# Patient Record
Sex: Female | Born: 1950 | Race: Black or African American | Hispanic: No | Smoking: Never smoker
Health system: Southern US, Community
[De-identification: ages and names within clinical notes are randomized; demographics above are authoritative.]

## PROBLEM LIST (undated history)

## (undated) DIAGNOSIS — J45909 Unspecified asthma, uncomplicated: Secondary | ICD-10-CM

## (undated) DIAGNOSIS — J309 Allergic rhinitis, unspecified: Secondary | ICD-10-CM

## (undated) DIAGNOSIS — I10 Essential (primary) hypertension: Secondary | ICD-10-CM

## (undated) DIAGNOSIS — F411 Generalized anxiety disorder: Secondary | ICD-10-CM

## (undated) DIAGNOSIS — E119 Type 2 diabetes mellitus without complications: Secondary | ICD-10-CM

## (undated) DIAGNOSIS — N959 Unspecified menopausal and perimenopausal disorder: Secondary | ICD-10-CM

## (undated) DIAGNOSIS — G4733 Obstructive sleep apnea (adult) (pediatric): Secondary | ICD-10-CM

## (undated) DIAGNOSIS — R32 Unspecified urinary incontinence: Secondary | ICD-10-CM

## (undated) DIAGNOSIS — F3289 Other specified depressive episodes: Secondary | ICD-10-CM

## (undated) DIAGNOSIS — E785 Hyperlipidemia, unspecified: Secondary | ICD-10-CM

## (undated) DIAGNOSIS — G478 Other sleep disorders: Secondary | ICD-10-CM

## (undated) DIAGNOSIS — G43909 Migraine, unspecified, not intractable, without status migrainosus: Secondary | ICD-10-CM

## (undated) DIAGNOSIS — F329 Major depressive disorder, single episode, unspecified: Secondary | ICD-10-CM

## (undated) DIAGNOSIS — N390 Urinary tract infection, site not specified: Secondary | ICD-10-CM

## (undated) DIAGNOSIS — M1990 Unspecified osteoarthritis, unspecified site: Secondary | ICD-10-CM

## (undated) DIAGNOSIS — F410 Panic disorder [episodic paroxysmal anxiety] without agoraphobia: Secondary | ICD-10-CM

## (undated) DIAGNOSIS — I509 Heart failure, unspecified: Secondary | ICD-10-CM

## (undated) HISTORY — DX: Generalized anxiety disorder: F41.1

## (undated) HISTORY — DX: Essential (primary) hypertension: I10

## (undated) HISTORY — DX: Hyperlipidemia, unspecified: E78.5

## (undated) HISTORY — DX: Major depressive disorder, single episode, unspecified: F32.9

## (undated) HISTORY — DX: Unspecified menopausal and perimenopausal disorder: N95.9

## (undated) HISTORY — PX: DIAGNOSTIC LAPAROSCOPY: SUR761

## (undated) HISTORY — DX: Unspecified osteoarthritis, unspecified site: M19.90

## (undated) HISTORY — PX: TUBAL LIGATION: SHX77

## (undated) HISTORY — DX: Migraine, unspecified, not intractable, without status migrainosus: G43.909

## (undated) HISTORY — DX: Obstructive sleep apnea (adult) (pediatric): G47.33

## (undated) HISTORY — DX: Urinary tract infection, site not specified: N39.0

## (undated) HISTORY — DX: Unspecified asthma, uncomplicated: J45.909

## (undated) HISTORY — DX: Other sleep disorders: G47.8

## (undated) HISTORY — DX: Allergic rhinitis, unspecified: J30.9

## (undated) HISTORY — PX: OPEN REDUCTION SHOULDER DISLOCATION: SUR900

## (undated) HISTORY — DX: Other specified depressive episodes: F32.89

## (undated) HISTORY — PX: TUMOR EXCISION: SHX421

## (undated) HISTORY — DX: Unspecified urinary incontinence: R32

---

## 1997-12-13 ENCOUNTER — Other Ambulatory Visit: Admission: RE | Admit: 1997-12-13 | Discharge: 1997-12-13 | Payer: Self-pay | Admitting: Internal Medicine

## 1998-03-01 ENCOUNTER — Encounter (HOSPITAL_COMMUNITY): Admission: RE | Admit: 1998-03-01 | Discharge: 1998-05-30 | Payer: Self-pay | Admitting: Neurology

## 1998-05-12 ENCOUNTER — Emergency Department (HOSPITAL_COMMUNITY): Admission: EM | Admit: 1998-05-12 | Discharge: 1998-05-12 | Payer: Self-pay | Admitting: Emergency Medicine

## 1998-05-27 ENCOUNTER — Emergency Department (HOSPITAL_COMMUNITY): Admission: EM | Admit: 1998-05-27 | Discharge: 1998-05-27 | Payer: Self-pay | Admitting: Emergency Medicine

## 1998-07-06 ENCOUNTER — Emergency Department (HOSPITAL_COMMUNITY): Admission: EM | Admit: 1998-07-06 | Discharge: 1998-07-06 | Payer: Self-pay | Admitting: Emergency Medicine

## 1998-07-28 ENCOUNTER — Emergency Department (HOSPITAL_COMMUNITY): Admission: EM | Admit: 1998-07-28 | Discharge: 1998-07-28 | Payer: Self-pay | Admitting: Emergency Medicine

## 1998-09-19 ENCOUNTER — Emergency Department (HOSPITAL_COMMUNITY): Admission: EM | Admit: 1998-09-19 | Discharge: 1998-09-19 | Payer: Self-pay | Admitting: Emergency Medicine

## 1998-10-20 ENCOUNTER — Emergency Department (HOSPITAL_COMMUNITY): Admission: EM | Admit: 1998-10-20 | Discharge: 1998-10-20 | Payer: Self-pay | Admitting: Emergency Medicine

## 1998-11-15 ENCOUNTER — Emergency Department (HOSPITAL_COMMUNITY): Admission: EM | Admit: 1998-11-15 | Discharge: 1998-11-15 | Payer: Self-pay | Admitting: Emergency Medicine

## 1998-12-23 ENCOUNTER — Emergency Department (HOSPITAL_COMMUNITY): Admission: EM | Admit: 1998-12-23 | Discharge: 1998-12-23 | Payer: Self-pay | Admitting: Emergency Medicine

## 1999-02-05 ENCOUNTER — Emergency Department (HOSPITAL_COMMUNITY): Admission: EM | Admit: 1999-02-05 | Discharge: 1999-02-05 | Payer: Self-pay | Admitting: Emergency Medicine

## 1999-02-16 ENCOUNTER — Other Ambulatory Visit: Admission: RE | Admit: 1999-02-16 | Discharge: 1999-02-16 | Payer: Self-pay | Admitting: Internal Medicine

## 1999-03-07 ENCOUNTER — Emergency Department (HOSPITAL_COMMUNITY): Admission: EM | Admit: 1999-03-07 | Discharge: 1999-03-07 | Payer: Self-pay | Admitting: Emergency Medicine

## 1999-08-11 ENCOUNTER — Emergency Department (HOSPITAL_COMMUNITY): Admission: EM | Admit: 1999-08-11 | Discharge: 1999-08-11 | Payer: Self-pay | Admitting: Emergency Medicine

## 1999-11-18 ENCOUNTER — Emergency Department (HOSPITAL_COMMUNITY): Admission: EM | Admit: 1999-11-18 | Discharge: 1999-11-18 | Payer: Self-pay | Admitting: Emergency Medicine

## 2000-02-01 ENCOUNTER — Emergency Department (HOSPITAL_COMMUNITY): Admission: EM | Admit: 2000-02-01 | Discharge: 2000-02-01 | Payer: Self-pay | Admitting: Emergency Medicine

## 2000-05-06 ENCOUNTER — Emergency Department (HOSPITAL_COMMUNITY): Admission: EM | Admit: 2000-05-06 | Discharge: 2000-05-07 | Payer: Self-pay | Admitting: Emergency Medicine

## 2000-07-01 ENCOUNTER — Emergency Department (HOSPITAL_COMMUNITY): Admission: EM | Admit: 2000-07-01 | Discharge: 2000-07-01 | Payer: Self-pay | Admitting: Emergency Medicine

## 2000-07-01 HISTORY — PX: ABDOMINAL HYSTERECTOMY: SHX81

## 2000-10-08 ENCOUNTER — Emergency Department (HOSPITAL_COMMUNITY): Admission: EM | Admit: 2000-10-08 | Discharge: 2000-10-08 | Payer: Self-pay | Admitting: Emergency Medicine

## 2001-01-08 ENCOUNTER — Encounter (INDEPENDENT_AMBULATORY_CARE_PROVIDER_SITE_OTHER): Payer: Self-pay | Admitting: *Deleted

## 2001-01-08 ENCOUNTER — Other Ambulatory Visit: Admission: RE | Admit: 2001-01-08 | Discharge: 2001-01-08 | Payer: Self-pay | Admitting: Obstetrics and Gynecology

## 2001-01-19 ENCOUNTER — Emergency Department (HOSPITAL_COMMUNITY): Admission: EM | Admit: 2001-01-19 | Discharge: 2001-01-19 | Payer: Self-pay | Admitting: Internal Medicine

## 2001-02-18 ENCOUNTER — Encounter: Payer: Self-pay | Admitting: Obstetrics and Gynecology

## 2001-02-25 ENCOUNTER — Inpatient Hospital Stay (HOSPITAL_COMMUNITY): Admission: RE | Admit: 2001-02-25 | Discharge: 2001-03-03 | Payer: Self-pay | Admitting: Obstetrics and Gynecology

## 2001-02-25 ENCOUNTER — Encounter (INDEPENDENT_AMBULATORY_CARE_PROVIDER_SITE_OTHER): Payer: Self-pay | Admitting: Specialist

## 2001-02-28 ENCOUNTER — Encounter: Payer: Self-pay | Admitting: Obstetrics and Gynecology

## 2001-03-01 ENCOUNTER — Encounter: Payer: Self-pay | Admitting: Obstetrics and Gynecology

## 2001-12-22 ENCOUNTER — Encounter: Payer: Self-pay | Admitting: Orthopedic Surgery

## 2001-12-22 ENCOUNTER — Ambulatory Visit (HOSPITAL_COMMUNITY): Admission: RE | Admit: 2001-12-22 | Discharge: 2001-12-22 | Payer: Self-pay | Admitting: Orthopedic Surgery

## 2002-02-11 ENCOUNTER — Emergency Department (HOSPITAL_COMMUNITY): Admission: EM | Admit: 2002-02-11 | Discharge: 2002-02-11 | Payer: Self-pay | Admitting: Emergency Medicine

## 2002-03-13 ENCOUNTER — Emergency Department (HOSPITAL_COMMUNITY): Admission: EM | Admit: 2002-03-13 | Discharge: 2002-03-13 | Payer: Self-pay | Admitting: Emergency Medicine

## 2002-03-16 ENCOUNTER — Ambulatory Visit (HOSPITAL_BASED_OUTPATIENT_CLINIC_OR_DEPARTMENT_OTHER): Admission: RE | Admit: 2002-03-16 | Discharge: 2002-03-17 | Payer: Self-pay | Admitting: Orthopedic Surgery

## 2002-03-16 ENCOUNTER — Encounter (INDEPENDENT_AMBULATORY_CARE_PROVIDER_SITE_OTHER): Payer: Self-pay | Admitting: *Deleted

## 2002-06-19 ENCOUNTER — Emergency Department (HOSPITAL_COMMUNITY): Admission: EM | Admit: 2002-06-19 | Discharge: 2002-06-19 | Payer: Self-pay | Admitting: *Deleted

## 2002-06-22 ENCOUNTER — Other Ambulatory Visit: Admission: RE | Admit: 2002-06-22 | Discharge: 2002-06-22 | Payer: Self-pay | Admitting: Obstetrics and Gynecology

## 2002-07-06 ENCOUNTER — Encounter: Payer: Self-pay | Admitting: Obstetrics and Gynecology

## 2002-07-06 ENCOUNTER — Encounter: Admission: RE | Admit: 2002-07-06 | Discharge: 2002-07-06 | Payer: Self-pay | Admitting: Internal Medicine

## 2002-08-26 ENCOUNTER — Inpatient Hospital Stay (HOSPITAL_COMMUNITY): Admission: EM | Admit: 2002-08-26 | Discharge: 2002-08-28 | Payer: Self-pay | Admitting: Emergency Medicine

## 2002-08-26 ENCOUNTER — Encounter: Payer: Self-pay | Admitting: Internal Medicine

## 2002-08-26 DIAGNOSIS — Z9189 Other specified personal risk factors, not elsewhere classified: Secondary | ICD-10-CM | POA: Insufficient documentation

## 2003-02-14 ENCOUNTER — Emergency Department (HOSPITAL_COMMUNITY): Admission: EM | Admit: 2003-02-14 | Discharge: 2003-02-14 | Payer: Self-pay | Admitting: *Deleted

## 2003-04-25 ENCOUNTER — Encounter: Payer: Self-pay | Admitting: *Deleted

## 2003-04-25 ENCOUNTER — Emergency Department (HOSPITAL_COMMUNITY): Admission: AD | Admit: 2003-04-25 | Discharge: 2003-04-25 | Payer: Self-pay | Admitting: Emergency Medicine

## 2003-11-03 ENCOUNTER — Emergency Department (HOSPITAL_COMMUNITY): Admission: EM | Admit: 2003-11-03 | Discharge: 2003-11-03 | Payer: Self-pay | Admitting: Emergency Medicine

## 2004-05-11 ENCOUNTER — Emergency Department (HOSPITAL_COMMUNITY): Admission: EM | Admit: 2004-05-11 | Discharge: 2004-05-11 | Payer: Self-pay | Admitting: *Deleted

## 2004-07-12 ENCOUNTER — Emergency Department (HOSPITAL_COMMUNITY): Admission: EM | Admit: 2004-07-12 | Discharge: 2004-07-12 | Payer: Self-pay | Admitting: Emergency Medicine

## 2004-09-02 ENCOUNTER — Emergency Department (HOSPITAL_COMMUNITY): Admission: EM | Admit: 2004-09-02 | Discharge: 2004-09-02 | Payer: Self-pay | Admitting: Emergency Medicine

## 2004-11-09 ENCOUNTER — Emergency Department (HOSPITAL_COMMUNITY): Admission: EM | Admit: 2004-11-09 | Discharge: 2004-11-09 | Payer: Self-pay | Admitting: Emergency Medicine

## 2005-02-15 ENCOUNTER — Encounter: Admission: RE | Admit: 2005-02-15 | Discharge: 2005-02-15 | Payer: Self-pay | Admitting: Internal Medicine

## 2005-02-15 ENCOUNTER — Emergency Department (HOSPITAL_COMMUNITY): Admission: EM | Admit: 2005-02-15 | Discharge: 2005-02-15 | Payer: Self-pay | Admitting: Emergency Medicine

## 2005-03-12 ENCOUNTER — Inpatient Hospital Stay (HOSPITAL_COMMUNITY): Admission: AD | Admit: 2005-03-12 | Discharge: 2005-03-15 | Payer: Self-pay | Admitting: Internal Medicine

## 2005-05-14 ENCOUNTER — Emergency Department (HOSPITAL_COMMUNITY): Admission: EM | Admit: 2005-05-14 | Discharge: 2005-05-14 | Payer: Self-pay | Admitting: Emergency Medicine

## 2005-07-26 ENCOUNTER — Emergency Department (HOSPITAL_COMMUNITY): Admission: EM | Admit: 2005-07-26 | Discharge: 2005-07-26 | Payer: Self-pay | Admitting: Emergency Medicine

## 2005-12-21 ENCOUNTER — Emergency Department (HOSPITAL_COMMUNITY): Admission: EM | Admit: 2005-12-21 | Discharge: 2005-12-21 | Payer: Self-pay | Admitting: Emergency Medicine

## 2006-08-17 ENCOUNTER — Emergency Department (HOSPITAL_COMMUNITY): Admission: EM | Admit: 2006-08-17 | Discharge: 2006-08-17 | Payer: Self-pay | Admitting: Emergency Medicine

## 2006-10-03 ENCOUNTER — Emergency Department (HOSPITAL_COMMUNITY): Admission: EM | Admit: 2006-10-03 | Discharge: 2006-10-03 | Payer: Self-pay | Admitting: Emergency Medicine

## 2009-09-25 ENCOUNTER — Encounter: Payer: Self-pay | Admitting: Internal Medicine

## 2009-09-25 ENCOUNTER — Emergency Department (HOSPITAL_COMMUNITY)
Admission: EM | Admit: 2009-09-25 | Discharge: 2009-09-25 | Payer: Self-pay | Source: Home / Self Care | Admitting: Emergency Medicine

## 2009-09-25 LAB — CONVERTED CEMR LAB
ALT: 20 units/L
AST: 18 units/L
Albumin: 4.3 g/dL
Alkaline Phosphatase: 106 units/L
BUN: 10 mg/dL
CO2: 25 meq/L
Calcium: 10 mg/dL
Chloride: 102 meq/L
Creatinine, Ser: 0.81 mg/dL
Glucose, Bld: 133 mg/dL
HCT: 39 %
Hemoglobin: 13.4 g/dL
MCV: 89 fL
Platelets: 345 10*3/uL
Potassium: 3.2 meq/L
RBC: 4.38 M/uL
Sodium: 138 meq/L
Total Bilirubin: 0.7 mg/dL
Total Protein: 9.2 g/dL
WBC: 13.6 10*3/uL

## 2010-01-25 ENCOUNTER — Encounter: Payer: Self-pay | Admitting: Internal Medicine

## 2010-01-25 LAB — CONVERTED CEMR LAB
ALT: 12 units/L
AST: 17 units/L
Albumin: 4.4 g/dL
Alkaline Phosphatase: 101 units/L
BUN: 22 mg/dL
CO2: 22 meq/L
Chloride: 98 meq/L
Cholesterol: 180 mg/dL
Creatinine, Ser: 1.16 mg/dL
Glucose, Bld: 106 mg/dL
HCT: 34.5 %
HDL: 38 mg/dL
Hemoglobin: 11.7 g/dL
Platelets: 416 10*3/uL
Potassium: 3.8 meq/L
Sodium: 137 meq/L
TSH: 2.14 microintl units/mL
Total Bilirubin: 0.3 mg/dL
Triglyceride fasting, serum: 558 mg/dL

## 2010-06-14 ENCOUNTER — Encounter: Payer: Self-pay | Admitting: Internal Medicine

## 2010-06-14 DIAGNOSIS — I10 Essential (primary) hypertension: Secondary | ICD-10-CM | POA: Insufficient documentation

## 2010-06-14 DIAGNOSIS — F329 Major depressive disorder, single episode, unspecified: Secondary | ICD-10-CM | POA: Insufficient documentation

## 2010-06-14 DIAGNOSIS — G43909 Migraine, unspecified, not intractable, without status migrainosus: Secondary | ICD-10-CM | POA: Insufficient documentation

## 2010-06-14 DIAGNOSIS — F411 Generalized anxiety disorder: Secondary | ICD-10-CM | POA: Insufficient documentation

## 2010-06-18 ENCOUNTER — Ambulatory Visit: Payer: Self-pay | Admitting: Internal Medicine

## 2010-06-18 DIAGNOSIS — Z9189 Other specified personal risk factors, not elsewhere classified: Secondary | ICD-10-CM | POA: Insufficient documentation

## 2010-06-18 DIAGNOSIS — R519 Headache, unspecified: Secondary | ICD-10-CM | POA: Insufficient documentation

## 2010-06-18 DIAGNOSIS — R32 Unspecified urinary incontinence: Secondary | ICD-10-CM | POA: Insufficient documentation

## 2010-06-18 DIAGNOSIS — Z87448 Personal history of other diseases of urinary system: Secondary | ICD-10-CM | POA: Insufficient documentation

## 2010-06-18 DIAGNOSIS — E785 Hyperlipidemia, unspecified: Secondary | ICD-10-CM | POA: Insufficient documentation

## 2010-06-18 DIAGNOSIS — R51 Headache: Secondary | ICD-10-CM | POA: Insufficient documentation

## 2010-06-18 DIAGNOSIS — M129 Arthropathy, unspecified: Secondary | ICD-10-CM | POA: Insufficient documentation

## 2010-07-09 ENCOUNTER — Encounter (INDEPENDENT_AMBULATORY_CARE_PROVIDER_SITE_OTHER): Payer: Self-pay | Admitting: *Deleted

## 2010-07-23 ENCOUNTER — Ambulatory Visit
Admission: RE | Admit: 2010-07-23 | Discharge: 2010-07-23 | Payer: Self-pay | Source: Home / Self Care | Attending: Internal Medicine | Admitting: Internal Medicine

## 2010-08-02 NOTE — Letter (Signed)
Summary: Referral - not able to see patient  [facility]  [address] [place], [place] [address]   Phone: [phone]  Fax: [phone]    July 09, 2010   Rene Paci, MD [address] [place], [place] [address]   Re:   Gloria [name] DOB:  Feb 27, 1951 MRN:   [id number]    Dear Dr. Felicity Coyer:  Thank you for your kind referral of the above patient.  We have attempted to schedule the recommended procedure Screening Colonoscopy but have not been able to schedule because:  ___ The patient was not available by phone and/or has not returned our calls.  X   The patient declined to schedule the procedure at this time.  We appreciate the referral and hope that we will have the opportunity to treat this patient in the future.    Sincerely,    [facility] [phone]

## 2010-08-02 NOTE — Assessment & Plan Note (Signed)
Summary: 4-6 WK ROV/NWS   Vital Signs:  Patient profile:   60 year old female Height:      67 inches (170.18 cm) Weight:      204.50 pounds (92.95 kg) BMI:     32.15 O2 Sat:      98 % on Room air Temp:     98.8 degrees F (37.11 degrees C) oral Pulse rate:   100 / minute BP sitting:   122 / 78  (left arm) Cuff size:   large  Vitals Entered By: Brenton Grills CMA [name] Dull) (July 23, 2010 11:07 AM)  O2 Flow:  Room air CC: Follow-up visit/anxiety attacks/pt states she never started Sertraline/aj Is Patient Diabetic? No   Primary Care Provider:  Newt Lukes MD  CC:  Follow-up visit/anxiety attacks/pt states she never started Sertraline/aj.  History of Present Illness: here for f/u  1) HTN - resumed med tx 05/2010 (prev noncompl due to $ restraints) -reports compliance with ongoing medical treatment and no changes in medication dose or frequency. denies adverse side effects related to current therapy.  no CP, HA or edema -   2) anxiety - uses daily BZ for same, no other daily med rx'd - prev on wellbutrin and paxil but ineffective -  "i'm not crazy, just stressed"; +painic attacks and phobias; denies depression - denies SI/HI - has been to counseling at [facility] - has chosen not to start sertraline and unable to waen off BZ  3) migraines - exac by stress and anxiety - uses occ hydrocodone for control of same -   4) dyslipidemia - never rx'd med for same but advised low fat diet - not following diet or exercise paln   Clinical Review Panels:  CBC   WBC:  13.6 (09/25/2009)   RBC:  4.38 (09/25/2009)   Hgb:  13.4 (09/25/2009)   Hct:  39.0 (09/25/2009)   Platelets:  345 (09/25/2009)   MCV  89.0 (09/25/2009)  Complete Metabolic Panel   Glucose:  133 (09/25/2009)   Sodium:  138 (09/25/2009)   Potassium:  3.2 (09/25/2009)   Chloride:  102 (09/25/2009)   CO2:  25 (09/25/2009)   BUN:  10 (09/25/2009)   Creatinine:  0.81 (09/25/2009)   Albumin:  4.3 (09/25/2009)   Total Protein:  9.2 (09/25/2009)   Calcium:  10.0 (09/25/2009)   Total Bili:  0.7 (09/25/2009)   Alk Phos:  106 (09/25/2009)   SGPT (ALT):  20 (09/25/2009)   SGOT (AST):  18 (09/25/2009)   Current Medications (verified): 1)  Alprazolam 1 Mg Tabs (Alprazolam) .... Take 1-2 By Mouth Once Daily As Needed 2)  Hydrocodone-Acetaminophen 5-500 Mg Tabs (Hydrocodone-Acetaminophen) .... Take 1-2 By Mouth Once Daily As Needed 3)  Lisinopril-Hydrochlorothiazide 20-25 Mg Tabs (Lisinopril-Hydrochlorothiazide) .Marland Kitchen.. 1 By Mouth Once Daily 4)  Sertraline Hcl 25 Mg Tabs (Sertraline Hcl) .Marland Kitchen.. 1 By Mouth Once Daily  Allergies (verified): 1)  ! Codeine  Past History:  Past Medical History: Anxiety/Depression Hypertension Hyperlipidemia  Review of Systems  The patient denies peripheral edema, headaches, abdominal pain, and depression.    Physical Exam  General:  overweight-appearing.  alert, well-developed, well-nourished, and cooperative to examination.    Lungs:  normal respiratory effort, no intercostal retractions or use of accessory muscles; normal breath sounds bilaterally - no crackles and no wheezes.    Heart:  normal rate, regular rhythm, no murmur, and no rub. BLE without edema. normal DP pulses and normal cap refill in all 4 extremities  Psych:  Oriented X3, memory intact for recent and remote, normally interactive, good eye contact, mod anxious appearing, mildly depressed appearing, and not agitated.      Impression & Recommendations:  Problem # 1:  ANXIETY (ICD-300.00)  The following medications were removed from the medication list:    Sertraline Hcl 25 Mg Tabs (Sertraline hcl) .Marland Kitchen... 1 by mouth once daily Her updated medication list for this problem includes:    Alprazolam 1 Mg Tabs (Alprazolam) .Marland Kitchen... Take 1 by mouth three times a day as needed for anxiety and panic symptoms  pt wants to work on reduction of BZ use, but unable to do so at this time due to stressors prev  advised 2x/d as needed - then future 1x/d to help wean off same- but cont 3x/day at this time continue counseling as she has begun at [facility] - declines to start daily SSRI as sertraline rx'd 05/2010 after risk/benefits discussed -  controlled subst contract signed 05/2010  Problem # 2:  HYPERTENSION (ICD-401.9)  Her updated medication list for this problem includes:    Lisinopril-hydrochlorothiazide 20-25 Mg Tabs (Lisinopril-hydrochlorothiazide) .Marland Kitchen... 1 by mouth once daily  started $4 med 05/2010 including diuretic as pt concerned about "water weight" though no edema on exam - improved - cont same  BP today: 122/78 Prior BP: 142/82 (06/18/2010)  Labs Reviewed: K+: 3.2 (09/25/2009) Creat: : 0.81 (09/25/2009)     Complete Medication List: 1)  Alprazolam 1 Mg Tabs (Alprazolam) .... Take 1 by mouth three times a day as needed for anxiety and panic symptoms 2)  Hydrocodone-acetaminophen 5-500 Mg Tabs (Hydrocodone-acetaminophen) .... Take 1-2 by mouth once daily as needed 3)  Lisinopril-hydrochlorothiazide 20-25 Mg Tabs (Lisinopril-hydrochlorothiazide) .Marland Kitchen.. 1 by mouth once daily  Patient Instructions: 1)  it was good to see you today. 2)  continue $4 medications for blood pressure as discussed - see below - your refillss have been electronically submitted to your pharmacy ([facility]). Please take as directed. Contact our office if you believe you're having problems with the medication(s).  3)  also refills on alprazolam as dicsussed - ok to continue #90/month for now but use only 2/day if able -  4)  Please schedule a follow-up appointment in 3 months to recheck blood pressure and anxiety symptoms/meds, call sooner if problems.  5)  we'll make referral for colonoscopy and mammogram. Our office will contact you regarding this appointment once made.  Prescriptions: ALPRAZOLAM 1 MG TABS (ALPRAZOLAM) take 1 by mouth three times a day as needed for anxiety and panic symptoms   #90 x 3   Entered and Authorized by:   Newt Lukes MD   Signed by:   Newt Lukes MD on 07/23/2010   Method used:   Print then Give to Patient   RxID:   [id number]    Orders Added: 1)  Est. Patient Level III [14782]

## 2010-08-02 NOTE — Miscellaneous (Signed)
Summary: Controlled Substances Contract/[facility]  Controlled Substances Contract/[facility]   Imported By: Sherian Rein 06/20/2010 10:26:47  _____________________________________________________________________  External Attachment:    Type:   Image     Comment:   External Document

## 2010-08-02 NOTE — Assessment & Plan Note (Signed)
Summary: New / 100 % [facility] / # cd   Vital Signs:  Patient profile:   60 year old female Height:      67 inches (170.18 cm) Weight:      209.0 pounds (95.00 kg) BMI:     32.85 O2 Sat:      98 % on Room air Temp:     98.2 degrees F (36.78 degrees C) oral Pulse rate:   83 / minute BP sitting:   142 / 82  (left arm) Cuff size:   large  Vitals Entered By: Orlan Leavens RMA (June 18, 2010 8:22 AM)  O2 Flow:  Room air CC: New patient Is Patient Diabetic? No Pain Assessment Patient in pain? no      Comments Pt states she stop BP med Diovan 320/? cost was too expensive ($175.00). Want  something cheaper. Also c/o weight gain   Primary Care Provider:  Newt Lukes MD  CC:  New patient.  History of Present Illness: new pt to me and our practice, here to est care prev followed with dr. Selena Batten [place]  1) HTN - not taking rx'd meds due to cost restraints - no CP, HA or edema -   2) anxiety - uses daily BZ for same, no other daily med rx'd - prev on wellbutrin and paxil but ineffective -  "i'm not crazy, just stressed"; +painic attacks and phobias; denies depression - denies SI/HI - not in counseling and not interested at this time  3) migraines - exac by stress and anxiety - uses occ hydrocodone for control of same -   4) dyslipideia - never rx'd med for same but advised low fat diet - not following diet or exercise paln   Preventive Screening-Counseling & Management  Alcohol-Tobacco     Alcohol drinks/day: 0     Alcohol Counseling: not indicated; patient does not drink     Smoking Status: never     Tobacco Counseling: not indicated; no tobacco use  Caffeine-Diet-Exercise     Does Patient Exercise: no     Exercise Counseling: to improve exercise regimen  Safety-Violence-Falls     Helmet Counseling: not indicated; patient wears helmet when riding bicycle/motocycle     Firearms in the Home: no firearms in the home     Firearm Counseling: not applicable  Fall Risk Counseling: not indicated; no significant falls noted  Clinical Review Panels:  CBC   WBC:  13.6 (09/25/2009)   RBC:  4.38 (09/25/2009)   Hgb:  13.4 (09/25/2009)   Hct:  39.0 (09/25/2009)   Platelets:  345 (09/25/2009)   MCV  89.0 (09/25/2009)  Complete Metabolic Panel   Glucose:  133 (09/25/2009)   Sodium:  138 (09/25/2009)   Potassium:  3.2 (09/25/2009)   Chloride:  102 (09/25/2009)   CO2:  25 (09/25/2009)   BUN:  10 (09/25/2009)   Creatinine:  0.81 (09/25/2009)   Albumin:  4.3 (09/25/2009)   Total Protein:  9.2 (09/25/2009)   Calcium:  10.0 (09/25/2009)   Total Bili:  0.7 (09/25/2009)   Alk Phos:  106 (09/25/2009)   SGPT (ALT):  20 (09/25/2009)   SGOT (AST):  18 (09/25/2009)   Current Medications (verified): 1)  Alprazolam 1 Mg Tabs (Alprazolam) .... Take 1-2 By Mouth Once Daily As Needed 2)  Hydrocodone-Acetaminophen 5-500 Mg Tabs (Hydrocodone-Acetaminophen) .... Take 1-2 By Mouth Once Daily As Needed  Allergies (verified): 1)  ! Codeine  Past History:  Past Medical History: Anxiety/Depression Hypertension Hyperlipidemia  Past Surgical History: Hysterectomy (2002)    Family History: Family History of Arthritis (paremt,grandparent, other relative) Family History of Colon CA 1st degree relative <60 (grandparent) Family History Diabetes 1st degree relative (grandparent, other realtive) Family History Hypertension (grandparent)  Social History: Never Smoked, no alcohol divorced, lives alone - grown 27yo son  self employed - Producer, television/film/video arrangements Smoking Status:  never Does Patient Exercise:  no  Review of Systems       The patient complains of weight gain.  The patient denies fever, chest pain, syncope, peripheral edema, abdominal pain, melena, hematochezia, and severe indigestion/heartburn.         also see HPI above. I have reviewed all other systems and they were negative.   Physical Exam  General:  overweight-appearing.   alert, well-developed, well-nourished, and cooperative to examination.    Head:  Normocephalic and atraumatic without obvious abnormalities. No apparent alopecia or balding. Eyes:  wears glasses, vision grossly intact; pupils equal, round and reactive to light.  conjunctiva and lids normal.    Lungs:  normal respiratory effort, no intercostal retractions or use of accessory muscles; normal breath sounds bilaterally - no crackles and no wheezes.    Heart:  normal rate, regular rhythm, no murmur, and no rub. BLE without edema. normal DP pulses and normal cap refill in all 4 extremities    Abdomen:  soft, non-tender, normal bowel sounds, no distention; no masses and no appreciable hepatomegaly or splenomegaly.   Msk:  No deformity or scoliosis noted of thoracic or lumbar spine.   Neurologic:  alert & oriented X3 and cranial nerves II-XII symetrically intact.  strength normal in all extremities, sensation intact to light touch, and gait normal. speech fluent without dysarthria or aphasia; follows commands with good comprehension.  Skin:  no rashes, vesicles, ulcers, or erythema. No nodules or irregularity to palpation.  Psych:  Oriented X3, memory intact for recent and remote, normally interactive, good eye contact, mod anxious appearing, mildly depressed appearing, and not agitated.      Impression & Recommendations:  Problem # 1:  ANXIETY (ICD-300.00)  pt working on reduction of BZ use - advised 2x/d as needed - then future 1x/d to help wean off same- start daily SSRI to help - pt agrees to trial sertraline after risk/benefits discussed - erx done f/u 4-6 weeks, sooner if probs - controlled subst contract signed today Her updated medication list for this problem includes:    Alprazolam 1 Mg Tabs (Alprazolam) .Marland Kitchen... Take 1-2 by mouth once daily as needed    Sertraline Hcl 25 Mg Tabs (Sertraline hcl) .Marland Kitchen... 1 by mouth once daily  Orders: Prescription Created Electronically [phone])  Problem # 2:   HYPERTENSION (ICD-401.9)  start $4 med including diuretic as pt concerned about "water weight" though no edema on exam - send for and review prior labs/records Her updated medication list for this problem includes:    Lisinopril-hydrochlorothiazide 20-25 Mg Tabs (Lisinopril-hydrochlorothiazide) .Marland Kitchen... 1 by mouth once daily  BP today: 142/82  Labs Reviewed: K+: 3.2 (09/25/2009) Creat: : 0.81 (09/25/2009)     Orders: Prescription Created Electronically [phone])  Problem # 3:  HYPERLIPIDEMIA (ICD-272.4) never on rx med - review prior records once reviewed rec inc exercise and diet efforts for control of same as prev advised  Problem # 4:  MIGRAINE HEADACHE (ICD-346.90) reviewed [place] cont subst registry - will not give >30/month and encouraged to use less if able Her updated medication list for this problem includes:  Hydrocodone-acetaminophen 5-500 Mg Tabs (Hydrocodone-acetaminophen) .Marland Kitchen... Take 1-2 by mouth once daily as needed  Complete Medication List: 1)  Alprazolam 1 Mg Tabs (Alprazolam) .... Take 1-2 by mouth once daily as needed 2)  Hydrocodone-acetaminophen 5-500 Mg Tabs (Hydrocodone-acetaminophen) .... Take 1-2 by mouth once daily as needed 3)  Lisinopril-hydrochlorothiazide 20-25 Mg Tabs (Lisinopril-hydrochlorothiazide) .Marland Kitchen.. 1 by mouth once daily 4)  Sertraline Hcl 25 Mg Tabs (Sertraline hcl) .Marland Kitchen.. 1 by mouth once daily  Other Orders: Gastroenterology Referral (GI) Misc. Referral (Misc. Ref)  Patient Instructions: 1)  it was good to see you today. 2)  will send for records from dr. Renae Gloss to review as discussed 3)  start new $4 medications for blood pressure and depression/anxiety as discussed - see below - your prescriptions have been electronically submitted to your pharmacy ([facility]). Please take as directed. Contact our office if you believe you're having problems with the medication(s).  4)  also refills on alprazolam and hydrocodoenas dicsussed -  controlled substance contract signed today where you have agreed not to call for early refills or lost med/presciptions, etc - 5)  Please schedule a follow-up appointment in 4-6 weeks to recheck blood pressure and depression symptoms/meds, call sooner if problems.  6)  we'll make referral for colonoscopy and mammogram. Our office will contact you regarding this appointment once made.  Prescriptions: HYDROCODONE-ACETAMINOPHEN 5-500 MG TABS (HYDROCODONE-ACETAMINOPHEN) take 1-2 by mouth once daily as needed  #30 x 2   Entered and Authorized by:   Newt Lukes MD   Signed by:   Newt Lukes MD on 06/18/2010   Method used:   Printed then faxed to ...       [facility] [address]  [phone]* (retail)       [address]       [place], [place]  [address]       Ph: [phone] or [phone]       Fax: [phone]   RxID:   [id number] ALPRAZOLAM 1 MG TABS (ALPRAZOLAM) take 1-2 by mouth once daily as needed  #60 x 2   Entered and Authorized by:   Newt Lukes MD   Signed by:   Newt Lukes MD on 06/18/2010   Method used:   Printed then faxed to ...       [facility] [address]  [phone]* (retail)       [address]       [place], [place]  [address]       Ph: [phone] or [phone]       Fax: [phone]   RxID:   [id number] SERTRALINE HCL 25 MG TABS (SERTRALINE HCL) 1 by mouth once daily  #30 x 3   Entered and Authorized by:   Newt Lukes MD   Signed by:   Newt Lukes MD on 06/18/2010   Method used:   Electronically to        [facility]  [phone]* (retail)       [address]       [place], [place]  [address]       Ph: [phone] or [phone]       Fax: [phone]   RxID:   [id number] LISINOPRIL-HYDROCHLOROTHIAZIDE 20-25 MG TABS (LISINOPRIL-HYDROCHLOROTHIAZIDE) 1 by mouth once daily  #30 x 3   Entered and Authorized by:   Vikki Ports  Edsel Petrin  MD   Signed by:   Newt Lukes MD on 06/18/2010   Method used:   Electronically to        [facility]  [phone]* (retail)       [address]       [place], [place]  [address]       Ph: [phone] or [phone]       Fax: [phone]   RxID:   [id number]    Orders Added: 1)  New Patient Level IV [43329] 2)  Prescription Created Electronically [G8553] 3)  Gastroenterology Referral [GI] 4)  Misc. Referral [Misc. Ref]

## 2010-08-14 ENCOUNTER — Encounter: Payer: Self-pay | Admitting: Internal Medicine

## 2010-08-22 NOTE — Letter (Signed)
Summary: [facility]  [facility]   Imported By: Sherian Rein 08/16/2010 12:50:33  _____________________________________________________________________  External Attachment:    Type:   Image     Comment:   External Document

## 2010-08-24 ENCOUNTER — Other Ambulatory Visit: Payer: Self-pay | Admitting: Internal Medicine

## 2010-08-24 DIAGNOSIS — Z1231 Encounter for screening mammogram for malignant neoplasm of breast: Secondary | ICD-10-CM

## 2010-09-18 ENCOUNTER — Other Ambulatory Visit: Payer: Self-pay | Admitting: Internal Medicine

## 2010-09-19 ENCOUNTER — Telehealth: Payer: Self-pay | Admitting: *Deleted

## 2010-09-19 NOTE — Telephone Encounter (Signed)
Refill previously addressed 09/17/10 - no additional refill at this time

## 2010-09-21 LAB — RAPID URINE DRUG SCREEN, HOSP PERFORMED
Amphetamines: NOT DETECTED
Barbiturates: NOT DETECTED
Benzodiazepines: POSITIVE — AB
Cocaine: NOT DETECTED
Opiates: NOT DETECTED
Tetrahydrocannabinol: NOT DETECTED

## 2010-09-21 LAB — COMPREHENSIVE METABOLIC PANEL
ALT: 20 U/L (ref 0–35)
AST: 18 U/L (ref 0–37)
Albumin: 4.3 g/dL (ref 3.5–5.2)
Alkaline Phosphatase: 106 U/L (ref 39–117)
BUN: 10 mg/dL (ref 6–23)
CO2: 25 mEq/L (ref 19–32)
Calcium: 10 mg/dL (ref 8.4–10.5)
Chloride: 102 mEq/L (ref 96–112)
Creatinine, Ser: 0.81 mg/dL (ref 0.4–1.2)
GFR calc Af Amer: >60 mL/min (ref >60)
GFR calc non Af Amer: >60 mL/min (ref >60)
Glucose, Bld: 133 mg/dL — ABNORMAL HIGH (ref 70–99)
Potassium: 3.2 mEq/L — ABNORMAL LOW (ref 3.5–5.1)
Sodium: 138 mEq/L (ref 135–145)
Total Bilirubin: 0.7 mg/dL (ref 0.3–1.2)
Total Protein: 9.2 g/dL — ABNORMAL HIGH (ref 6.0–8.3)

## 2010-09-21 LAB — CK TOTAL AND CKMB (NOT AT ARMC)
CK, MB: 1.2 ng/mL (ref 0.3–4.0)
Relative Index: 1.1 (ref 0.0–2.5)
Total CK: 112 U/L (ref 7–177)

## 2010-09-21 LAB — CBC
HCT: 39 % (ref 36.0–46.0)
Hemoglobin: 13.4 g/dL (ref 12.0–15.0)
MCHC: 34.4 g/dL (ref 30.0–36.0)
MCV: 89 fL (ref 78.0–100.0)
Platelets: 345 10*3/uL (ref 150–400)
RBC: 4.38 MIL/uL (ref 3.87–5.11)
RDW: 14.2 % (ref 11.5–15.5)
WBC: 13.6 10*3/uL — ABNORMAL HIGH (ref 4.0–10.5)

## 2010-09-21 LAB — TROPONIN I: Troponin I: <0.01 ng/mL (ref 0.00–0.06)

## 2010-09-21 LAB — ETHANOL: Alcohol, Ethyl (B): <5 mg/dL (ref 0–10)

## 2010-09-29 ENCOUNTER — Ambulatory Visit (INDEPENDENT_AMBULATORY_CARE_PROVIDER_SITE_OTHER): Payer: Self-pay | Admitting: Family Medicine

## 2010-09-29 ENCOUNTER — Encounter: Payer: Self-pay | Admitting: Family Medicine

## 2010-09-29 ENCOUNTER — Encounter: Payer: Self-pay | Admitting: Internal Medicine

## 2010-09-29 VITALS — BP 122/74 | HR 74 | Temp 97.8°F | Wt 210.1 lb

## 2010-09-29 DIAGNOSIS — J209 Acute bronchitis, unspecified: Secondary | ICD-10-CM

## 2010-09-29 MED ORDER — HYDROCODONE-HOMATROPINE 5-1.5 MG/5ML PO SYRP: 5.0000 mL | ORAL_SOLUTION | Freq: Every evening | ORAL | Status: DC | PRN

## 2010-09-29 MED ORDER — AZITHROMYCIN 250 MG PO TABS: 250.0000 mg | ORAL_TABLET | Freq: Every day | ORAL | Status: DC

## 2010-09-29 NOTE — Progress Notes (Signed)
  Subjective:    Patient ID: Gloria [name], female    DOB: [birth year], 60 y.o.   MRN: [id number]  Cough This is a new problem. The current episode started in the past 7 days. The problem has been rapidly worsening. The problem occurs constantly. The cough is productive of purulent sputum. Associated symptoms include nasal congestion and postnasal drip. Pertinent negatives include no chest pain, ear pain, fever, headaches, hemoptysis, rash, sore throat, shortness of breath or wheezing. Associated symptoms comments: Ear itching  Fatigue. The symptoms are aggravated by nothing. Treatments tried: mucinex D and allegra. The treatment provided mild relief. Her past medical history is significant for environmental allergies. There is no history of asthma or COPD.      Review of Systems  Constitutional: Positive for fatigue. Negative for fever.  HENT: Positive for postnasal drip. Negative for ear pain and sore throat.   Respiratory: Positive for cough. Negative for hemoptysis, shortness of breath and wheezing.   Cardiovascular: Negative for chest pain and leg swelling.  Gastrointestinal: Negative for abdominal pain.  Genitourinary: Negative for dysuria.  Skin: Negative for rash.  Neurological: Negative for headaches.  Hematological: Positive for environmental allergies.       Objective:   Physical Exam  Constitutional: Vital signs are normal. She appears well-developed and well-nourished. She appears lethargic.       Fatigued appearing lying on table when exam began.  HENT:  Head: Normocephalic.  Eyes: Conjunctivae, EOM and lids are normal. Pupils are equal, round, and reactive to light.  Neck: Trachea normal. No mass and no thyromegaly present.  Cardiovascular: Normal rate, regular rhythm, normal heart sounds, intact distal pulses and normal pulses.  Exam reveals no gallop and no friction rub.   No murmur heard. Pulmonary/Chest: Effort normal and breath sounds normal. She has no decreased  breath sounds. She has no wheezes. She has no rhonchi. She has no rales.  Neurological: She appears lethargic.  Skin: Skin is warm and dry. No rash noted.          Assessment & Plan:

## 2010-09-29 NOTE — Assessment & Plan Note (Signed)
No respiratory distress, but given worsening symptoms past 1 week.. Will treat with antibiotics. Azithromycin, cough suppressant. Push fluids, rest.  Follow up if not improving in 48-72 hours...expect cough to last 5-7 more days. Go to ER if severe SOB.

## 2010-09-29 NOTE — Patient Instructions (Signed)
Start azithromycin daily, cough suppressant at night. Mucinex DM during the day.   Push fluids, rest.  Follow up if not improving in 48-72 hours...expect cough to last 5-7 more days. Go to ER if severe SOB.

## 2010-10-04 ENCOUNTER — Encounter: Payer: Self-pay | Admitting: Internal Medicine

## 2010-10-05 ENCOUNTER — Ambulatory Visit (INDEPENDENT_AMBULATORY_CARE_PROVIDER_SITE_OTHER): Payer: Self-pay | Admitting: Family Medicine

## 2010-10-05 ENCOUNTER — Encounter: Payer: Self-pay | Admitting: Family Medicine

## 2010-10-05 DIAGNOSIS — J209 Acute bronchitis, unspecified: Secondary | ICD-10-CM

## 2010-10-05 DIAGNOSIS — I10 Essential (primary) hypertension: Secondary | ICD-10-CM

## 2010-10-05 DIAGNOSIS — M1990 Unspecified osteoarthritis, unspecified site: Secondary | ICD-10-CM

## 2010-10-05 DIAGNOSIS — M129 Arthropathy, unspecified: Secondary | ICD-10-CM

## 2010-10-05 MED ORDER — SULFAMETHOXAZOLE-TRIMETHOPRIM 800-160 MG PO TABS: 1.0000 | ORAL_TABLET | Freq: Two times a day (BID) | ORAL | Status: AC

## 2010-10-05 MED ORDER — HYDROCOD POLST-CHLORPHEN POLST 10-8 MG/5ML PO LQCR: ORAL | Status: DC

## 2010-10-05 MED ORDER — ALBUTEROL SULFATE (2.5 MG/3ML) 0.083% IN NEBU: 2.5000 mg | INHALATION_SOLUTION | RESPIRATORY_TRACT | Status: DC | PRN

## 2010-10-05 MED ORDER — HYDROCODONE-ACETAMINOPHEN 5-500 MG PO TABS: 1.0000 | ORAL_TABLET | Freq: Every day | ORAL | Status: DC | PRN

## 2010-10-05 NOTE — Patient Instructions (Signed)
Acute Bronchitis Bronchitis is a problem of the air tubes leading to your lungs. Acute means the illness started quickly. In this condition, the lining of those tubes becomes puffy (swollen) and can leak fluid. This makes it harder for air to get in and out of your lungs. You may cough a lot. This is because the air tubes are narrow. Bronchitis is most often caused by a virus. Medicines that kill germs (antibiotics) may be needed with germ (bacteria) infections for people who:  Smoke.   Have lasting (chronic) lung problems.   Are elderly.  HOME CARE  Rest.   Drink enough water and fluids to keep the pee clear or pale yellow.   Only take medicine as told by your doctor.   Medicines may be prescribed that will open up the airways. This will help make breathing easier.   Bronchitis usually gets better on its own in a few days.  Recovery from some problems (symptoms) of bronchitis may be slow. You should start feeling a little better after 2 to 3 days. Coughing may last for 3 to 4 weeks. GET HELP RIGHT AWAY IF:  You or your child has a temperature by mouth above 104f, not controlled by medicine.   Chills or chest pain develops.   You or your child develops very bad shortness of breath.   There is bloody saliva mixed with mucus (sputum).   You or your child throws up (vomits) often, loses too much fluid (dehydration), feels faint, or has a very bad headache.   You or your child does not improve after 1 week of treatment.  MAKE SURE YOU:   Understand these instructions.   Will watch this condition.   Will get help right away if you or your child is not doing well or gets worse.  Document Released: 12/04/2007 Document Re-Released: 09/11/2009 [employer] Patient Information 2011 [employer]. 

## 2010-10-06 ENCOUNTER — Encounter: Payer: Self-pay | Admitting: Family Medicine

## 2010-10-06 NOTE — Assessment & Plan Note (Signed)
Patient reports 1-2 episodes of bronchitis annually, patient given Septra and Tussionex to manage symptoms and treat illness. Push clear fluids and increase rest. Follow up if symptoms do not improve

## 2010-10-06 NOTE — Assessment & Plan Note (Signed)
Well controlled today despite acute illness, avoid all products containing DM and Sudafed

## 2010-10-06 NOTE — Progress Notes (Signed)
Subjective:    Patient ID: Gloria [name], female    DOB: 1950-12-23, 60 y.o.   MRN: [id number]  HPI Patient notes a roughly 3 week history of many symptoms. She has been struggling with fatigue, malaise, myalgias, HA, anorexia, SOB, cough productive of green phlegm. SOB and nasal congestion are worsening. She is noting some chest tightness and the cough is keeping her up at night. She has taken some extra of her pain medications during the past week to help quiet her cough and manage her discomfort. Patient does not believe she has had any fevers or chills.  Past Medical History  Diagnosis Date  . Anxiety state, unspecified   . Arthropathy, unspecified, site unspecified   . Other specified personal history presenting hazards to health   . Unspecified personal history presenting hazards to health   . Depressive disorder, not elsewhere classified   . Family history of diabetes mellitus   . Family history of malignant neoplasm of gastrointestinal tract   . Headache   . Other and unspecified hyperlipidemia   . Unspecified essential hypertension   . Migraine, unspecified, without mention of intractable migraine without mention of status migrainosus   . Breast screening, unspecified   . Unspecified urinary incontinence   . Personal history of unspecified urinary disorder     Past Surgical History  Procedure Date  . Abdominal hysterectomy 2002    Family History  Problem Relation Age of Onset  . Arthritis      family history  . Colon cancer      grandparent <60  . Diabetes      1st degree relative  . Hypertension      grandparent    History   Social History  . Marital Status: Single    Spouse Name: N/A    Number of Children: 1  . Years of Education: N/A   Occupational History  . Self employed-interior design/flower arrangements    Social History Main Topics  . Smoking status: Never Smoker   . Smokeless tobacco: Never Used  . Alcohol Use: No  . Drug Use: No  . Sexually  Active: Not on file   Other Topics Concern  . Not on file   Social History Narrative  . No narrative on file    Current Outpatient Prescriptions on File Prior to Visit  Medication Sig Dispense Refill  . ALPRAZolam (XANAX) 1 MG tablet Take 1 mg by mouth 3 (three) times daily as needed.        Marland Kitchen lisinopril-hydrochlorothiazide (PRINZIDE,ZESTORETIC) 20-25 MG per tablet Take 1 tablet by mouth daily.          Allergies  Allergen Reactions  . Codeine        Review of Systems  Constitutional: Positive for activity change, appetite change and fatigue. Negative for fever and chills.  HENT: Positive for hearing loss, congestion, rhinorrhea and sinus pressure. Negative for ear pain, nosebleeds, sneezing, neck pain and ear discharge.   Eyes: Negative for discharge.  Respiratory: Positive for cough, chest tightness and shortness of breath. Negative for wheezing.   Cardiovascular: Negative for chest pain, palpitations and leg swelling.  Gastrointestinal: Negative for nausea, vomiting, abdominal pain, constipation and abdominal distention.  Genitourinary: Negative for urgency.  Musculoskeletal: Positive for myalgias. Negative for back pain.  Neurological: Negative for syncope.  Psychiatric/Behavioral: Negative for agitation. The patient is not nervous/anxious.        Objective:   Physical Exam  Constitutional: She is oriented to person, place, and  time. She appears well-developed and well-nourished. No distress.  HENT:  Head: Normocephalic and atraumatic.  Right Ear: External ear normal.  Left Ear: External ear normal.  Mouth/Throat: No oropharyngeal exudate.  Eyes: Right eye exhibits no discharge. Left eye exhibits no discharge. No scleral icterus.  Neck: Neck supple.  Cardiovascular: Normal rate, regular rhythm and normal heart sounds.   Pulmonary/Chest: Effort normal. She has no wheezes. She has rales.       Rales RLL  Abdominal: She exhibits no distension and no mass.    Musculoskeletal: She exhibits no edema.  Lymphadenopathy:    She has no cervical adenopathy.  Neurological: She is alert and oriented to person, place, and time.          Assessment & Plan:  HYPERTENSION Well controlled today despite acute illness, avoid all products containing DM and Sudafed  Acute bronchitis Patient reports 1-2 episodes of bronchitis annually, patient given Septra and Tussionex to manage symptoms and treat illness. Push clear fluids and increase rest. Follow up if symptoms do not improve

## 2010-10-10 ENCOUNTER — Other Ambulatory Visit: Payer: Self-pay | Admitting: Internal Medicine

## 2010-10-10 DIAGNOSIS — Z1231 Encounter for screening mammogram for malignant neoplasm of breast: Secondary | ICD-10-CM

## 2010-10-19 ENCOUNTER — Ambulatory Visit (HOSPITAL_COMMUNITY)
Admission: RE | Admit: 2010-10-19 | Discharge: 2010-10-19 | Disposition: A | Discharge status: Home / Self Care | Payer: Self-pay | Source: Ambulatory Visit | Attending: Internal Medicine | Admitting: Internal Medicine

## 2010-10-19 DIAGNOSIS — Z1231 Encounter for screening mammogram for malignant neoplasm of breast: Secondary | ICD-10-CM

## 2010-10-30 ENCOUNTER — Other Ambulatory Visit: Payer: Self-pay | Admitting: Internal Medicine

## 2010-11-01 ENCOUNTER — Ambulatory Visit (INDEPENDENT_AMBULATORY_CARE_PROVIDER_SITE_OTHER)
Admission: RE | Admit: 2010-11-01 | Discharge: 2010-11-01 | Disposition: A | Discharge status: Home / Self Care | Payer: Self-pay | Source: Ambulatory Visit | Attending: Endocrinology | Admitting: Endocrinology

## 2010-11-01 ENCOUNTER — Ambulatory Visit (INDEPENDENT_AMBULATORY_CARE_PROVIDER_SITE_OTHER): Payer: Self-pay | Admitting: Endocrinology

## 2010-11-01 ENCOUNTER — Encounter: Payer: Self-pay | Admitting: Internal Medicine

## 2010-11-01 ENCOUNTER — Encounter: Payer: Self-pay | Admitting: Endocrinology

## 2010-11-01 DIAGNOSIS — J45909 Unspecified asthma, uncomplicated: Secondary | ICD-10-CM

## 2010-11-01 DIAGNOSIS — M129 Arthropathy, unspecified: Secondary | ICD-10-CM

## 2010-11-01 DIAGNOSIS — M1990 Unspecified osteoarthritis, unspecified site: Secondary | ICD-10-CM

## 2010-11-01 DIAGNOSIS — J45901 Unspecified asthma with (acute) exacerbation: Secondary | ICD-10-CM | POA: Insufficient documentation

## 2010-11-01 MED ORDER — AZITHROMYCIN 500 MG PO TABS: 500.0000 mg | ORAL_TABLET | Freq: Every day | ORAL | Status: DC

## 2010-11-01 MED ORDER — METHYLPREDNISOLONE SODIUM SUCC 125 MG IJ SOLR: 125.0000 mg | Freq: Once | INTRAMUSCULAR | Status: DC

## 2010-11-01 MED ORDER — METHYLPREDNISOLONE SODIUM SUCC 125 MG IJ SOLR
125.0000 mg | Freq: Once | INTRAMUSCULAR | Status: AC
  Administered 2010-11-01: 125 mg via INTRAMUSCULAR

## 2010-11-01 MED ORDER — ALPRAZOLAM 1 MG PO TABS: 1.0000 mg | ORAL_TABLET | Freq: Three times a day (TID) | ORAL | Status: DC | PRN

## 2010-11-01 MED ORDER — HYDROCODONE-ACETAMINOPHEN 5-500 MG PO TABS: 1.0000 | ORAL_TABLET | Freq: Every day | ORAL | Status: DC | PRN

## 2010-11-01 MED ORDER — PREDNISONE 20 MG PO TABS: ORAL_TABLET | ORAL | Status: DC

## 2010-11-01 NOTE — Patient Instructions (Addendum)
i have sent prescription to your pharmacy, for steroids, and for an antibiotic. Return here tomorrow. Go to the emergency room asap, or call 911, if it gets worse. Continue your nebulizer, as needed. here is a sample of "advair-100."  take 1 puff 2x a day.  rinse mouth after using.

## 2010-11-01 NOTE — Progress Notes (Signed)
  Subjective:    Patient ID: Gloria [name], female    DOB: [birth year], 60 y.o.   MRN: [id number]  HPI Pt states a few mos of moderate wheezing in the chest, and assoc sob.  She has an intermittently prod cough. Xanax helps anxiety vicodin helps migraine and low-back pain. Past Medical History  Diagnosis Date  . Anxiety state, unspecified   . Arthropathy, unspecified, site unspecified   . Other specified personal history presenting hazards to health   . Unspecified personal history presenting hazards to health   . Depressive disorder, not elsewhere classified   . Family history of diabetes mellitus   . Family history of malignant neoplasm of gastrointestinal tract   . Headache   . Other and unspecified hyperlipidemia   . Unspecified essential hypertension   . Migraine, unspecified, without mention of intractable migraine without mention of status migrainosus   . Breast screening, unspecified   . Unspecified urinary incontinence   . Personal history of unspecified urinary disorder     Past Surgical History  Procedure Date  . Abdominal hysterectomy 2002    History   Social History  . Marital Status: Divorced    Spouse Name: N/A    Number of Children: 1  . Years of Education: N/A   Occupational History  . Self employed-interior design/flower arrangements    Social History Main Topics  . Smoking status: Never Smoker   . Smokeless tobacco: Never Used  . Alcohol Use: No  . Drug Use: No  . Sexually Active: Not on file   Other Topics Concern  . Not on file   Social History Narrative  . No narrative on file    Current Outpatient Prescriptions on File Prior to Visit  Medication Sig Dispense Refill  . albuterol (PROVENTIL) (2.5 MG/3ML) 0.083% nebulizer solution Take 3 mLs (2.5 mg total) by nebulization every 4 (four) hours as needed for wheezing.  25 vial  2  . chlorpheniramine-HYDROcodone (TUSSIONEX PENNKINETIC ER) 10-8 MG/5ML LQCR 5cc po twice daily prn cough  115 mL  0    . lisinopril-hydrochlorothiazide (PRINZIDE,ZESTORETIC) 20-25 MG per tablet TAKE ONE TABLET BY MOUTH ONCE EVERY DAY  30 tablet  5    Allergies  Allergen Reactions  . Codeine   . Penicillins     Family History  Problem Relation Age of Onset  . Arthritis      family history  . Colon cancer      grandparent <60  . Diabetes      1st degree relative  . Hypertension      grandparent    BP 118/86  Pulse 112  Temp(Src) 98.4 F (36.9 C) (Oral)  Resp 24  SpO2 97%    Review of Systems She has diaphoresis, but no fever.    Objective:   Physical Exam GENERAL: no resp distress LUNGS:  Clear to auscultation, except diffuse exp wheezes.      albuterol rx please.  Solumedrol 125 mg im please.     Assessment & Plan:  Acute bronchitis. Low-back pain, well-controlled Anxiety, well-controlled

## 2010-11-02 ENCOUNTER — Ambulatory Visit (INDEPENDENT_AMBULATORY_CARE_PROVIDER_SITE_OTHER): Payer: Self-pay | Admitting: Internal Medicine

## 2010-11-02 ENCOUNTER — Encounter: Payer: Self-pay | Admitting: Internal Medicine

## 2010-11-02 VITALS — BP 142/84 | HR 106 | Temp 97.9°F | Ht 67.0 in | Wt 212.1 lb

## 2010-11-02 DIAGNOSIS — J209 Acute bronchitis, unspecified: Secondary | ICD-10-CM

## 2010-11-02 DIAGNOSIS — R062 Wheezing: Secondary | ICD-10-CM

## 2010-11-02 DIAGNOSIS — Z0000 Encounter for general adult medical examination without abnormal findings: Secondary | ICD-10-CM

## 2010-11-02 DIAGNOSIS — I10 Essential (primary) hypertension: Secondary | ICD-10-CM

## 2010-11-02 MED ORDER — METHYLPREDNISOLONE ACETATE 80 MG/ML IJ SUSP
120.0000 mg | Freq: Once | INTRAMUSCULAR | Status: AC
  Administered 2010-11-02: 120 mg via INTRAMUSCULAR

## 2010-11-02 NOTE — Patient Instructions (Signed)
You had the steroid shot today Continue all other medications as before Please take the prednisone at 6 the first day (which was yesterday), 4 pills today, then 3 tomorrow, 2 the next day, 1 the next day , then stop You will be contacted regarding the referral for: colonoscopy (and I have made them aware of the insurance for this month)

## 2010-11-02 NOTE — Assessment & Plan Note (Signed)
Improved dramatically from yesterday after depomedrol, advair and prednisone 60 mg ;  To cont meds as prescribed, will repeat depomedrol IM today,  to f/u any worsening symptoms or concerns, very much doubt she will worsen over the wkend

## 2010-11-02 NOTE — Assessment & Plan Note (Signed)
Improved,  to f/u any worsening symptoms or concerns, Continue all other medications as before

## 2010-11-02 NOTE — Assessment & Plan Note (Signed)
Not charged today,  But pt reqeusts referral for colonscopy as she has 100% coverage until the end of the month for insurance

## 2010-11-02 NOTE — Assessment & Plan Note (Signed)
stable overall by hx and exam, most recent lab reviewed with pt, and pt to continue medical treatment as before  BP Readings from Last 3 Encounters:  11/02/10 142/84  11/01/10 118/86  10/05/10 124/86

## 2010-11-03 ENCOUNTER — Encounter: Payer: Self-pay | Admitting: Internal Medicine

## 2010-11-03 NOTE — Progress Notes (Signed)
Subjective:    Patient ID: Gloria [name], female    DOB: 03/17/1951, 60 y.o.   MRN: [id number]  HPI  Here to f/u,, one day post severe copd exac , with depomedrol IM yest and high dose prednisone as pt declines admit to hosp;  Today states remarkably improved with decreased sob, and wheezing;  Pt denies chest pain, increased sob or doe, wheezing, orthopnea, PND, increased LE swelling, palpitations, dizziness or syncope.  Pt denies new neurological symptoms such as new headache, or facial or extremity weakness or numbness   Pt denies polydipsia, polyuria.    Overall good compliance with treatment, and good medicine tolerability.   Pt denies fever, wt loss, night sweats, loss of appetite, or other constitutional symptoms except with current symptoms Past Medical History  Diagnosis Date  . Anxiety state, unspecified   . Arthropathy, unspecified, site unspecified   . Other specified personal history presenting hazards to health   . Unspecified personal history presenting hazards to health   . Depressive disorder, not elsewhere classified   . Family history of diabetes mellitus   . Family history of malignant neoplasm of gastrointestinal tract   . Headache   . Other and unspecified hyperlipidemia   . Unspecified essential hypertension   . Migraine, unspecified, without mention of intractable migraine without mention of status migrainosus   . Breast screening, unspecified   . Unspecified urinary incontinence   . Personal history of unspecified urinary disorder    Past Surgical History  Procedure Date  . Abdominal hysterectomy 2002    reports that she has never smoked. She has never used smokeless tobacco. She reports that she does not drink alcohol or use illicit drugs. family history includes Arthritis in an unspecified family member; Colon cancer in an unspecified family member; Diabetes in an unspecified family member; and Hypertension in an unspecified family member. Allergies  Allergen  Reactions  . Codeine   . Penicillins    Current Outpatient Prescriptions on File Prior to Visit  Medication Sig Dispense Refill  . albuterol (PROVENTIL) (2.5 MG/3ML) 0.083% nebulizer solution Take 3 mLs (2.5 mg total) by nebulization every 4 (four) hours as needed for wheezing.  25 vial  2  . ALPRAZolam (XANAX) 1 MG tablet Take 1 tablet (1 mg total) by mouth 3 (three) times daily as needed.  30 tablet  1  . azithromycin (ZITHROMAX) 500 MG tablet Take 1 tablet (500 mg total) by mouth daily.  6 tablet  0  . chlorpheniramine-HYDROcodone (TUSSIONEX PENNKINETIC ER) 10-8 MG/5ML LQCR 5cc po twice daily prn cough  115 mL  0  . HYDROcodone-acetaminophen (VICODIN) 5-500 MG per tablet Take 1-2 tablets by mouth daily as needed.  50 tablet  0  . lisinopril-hydrochlorothiazide (PRINZIDE,ZESTORETIC) 20-25 MG per tablet TAKE ONE TABLET BY MOUTH ONCE EVERY DAY  30 tablet  5  . predniSONE (DELTASONE) 20 MG tablet 3 tabs 2x a day  50 tablet  0   Review of Systems All otherwise neg per pt     Objective:   Physical Exam BP 142/84  Pulse 106  Temp(Src) 97.9 F (36.6 C) (Oral)  Ht 5\' 7"  (1.702 m)  Wt 212 lb 2 oz (96.219 kg)  BMI 33.22 kg/m2  SpO2 97% Physical Exam  VS noted Constitutional: Pt appears well-developed and well-nourished.  HENT: Head: Normocephalic.  Right Ear: External ear normal.  Left Ear: External ear normal.  Bilat tm's mild erythema.  Sinus nontender.  Pharynx mild erythema Eyes: Conjunctivae and EOM are  normal. Pupils are equal, round, and reactive to light.  Neck: Normal range of motion. Neck supple.  Cardiovascular: Normal rate and regular rhythm.   Pulmonary/Chest: Effort normal and breath sounds decr bilat with few wheeze bilat Abd:  Soft, NT, non-distended, + BS Neurological: Pt is alert. No cranial nerve deficit.  Skin: Skin is warm. No erythema.  Psychiatric: Pt behavior is normal. Thought content normal. 1+ nervous        Assessment & Plan:

## 2010-11-08 ENCOUNTER — Ambulatory Visit (AMBULATORY_SURGERY_CENTER): Payer: Self-pay | Admitting: *Deleted

## 2010-11-08 VITALS — Ht 67.0 in | Wt 205.6 lb

## 2010-11-08 DIAGNOSIS — Z1211 Encounter for screening for malignant neoplasm of colon: Secondary | ICD-10-CM

## 2010-11-08 MED ORDER — PEG-KCL-NACL-NASULF-NA ASC-C 100 G PO SOLR: ORAL | Status: DC

## 2010-11-14 ENCOUNTER — Telehealth: Payer: Self-pay | Admitting: *Deleted

## 2010-11-14 NOTE — Telephone Encounter (Signed)
Called to inform pt of chest xray results from 05/03 OV (normal.) Left message for pt to callback office.

## 2010-11-14 NOTE — Telephone Encounter (Signed)
Pt was previously informed that chest x ray was normal.

## 2010-11-16 NOTE — Op Note (Signed)
[facility]  Patient:    Gloria [name], Gloria [name] Visit Number: [id number] MRN: [id number]          Service Type: GYN Location: 4W [id number] [id number] Attending Physician:  Maxie Better Proc. Date: 02/25/01 Adm. Date:  02/25/2001   CC:         Sheronette A. Cherly Hensen, M.D.   Operative Report  PREOPERATIVE DIAGNOSIS:  Stress urinary incontinence.  POSTOPERATIVE DIAGNOSIS:  Stress urinary incontinence.  PROCEDURE PERFORMED:  Pubovaginal sling, flexible cystoscopy, and suprapubic tube placement in conjunction with Dr. Maxie Better, laparoscopic-assisted vaginal hysterectomy.  SURGEON:  Barron Alvine, M.D.  ANESTHESIA:  General.  INDICATIONS:  The patient is a 60 year old female.  She has had longstanding stress incontinence.  Several years back, she underwent a retropubic suspension which was felt to be a Burch procedure.  Her incontinence never resolved and she continued to have progressively worsening stress urinary leakage.  When we elevated her, she had a mild cystocele with evidence of some hypermobility.  She had obvious moderate to significant stress incontinence with a positive Gaynell Face test.  The patient had been evaluated by         Dr. Cherly Hensen.  She has had dysfunctional vaginal bleeding and was to undergo a laparoscopic-assisted vaginal hysterectomy.  Because of this, she was sent to [place] for evaluation by Dr. Cherly Hensen.  We discussed at length with the patient the opportunity to provide an additional anti-incontinence procedure concurrently with her hysterectomy.  We spent significant time discussing the pros as well as the complications and cons of such an approach.  Full informed consent was obtained.  TECHNIQUE AND FINDINGS:  The patient had already had successful induction with general anesthesia and was in the lithotomy position.  The laparoscopic hysterectomy had been completed.  At the time that we began our case, Dr. Cherly Hensen and her  assistant were doing some additional evaluation laparoscopically to be sure that the bleeding was under control and that there were no other abnormalities.  The vaginal cuff had recently been closed and no significant bleeding had occurred.  The patient was stable and at that point, estimated blood loss was 200-300 cc.  A Foley catheter was replaced into the bladder.  A weighted vaginal speculum was used.  The anterior vaginal mucosa was infiltrated.  An incision was made from mid urethra back towards the bladder neck area.  This was not extended all the way to the vaginal cuff because we did not feel that a formal anterior repair was required.  The vaginal planes were easily established between the bladder and the vagina.  Establishment of the planes up in the retropubic space were a different matter all together.  Because of her previous Burch, this was extremely difficult.  These tissues were very thickened and difficult to penetrate.  We used a combination of blunt as well as sharp dissection technique.  Clamps were utilized right underneath the pubic symphysis with attempts to break through the retropubic fascia.  Again, this was extremely difficult to do, but eventually, we were able to get fingers behind the pubic symphysis on both sides.  There were quite a few adhesions to this area.  We carefully inspected the lateral aspect of the bladder neck and there was one area where the mucosa was pushing out of it.  The bladder was not entered, but that area of tissue seemed a bit thin during this aforementioned dissection.  For that reason, several buttressing sutures of 2-0 Vicryl were used in this  area.  We then turned our attention to the suprapubic area.  Utilizing one of the trocar sites suprapubically, we extended that incision right over the pubic symphysis, and this was carried down to the level of the anterior fascia. Again, we found marked scarring noted.  The sling itself  was made with a piece of cadaveric fascia lata measuring approximately 2.5 cm by approximately 10-12 cm.  This was anchored at both ends with #1 nylon suture.  With a direct digital control, a finger was placed behind the pubic symphysis on the right vaginal incision, and a clamp was placed behind the pubic symphysis.  Again, the scar tissue was really significant.  It was somewhat difficult to advance the clamp up through the fascia.  Once we got behind the pubic symphysis, we could feel our finger and we brought the clamp out the right vaginal incision.  The nylon suture was grabbed and the same thing was done on the other side.  In this manner, the sling was positioned right at the mid urethra under no tension.  It was pexed in an open position.  Cystoscopy was then performed.  We could not see any evidence of suture or bladder injury.  Blue dye could be seen from both ureteral orifices and a suprapubic tube was placed under direct visual guidance.  We then trimmed a very small amount of anterior vaginal mucosa and closed the vaginal incision with a running 2-0 Vicryl suture.  Vaginal packing was then utilized.  The suprapubic wou8nd was copiously irrigated with antibiotic solution, and then closed with interrupted Vicryl as well as some skin clips.  The suprapubic tube was left to gravity drainage, and a Foley was also placed to really make certain that the bladder remained empty and under no tension.  The patient appeared to tolerate the procedure well.  Overall blood loss was approximately 500 cc.  She was brought to the recovery room in stable condition. Attending Physician:  Maxie Better DD:  02/25/01 TD:  02/25/01 Job: [id number]

## 2010-11-16 NOTE — H&P (Signed)
[facility]  Patient:    Gloria [name], Gloria [name] Visit Number: [id number] MRN: [id number]          Service Type: Attending:  Sheronette A. Cherly Hensen, M.D. Dictated by:   Sheria Lang. Cherly Hensen, M.D. Adm. Date:  02/25/01                           History and Physical  CHIEF COMPLAINT:  Heavy menses, menstrual migraine and loss of urine with cough and sneeze.  HISTORY OF PRESENT ILLNESS:  This is a 60 year old gravida 1, para 1, married black female, last menstrual period of February 03, 2001, who is now being admitted for a laparoscopic-assisted vaginal hysterectomy with bilateral salpingo-oophorectomy and a pubovaginal sling secondary to symptomatic uterine fibroids and urinary incontinence.  The patient was first referred to me by Dr. Merlene Laughter. Renae Gloss in May of 2002 to evaluate heavy menses, hot flashes and headaches.  The patient had had over the past six to eight months, cycles that were quite heavy with large clots and she would flow for four to four and a half days with the increased bleeding.  Her cycles were every 28 days.  She would change 14 overnight pads in a 2-day period and the patient noted the onset of right-sided headache about 1 week pre and 1 week post her menses. She had had a full evaluation at the headache center and treated with different medications without relief of her symptoms with respect to the headaches.  The patient also complained of decreased energy level as well as some hot flashes every day.  Patient had also awakened in the night with the headache.  She has also had pain with intercourse and noted occasional intermenstrual bleeding.  Ultrasound on December 17, 2000 revealed a fibroid uterus measuring 7.9 x 4.6 x 6.4 cm.  Both ovaries were seen and normal. There was a hypoechoic 0.61-cm mass in the endometrium.  The endometrium thickness was 0.9 at the time.  Her largest fibroid was 1.79 cm and most of the fibroids seen were along the  submucosal area of the uterus.  The patient underwent an endometrial biopsy on January 07, 2001 that shows secretory phase with extensive stromal and glandular breakdown.  The patient has been using Provera to try to decrease her bleeding.  TSH was normal on Nov 25, 2000.  Her CBC on January 07, 2001 showed a hemoglobin of 11.0; hematocrit was 35.  The patient also has been complaining of leakage of urine with cough or sneeze. She has no urgency, no dysuria.  She had had a bladder suspension surgery done by Dr. Lindaann Slough about eight years ago but about a year later had the recurrence of her symptoms and has continued to have loss of urine since that time.  The patient has had a tubal ligation.  The patient has used Mepergan for her headache.  The patient now presents for definitive management of her above problem.  ALLERGIES:  COMPAZINE and CODEINE.  PAST MEDICAL HISTORY:  Medical history is of ______ , migraines, chronic hypertension x 4 to 5 years, hypertriglyceridemia.  PAST SURGICAL HISTORY:  Tubal ligation, bladder suspension about eight years ago.  OBSTETRICAL HISTORY:  Vaginal delivery x 1.  MEDICATIONS:  Mepergan Forte and Xanax for headache, Inderal LA.  FAMILY HISTORY:  Hypertension -- sister and mother; diabetes -- grandmother; no ovarian, colon or breast cancer.  SOCIAL HISTORY:  Married.  Nonsmoker.  An 100 year old child.  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  REVIEW OF SYSTEMS:  Patient wears reading glasses.  She has dizzy spells during her migraines.  She occasionally has problems with sinuses and noticed that she has kind of an increased shortness of breath with "lack of exercise." She was treated in May 2002 for Candida vaginitis.  PHYSICAL EXAMINATION:  GENERAL:  A well-developed, well-nourished black female in no acute distress.  VITAL SIGNS:  Blood pressure 148/88.  Weight of 182 pounds.  SKIN:  No lesions.  HEENT:   Anicteric sclerae.  Pink conjunctivae.  Oropharynx negative.  HEART:  Regular rate and rhythm without murmurs.  LUNGS:  Clear to auscultation.  BREASTS:  Soft, nontender.  No palpable mass.  ABDOMEN:  Soft, nontender, except in the suprapubic area.  A transverse scar is noted.   She is nondistended.  PELVIC:  Her vulva showed no lesions.  Vagina had no discharge.  Her cervix was parous, closed.  Uterus was retroverted, irregular, about 8 to 10 weeks size.  There is some suprapubic tenderness.   Adnexa were nontender.  No palpable mass.  LABORATORY DATA:  Urine culture was negative on February 16, 2001.  Chest x-ray was negative on February 18, 2001 and mammogram was negative, 2001.  Labs in the past showed negative MRI of the brain in November 1998 and negative MR angiography of the intracranial circulation also in November 1998 as part of her workup for her right-sided headache.  Patients triglyceride was 383 on February 19, 1999, her total cholesterol was 155 and her HDL was 36.  IMPRESSION: 1. Menorrhagia secondary to uterine fibroids. 2. Menstrual migraines. 3. Stress urinary incontinence. 4. Chronic hypertension.  PLAN:  LAVH/BSO with a pubovaginal sling by Dr. Barron Alvine, admission at [facility] for the above procedure.  Antibiotic prophylaxis, antiembolic stockings, analgesics p.r.n.  Risks of the procedure including, but not limited to, inability to complete the procedure vaginally; infection; bleeding which may require blood transfusion; risks of blood transfusion including acute reaction, hepatitis transmission 1 out of 3000, HIV transmission 1 out of 100,000; internal scar tissue formation; menopausal symptoms, as the patient will become surgically menopausal due to the procedure, and these symptoms include decreased libido, vaginal dryness, hot flashes; injury to surrounding organ structures such as the bladder, bowel or ureter; and the risks of the bladder  suspension surgery were discussed by  Dr. Isabel Caprice with the patient; please see his notes.  The patient has a living will; her husband is the executor.  Hormone replacement therapy was discussed and the patient has agreed to have estrogen postoperatively.  Criteria for discharge was discussed and postop care discussed.  All questions answered. Dictated by:   Sheria Lang. Cherly Hensen, M.D. Attending:  Sheronette A. Cherly Hensen, M.D. DD:  02/25/01 TD:  02/25/01 Job: [id number]

## 2010-11-16 NOTE — Op Note (Signed)
NAME:  Gloria [name], Gloria [name]                       ACCOUNT NO.:  [id number]   MEDICAL RECORD NO.:  [id number]                   PATIENT TYPE:  AMB   LOCATION:  DSC                                  FACILITY:  [facility]   PHYSICIAN:  Sherri Rad, M.D.               DATE OF BIRTH:  [birth year]   DATE OF PROCEDURE:  03/16/2002  DATE OF DISCHARGE:                                 OPERATIVE REPORT   PREOPERATIVE DIAGNOSES:  1. Right calcaneal exostosis.  2. Right fibular spur.  3. 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  4. Subluxing peroneal tendons.   PROCEDURES:  1. Excisional biopsy, exostosis, right calcaneus.  2. Excision, fibular spurs.  3. Tenosynovectomy and repair, peroneus brevis tendon.  4. Repair, subluxing peroneal tendons.   ANESTHESIA:  General endotracheal tube.   SURGEON:  Sherri Rad, M.D.   ASSISTANT:  Shawna Orleans, P.A.   ESTIMATED BLOOD LOSS:  Minimal.   TOURNIQUET TIME:  About 1 hour 15 minutes.   COMPLICATIONS:  None.   DISPOSITION:  Stable to PAR.   INDICATIONS:  This is a 60 year old female who has had longstanding  posterolateral right ankle pain to the point where it is interfering with  her life.  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  She was consented for the above procedure.  All  risks, which include infection, nerve or vessel injury, possible malignancy,  persistent pain, worsening pain, stiffness, were all explained, and  questions were encouraged and answered.   DESCRIPTION OF PROCEDURE:  The patient was brought to the operating room and  placed initially in the supine position after adequate general endotracheal  tube anesthesia was administered as well as Ancef 1 g IV piggyback.  The  right lower extremity was then prepped and  draped in a sterile manner over a  proximally-placed thigh tourniquet.  After the patient was placed in the  left lateral decubitus position with the right side, operative side, up, and  an axillary roll placed, all bony prominences were well-padded.  The  procedure commenced with a curvilinear incision on the posterior lateral  aspect of the ankle.  Dissection was carried down through skin.  Sural  nerves and soft tissues posteriorly were protected.  The peroneal  retinaculum was then opened approximately 3-4 mm just posterior to the  lateral malleolus.  The peroneal tendons were identified.  There was a tear  within the peroneus brevis tendon more distally within the wound.  On the  posterior aspect of the lateral malleolus, however, there was a large amount  of osteophytes posteriorly as well as off the inferior aspect of the lateral  malleolus that was debrided with a rongeur as well as  well as with a curved  quarter-inch osteotome.  We then encountered the large pedunculated  exostosis off the lateral wall of the calcaneus.  This was then removed with  a curved quarter-inch osteotome, where it was flush to the lateral wall of  the calcaneus.  CT scan images were used to confirm preoperatively where  exactly the lesion was for adequate excision.  This was sent to pathology as  well as the local synovitis that was debrided from around the peroneal  tendons.  A peroneus brevis tear was repaired with a 3-0 nylon suture in a  cannulated manner.  We then repaired the subluxing peroneal tendon with 0  Ethibond suture once a bed of cancellous bone was made on the posterior  lateral corner of the lateral malleolus.  This was repaired in a vest-over-  pants manner with the 0 Ethibond suture.  The reinforcing retinaculum was  closed with 0 Ethibond suture after the area was copiously irrigated with  normal saline.  Once the wound was copiously irrigated with normal saline,  the subcu was closed  with 3-0 Vicryl, skin was closed with 4-0 nylon.  Sterile dressing was applied.  Prior to closure the tourniquet was deflated  and hemostasis was obtained.  A modified Jones dressing was applied with the  ankle in neutral dorsiflexion.  The patient was stable to the PAR.                                                Sherri Rad, M.D.    PAB/MEDQ  D:  03/16/2002  T:  03/17/2002  Job:  [id number]

## 2010-11-16 NOTE — Discharge Summary (Signed)
Gloria [name], Gloria [name]                  ACCOUNT NO.:  [id number]   MEDICAL RECORD NO.:  [id number]          PATIENT TYPE:  INP   LOCATION:  5511                         FACILITY:  [facility]   PHYSICIAN:  Isidor Holts, M.D.  DATE OF BIRTH:  Jun 13, 1951   DATE OF ADMISSION:  03/12/2005  DATE OF DISCHARGE:  03/15/2005                                 DISCHARGE SUMMARY   DISCHARGE DIAGNOSES:  1.  Infective exacerbation of bronchial asthma.  2.  Migraine headaches.  3.  Hypertension.   DISCHARGE MEDICATIONS:  1.  Avelox 400 mg p.o. daily for three days only; from March 16, 2005.  2.  Prednisone 40 mg p.o. daily for three days, then 30 mg p.o. daily for      three days, then 20 mg p.o. daily for three days, then 10 mg p.o. daily      for three days, then 5 mg p.o. daily for two days, then stop.  3.  Diovan/HCT (80/12.5) one p.o. daily.  4.  Meperidine/promethazine (50/25) one pill p.o. p.r.n. q.6h. for migraine      headaches, a total of 12 pills have been dispensed.  5.  Albuterol MDI two puffs p.r.n. q.6h.  6.  Robitussin cough syrup 10 mL p.r.n. t.i.d. (over the counter).   PROCEDURE:  Chest x-ray dated March 12, 2005. This showed mild  atelectasis or infiltrates at the bases; otherwise no acute findings.   CONSULTATIONS:  None.   ADMISSION HISTORY:  In brief, this is a 60 year old female, with a known  history of bronchial asthma on home nebulizers. Also hypertension, anxiety,  and migraine headaches, who was referred to [facility] for direct  admission, per PMD, Dr. Andi Devon, for increased shortness of breath  and wheezing.   CLINICAL COURSE:   PROBLEM #1:  Acute exacerbation of bronchial asthma. The patient is a known  asthmatic. She presents with increased shortness of breath and wheeze.  Physical examination revealed bilateral polyphonic expiratory wheezes. She  was managed with a combination of corticosteroids, bronchodilator  nebulizers, and oxygen  supplementation. Chest x-ray dated March 12, 2005, showed no acute findings. However, the patient had a cough productive  of yellowish phlegm. It was felt that this might be consistent with  infective exacerbation. She was therefore treated with Avelox in addition.  Clinical response was steady, albeit somewhat slow. However, as of March 14, 2005, we were able to switch the patient to oral prednisone taper due to  clinical improvement.   PROBLEM #2:  Migraine headaches. During the course of the patient's hospital  stay, she complained of episodes of right-sided hemicranial headaches which  she has had practically all her life and which she states is cyclic. She was  managed with analgesics with good clinical effect.   PROBLEM #3:  Hypertension. This was controlled with patient's pre-admission  dosage of Diovan /hydrochlorothiazide and did not prove problematic  throughout the course of the patient's hospital stay.   DISPOSITION:  The patient was discharged in satisfactory condition on  March 16, 2005, on a tapering course of  steroids.   ACTIVITY:  As tolerated.   WOUND CARE:  Not applicable.   FOLLOWUP INSTRUCTIONS:  The patient is recommended to follow up with PMD,  Dr. Andi Devon, within one week. She has been instructed to call for  an appointment, and has verbalized understanding.      Isidor Holts, M.D.  Electronically Signed     CO/MEDQ  D:  03/16/2005  T:  03/17/2005  Job:  [id number]   cc:   Merlene Laughter. Renae Gloss, M.D.  Fax: [phone]

## 2010-11-16 NOTE — Discharge Summary (Signed)
   NAME:  Gloria [name], Gloria [name]                       ACCOUNT NO.:  [id number]   MEDICAL RECORD NO.:  [id number]                   PATIENT TYPE:  INP   LOCATION:  5533                                 FACILITY:  [facility]   PHYSICIAN:  Merlene Laughter. Renae Gloss, M.D.           DATE OF BIRTH:  12/07/1950   DATE OF ADMISSION:  08/26/2002  DATE OF DISCHARGE:  08/28/2002                                 DISCHARGE SUMMARY   DISCHARGE DIAGNOSES:  1. Atypical chest pain.  2. Hypertension.  3. Anxiety/depression.  4. Migraine headaches.   HOSPITAL COURSE:  The patient was admitted for further evaluation and  treatment for aggressively worsening respiratory distress/shortness of  breath with chest pain.  Her initial O2 saturations on room air were 89%.  Her cardiac work-up during this admission was unremarkable.  Her chest x-ray  was also unremarkable.  The most probable cause of her shortness of breath  and chest pain was most likely secondary to bronchitis.  Her symptoms did  resolve with nebulizer treatments and IV antibiotics and steroids.  At the  time of discharge, she was breathing much more comfortably, tolerating p.o.  and ambulating without complications.   DISCHARGE MEDICATIONS:  1. _________ 160/25 one p.o. daily.  2. Ambien 2 mg p.o. q.h.s.  3. Xanax 1 mg p.o. b.i.d. p.r.n.  4. Paxil XR 12.5 mg one p.o. daily.  5. Mepergan one p.o. q.6h. p.r.n.  6. Xopenex nebulizers q.i.d.   FOLLOW UP:  The patient will be seen by Dr. Renae Gloss within two weeks  following discharge.                                               Merlene Laughter. Renae Gloss, M.D.    KRS/MEDQ  D:  10/21/2002  T:  10/22/2002  Job:  [id number]

## 2010-11-16 NOTE — Discharge Summary (Signed)
[facility]  Patient:    Gloria [name], Gloria [name] Visit Number: [id number] MRN: [id number]          Service Type: GYN Location: 4W [id number] 01 Attending Physician:  Maxie Better Dictated by:   Sheria Lang. Cherly Hensen, M.D. Admit Date:  02/25/2001 Discharge Date: 03/03/2001                             Discharge Summary  ADMITTING DIAGNOSES: 1. Menorrhagia. 2. Uterine fibroids. 3. Stress urinary incontinence.  DISCHARGE DIAGNOSES: 1. Stress urinary incontinence. 2. Menorrhagia. 3. Iron deficiency anemia. 4. Postoperative fever, resolved. 5. Paralytic ileus, resolved. 6. Intra-abdominal fluid collection.  PROCEDURE:  Examination on anesthesia, laparoscopic-assisted vaginal hysterectomy, bilateral salpingo-oophorectomy, lysis of adhesions, pubovaginal sling, flexible cystoscopy, and suprapubic tube placement by Dr. Barron Alvine.  HISTORY OF PRESENT ILLNESS:  This is a 60 year old, gravida 1, para 1, married black female admitted to [facility] for laparoscopic-assisted vaginal hysterectomy with bilateral salpingo-oophorectomy and a pubovaginal sling all secondary to symptomatic uterine fibroids and stress urinary incontinence.  Please see the dictated admission history and physical for all specific details.  HOSPITAL COURSE:  The patient was admitted to [facility] on February 25, 2001.  She was taken to the operating room where she underwent general anesthesia.  The patient underwent a laparoscopic-assisted vaginal hysterectomy, bilateral salpingo-oophorectomy, lysis of adhesions, pubovaginal sling, flexible cystoscopy, and suprapubic tube placement by Dr. Isabel Caprice.  The gynecological surgeries were performed by Dr. Cherly Hensen.  The findings at the time of the surgery were omental adhesions to the left anterior abdominal wall, bowel adhesions to the right pelvic sidewalls, and adhesion to the left adnexa.  The uterus was about 10 weeks  size.  Normal tubes and ovaries were noted.  The ureters were seen bilaterally.  There was a normal liver edge and the estimated blood loss from the gynecological surgery was 200 cc.  Her CBC preoperatively showed a hematocrit of 33.3, hemoglobin 11.3, white count 9.5, platelet count 393,000.  The patients postoperative course was notable for issues with pain management.  A CBC on postoperative day #1 showed a white count of 17.7.  Her hematocrit was 25.7, hemoglobin was 8.6.  This was repeated on February 27, 2001, and showed a hemoglobin of 8, hematocrit of 23.5. White count was 13.7.  At that time, the patient was afebrile.  The vaginal packing that had been placed was subsequently removed on postoperative day #1. The patient was complaining of right-sided abdominal pain on postoperative day #1.  She was asked to ambulate.  She had no nausea or vomiting.  She was tolerating a small amount of regular food.  Her abdomen was soft, distended. There were bowel sounds.  There was no rebound tenderness.  On postoperative day #3, the patient was passing flatus.  She was still noticing right-sided pain.  She denied any shoulder pain, nausea, or vomiting.  She admits to decreased appetite and complained of one episode of hot flashes.  Her temperature maximum at that point was 100.1 and her clinical examination had been not markedly changed.  The patient is noted to have chronic hypertension and her antihypertensive medicines were restarted.  Her PCA was discontinued. Voiding trials were planned as per Dr. Isabel Caprice.  She was started on iron supplementation.  On postoperative day #3, the patient had one episode of vomiting.  She was still having some right-sided pain, was passing flatus, no bowel movement, and  her temperature max was 101.5.  She received Tylenol. Chest x-ray was obtained.  Urinalysis showed moderate blood, no white cells. Nitrites negative.  Repeat CBC showed a white count increased  again to 16.8, with 84 polys, 10 lymphs.  Her hematocrit was 24.9.  Her exam did not show any specific findings in correlation with the temperature.  The patient had received prophylactic antibiotics.  The patient continued to have intermittent temperature spikes.  She was placed on antibiotics.  Gentamicin, clindamycin and, subsequently, ampicillin was added.  Urine culture was sent.  The pathology from the surgery revealed benign ovarian tissues, acute and chronic salpingitis, myometrium with adenomyosis and leiomyomata.  The endometrium was benign.  The cervix was chronic cervicitis.  Because of her continued postoperative fever, abdominopelvic CT scan was obtained which showed a 10 x 7 cm fluid collection in the pelvis, bibasilar atelectasis, increased lymph nodes possibly secondary to the fluid collection but needed to be followed up. The patient subsequently defervesced.  She had a bowel movement, noted some lower abdominal discomfort but remarkably improved since her postoperative day #1 and, by postoperative day #6, the patient was ready, dressed to go home. Her abdomen was soft.  She had good bowel sounds.  She had minimal tenderness i the lower area of her abdomen.  Her low transverse incision has some staples that will be removed by Dr. Noralee Chars office.  The incision had no erythema, induration, or exudate.  Her pad had scant drainage.  She had been started on Climara 0.1 mg patch on a weekly basis.  Urine culture was subsequently no growth.  The patient was thought to be deemed well to be discharged home and, on postoperative day #6, she was given the following instructions:  Call for temperature of greater than or equal to 100.4, monitor temperature twice a day, and to check temperature if shaking chills.  Nothing per vagina for four to six weeks.  No heavy lifting or driving for two to probably four weeks more in conjunction with Dr. Noralee Chars instructions.  Follow up with Dr.  Noralee Chars office.  Call if severe abdominal pain, nausea, vomiting, vaginal bleeding.  DISCHARGE MEDICATIONS: 1. Augmentin 875 mg p.o. b.i.d. for 10 days.  2. Climara 0.1 mg patch per week. 3. Mepergan Forte one p.o. q.3-4h. p.r.n. pain. 4. Iron supplementation over-the-counter, one tablet twice a day.  FOLLOW-UP:  Follow-up appointment with Dr. Isabel Caprice on March 03, 2001, and Dr. Cherly Hensen in two weeks at [facility].  DISPOSITION:  Home.  CONDITION:  Stable. Dictated by:   Sheria Lang. Cherly Hensen, M.D. Attending Physician:  Maxie Better DD:  03/26/01 TD:  03/26/01 Job: [id number]

## 2010-11-16 NOTE — H&P (Signed)
NAME:  Gloria [name], Gloria [name]                       ACCOUNT NO.:  [id number]   MEDICAL RECORD NO.:  [id number]                   PATIENT TYPE:  INP   LOCATION:  5530                                 FACILITY:  [facility]   PHYSICIAN:  Merlene Laughter. Renae Gloss, M.D.           DATE OF BIRTH:  24-Sep-1950   DATE OF ADMISSION:  08/26/2002  DATE OF DISCHARGE:                                HISTORY & PHYSICAL   CHIEF COMPLAINT:  Shortness of breath and chest pain.   HISTORY OF PRESENT ILLNESS:  The patient is a 60 year old lady who presents  with a several day history of progressively worsening  paroxysmal,  productive cough with shortness of breath and difficulty breathing.  She has  been on Tussionex and Zithromax for the last three days as an outpatient and  states her symptoms have worsened.  She also reports fever and chills but  has no nausea or vomiting.  She states her chest pain is more pronounced  with coughing.  She has no other acute constitutional symptoms or complaints  at this time.   ALLERGIES:  No known drug allergies.   MEDICATIONS:  1. Diovan 160/25 one p.o. daily.  2. Ambien 10 mg p.o. q.h.s.  3. Xanax 1 mg p.o. b.i.d. p.r.n.  4. Mepergan one p.o. q.6h. p.r.n.   PAST MEDICAL HISTORY:  Hypertension.  Migraine headaches.  Generalized  anxiety.  Status post TAH.   SOCIAL HISTORY:  The patient is married.  She denies tobacco, alcohol or  drugs of abuse.   FAMILY HISTORY:  This is significant for hypertension and diabetes.   REVIEW OF SYSTEMS:  As per HPI, patient's history assessment.  Otherwise,  negative greater than 10 systems of review.   PHYSICAL EXAMINATION:  GENERAL APPEARANCE:  Well-developed, well-nourished  black female in no acute distress.  VITAL SIGNS:  Blood pressure 146/90.  Temperature 97.6, O2 saturation on  room air 89%.  HEENT:  TMs within normal limits bilaterally.  No oropharyngeal lesions.  NECK:  Supple.  No masses, 2+ carotids, no bruits.  LUNGS:   Decreased breath sounds with expiratory wheezes bilaterally and  prolonged expiration.  CARDIOVASCULAR:  S1 and S2 regular rate and rhythm, no murmurs, rubs, or  gallops.  ABDOMEN:  Soft, nontender, nondistended with positive bowel sounds.  EXTREMITIES:  No clubbing, cyanosis, or edema.  NEUROLOGIC:  Alert and oriented x3.  Cranial nerves intact.   ASSESSMENT/PLAN:  1. Shortness of breath with atypical chest pain.  I suspect her symptoms are     most likely secondary to severe asthmatic bronchitis versus pneumonia.     She has failed outpatient treatment, so she will be admitted to the     hospital for further treatment including IV antibiotics, steroids,     aspirin, and albuterol nebulizers.  Chest x-ray will be obtained.  Since     the patient has a history of hypertension and several other cardiac risk  factors, a cardiac panel will be obtained to rule out any ischemic     factors that may be contributing to her chest pain; however, I think the     probability of a cardiac disease with this presentation is unlikely.  I     suspect her chest pain is more pleuritic in nature and associated with     her respiratory symptoms.  2. Migraine headaches.  The patient has a history of migraine headaches     usually exacerbated by stress or illness.  The usual headache syndrome is     about once per month.  Demerol and Mepergan is has usually been effective     for relief of her headaches.                                               Merlene Laughter. Renae Gloss, M.D.    KRS/MEDQ  D:  08/27/2002  T:  08/27/2002  Job:  [id number]

## 2010-11-16 NOTE — Op Note (Signed)
[facility]  Patient:    Gloria [name], Gloria [name] Visit Number: [id number] MRN: [id number]          Service Type: GYN Location: 4W [id number] 01 Attending Physician:  Maxie Better Proc. Date: 02/25/01 Adm. Date:  02/25/2001                             Operative Report  PREOPERATIVE DIAGNOSES:  Menorrhagia, uterine fibroids, stress urinary incontinence.  PROCEDURES:  Examination under anesthesia, laparoscopically-assisted vaginal hysterectomy, bilateral salpingo-oophorectomy, lysis of adhesions.  POSTOPERATIVE DIAGNOSES:  Menorrhagia, uterine fibroids, abdominopelvic adhesions, stress urinary incontinence.  ANESTHESIA:  General.  SURGEON:  Sheronette A. Cherly Hensen, M.D.  ASSISTANT:  Cordelia Pen A. Rosalio Macadamia, M.D.  INDICATIONS:  A 60 year old, gravida 1, para 1 female with symptomatic uterine fibroid and stress urinary incontinence, who is now being admitted for definitive management of her problems.  She will undergo a pubovaginal sling and cystoscopy by Dr. Isabel Caprice, and the plan from a gynecological standpoint is to do a laparoscopic-assisted vaginal hysterectomy with a bilateral salpingo-oophorectomy.  The patients history is notable for a tubal ligation in the past as well as a bladder suspension transabdominally by Dr. Brunilda Payor about eight years previously.  Risks and benefits of the procedure have been explained to the patient and her husband.  Consent was signed.  The patient was transferred to the operating room.  DESCRIPTION OF PROCEDURE:  Under adequate general anesthesia, the patient was placed in the dorsal lithotomy position.  Examination under anesthesia was notable for a 10 week size retroverted uterus with no adnexal masses that could be appreciated.  The patient was sterilely prepped and draped in the usual fashion.  Antibiotic prophylaxis had been given.  Anti-embolic stockings were in place.  An indwelling Foley catheter was placed sterilely.   The vagina, which had been prepped with Betadine solution, had a bivalve speculum placed, single-tooth tenaculum placed on the anterior lip of the cervix, and the bivalve speculum was removed.  Attention was then turned to the abdomen where an infraumbilical incision was then made through the previous scar. Veress needle was introduced.  Normal saline was used to test its placement with good placement.  Opening pressure of 3 was noted.  Three liters of CO2 was insufflated; the Veress needle was then removed, and a 10 mm disposable trocar was previously introduced into the abdominal cavity.  The trocar was removed, and a lighted video laparoscope was introduced, at which time it was noted that there was evidence of omental adhesions in this area to the anterior abdominal wall.  The port was able to subsequently be displaced medially with resultant clear visualization of the pelvic content.  There was evidence of bowel adhesions to the right anterior abdominal wall.  The liver appeared normal.  There was also some adhesions of the bowel to the left lateral sidewall as well as adhesion to the left adnexa in a fibrous, thickened manner, not containing bowel but attached to the bowel proximally. The posterior cul-de-sac was without any evidence of endometriosis.  The anterior cul-de-sac also was without any evidence of endometriosis and did not appear to have any scarring from the previous surgery.  Marcaine 0.25% was injected on the skin for the sites for the lower port, and two small incisions were made near this mid axillary line inferiorly and bilaterally with resultant 5 mm ports being placed under direct visualization.  Using these ports and the reticulator, the right anterior  abdominal wall adhesions were lysed completely.  The adhesion that was on the bowel to the left adnexa was also lysed as were the adhesions from the left pelvic sidewall.  With manipulation of the uterus inferiorly,  the procedure was started with the left round ligament being grasped with a tripolar, cauterized, and subsequently cut.  The left infundibulopelvic ligament was cauterized for approximately 1.5 cm and then cut.  This was done after the ureter was clearly identified and was distal from the site of operation.  The tripolar was then continued to be used in that area until the level of the round ligament was reached.  The same procedure was performed on the contralateral side.  The vesicouterine fold was then grasped.  A small cut was then made and a hydrodissection performed.  The vesicouterine peritoneum was then opened transversely and the bladder partially displaced inferiorly.  This was then felt to be adequate for the start of the vaginal portion of the procedure.  The abdomen was deflated. The instruments that were in these ports were removed, but the ports remained, and attention was then turned to the vagina.  A weighted speculum was placed in the vagina.  The single-tooth tenaculum was removed.  Jacobson clamps were placed on the anterior and posterior lip of the cervix and the cervix placed in traction.  Lidocaine 1% with 1:200,000 epinephrine was injected circumferentially at the junction of the cervix and the vagina.  A circumferential incision was then made and further undermined using Mayo scissors.  In that process, the posterior cul-de-sac was entered and extended transversely.  The uterosacral ligaments were clamped with Heaney clamps and suture ligated with 0 Vicryl sutures bilaterally.  The proximal pedicle was then clamped bilaterally, cut, and suture ligated with 0 Vicryl suture.  There was bleeding along the posterior vaginal cuff and near the left uterosacral ligament/pedicle.  The 0 Vicryl suture was then used to do a figure-of-eight which was then carried as a running lock stitch along the posterior edge of the vaginal cuff posteriorly to the contralateral side with  additional 0 Vicryl sutures also being made along the posterior cuff for hemostasis.  The anterior cul-de-sac was then subsequently opened, and the uterine vessels were  then bilaterally clamped, cut, and suture ligated using 0 Vicryl suture. Additional attachments of the uterus to the pelvic sidewall were then clamped, cut, and suture ligated with 0 Vicryl suture, at which time the uterus was then freed from its attachments and subsequently removed.  The cuff was then inspected circumferentially for evidence of bleeding.  Then 0 Vicryl suture was placed posteriorly for culdoplasty.  This started from the left uterosacral to the right uterosacral and tied in the midline.  With good hemostasis noted, the decision was then made to close the vaginal cuff using interrupted 0 Vicryl suture in a vertical fashion.  With the vaginal cuff closed, attention was then turned to the abdomen again.  Dr. Isabel Caprice was now present in order to perform his portion of the surgery.  With respect to the abdomen, the abdomen was reinsufflated.  The lighted video laparoscope was placed and the pelvis inspected, irrigated.  Small bleeding along the posterior aspect of the cuff was cauterized.  The areas that were irrigated and again suctioned, good hemostasis noted.  The 10 mm scope was removed, and a 5 mm scope was placed in the right lower quadrant port in order to further assess the extent of the omental adhesion to the anterior abdominal  wall and to confirm that there was no injury to the underlying structure where the infraumbilical port was placed.  The left lower quadrant port was inspected, and its site had no involvement with the omental adhesion.  It was difficult to visualize clearly the infraumbilical port due to the type of bed that was in place.  She had been placed in a urology bed and therefore was not able to be placed in deep Trendelenburg without the mattress and the patient moving proximally.   Therefore, the decision was made to make a fourth site.  One was made suprapubically with the scalpel and under direct visualization, a 5 mm port was placed and through that port, the 5 mm scope was placed, allowing for inspection of the infraumbilical port site.  There was no evidence of bowel involvement, and the omental adhesions were just lateral to where this port had entered.  At that point, reinspection of the pelvis showed good hemostasis through the midline suprapubic port.  Intergel/liquid Intercede was placed to reduce risk of additional adhesions.  The lower ports were then removed under direct visualization with the 10 mm scope being utilized to view the lower ports.  The infraumbilical port was removed, and the 5 mm scope was then used in the right lower quadrant to visualize the site from which this came, and good hemostasis noted and no evidence of surrounding organ damage was noted.  The abdomen was deflated.  The lower portion was approximated using Dermabond.  The infraumbilical port site had a 0 Vicryl figure-of-eight suture placed in the deep area, and a 4-0 Monocryl was used to approximate the skin, and Dermabond was placed over that.  The patient remained in surgery for the planned procedure by Dr. Isabel Caprice.  Please see his dictated note for the specific details.  Specimens from the gynecological portion of this surgery were uterus and cervix with tubes and ovaries bilaterally.  The estimated blood loss was 200 cc.  Intraoperative fluid was 4500 cc Crystalloid.  Urine output was 150 cc clear-yellow urine.  Cystoscopy at the end of the stress incontinent surgery revealed both ureters expressing dye.  Sponge and instrument counts x 2 were correct.  Complications were none.  The patient tolerated the procedure well and was transferred to the recovery room in stable condition. Attending Physician:  Maxie Better DD:  02/25/01 TD:  02/26/01 Job: [id number]

## 2010-11-21 ENCOUNTER — Ambulatory Visit (AMBULATORY_SURGERY_CENTER): Payer: Self-pay | Admitting: Internal Medicine

## 2010-11-21 ENCOUNTER — Encounter: Payer: Self-pay | Admitting: Internal Medicine

## 2010-11-21 VITALS — BP 130/77 | HR 94 | Temp 99.2°F | Resp 18 | Ht 67.0 in | Wt 200.0 lb

## 2010-11-21 DIAGNOSIS — Z1211 Encounter for screening for malignant neoplasm of colon: Secondary | ICD-10-CM

## 2010-11-21 DIAGNOSIS — D126 Benign neoplasm of colon, unspecified: Secondary | ICD-10-CM

## 2010-11-21 DIAGNOSIS — K5730 Diverticulosis of large intestine without perforation or abscess without bleeding: Secondary | ICD-10-CM

## 2010-11-21 MED ORDER — SODIUM CHLORIDE 0.9 % IV SOLN: 500.0000 mL | INTRAVENOUS | Status: DC

## 2010-11-21 NOTE — Patient Instructions (Signed)
Discharged instructions given with verbal understanding. Hand outs on polyps and diverticulosis given. Resume previous medications. 

## 2010-11-22 ENCOUNTER — Telehealth: Payer: Self-pay | Admitting: *Deleted

## 2010-11-22 NOTE — Telephone Encounter (Signed)
Follow up Call- Patient questions:  Do you have a fever, pain , or abdominal swelling? no Pain Score  0 *  Have you tolerated food without any problems? yes  Have you been able to return to your normal activities? yes  Do you have any questions about your discharge instructions: Diet   no Medications  no Follow up visit  no  Do you have questions or concerns about your Care? yes  Actions: * If pain score is 4 or above: No action needed, pain <4.  Pt had questions about diverticulosis.

## 2010-12-05 ENCOUNTER — Other Ambulatory Visit (INDEPENDENT_AMBULATORY_CARE_PROVIDER_SITE_OTHER): Payer: Self-pay | Admitting: Internal Medicine

## 2010-12-05 ENCOUNTER — Encounter: Payer: Self-pay | Admitting: Internal Medicine

## 2010-12-05 ENCOUNTER — Other Ambulatory Visit (INDEPENDENT_AMBULATORY_CARE_PROVIDER_SITE_OTHER): Payer: Self-pay

## 2010-12-05 ENCOUNTER — Telehealth: Payer: Self-pay | Admitting: *Deleted

## 2010-12-05 ENCOUNTER — Ambulatory Visit (INDEPENDENT_AMBULATORY_CARE_PROVIDER_SITE_OTHER): Payer: Self-pay | Admitting: Internal Medicine

## 2010-12-05 DIAGNOSIS — R232 Flushing: Secondary | ICD-10-CM

## 2010-12-05 DIAGNOSIS — M129 Arthropathy, unspecified: Secondary | ICD-10-CM

## 2010-12-05 DIAGNOSIS — E785 Hyperlipidemia, unspecified: Secondary | ICD-10-CM

## 2010-12-05 DIAGNOSIS — R5381 Other malaise: Secondary | ICD-10-CM

## 2010-12-05 DIAGNOSIS — I10 Essential (primary) hypertension: Secondary | ICD-10-CM

## 2010-12-05 DIAGNOSIS — R5383 Other fatigue: Secondary | ICD-10-CM | POA: Insufficient documentation

## 2010-12-05 DIAGNOSIS — Z0000 Encounter for general adult medical examination without abnormal findings: Secondary | ICD-10-CM

## 2010-12-05 LAB — URINALYSIS, ROUTINE W REFLEX MICROSCOPIC
Bilirubin Urine: NEGATIVE
Ketones, ur: NEGATIVE
Nitrite: POSITIVE
Specific Gravity, Urine: 1.01 (ref 1.000–1.030)
Total Protein, Urine: NEGATIVE
Urine Glucose: NEGATIVE
Urobilinogen, UA: 0.2 (ref 0.0–1.0)
pH: 6 (ref 5.0–8.0)

## 2010-12-05 LAB — BASIC METABOLIC PANEL
BUN: 17 mg/dL (ref 6–23)
CO2: 29 mEq/L (ref 19–32)
Calcium: 9.7 mg/dL (ref 8.4–10.5)
Chloride: 99 mEq/L (ref 96–112)
Creatinine, Ser: 0.7 mg/dL (ref 0.4–1.2)
GFR: 107.82 mL/min (ref >60.00)
Glucose, Bld: 73 mg/dL (ref 70–99)
Potassium: 4.2 mEq/L (ref 3.5–5.1)
Sodium: 138 mEq/L (ref 135–145)

## 2010-12-05 LAB — CBC WITH DIFFERENTIAL/PLATELET
Basophils Absolute: 0.1 10*3/uL (ref 0.0–0.1)
Basophils Relative: 0.4 % (ref 0.0–3.0)
Eosinophils Absolute: 0.2 10*3/uL (ref 0.0–0.7)
Eosinophils Relative: 1.1 % (ref 0.0–5.0)
HCT: 36.2 % (ref 36.0–46.0)
Hemoglobin: 12.1 g/dL (ref 12.0–15.0)
Lymphocytes Relative: 17.7 % (ref 12.0–46.0)
Lymphs Abs: 2.5 10*3/uL (ref 0.7–4.0)
MCHC: 33.5 g/dL (ref 30.0–36.0)
MCV: 88.8 fl (ref 78.0–100.0)
Monocytes Absolute: 0.6 10*3/uL (ref 0.1–1.0)
Monocytes Relative: 3.9 % (ref 3.0–12.0)
Neutro Abs: 11 10*3/uL — ABNORMAL HIGH (ref 1.4–7.7)
Neutrophils Relative %: 76.9 % (ref 43.0–77.0)
Platelets: 389 10*3/uL (ref 150.0–400.0)
RBC: 4.07 Mil/uL (ref 3.87–5.11)
RDW: 15.2 % — ABNORMAL HIGH (ref 11.5–14.6)
WBC: 14.3 10*3/uL — ABNORMAL HIGH (ref 4.5–10.5)

## 2010-12-05 LAB — LIPID PANEL
Cholesterol: 176 mg/dL (ref 0–200)
HDL: 48 mg/dL (ref >39.00)
Total CHOL/HDL Ratio: 4
Triglycerides: 274 mg/dL — ABNORMAL HIGH (ref 0.0–149.0)
VLDL: 54.8 mg/dL — ABNORMAL HIGH (ref 0.0–40.0)

## 2010-12-05 LAB — HEPATIC FUNCTION PANEL
ALT: 18 U/L (ref 0–35)
AST: 17 U/L (ref 0–37)
Albumin: 4.3 g/dL (ref 3.5–5.2)
Alkaline Phosphatase: 92 U/L (ref 39–117)
Bilirubin, Direct: 0.1 mg/dL (ref 0.0–0.3)
Total Bilirubin: 0.5 mg/dL (ref 0.3–1.2)
Total Protein: 8.3 g/dL (ref 6.0–8.3)

## 2010-12-05 LAB — LDL CHOLESTEROL, DIRECT: Direct LDL: 96.3 mg/dL

## 2010-12-05 LAB — TSH: TSH: 2.78 u[IU]/mL (ref 0.35–5.50)

## 2010-12-05 MED ORDER — MELOXICAM 15 MG PO TABS: 15.0000 mg | ORAL_TABLET | Freq: Every day | ORAL | Status: DC

## 2010-12-05 MED ORDER — CIPROFLOXACIN HCL 500 MG PO TABS: 500.0000 mg | ORAL_TABLET | Freq: Two times a day (BID) | ORAL | Status: AC

## 2010-12-05 MED ORDER — HYDROCODONE-ACETAMINOPHEN 5-500 MG PO TABS: 1.0000 | ORAL_TABLET | Freq: Every day | ORAL | Status: DC | PRN

## 2010-12-05 NOTE — Assessment & Plan Note (Signed)
Exam benign and hx nonspecific - check labs -

## 2010-12-05 NOTE — Assessment & Plan Note (Signed)
BP Readings from Last 3 Encounters:  12/05/10 134/72  11/21/10 130/77  11/02/10 142/84   The current medical regimen is reasonably effective;  continue present plan and medications.

## 2010-12-05 NOTE — Telephone Encounter (Signed)
i would prefer pt talk with gyn about hot flash issues - order for gyn refer done

## 2010-12-05 NOTE — Telephone Encounter (Signed)
Pt call back stating she forgot to ask md at ov can she prescribe something for hot flashes. Pls advise?Marland Kitchen..12/05/10@9 :54am/LMB

## 2010-12-05 NOTE — Assessment & Plan Note (Signed)
Pt requests lab check today

## 2010-12-05 NOTE — Telephone Encounter (Signed)
Also, labs show +UTI - erx cipro done; otherwise normal or stable test results. No other medication changes recommended. Thanks.

## 2010-12-05 NOTE — Progress Notes (Signed)
  Subjective:    Patient ID: Gloria [name], female    DOB: 1950/08/01, 60 y.o.   MRN: [id number]  HPI  Here for follow up - reviewed chronic medical issues today  HTN - the patient reports compliance with medication(s) as prescribed. Denies adverse side effects. No edema, chest pain or change headache pattern Migraines - uses "occassional" hydrocodone for same - 05/2010 agreed to max 30/mo but requesting more due to arthritis pain Anxiety - uses daily BZ to control same - no flares or panic symptoms  Arthritis - back and knees - using hydrocodone for this >migraines  Past Medical History  Diagnosis Date  . Depressive disorder, not elsewhere classified   . Other and unspecified hyperlipidemia   . Unspecified essential hypertension   . Migraine, unspecified, without mention of intractable migraine without mention of status migrainosus   . Unspecified urinary incontinence   . Arthritis   . Chronic bronchitis   . Anxiety state, unspecified      Review of Systems  Constitutional: Positive for fatigue. Negative for fever.  Respiratory: Positive for cough.   Cardiovascular: Negative for chest pain.  Genitourinary: Negative for dysuria and hematuria.  Musculoskeletal: Positive for back pain.  Neurological: Negative for dizziness.       Objective:   Physical Exam BP 134/72  Pulse 98  Temp(Src) 98.2 F (36.8 C) (Oral)  Ht 5\' 7"  (1.702 m)  Wt 205 lb (92.987 kg)  BMI 32.11 kg/m2  SpO2 97% Physical Exam  Constitutional: She is oriented to person, place, and time. She appears well-developed and well-nourished. No distress.  HENT: Head: Normocephalic and atraumatic. Ears; B TMs ok, no erythema or effusion; Nose: Nose normal.  Mouth/Throat: Oropharynx is clear and moist. No oropharyngeal exudate.  Eyes: Conjunctivae and EOM are normal. Pupils are equal, round, and reactive to light. No scleral icterus.  Neck: Normal range of motion. Neck supple. No JVD present. No thyromegaly present.    Cardiovascular: Normal rate, regular rhythm and normal heart sounds.  No murmur heard. No BLE edema. Pulmonary/Chest: Effort normal and breath sounds normal. No respiratory distress. She has no wheezes.  Psychiatric: She has a normal mood and affect. Her behavior is normal. Judgment and thought content normal.   Lab Results  Component Value Date   WBC 13.6* 09/25/2009   HGB 11.7 01/25/2010   HCT 34.5 01/25/2010   PLT 416 01/25/2010   CHOL 180 01/25/2010   HDL 38 01/25/2010   ALT 12 03/14/7828   AST 17 01/25/2010   NA 137 01/25/2010   K 3.8 01/25/2010   CL 98 01/25/2010   CREATININE 1.16 01/25/2010   BUN 22 01/25/2010   CO2 22 01/25/2010   TSH 2.140 01/25/2010        Assessment & Plan:  See problem list. Medications and labs reviewed today.

## 2010-12-05 NOTE — Patient Instructions (Signed)
It was good to see you today. Test(s) ordered today. Your results will be called to you after review (48-72hours after test completion). If any changes need to be made, you will be notified at that time. Will increase monthly supply of hydrocodone to #40/month - also take meloxicam every day to help with arthritis pain - Your prescription(s) have been submitted to your pharmacy. Please take as directed and contact our office if you believe you are having problem(s) with the medication(s). Other Medications reviewed, no other changes at this time. Please schedule followup in 6 months, call sooner if problems.

## 2010-12-05 NOTE — Assessment & Plan Note (Signed)
meloxicam for back and knee arthritis - also will slightly increase monthly supply of hydrocodone to #40/mo Encouraged activity and weight control

## 2010-12-06 NOTE — Telephone Encounter (Signed)
Pt advised of referral and Rx/pharmacy

## 2011-01-15 ENCOUNTER — Other Ambulatory Visit: Payer: Self-pay | Admitting: Internal Medicine

## 2011-01-16 NOTE — Telephone Encounter (Signed)
Faxed script to [facility] @ [place] @ [phone].Marland KitchenMarland Kitchen7/18/12@1 :49pm/LMB

## 2011-02-02 ENCOUNTER — Other Ambulatory Visit: Payer: Self-pay | Admitting: Internal Medicine

## 2011-02-04 NOTE — Telephone Encounter (Signed)
Faxed script back to [facility] @ [place] [phone]...02/04/11@9 :09am/LMB

## 2011-02-26 ENCOUNTER — Other Ambulatory Visit: Payer: Self-pay | Admitting: Internal Medicine

## 2011-02-28 ENCOUNTER — Ambulatory Visit (INDEPENDENT_AMBULATORY_CARE_PROVIDER_SITE_OTHER): Payer: Self-pay | Admitting: Physician Assistant

## 2011-02-28 ENCOUNTER — Encounter: Payer: Self-pay | Admitting: Physician Assistant

## 2011-02-28 DIAGNOSIS — N951 Menopausal and female climacteric states: Secondary | ICD-10-CM | POA: Insufficient documentation

## 2011-02-28 DIAGNOSIS — F419 Anxiety disorder, unspecified: Secondary | ICD-10-CM

## 2011-02-28 DIAGNOSIS — F411 Generalized anxiety disorder: Secondary | ICD-10-CM

## 2011-02-28 DIAGNOSIS — Z780 Asymptomatic menopausal state: Secondary | ICD-10-CM

## 2011-02-28 LAB — TSH: TSH: 2.149 u[IU]/mL (ref 0.350–4.500)

## 2011-02-28 MED ORDER — ESTROGENS, CONJUGATED 0.625 MG/GM VA CREA: TOPICAL_CREAM | Freq: Every day | VAGINAL | Status: DC

## 2011-02-28 MED ORDER — BUSPIRONE HCL 30 MG PO TABS: 15.0000 mg | ORAL_TABLET | Freq: Two times a day (BID) | ORAL | Status: DC

## 2011-02-28 NOTE — Patient Instructions (Signed)
Anxiety and Panic Attacks Your caregiver has informed you that you are having an anxiety or panic attack. There may be many forms of this. Most of the time these attacks come suddenly and without warning. They come at any time of day, including periods of sleep, and at any time of life. They may be strong and unexplained. Although panic attacks are very scary, they are physically harmless. Sometimes the cause of your anxiety is not known. Anxiety is a protective mechanism of the body in its fight or flight mechanism. Most of these perceived danger situations are actually nonphysical situations (such as anxiety over losing a job). CAUSES The causes of an anxiety or panic attack are many. Panic attacks may occur in otherwise healthy people given a certain set of circumstances. There may be a genetic cause for panic attacks. Some medications may also have anxiety as a side effect. SYMPTOMS Some of the most common feelings are:  Intense terror.  Dizziness, feeling faint.   Hot and cold flashes.   Fear of going crazy.   Feelings that nothing is real.   Sweating.   Shaking.   Chest pain or a fast heartbeat (palpitations).  Smothering, choking sensations.   Feelings of impending doom and that death is near.   Tingling of extremities, this may be from over breathing.   Altered reality (derealization).   Being detached from yourself (depersonalization).   Several symptoms can be present to make up anxiety or panic attacks. DIAGNOSIS The evaluation by your caregiver will depend on the type of symptoms you are experiencing. The diagnosis of anxiety or pain attack is made when no physical illness can be determined to be a cause of the symptoms. TREATMENT Treatment to prevent anxiety and panic attacks may include:  Avoidance of circumstances that cause anxiety.   Reassurance and relaxation.   Regular exercise.   Relaxation therapies, such as yoga.   Psychotherapy with a psychiatrist  or therapist.   Avoidance of caffeine, alcohol and illegal drugs.   Prescribed medication.  SEEK IMMEDIATE MEDICAL CARE IF:  You experience panic attack symptoms that are different than your usual symptoms.   You have any worsening or concerning symptoms.  Document Released: 06/17/2005 Document Re-Released: 12/05/2009 [facility] Patient Information 2011 [place], [place].Menopause Menopause is the normal time of life when menstrual periods stop completely. Menopause is complete when you have missed 12 consecutive menstrual periods. It usually occurs between the ages of 60-55, with an average age of 90. Very rarely does a woman develop menopause before 61 years old. At menopause, your ovaries stop producing the female hormones, estrogen and progesterone. This can cause undesirable symptoms and also affect your health. Sometimes the symptoms may occur 4-5 years before the menopause begins. There is no relationship between oral contraceptives, number of children you had, race or the age your menstrual periods started (menarche). Heavy smokers and very thin women may develop menopause earlier in life. CAUSE  The ovaries stop producing the female hormones estrogen and progesterone.   Other causes include:   Surgery to remove both ovaries.  The ovaries stop functioning for no known reason.   Tumors of the pituitary gland in the brain.   Medical disease that affects the ovaries and hormone production.  Radiation treatment to the abdomen or pelvis.   Chemotherapy that affects the ovaries.   SYMPTOMS  Hot flashes.  Night sweats.   Decrease in sex drive.   Vaginal dryness and shrinking of the size of the genital organs  causing painful intercourse.   Dryness of the skin and developing wrinkles.  Headaches.   Tiredness.   Irritability.   Memory problems.  Weight gain.   Bladder infections.   Hair growth of the face and chest.   Infertility.   More serious symptoms  include:  Loss of bone (osteoporosis) causing breaks (fractures).   Depression.   Hardening and narrowing of the arteries (atherosclerosis) causing heart attacks and strokes.  DIAGNOSIS  When the menstrual periods have stopped for 12 straight months.   Physical exam.   Hormone studies of the blood.  TREATMENT There are many treatment choices and nearly as many questions about them. The decisions to treat or not to treat menopausal changes is an individual decision made with your caregiver. Your caregiver can discuss the treatments with you. Together, you can decide which treatment will work best for you such as:  Hormone replacement treatment.  Treat the individual symptoms with medication (ex. tranquilizer for depression).   Herbal medications that may help specific symptoms.  Counseling by a psychiatrist or psychologist.   Group therapy.   No treatment.   HOME CARE INSTRUCTIONS  Take the medication your caregiver gives you as directed.   Get plenty of sleep and rest.   Exercise regularly.   Eat a diet that contains calcium (good for the bones) and soy products (acts like estrogen hormone).   Avoid alcoholic beverages.   Do not smoke.   Taking vitamin E may help in certain cases.   If you have hot flashes, dress in layers.   Take supplements, calcium and vitamin D to strengthen bones.   You can use over-the-counter vaginal cream for vaginal dryness.   Group therapy is sometimes very helpful.   Acupuncture may be helpful in some cases.  SEEK MEDICAL CARE IF:  You are not sure you are in the menopause.   You are having menopausal symptoms and need advice and treatment.   You are still having menstrual periods after age 68.   You have pain with intercourse.   You are in the menopause (no menstrual period for 12 months) and develop vaginal bleeding.   You need a referral to a specialist (gynecologist, psychiatrist or psychologist) for treatment.  SEEK  IMMEDIATE MEDICAL CARE IF:  You have severe depression.   You have excessive vaginal bleeding.   You fell and think you have a broken bone.   You have pain when you urinate.   You develop leg or chest pain.   You have a fast pounding heart beat (palpitations).   You have severe headaches.   You develop vision problems.   You feel a lump in your breast.   You have abdominal pain or severe indigestion.  Document Released: 09/07/2003 Document Re-Released: 04/19/2008 [facility] Patient Information 2011 [place], [place].

## 2011-02-28 NOTE — Progress Notes (Signed)
Chief Complaint:  Referral  Gloria [name] is  60 y.o. G1P1001.  No LMP recorded. Patient has had a hysterectomy..   She presents complaining of hot flashes and anxiety. Onset is described as ongoing and has been present for  30 years, it has gradually gotten worse.  She was on an estrogen patch immediately after her hysterectomy, but only took for about 2 months because she was afraid of the cancer risks associated.  Is being treated for anxiety by primary care with Xanax, this helps to relieve the issue. She reports taking 2-3 xanax daily to control symptoms and aid in sleep.  Past Medical History: Past Medical History  Diagnosis Date  . Depressive disorder, not elsewhere classified   . Other and unspecified hyperlipidemia   . Unspecified essential hypertension   . Migraine, unspecified, without mention of intractable migraine without mention of status migrainosus   . Unspecified urinary incontinence   . Arthritis   . Chronic bronchitis   . Anxiety state, unspecified     Past Surgical History: Past Surgical History  Procedure Date  . Abdominal hysterectomy 2002  . Dislocated shoulder     right  . Diagnostic laparoscopy   . Tumor right ankle     Family History: Family History  Problem Relation Age of Onset  . Arthritis      family history  . Hypertension      grandparent  . Colon cancer Maternal Grandfather     Social History: History  Substance Use Topics  . Smoking status: Never Smoker   . Smokeless tobacco: Never Used  . Alcohol Use: No    Allergies:  Allergies  Allergen Reactions  . Codeine Itching  . Compazine     Stroke like symptoms  . Haloperidol Decanoate Other (See Comments)    Extrapyramidal syndrome  . Penicillins Nausea And Vomiting     (Not in a hospital admission)  Review of Systems - Negative except see above.  Physical Exam   Blood pressure 154/98, pulse 100, temperature 99.7 F (37.6 C), temperature source Oral, height 5\' 7"  (1.702 m),  weight 202 lb 9.6 oz (91.899 kg).  General: General appearance - alert, well appearing, and in no distress  Labs: Awaiting TSH levels.  Assessment: Patient Active Problem List  Diagnoses  . HYPERLIPIDEMIA  . ANXIETY  . DEPRESSION  . MIGRAINE HEADACHE  . HYPERTENSION  . ARTHRITIS  . Asthma  . Preventative health care  . Fatigue  . Menopause  . Hot flashes, menopausal    Plan: Pt. Presents with postmenopausal symptoms.   Start patient on HRT Try Buspar for long term anxiety control. F/U in 4 weeks to check how pills are working and check BP. Pt. History was reviewed and estrogen warnings discussed.  Emre Stock E. 02/28/2011,1:56 PM

## 2011-03-22 ENCOUNTER — Other Ambulatory Visit: Payer: Self-pay | Admitting: Internal Medicine

## 2011-03-26 ENCOUNTER — Other Ambulatory Visit: Payer: Self-pay | Admitting: Internal Medicine

## 2011-03-27 NOTE — Telephone Encounter (Signed)
Faxed script back to [facility] @ [phone]....03/27/11@2 :41pm/LMB

## 2011-03-28 ENCOUNTER — Ambulatory Visit: Payer: Self-pay | Admitting: Family Medicine

## 2011-04-08 ENCOUNTER — Other Ambulatory Visit: Payer: Self-pay | Admitting: *Deleted

## 2011-04-08 MED ORDER — LISINOPRIL-HYDROCHLOROTHIAZIDE 20-25 MG PO TABS: 1.0000 | ORAL_TABLET | Freq: Every day | ORAL | Status: DC

## 2011-04-08 NOTE — Telephone Encounter (Signed)
Received fax stating pt want 90 day supply on her Lisinopril 20/25mg . Sent 90 day to [facility]....04/08/11@2 :39pm/LMB

## 2011-04-10 ENCOUNTER — Ambulatory Visit: Payer: Self-pay | Admitting: Internal Medicine

## 2011-04-10 ENCOUNTER — Encounter: Payer: Self-pay | Admitting: Internal Medicine

## 2011-04-10 ENCOUNTER — Ambulatory Visit (INDEPENDENT_AMBULATORY_CARE_PROVIDER_SITE_OTHER): Payer: Self-pay | Admitting: Internal Medicine

## 2011-04-10 DIAGNOSIS — I10 Essential (primary) hypertension: Secondary | ICD-10-CM

## 2011-04-10 DIAGNOSIS — F411 Generalized anxiety disorder: Secondary | ICD-10-CM

## 2011-04-10 DIAGNOSIS — M129 Arthropathy, unspecified: Secondary | ICD-10-CM

## 2011-04-10 MED ORDER — SERTRALINE HCL 25 MG PO TABS: 25.0000 mg | ORAL_TABLET | Freq: Every day | ORAL | Status: DC

## 2011-04-10 MED ORDER — HYDROCODONE-ACETAMINOPHEN 5-500 MG PO TABS: 1.0000 | ORAL_TABLET | Freq: Every day | ORAL | Status: DC | PRN

## 2011-04-10 NOTE — Assessment & Plan Note (Signed)
Intolerant of buspar - crisis with son getting DUI Will start sertraline now and titrate up as tolerated

## 2011-04-10 NOTE — Assessment & Plan Note (Signed)
BP Readings from Last 3 Encounters:  04/10/11 122/88  02/28/11 154/98  12/05/10 134/72   The current medical regimen is reasonably effective;  continue present plan and medications.

## 2011-04-10 NOTE — Patient Instructions (Signed)
It was good to see you today. Start sertraline for anxiety and hot flashes and pain symptoms - Also refill today monthly supply of your hydrocodone to #40/month -  Your prescription(s) have been submitted to your pharmacy. Please take as directed and contact our office if you believe you are having problem(s) with the medication(s). Other Medications reviewed, no other changes at this time. Please schedule followup in 6 months, call sooner if problems.

## 2011-04-10 NOTE — Progress Notes (Signed)
  Subjective:    Patient ID: Gloria [name], female    DOB: 09/26/1950, 60 y.o.   MRN: [id number]  HPI  Here for increase "nerves"  Also reviewed chronic medical issues today  HTN - the patient reports compliance with medication(s) as prescribed. Denies adverse side effects. No edema, chest pain or change headache pattern  Migraines - uses "occassional" hydrocodone for same - 05/2010 agreed to max 30/mo but increased to 40#/mo 09/2010 due to arthritis pain  Anxiety - uses daily BZ to control same , gyn added buspar, 6/12 - ineffective (see above)- no panic symptoms   Arthritis - back and knees - using hydrocodone for this >migraines; also meloxicam   Past Medical History  Diagnosis Date  . Depressive disorder, not elsewhere classified   . Other and unspecified hyperlipidemia   . Unspecified essential hypertension   . Migraine, unspecified, without mention of intractable migraine without mention of status migrainosus   . Unspecified urinary incontinence   . Arthritis   . Chronic bronchitis   . Anxiety state, unspecified   . Postmenopausal symptoms      Review of Systems  Constitutional: Positive for fatigue. Negative for fever.  Cardiovascular: Negative for chest pain.  Genitourinary: Negative for dysuria and hematuria.  Musculoskeletal: Positive for back pain.  Neurological: Negative for dizziness.       Objective:   Physical Exam  BP 122/88  Pulse 82  Temp(Src) 98.6 F (37 C) (Oral)  Ht 5\' 6"  (1.676 m)  Wt 204 lb 12.8 oz (92.897 kg)  BMI 33.06 kg/m2  SpO2 97%  Constitutional: She is overweight. She appears well-developed and well-nourished. No distress.  HENT: Head: Normocephalic and atraumatic. Ears; B TMs ok, no erythema or effusion; Nose: Nose normal.  Mouth/Throat: Oropharynx is clear and moist. No oropharyngeal exudate.  Eyes: Conjunctivae and EOM are normal. Pupils are equal, round, and reactive to light. No scleral icterus.  Neck: Normal range of motion.  Neck supple. No JVD present. No thyromegaly present.  Cardiovascular: Normal rate, regular rhythm and normal heart sounds.  No murmur heard. No BLE edema. Pulmonary/Chest: Effort normal and breath sounds normal. No respiratory distress. She has no wheezes.  Psychiatric: She has an anxious mood and tearful affect. Her behavior is normal. Judgment and thought content normal.   Lab Results  Component Value Date   WBC 14.3* 12/05/2010   HGB 12.1 12/05/2010   HCT 36.2 12/05/2010   PLT 389.0 12/05/2010   CHOL 176 12/05/2010   TRIG 274.0* 12/05/2010   HDL 48.00 12/05/2010   LDLDIRECT 96.3 12/05/2010   ALT 18 12/05/2010   AST 17 12/05/2010   NA 138 12/05/2010   K 4.2 12/05/2010   CL 99 12/05/2010   CREATININE 0.7 12/05/2010   BUN 17 12/05/2010   CO2 29 12/05/2010   TSH 2.149 02/28/2011        Assessment & Plan:  See problem list. Medications and labs reviewed today.

## 2011-04-10 NOTE — Assessment & Plan Note (Signed)
BP Readings from Last 3 Encounters:  04/10/11 122/88  02/28/11 154/98  12/05/10 134/72   The current medical regimen is reasonably effective;  continue present plan and medications.  Increase in monthly supply denied, but early refill allowed today as "lost meds" last week with stress of son's DUI

## 2011-05-14 ENCOUNTER — Other Ambulatory Visit: Payer: Self-pay | Admitting: Internal Medicine

## 2011-06-03 ENCOUNTER — Other Ambulatory Visit: Payer: Self-pay | Admitting: Internal Medicine

## 2011-06-04 ENCOUNTER — Ambulatory Visit: Payer: Self-pay | Admitting: Internal Medicine

## 2011-06-23 ENCOUNTER — Other Ambulatory Visit: Payer: Self-pay | Admitting: Internal Medicine

## 2011-06-24 NOTE — Telephone Encounter (Signed)
Alprazolam request [last refill 11.1312 #[id number]]

## 2011-06-26 NOTE — Telephone Encounter (Signed)
Refill ok? 

## 2011-06-26 NOTE — Telephone Encounter (Signed)
[facility]ed [facility] @ [phone]...06/26/11@2 :16pm/LMB

## 2011-07-24 ENCOUNTER — Other Ambulatory Visit: Payer: Self-pay | Admitting: Internal Medicine

## 2011-07-25 NOTE — Telephone Encounter (Signed)
[place] control subst registry and refills reviewed - pt using more than 40pill/month which was our agreed amount at 11/2010 OV- new rx done with note on rx to pharmacist to provide no more than 40 tab/month, no early refills -

## 2011-07-28 ENCOUNTER — Other Ambulatory Visit: Payer: Self-pay | Admitting: Internal Medicine

## 2011-07-30 NOTE — Telephone Encounter (Signed)
[facility]...07/30/11@9 :11am/LMB

## 2011-08-06 ENCOUNTER — Other Ambulatory Visit: Payer: Self-pay | Admitting: Internal Medicine

## 2011-08-06 NOTE — Telephone Encounter (Signed)
F[facility]ed sscript [facility] to [facility] @ [phone]... 08/06/11@2 :18pm/LMB

## 2011-08-20 ENCOUNTER — Telehealth: Payer: Self-pay | Admitting: *Deleted

## 2011-08-20 NOTE — Telephone Encounter (Signed)
Pt called requesting md to rx something for bronchitis. Inform pt that she would have to be seen before md will be able to rx anything. Transfered to schedulers to make appt... 08/20/11@1 :29pm/LMB

## 2011-08-21 ENCOUNTER — Ambulatory Visit (INDEPENDENT_AMBULATORY_CARE_PROVIDER_SITE_OTHER): Payer: Self-pay | Admitting: Internal Medicine

## 2011-08-21 ENCOUNTER — Encounter: Payer: Self-pay | Admitting: Internal Medicine

## 2011-08-21 VITALS — BP 140/82 | HR 96 | Temp 98.8°F | Ht 66.0 in | Wt 202.6 lb

## 2011-08-21 DIAGNOSIS — J069 Acute upper respiratory infection, unspecified: Secondary | ICD-10-CM

## 2011-08-21 DIAGNOSIS — J45901 Unspecified asthma with (acute) exacerbation: Secondary | ICD-10-CM

## 2011-08-21 MED ORDER — AZITHROMYCIN 250 MG PO TABS: ORAL_TABLET | ORAL | Status: AC

## 2011-08-21 MED ORDER — ALBUTEROL SULFATE (2.5 MG/3ML) 0.083% IN NEBU: 2.5000 mg | INHALATION_SOLUTION | RESPIRATORY_TRACT | Status: DC | PRN

## 2011-08-21 MED ORDER — METHYLPREDNISOLONE ACETATE 80 MG/ML IJ SUSP
120.0000 mg | Freq: Once | INTRAMUSCULAR | Status: AC
  Administered 2011-08-21: 120 mg via INTRAMUSCULAR

## 2011-08-21 MED ORDER — AZITHROMYCIN 250 MG PO TABS: 250.0000 mg | ORAL_TABLET | Freq: Every day | ORAL | Status: DC

## 2011-08-21 NOTE — Progress Notes (Signed)
  Subjective:    HPI  complains of chest cold symptoms  Onset >2 week ago, progressive symptoms  associated with rhinorrhea, sneezing, sore throat, mild headache and low grade fever Also myalgias, sinus pressure and mild-mod chest congestion with wheezing, shortness of breath on exertion and nocturnal cough No relief with OTC meds Precipitated by sick contacts  Past Medical History  Diagnosis Date  . Depressive disorder, not elsewhere classified   . Other and unspecified hyperlipidemia   . Unspecified essential hypertension   . Migraine, unspecified, without mention of intractable migraine without mention of status migrainosus   . Unspecified urinary incontinence   . Arthritis   . Chronic bronchitis   . Anxiety state, unspecified   . Postmenopausal symptoms     Review of Systems Constitutional: No night sweats, no unexpected weight change Pulmonary: No pleurisy or hemoptysis Cardiovascular: No chest pain or palpitations     Objective:   Physical Exam BP 140/82  Pulse 96  Temp(Src) 98.8 F (37.1 C) (Oral)  Ht 5\' 6"  (1.676 m)  Wt 202 lb 9.6 oz (91.899 kg)  BMI 32.70 kg/m2  SpO2 97% GEN: mildly ill appearing and audible head/chest congestion HENT: NCAT, mild sinus tenderness bilaterally, nares with clear discharge, oropharynx mild erythema, no exudate Eyes: Vision grossly intact, no conjunctivitis Lungs: Scattered bibasilar rhonchi with mild end expiratory wheeze, no increased work of breathing at rest Cardiovascular: Regular rate and rhythm, no bilateral edema      Assessment & Plan:  Viral URI  acute asthma exacerbation with bronchitis due to same Cough, postnasal drip related to above   Empiric antibiotics prescribed due to symptom duration greater than 7 days IM steroid today, refill Alb nebs and OTC cough suppression - new prescriptions done Symptomatic care with Tylenol or Advil, hydration and rest -  salt gargle advised as needed

## 2011-08-21 NOTE — Patient Instructions (Signed)
It was good to see you today. Steroid shot given to you today Z-Pak antibiotics and refill on albuterol nebulizer medications - Your prescription(s) have been submitted to your pharmacy. Please take as directed and contact our office if you believe you are having problem(s) with the medication(s).

## 2011-08-26 ENCOUNTER — Other Ambulatory Visit: Payer: Self-pay | Admitting: Internal Medicine

## 2011-09-05 ENCOUNTER — Telehealth: Payer: Self-pay | Admitting: Internal Medicine

## 2011-09-05 NOTE — Telephone Encounter (Signed)
Pt has had a lot on her plate, has had 3 family deaths recently and is having major anxiety. Request her meds to filled early Gloria [name] Prudy Feeler) 1 MG, [facility] [address]

## 2011-09-06 NOTE — Telephone Encounter (Signed)
One time early refill ok - thanks

## 2011-09-06 NOTE — Telephone Encounter (Signed)
Notified pharmacy spoke with Alease per md ok 1 time only, also notified pt.... 09/06/11@12 :21pm/LMB

## 2011-09-10 ENCOUNTER — Encounter: Payer: Self-pay | Admitting: Endocrinology

## 2011-09-10 ENCOUNTER — Other Ambulatory Visit: Payer: Self-pay | Admitting: Internal Medicine

## 2011-09-10 ENCOUNTER — Ambulatory Visit (INDEPENDENT_AMBULATORY_CARE_PROVIDER_SITE_OTHER): Payer: Self-pay | Admitting: Endocrinology

## 2011-09-10 VITALS — BP 132/84 | HR 88 | Temp 98.2°F | Wt 210.4 lb

## 2011-09-10 DIAGNOSIS — G43909 Migraine, unspecified, not intractable, without status migrainosus: Secondary | ICD-10-CM

## 2011-09-10 MED ORDER — BUTALBITAL-APAP-CAFFEINE 50-325-40 MG PO TABS: 1.0000 | ORAL_TABLET | Freq: Two times a day (BID) | ORAL | Status: AC | PRN

## 2011-09-10 MED ORDER — MEPERIDINE HCL 100 MG/ML IJ SOLN
200.0000 mg | Freq: Once | INTRAMUSCULAR | Status: AC
  Administered 2011-09-10: 200 mg via INTRAMUSCULAR

## 2011-09-10 MED ORDER — MEPERIDINE HCL 50 MG PO TABS: 50.0000 mg | ORAL_TABLET | ORAL | Status: AC | PRN

## 2011-09-10 MED ORDER — PROMETHAZINE HCL 25 MG/ML IJ SOLN
25.0000 mg | Freq: Once | INTRAMUSCULAR | Status: AC
  Administered 2011-09-10: 25 mg via INTRAMUSCULAR

## 2011-09-10 NOTE — Telephone Encounter (Signed)
The pt called and stated she needs a medicine for a migraine called in.  She was offered an appointment this afternoon with a different dr, but stated she was unable to come in.  She is hoping for medicine to be called into the [facility] pharmacy.    Thank you!

## 2011-09-10 NOTE — Telephone Encounter (Signed)
esgic as needed - OV if worse or unimproved

## 2011-09-10 NOTE — Progress Notes (Signed)
Subjective:    Patient ID: Gloria [name], female    DOB: [birth year], 61 y.o.   MRN: [id number]  HPI Pt states many years of headache.  Current headache is present x 5 days.  It is severe, pulsatile quality, and is worst at the right frontal area.  There is assoc n/v.  No help with esgic or vicodin.  She saw headache specialist in the past.  These meds were recommended.   Past Medical History  Diagnosis Date  . Depressive disorder, not elsewhere classified   . Other and unspecified hyperlipidemia   . Unspecified essential hypertension   . Migraine, unspecified, without mention of intractable migraine without mention of status migrainosus   . Unspecified urinary incontinence   . Arthritis   . Chronic bronchitis   . Anxiety state, unspecified   . Postmenopausal symptoms     Past Surgical History  Procedure Date  . Abdominal hysterectomy 2002  . Dislocated shoulder     right  . Diagnostic laparoscopy   . Tumor right ankle     History   Social History  . Marital Status: Divorced    Spouse Name: N/A    Number of Children: 1  . Years of Education: N/A   Occupational History  . Self employed-interior design/flower arrangements    Social History Main Topics  . Smoking status: Never Smoker   . Smokeless tobacco: Never Used  . Alcohol Use: No  . Drug Use: No  . Sexually Active: Not on file   Other Topics Concern  . Not on file   Social History Narrative  . No narrative on file    Current Outpatient Prescriptions on File Prior to Visit  Medication Sig Dispense Refill  . albuterol (PROVENTIL) (2.5 MG/3ML) 0.083% nebulizer solution Take 3 mLs (2.5 mg total) by nebulization every 4 (four) hours as needed for wheezing.  25 vial  2  . ALPRAZolam (XANAX) 1 MG tablet TAKE ONE TABLET BY MOUTH THREE TIMES DAILY AS NEEDED FOR ANXIETY AND PANIC SYMPTOMS.  90 tablet  3  . CALCIUM-MAGNESIUM-ZINC PO Take 1 capsule by mouth daily.        . Cyanocobalamin (VITAMIN B 12 PO) Take 1 capsule  by mouth daily.        Marland Kitchen HYDROcodone-acetaminophen (VICODIN) 5-500 MG per tablet Take 1 tablet by mouth 2 (two) times daily as needed for pain. No early refills  40 tablet  3  . lisinopril-hydrochlorothiazide (PRINZIDE,ZESTORETIC) 20-25 MG per tablet Take 1 tablet by mouth daily.  90 tablet  1  . meloxicam (MOBIC) 15 MG tablet TAKE ONE TABLET BY MOUTH EVERY DAY  30 tablet  5  . Multiple Vitamins-Minerals (CENTRUM SILVER PO) Take 1 capsule by mouth daily.        . Omega-3 Fatty Acids (FISH OIL) 1200 MG CAPS Take 1 capsule by mouth daily.        . butalbital-acetaminophen-caffeine (ESGIC) 50-325-40 MG per tablet Take 1 tablet by mouth 2 (two) times daily as needed for headache.  40 tablet  0  . sertraline (ZOLOFT) 25 MG tablet Take 1 tablet (25 mg total) by mouth daily.  30 tablet  2    Allergies  Allergen Reactions  . Codeine Itching  . Compazine     Stroke like symptoms  . Haloperidol Decanoate Other (See Comments)    Extrapyramidal syndrome  . Penicillins Nausea And Vomiting    Family History  Problem Relation Age of Onset  . Arthritis  family history  . Hypertension      grandparent  . Colon cancer Maternal Grandfather     BP 132/84  Pulse 88  Temp(Src) 98.2 F (36.8 C) (Oral)  Wt 210 lb 6.4 oz (95.437 kg)  SpO2 98%  Review of Systems Denies fever, but she has slight blurry vision.      Objective:   Physical Exam VITAL SIGNS:  See vs page GENERAL: no distress head: no deformity eyes: no periorbital swelling, no proptosis external nose and ears are normal mouth: no lesion seen.   Demerol 100 mg IM, and phenergan 25 mg IM 4: 55 PM--please repeat demerol 100 mg IM    Assessment & Plan:  exac of migraine, uncertain etiology

## 2011-09-10 NOTE — Telephone Encounter (Signed)
Notified pt with md response... 09/10/11@1 :36pm/LMB

## 2011-09-10 NOTE — Patient Instructions (Signed)
Here is a prescription for headache I hope you feel better soon.  If you don't feel better by next week, please call back.

## 2011-10-09 ENCOUNTER — Ambulatory Visit: Payer: Self-pay | Admitting: Internal Medicine

## 2011-10-09 DIAGNOSIS — Z0289 Encounter for other administrative examinations: Secondary | ICD-10-CM

## 2011-10-14 ENCOUNTER — Other Ambulatory Visit: Payer: Self-pay | Admitting: Internal Medicine

## 2011-10-14 ENCOUNTER — Ambulatory Visit (INDEPENDENT_AMBULATORY_CARE_PROVIDER_SITE_OTHER): Payer: Self-pay | Admitting: Endocrinology

## 2011-10-14 ENCOUNTER — Encounter: Payer: Self-pay | Admitting: Endocrinology

## 2011-10-14 VITALS — BP 132/82 | HR 99 | Temp 98.4°F | Ht 67.0 in | Wt 202.0 lb

## 2011-10-14 DIAGNOSIS — G43909 Migraine, unspecified, not intractable, without status migrainosus: Secondary | ICD-10-CM

## 2011-10-14 MED ORDER — PROMETHAZINE HCL 12.5 MG PO TABS: ORAL_TABLET | ORAL | Status: DC

## 2011-10-14 MED ORDER — MEPERIDINE HCL 50 MG PO TABS: 50.0000 mg | ORAL_TABLET | ORAL | Status: AC | PRN

## 2011-10-14 MED ORDER — MEPERIDINE HCL 100 MG/ML IJ SOLN
100.0000 mg | Freq: Once | INTRAMUSCULAR | Status: AC
  Administered 2011-10-14: 100 mg via INTRAMUSCULAR

## 2011-10-14 MED ORDER — MEPERIDINE HCL 100 MG/ML IJ SOLN: 100.0000 mg | Freq: Once | INTRAMUSCULAR | Status: DC

## 2011-10-14 MED ORDER — PROMETHAZINE HCL 25 MG/ML IJ SOLN
25.0000 mg | Freq: Once | INTRAMUSCULAR | Status: AC
  Administered 2011-10-14: 25 mg via INTRAMUSCULAR

## 2011-10-14 NOTE — Patient Instructions (Addendum)
i have sent a prescription to your pharmacy, for "phenergan." Refer to a neurology specialist.  you will receive a phone call, about a day and time for an appointment I hope you feel better soon.  If not, please call back.  Here is a prescription for demerol.

## 2011-10-14 NOTE — Telephone Encounter (Signed)
Rx faxed to pharmacy  

## 2011-10-14 NOTE — Progress Notes (Signed)
Subjective:    Patient ID: Gloria [name], female    DOB: 12-23-1950, 61 y.o.   MRN: [id number]  HPI Pt states many years of headache. Current headache is present x 4 days. It is severe, pulsatile quality, and is worst at the right frontal area. There is assoc n/v and photophobia. She is unable to tell if vicodin helped, as she vomited this up. She saw headache specialist in the past.  Past Medical History  Diagnosis Date  . Depressive disorder, not elsewhere classified   . Other and unspecified hyperlipidemia   . Unspecified essential hypertension   . Migraine, unspecified, without mention of intractable migraine without mention of status migrainosus   . Unspecified urinary incontinence   . Arthritis   . Chronic bronchitis   . Anxiety state, unspecified   . Postmenopausal symptoms     Past Surgical History  Procedure Date  . Abdominal hysterectomy 2002  . Dislocated shoulder     right  . Diagnostic laparoscopy   . Tumor right ankle     History   Social History  . Marital Status: Divorced    Spouse Name: N/A    Number of Children: 1  . Years of Education: N/A   Occupational History  . Self employed-interior design/flower arrangements    Social History Main Topics  . Smoking status: Never Smoker   . Smokeless tobacco: Never Used  . Alcohol Use: No  . Drug Use: No  . Sexually Active: Not on file   Other Topics Concern  . Not on file   Social History Narrative  . No narrative on file    Current Outpatient Prescriptions on File Prior to Visit  Medication Sig Dispense Refill  . albuterol (PROVENTIL) (2.5 MG/3ML) 0.083% nebulizer solution Take 3 mLs (2.5 mg total) by nebulization every 4 (four) hours as needed for wheezing.  25 vial  2  . ALPRAZolam (XANAX) 1 MG tablet TAKE ONE TABLET BY MOUTH THREE TIMES DAILY AS NEEDED FOR ANXIETY AND PANIC SYMPTOMS.  90 tablet  3  . CALCIUM-MAGNESIUM-ZINC PO Take 1 capsule by mouth daily.        . Cyanocobalamin (VITAMIN B 12 PO)  Take 1 capsule by mouth daily.        Marland Kitchen HYDROcodone-acetaminophen (VICODIN) 5-500 MG per tablet TAKE ONE TABLET BY MOUTH TWICE DAILY AS NEEDED FOR PAIN. NO EARLY REFILLS.  40 tablet  3  . lisinopril-hydrochlorothiazide (PRINZIDE,ZESTORETIC) 20-25 MG per tablet TAKE ONE TABLET BY MOUTH DAILY.  90 tablet  1  . meloxicam (MOBIC) 15 MG tablet TAKE ONE TABLET BY MOUTH EVERY DAY  30 tablet  5  . Multiple Vitamins-Minerals (CENTRUM SILVER PO) Take 1 capsule by mouth daily.        . Omega-3 Fatty Acids (FISH OIL) 1200 MG CAPS Take 1 capsule by mouth daily.        . sertraline (ZOLOFT) 25 MG tablet Take 1 tablet (25 mg total) by mouth daily.  30 tablet  2  . promethazine (PHENERGAN) 12.5 MG tablet 1-2 pills every 4 hr as needed for nausea.  50 tablet  1    Allergies  Allergen Reactions  . Codeine Itching  . Compazine     Stroke like symptoms  . Haloperidol Decanoate Other (See Comments)    Extrapyramidal syndrome  . Penicillins Nausea And Vomiting    Family History  Problem Relation Age of Onset  . Arthritis      family history  . Hypertension  grandparent  . Colon cancer Maternal Grandfather     BP 132/82  Pulse 99  Temp(Src) 98.4 F (36.9 C) (Oral)  Ht 5\' 7"  (1.702 m)  Wt 202 lb (91.627 kg)  BMI 31.64 kg/m2  SpO2 99%   Review of Systems Denies fever and visual loss    Objective:   Physical Exam VITAL SIGNS:  See vs page GENERAL: no distress head: no deformity.  nontender eyes: no periorbital swelling, no proptosis external nose and ears are normal mouth: no lesion seen.   Neuro: cn 2-12 intact bilaterally. PSYCH: Alert and oriented x 3.  Does not appear anxious nor depressed.     Assessment & Plan:  Headache, recurrent Demerol 1000 mg, and phenergan 25 mg 15:40---please repeat demerol 100 mg (but not the phenergan)

## 2011-10-15 ENCOUNTER — Encounter: Payer: Self-pay | Admitting: Neurology

## 2011-11-14 ENCOUNTER — Ambulatory Visit (INDEPENDENT_AMBULATORY_CARE_PROVIDER_SITE_OTHER)
Admission: RE | Admit: 2011-11-14 | Discharge: 2011-11-14 | Disposition: A | Discharge status: Home / Self Care | Payer: Self-pay | Source: Ambulatory Visit | Attending: Internal Medicine | Admitting: Internal Medicine

## 2011-11-14 ENCOUNTER — Encounter: Payer: Self-pay | Admitting: Internal Medicine

## 2011-11-14 ENCOUNTER — Ambulatory Visit (INDEPENDENT_AMBULATORY_CARE_PROVIDER_SITE_OTHER): Payer: Self-pay | Admitting: Internal Medicine

## 2011-11-14 VITALS — BP 140/82 | HR 115 | Temp 98.6°F | Ht 66.0 in | Wt 198.0 lb

## 2011-11-14 DIAGNOSIS — M5412 Radiculopathy, cervical region: Secondary | ICD-10-CM

## 2011-11-14 MED ORDER — CYCLOBENZAPRINE HCL 10 MG PO TABS: 10.0000 mg | ORAL_TABLET | Freq: Three times a day (TID) | ORAL | Status: DC | PRN

## 2011-11-14 MED ORDER — PREDNISONE (PAK) 10 MG PO TABS: 10.0000 mg | ORAL_TABLET | ORAL | Status: AC

## 2011-11-14 MED ORDER — KETOROLAC TROMETHAMINE 60 MG/2ML IM SOLN
60.0000 mg | Freq: Once | INTRAMUSCULAR | Status: AC
  Administered 2011-11-14: 60 mg via INTRAMUSCULAR

## 2011-11-14 NOTE — Progress Notes (Signed)
  Subjective:    Patient ID: Gloria [name], female    DOB: 25-Dec-1950, 61 y.o.   MRN: [id number]  HPI complains of LUE pain Pain is severe 9/10 Sudden onset 5 days ago Radiates into L shoulder blade to L posterior tricep to elbow Denies injury or fall No fever Pain relieved holding arm up over head  Past Medical History  Diagnosis Date  . Depressive disorder, not elsewhere classified   . Other and unspecified hyperlipidemia   . Unspecified essential hypertension   . Migraine, unspecified, without mention of intractable migraine without mention of status migrainosus   . Unspecified urinary incontinence   . Arthritis   . Chronic bronchitis   . Anxiety state, unspecified   . Postmenopausal symptoms      Review of Systems  Constitutional: Negative for fever, fatigue and unexpected weight change.  Neurological: Positive for headaches (chronic migraines w/o change). Negative for seizures, syncope, speech difficulty and weakness.       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

## 2011-11-14 NOTE — Patient Instructions (Signed)
It was good to see you today. Tordol shot given for pain today Use pred taper next 12 days for inflammation/pain Flexeril muscle relaxer and ibuprofen + hydrocodone as discussed (will notify your pharm ok for early refill) Test(s) ordered today. Your results will be called to you after review (48-72hours after test completion). If any changes need to be made, you will be notified at that time. Will plan refer to surgical specialist for your neck if pain unimproved - call if worse

## 2011-11-21 ENCOUNTER — Other Ambulatory Visit: Payer: Self-pay | Admitting: Internal Medicine

## 2011-11-21 ENCOUNTER — Other Ambulatory Visit: Payer: Self-pay

## 2011-11-21 MED ORDER — ALPRAZOLAM 1 MG PO TABS: 1.0000 mg | ORAL_TABLET | Freq: Three times a day (TID) | ORAL | Status: DC | PRN

## 2011-11-21 NOTE — Telephone Encounter (Signed)
Rx faxed to pharmacy  

## 2011-11-22 ENCOUNTER — Encounter: Payer: Self-pay | Admitting: Endocrinology

## 2011-11-22 ENCOUNTER — Ambulatory Visit (INDEPENDENT_AMBULATORY_CARE_PROVIDER_SITE_OTHER): Payer: Self-pay | Admitting: Endocrinology

## 2011-11-22 VITALS — BP 128/74 | HR 119 | Temp 98.2°F | Ht 66.0 in | Wt 202.0 lb

## 2011-11-22 DIAGNOSIS — G43909 Migraine, unspecified, not intractable, without status migrainosus: Secondary | ICD-10-CM

## 2011-11-22 DIAGNOSIS — R51 Headache: Secondary | ICD-10-CM

## 2011-11-22 MED ORDER — PROMETHAZINE HCL 25 MG/ML IJ SOLN
25.0000 mg | Freq: Once | INTRAMUSCULAR | Status: AC
  Administered 2011-11-22: 25 mg via INTRAMUSCULAR

## 2011-11-22 MED ORDER — KETOROLAC TROMETHAMINE 60 MG/2ML IM SOLN
60.0000 mg | Freq: Once | INTRAMUSCULAR | Status: AC
  Administered 2011-11-22: 60 mg via INTRAMUSCULAR

## 2011-11-22 NOTE — Patient Instructions (Signed)
Please consider seeing the neurologist.  We have one upstairs. I hope you feel better soon.

## 2011-11-22 NOTE — Progress Notes (Signed)
Subjective:    Patient ID: Gloria [name], female    DOB: 09/02/1950, 61 y.o.   MRN: [id number]  HPI Pt states many years of headache. Current headache is present x 3 days. It is severe, pulsatile quality, and is worst at the right frontal area. There is assoc n/v and photophobia. She has used up her vicodin.  She saw headache specialist in the past.  Pt says she did not go to the neurologist i referred her to, due to lack of insurance.  i told her that we have a neurologist here, and he says she is willing to go there. She also says she doesn't have enough xanax to get to her next scheduled refill date.   Past Medical History  Diagnosis Date  . Depressive disorder, not elsewhere classified   . Other and unspecified hyperlipidemia   . Unspecified essential hypertension   . Migraine, unspecified, without mention of intractable migraine without mention of status migrainosus   . Unspecified urinary incontinence   . Arthritis   . Chronic bronchitis   . Anxiety state, unspecified   . Postmenopausal symptoms     Past Surgical History  Procedure Date  . Abdominal hysterectomy 2002  . Dislocated shoulder     right  . Diagnostic laparoscopy   . Tumor right ankle     History   Social History  . Marital Status: Divorced    Spouse Name: N/A    Number of Children: 1  . Years of Education: N/A   Occupational History  . Self employed-interior design/flower arrangements    Social History Main Topics  . Smoking status: Never Smoker   . Smokeless tobacco: Never Used  . Alcohol Use: No  . Drug Use: No  . Sexually Active: Not on file   Other Topics Concern  . Not on file   Social History Narrative  . No narrative on file    Current Outpatient Prescriptions on File Prior to Visit  Medication Sig Dispense Refill  . albuterol (PROVENTIL) (2.5 MG/3ML) 0.083% nebulizer solution Take 3 mLs (2.5 mg total) by nebulization every 4 (four) hours as needed for wheezing.  25 vial  2  .  ALPRAZolam (XANAX) 1 MG tablet Take 1 tablet (1 mg total) by mouth 3 (three) times daily as needed for anxiety.  90 tablet  3  . CALCIUM-MAGNESIUM-ZINC PO Take 1 capsule by mouth daily.        . Cyanocobalamin (VITAMIN B 12 PO) Take 1 capsule by mouth daily.        . cyclobenzaprine (FLEXERIL) 10 MG tablet Take 1 tablet (10 mg total) by mouth 3 (three) times daily as needed for muscle spasms.  30 tablet  0  . HYDROcodone-acetaminophen (VICODIN) 5-500 MG per tablet TAKE ONE TABLET BY MOUTH TWICE DAILY AS NEEDED FOR PAIN. NO EARLY REFILLS.  40 tablet  3  . lisinopril-hydrochlorothiazide (PRINZIDE,ZESTORETIC) 20-25 MG per tablet TAKE ONE TABLET BY MOUTH DAILY.  90 tablet  1  . Multiple Vitamins-Minerals (CENTRUM SILVER PO) Take 1 capsule by mouth daily.        . Omega-3 Fatty Acids (FISH OIL) 1200 MG CAPS Take 1 capsule by mouth daily.        . predniSONE (STERAPRED UNI-PAK) 10 MG tablet Take 1 tablet (10 mg total) by mouth as directed. As directed x 12 days  42 tablet  0  . promethazine (PHENERGAN) 12.5 MG tablet 1-2 pills every 4 hr as needed for nausea.  50 tablet  1   No current facility-administered medications on file prior to visit.    Allergies  Allergen Reactions  . Codeine Itching  . Compazine     Stroke like symptoms  . Haloperidol Decanoate Other (See Comments)    Extrapyramidal syndrome  . Penicillins Nausea And Vomiting    Family History  Problem Relation Age of Onset  . Arthritis      family history  . Hypertension      grandparent  . Colon cancer Maternal Grandfather     BP 128/74  Pulse 119  Temp(Src) 98.2 F (36.8 C) (Oral)  Ht 5\' 6"  (1.676 m)  Wt 202 lb (91.627 kg)  BMI 32.60 kg/m2  SpO2 98%    Review of Systems Denies loc and visual loss.      Objective:   Physical Exam VITAL SIGNS:  See vs page GENERAL: no distress head: no deformity eyes: no periorbital swelling, no proptosis external nose and ears are normal mouth: no lesion seen Both eac's  and tm's are normal        Assessment & Plan:  Headache, recurrent, therapy limited by noncompliance.  i'll do the best i can.  Phenergan 25 mg IM, and toradol 60 mg IM.

## 2011-11-24 ENCOUNTER — Emergency Department (HOSPITAL_COMMUNITY)
Admission: EM | Admit: 2011-11-24 | Discharge: 2011-11-24 | Disposition: A | Discharge status: Home / Self Care | Payer: Self-pay | Attending: Emergency Medicine | Admitting: Emergency Medicine

## 2011-11-24 ENCOUNTER — Encounter (HOSPITAL_COMMUNITY): Payer: Self-pay | Admitting: *Deleted

## 2011-11-24 DIAGNOSIS — F411 Generalized anxiety disorder: Secondary | ICD-10-CM | POA: Insufficient documentation

## 2011-11-24 DIAGNOSIS — F419 Anxiety disorder, unspecified: Secondary | ICD-10-CM

## 2011-11-24 DIAGNOSIS — G43909 Migraine, unspecified, not intractable, without status migrainosus: Secondary | ICD-10-CM | POA: Insufficient documentation

## 2011-11-24 HISTORY — DX: Panic disorder (episodic paroxysmal anxiety): F41.0

## 2011-11-24 MED ORDER — METOCLOPRAMIDE HCL 5 MG/ML IJ SOLN
10.0000 mg | Freq: Once | INTRAMUSCULAR | Status: AC
  Administered 2011-11-24: 10 mg via INTRAVENOUS
  Filled 2011-11-24: qty 2

## 2011-11-24 MED ORDER — ALPRAZOLAM 1 MG PO TABS: 1.0000 mg | ORAL_TABLET | Freq: Three times a day (TID) | ORAL | Status: DC | PRN

## 2011-11-24 MED ORDER — ALPRAZOLAM 0.5 MG PO TABS
1.0000 mg | ORAL_TABLET | Freq: Once | ORAL | Status: AC
  Administered 2011-11-24: 1 mg via ORAL
  Filled 2011-11-24: qty 2

## 2011-11-24 MED ORDER — KETOROLAC TROMETHAMINE 30 MG/ML IJ SOLN
30.0000 mg | Freq: Once | INTRAMUSCULAR | Status: AC
  Administered 2011-11-24: 30 mg via INTRAVENOUS
  Filled 2011-11-24: qty 1

## 2011-11-24 MED ORDER — DIPHENHYDRAMINE HCL 50 MG/ML IJ SOLN
25.0000 mg | Freq: Once | INTRAMUSCULAR | Status: AC
  Administered 2011-11-24: 25 mg via INTRAVENOUS
  Filled 2011-11-24: qty 1

## 2011-11-24 NOTE — ED Provider Notes (Signed)
History     CSN: [id number]  Arrival date & time 11/24/11  1047   First MD Initiated Contact with Patient 11/24/11 1128      Chief Complaint  Patient presents with  . Migraine  . Panic Attack    (Consider location/radiation/quality/duration/timing/severity/associated sxs/prior treatment) Patient is a 61 y.o. female presenting with migraine. The history is provided by the patient.  Migraine This is a new problem. The current episode started today. The problem occurs constantly. The problem has been unchanged. Associated symptoms include headaches and nausea. Pertinent negatives include no abdominal pain, chest pain, congestion, fever or neck pain. Associated symptoms comments: Right sided frontal headache typical of her migraine headache history. She has photophobia, nausea. She denies fever. She also reports panic attacks as she is currently out of her Xanax and cannot get refill until the first of the month..    Past Medical History  Diagnosis Date  . Depressive disorder, not elsewhere classified   . Other and unspecified hyperlipidemia   . Unspecified essential hypertension   . Migraine, unspecified, without mention of intractable migraine without mention of status migrainosus   . Unspecified urinary incontinence   . Arthritis   . Chronic bronchitis   . Anxiety state, unspecified   . Postmenopausal symptoms   . Panic attacks     Past Surgical History  Procedure Date  . Abdominal hysterectomy 2002  . Dislocated shoulder     right  . Diagnostic laparoscopy   . Tumor right ankle     Family History  Problem Relation Age of Onset  . Arthritis      family history  . Hypertension      grandparent  . Colon cancer Maternal Grandfather     History  Substance Use Topics  . Smoking status: Never Smoker   . Smokeless tobacco: Never Used  . Alcohol Use: No    OB History    Grav Para Term Preterm Abortions TAB SAB Ect Mult Living   1 1 1       1       Review of  Systems  Constitutional: Negative for fever.  HENT: Negative for congestion, neck pain and sinus pressure.   Eyes: Positive for photophobia.  Respiratory: Negative for shortness of breath.   Cardiovascular: Negative for chest pain.  Gastrointestinal: Positive for nausea. Negative for abdominal pain.  Neurological: Positive for headaches.  Psychiatric/Behavioral: The patient is nervous/anxious.     Allergies  Codeine; Compazine; Haloperidol decanoate; and Penicillins  Home Medications   Current Outpatient Rx  Name Route Sig Dispense Refill  . ALPRAZOLAM 1 MG PO TABS Oral Take 1 tablet (1 mg total) by mouth 3 (three) times daily as needed for anxiety. 90 tablet 3  . CALCIUM-MAGNESIUM-ZINC PO Oral Take 1 capsule by mouth daily.     Marland Kitchen VITAMIN B 12 PO Oral Take 1 capsule by mouth daily.      . CYCLOBENZAPRINE HCL 10 MG PO TABS Oral Take 1 tablet (10 mg total) by mouth 3 (three) times daily as needed for muscle spasms. 30 tablet 0  . HYDROCODONE-ACETAMINOPHEN 5-500 MG PO TABS  TAKE ONE TABLET BY MOUTH TWICE DAILY AS NEEDED FOR PAIN. NO EARLY REFILLS. 40 tablet 3  . LISINOPRIL-HYDROCHLOROTHIAZIDE 20-25 MG PO TABS  TAKE ONE TABLET BY MOUTH DAILY. 90 tablet 1  . CENTRUM SILVER PO Oral Take 1 capsule by mouth daily.      Marland Kitchen FISH OIL 1200 MG PO CAPS Oral Take 1 capsule by  mouth daily.      Marland Kitchen PREDNISONE (PAK) 10 MG PO TABS Oral Take 1 tablet (10 mg total) by mouth as directed. As directed x 12 days 42 tablet 0  . PROMETHAZINE HCL 12.5 MG PO TABS  1-2 pills every 4 hr as needed for nausea. 50 tablet 1    BP 111/78  Pulse 115  Temp(Src) 98 F (36.7 C) (Oral)  Resp 21  SpO2 97%  Physical Exam  Constitutional: She is oriented to person, place, and time. She appears well-developed and well-nourished.  HENT:  Head: Normocephalic and atraumatic.  Mouth/Throat: Oropharynx is clear and moist.  Neck: Normal range of motion.  Cardiovascular: Regular rhythm.  Tachycardia present.     Pulmonary/Chest: Effort normal. She has no wheezes. She has no rales.  Abdominal: Soft. There is no tenderness.  Neurological: She is alert and oriented to person, place, and time. She displays normal reflexes. She exhibits normal muscle tone. Coordination normal.       Observed to be ambulatory with steady gait. Cerebellar function without deficit.    ED Course  Procedures (including critical care time)  Labs Reviewed - No data to display No results found.   No diagnosis found. 1. Migraine 2. Anxiety    MDM  Patient feeling improved. Requests discharge home. Will follow up with her doctor next week for Xanax prescription, will give small number of this medication until she can be seen.        Rodena Medin, PA-C 11/24/11 1334

## 2011-11-24 NOTE — ED Provider Notes (Signed)
Patient with fibromyalgia and now with comlaint of right side chest pain with source found.  Diffuse muscular tenderness palpated.  Patient advised regarding pain management.   Hilario Quarry, MD 11/24/11 [id number]

## 2011-11-24 NOTE — Discharge Instructions (Signed)
FOLLOW UP WITH DR. Felicity Coyer NEXT WEEK FOR RECHECK AND FURTHER MANAGEMENT OF ANXIETY AND MIGRAINE.  Anxiety and Panic Attacks Your caregiver has informed you that you are having an anxiety or panic attack. There may be many forms of this. Most of the time these attacks come suddenly and without warning. They come at any time of day, including periods of sleep, and at any time of life. They may be strong and unexplained. Although panic attacks are very scary, they are physically harmless. Sometimes the cause of your anxiety is not known. Anxiety is a protective mechanism of the body in its fight or flight mechanism. Most of these perceived danger situations are actually nonphysical situations (such as anxiety over losing a job). CAUSES  The causes of an anxiety or panic attack are many. Panic attacks may occur in otherwise healthy people given a certain set of circumstances. There may be a genetic cause for panic attacks. Some medications may also have anxiety as a side effect. SYMPTOMS  Some of the most common feelings are:  Intense terror.   Dizziness, feeling faint.   Hot and cold flashes.   Fear of going crazy.   Feelings that nothing is real.   Sweating.   Shaking.   Chest pain or a fast heartbeat (palpitations).   Smothering, choking sensations.   Feelings of impending doom and that death is near.   Tingling of extremities, this may be from over-breathing.   Altered reality (derealization).   Being detached from yourself (depersonalization).  Several symptoms can be present to make up anxiety or panic attacks. DIAGNOSIS  The evaluation by your caregiver will depend on the type of symptoms you are experiencing. The diagnosis of anxiety or panic attack is made when no physical illness can be determined to be a cause of the symptoms. TREATMENT  Treatment to prevent anxiety and panic attacks may include:  Avoidance of circumstances that cause anxiety.   Reassurance and  relaxation.   Regular exercise.   Relaxation therapies, such as yoga.   Psychotherapy with a psychiatrist or therapist.   Avoidance of caffeine, alcohol and illegal drugs.   Prescribed medication.  SEEK IMMEDIATE MEDICAL CARE IF:   You experience panic attack symptoms that are different than your usual symptoms.   You have any worsening or concerning symptoms.  Document Released: 06/17/2005 Document Revised: 06/06/2011 Document Reviewed: 10/19/2009 [facility] Patient Information 2012 [place], [place].

## 2011-11-24 NOTE — ED Notes (Addendum)
Pt from home with reports of migraine (pain on the right side of head) and panic attack with hx of both and reports that symptoms are the same. Pt also endorses photophobia, nausea and vomiting, also reports that symptoms started on Friday and ran out of meds for migraine and anxiety.

## 2011-11-26 ENCOUNTER — Telehealth: Payer: Self-pay | Admitting: Endocrinology

## 2011-11-26 NOTE — Telephone Encounter (Signed)
Informed pt of appt with Dr Modesto Charon on 01/27/12 @ 930am pt need to pay $184.00 up front. Pt declined appt said she could not pay up front fee.

## 2011-12-17 ENCOUNTER — Ambulatory Visit: Payer: Self-pay | Admitting: Neurology

## 2012-01-13 ENCOUNTER — Ambulatory Visit (INDEPENDENT_AMBULATORY_CARE_PROVIDER_SITE_OTHER): Payer: Self-pay | Admitting: Internal Medicine

## 2012-01-13 ENCOUNTER — Encounter: Payer: Self-pay | Admitting: Internal Medicine

## 2012-01-13 ENCOUNTER — Telehealth: Payer: Self-pay

## 2012-01-13 VITALS — BP 124/84 | HR 106 | Temp 98.3°F | Ht 66.0 in | Wt 202.1 lb

## 2012-01-13 DIAGNOSIS — G43909 Migraine, unspecified, not intractable, without status migrainosus: Secondary | ICD-10-CM

## 2012-01-13 DIAGNOSIS — J45901 Unspecified asthma with (acute) exacerbation: Secondary | ICD-10-CM

## 2012-01-13 MED ORDER — BUDESONIDE 0.5 MG/2ML IN SUSP: 0.5000 mg | Freq: Two times a day (BID) | RESPIRATORY_TRACT | Status: DC

## 2012-01-13 MED ORDER — PREDNISONE (PAK) 10 MG PO TABS: 10.0000 mg | ORAL_TABLET | ORAL | Status: DC

## 2012-01-13 MED ORDER — ALBUTEROL SULFATE (2.5 MG/3ML) 0.083% IN NEBU: 2.5000 mg | INHALATION_SOLUTION | RESPIRATORY_TRACT | Status: DC | PRN

## 2012-01-13 MED ORDER — AZITHROMYCIN 250 MG PO TABS: ORAL_TABLET | ORAL | Status: DC

## 2012-01-13 MED ORDER — METHYLPREDNISOLONE ACETATE 80 MG/ML IJ SUSP
120.0000 mg | Freq: Once | INTRAMUSCULAR | Status: AC
  Administered 2012-01-13: 120 mg via INTRAMUSCULAR

## 2012-01-13 MED ORDER — HYDROCODONE-ACETAMINOPHEN 5-500 MG PO TABS: 1.0000 | ORAL_TABLET | Freq: Three times a day (TID) | ORAL | Status: DC | PRN

## 2012-01-13 NOTE — Patient Instructions (Signed)
It was good to see you today. Albuterol treatment and Medrol shot given to you today Pred pak, Zpak antibiotics -  Add pulmicort to nebs 2x/day EVERYDAY - use this in addition to Albuterol as needed for wheeze/spells Refill on hydrocodone Your prescription(s) have been submitted to your pharmacy. Please take as directed and contact our office if you believe you are having problem(s) with the medication(s).

## 2012-01-13 NOTE — Telephone Encounter (Signed)
Pt called stating that Pulmocort nebulizer solution is $400+. Pt is requesting alternative.

## 2012-01-13 NOTE — Progress Notes (Signed)
  Subjective:    Patient ID: Gloria [name], female    DOB: 08-27-1950, 61 y.o.   MRN: [id number]  Asthma She complains of chest tightness, cough, hoarse voice, shortness of breath, sputum production and wheezing. There is no frequent throat clearing or hemoptysis. This is a recurrent problem. The current episode started in the past 7 days. The problem occurs 2 to 4 times per day. The problem has been gradually worsening. The cough is productive of sputum, dry, hacking and paroxysmal. Associated symptoms include headaches (chronic migraines w/o change), malaise/fatigue and a sore throat. Pertinent negatives include no appetite change, chest pain, ear pain, fever, heartburn or nasal congestion. Her symptoms are aggravated by exercise and minimal activity. Her symptoms are alleviated by rest and beta-agonist. She reports moderate improvement on treatment. Her past medical history is significant for asthma.     Past Medical History  Diagnosis Date  . Depressive disorder, not elsewhere classified   . Other and unspecified hyperlipidemia   . Unspecified essential hypertension   . Migraine, unspecified, without mention of intractable migraine without mention of status migrainosus   . Unspecified urinary incontinence   . Arthritis   . Chronic bronchitis   . Anxiety state, unspecified   . Postmenopausal symptoms   . Panic attacks      Review of Systems  Constitutional: Positive for malaise/fatigue. Negative for fever, appetite change, fatigue and unexpected weight change.  HENT: Positive for sore throat and hoarse voice. Negative for ear pain.   Respiratory: Positive for cough, sputum production, shortness of breath and wheezing. Negative for hemoptysis.   Cardiovascular: Negative for chest pain.  Gastrointestinal: Negative for heartburn.  Neurological: Positive for headaches (chronic migraines w/o change). Negative for seizures, syncope, speech difficulty and weakness.       Objective:   Physical Exam  BP 124/84  Pulse 106  Temp 98.3 F (36.8 C) (Oral)  Ht 5\' 6"  (1.676 m)  Wt 202 lb 1.9 oz (91.681 kg)  BMI 32.62 kg/m2  SpO2 97% Gen - NAD Lung: L>R rhonci with exp wheeze and psuedowheeze, no crackles - no increase WOB with conversation CV: RRR       Assessment & Plan:   Acute asthma flare - IM medrol and Alb neb today Reports neb machine at home - provide pulmicort bid and continue Alb q4h prn Empiric Zpak + pred taper x 6 days Education provided on avoiding flares and med MOA  Also refill on chronic hydrocodone today for chronic migraines

## 2012-01-14 ENCOUNTER — Other Ambulatory Visit: Payer: Self-pay | Admitting: Internal Medicine

## 2012-01-14 MED ORDER — BECLOMETHASONE DIPROPIONATE 40 MCG/ACT IN AERS: 2.0000 | INHALATION_SPRAY | Freq: Two times a day (BID) | RESPIRATORY_TRACT | Status: DC

## 2012-01-14 NOTE — Telephone Encounter (Signed)
Called pharmacy spoke with joe/pharmacist rx was faxed over yesterday for hydrocodone. Pharmacist states they didn't received fax. Gave verbal authorization.... 01/14/12@1 :17pm/LMB

## 2012-01-14 NOTE — Telephone Encounter (Signed)
Pt advised of medication change. 

## 2012-01-14 NOTE — Telephone Encounter (Signed)
Use Qvar inhaler BID in place of nebulizer tx - new erx done

## 2012-01-27 ENCOUNTER — Ambulatory Visit: Payer: Self-pay | Admitting: Neurology

## 2012-03-10 ENCOUNTER — Ambulatory Visit: Payer: Self-pay | Admitting: Endocrinology

## 2012-03-10 ENCOUNTER — Telehealth: Payer: Self-pay | Admitting: Internal Medicine

## 2012-03-10 NOTE — Telephone Encounter (Signed)
Caller: [place]/Patient; Patient Name: Gloria [name]; PCP: Rene Paci (Adults only); Best Callback Phone Number: [phone]; Reason for call: Other THE PATIENT REFUSED 911 Calling regarding her asthma is bothering her really bad, having difficulty breathing, out of her inhalers, using Albuterol  nebulizer and last used 03/09/12 2100. Onset of symptoms 03/07/12 and getting worse. States she usually needs steroid shot and antibiotics. RN can tell patient is in distress and needs immediate attention, has appointment scheduled for 3:15 pm with Dr. Everardo All, patient states she cannot wait until then. Emergent signs and symptoms ruled out as per Asthma-Adult protocol except for see in 4 hours due to having asthma symptoms that are worsening. Spoke with Morrie Sheldon at office and states Dr. Felicity Coyer is leaving early and has no appointments. Spoke with Harriett Sine in scheduling and states all appointments are booked. RN advised to proceed to ED at this time and she states that she may can wait for appointment if she "sits still" until then. Also checked with Sandford Craze NP at [facility] and next available appointment at 1600 via EPIC.

## 2012-03-10 NOTE — Telephone Encounter (Signed)
Ok for [employer] at [facility], or i will see tomorrow if still refusing ER

## 2012-03-11 ENCOUNTER — Encounter: Payer: Self-pay | Admitting: Internal Medicine

## 2012-03-11 ENCOUNTER — Ambulatory Visit (INDEPENDENT_AMBULATORY_CARE_PROVIDER_SITE_OTHER): Payer: Self-pay | Admitting: Internal Medicine

## 2012-03-11 VITALS — BP 142/90 | HR 96 | Temp 97.5°F | Resp 18 | Wt 194.5 lb

## 2012-03-11 DIAGNOSIS — G43909 Migraine, unspecified, not intractable, without status migrainosus: Secondary | ICD-10-CM

## 2012-03-11 DIAGNOSIS — I10 Essential (primary) hypertension: Secondary | ICD-10-CM

## 2012-03-11 DIAGNOSIS — J45901 Unspecified asthma with (acute) exacerbation: Secondary | ICD-10-CM

## 2012-03-11 MED ORDER — METHYLPREDNISOLONE ACETATE 80 MG/ML IJ SUSP
120.0000 mg | Freq: Once | INTRAMUSCULAR | Status: AC
  Administered 2012-03-11: 120 mg via INTRAMUSCULAR

## 2012-03-11 MED ORDER — AZITHROMYCIN 250 MG PO TABS: ORAL_TABLET | ORAL | Status: DC

## 2012-03-11 MED ORDER — PREDNISONE (PAK) 10 MG PO TABS: 10.0000 mg | ORAL_TABLET | ORAL | Status: AC

## 2012-03-11 MED ORDER — LORATADINE 10 MG PO TABS: 10.0000 mg | ORAL_TABLET | Freq: Every day | ORAL | Status: DC

## 2012-03-11 MED ORDER — PROMETHAZINE HCL 50 MG/ML IJ SOLN
50.0000 mg | Freq: Once | INTRAMUSCULAR | Status: AC
  Administered 2012-03-11: 50 mg via INTRAMUSCULAR

## 2012-03-11 MED ORDER — MEPERIDINE HCL 50 MG/ML IJ SOLN
50.0000 mg | Freq: Once | INTRAMUSCULAR | Status: AC
  Administered 2012-03-11: 50 mg via INTRAMUSCULAR

## 2012-03-11 NOTE — Assessment & Plan Note (Signed)
BP Readings from Last 3 Encounters:  03/11/12 142/90  01/13/12 124/84  11/24/11 132/89   The current medical regimen is reasonably effective when taken;  continue present plan and medications.  Encouraged compliance today

## 2012-03-11 NOTE — Patient Instructions (Signed)
It was good to see you today. Demerol/phenergan shot and Medrol shot given to you today Pred pak, Zpak antibiotics - also claritin daily for allergy/sinus and itch symptoms  Use Qvar 2x/day EVERYDAY - use this in addition to Albuterol as needed for wheeze/spells Your prescription(s) have been submitted to your pharmacy. Please take as directed and contact our office if you believe you are having problem(s) with the medication(s). Other Medications reviewed, no changes at this time.

## 2012-03-11 NOTE — Progress Notes (Signed)
  Subjective:    Patient ID: Gloria [name], female    DOB: 08/02/1950, 61 y.o.   MRN: [id number]  Asthma She complains of chest tightness, cough, hoarse voice, shortness of breath, sputum production and wheezing. There is no frequent throat clearing or hemoptysis. This is a recurrent problem. The current episode started in the past 7 days. The problem occurs 2 to 4 times per day. The problem has been gradually worsening. The cough is productive of sputum, dry, hacking and paroxysmal. Associated symptoms include headaches (chronic migraines - increase in past 3 days), malaise/fatigue, sneezing and a sore throat. Pertinent negatives include no appetite change, chest pain, ear pain, fever, heartburn or nasal congestion. Her symptoms are aggravated by exercise and minimal activity. Her symptoms are alleviated by rest and beta-agonist. She reports moderate improvement on treatment. Her past medical history is significant for asthma.     Past Medical History  Diagnosis Date  . Depressive disorder, not elsewhere classified   . Other and unspecified hyperlipidemia   . Unspecified essential hypertension   . Migraine, unspecified, without mention of intractable migraine without mention of status migrainosus   . Unspecified urinary incontinence   . Arthritis   . Chronic bronchitis   . Anxiety state, unspecified   . Postmenopausal symptoms   . Panic attacks      Review of Systems  Constitutional: Positive for malaise/fatigue. Negative for fever, appetite change, fatigue and unexpected weight change.  HENT: Positive for sore throat, hoarse voice and sneezing. Negative for ear pain.   Respiratory: Positive for cough, sputum production, shortness of breath and wheezing. Negative for hemoptysis.   Cardiovascular: Negative for chest pain.  Gastrointestinal: Negative for heartburn.  Neurological: Positive for headaches (chronic migraines - increase in past 3 days). Negative for seizures, syncope, facial  asymmetry, speech difficulty, weakness and numbness.       Objective:   Physical Exam  BP 142/90  Pulse 96  Temp 97.5 F (36.4 C) (Oral)  Resp 18  Wt 194 lb 8 oz (88.225 kg)  SpO2 98% Gen - NAD Lung: L>R rhonci with exp wheeze and psuedowheeze, no crackles - no increase WOB with conversation CV: RRR Neuro: Awake alert and oriented x4, cranial nerves II through XII symmetrically intact. Speech fluent without aphasia. Coordination, balance and gait normal  Lab Results  Component Value Date   WBC 14.3* 12/05/2010   HGB 12.1 12/05/2010   HCT 36.2 12/05/2010   PLT 389.0 12/05/2010   GLUCOSE 73 12/05/2010   CHOL 176 12/05/2010   TRIG 274.0* 12/05/2010   HDL 48.00 12/05/2010   LDLDIRECT 96.3 12/05/2010   ALT 18 12/05/2010   AST 17 12/05/2010   NA 138 12/05/2010   K 4.2 12/05/2010   CL 99 12/05/2010   CREATININE 0.7 12/05/2010   BUN 17 12/05/2010   CO2 29 12/05/2010   TSH 2.149 02/28/2011       Assessment & Plan:   Acute asthma flare - IM 120 medrol today neb machine at home - continue Alb q4h prn Qvar sample MDI today (pulmicort and Qvar $ prohib) Empiric Zpak + pred taper x 6 days Also add OTC  Education provided on avoiding flares and med MOA  Acute on chronic migraines - neuro exam benign today Continue chronic hydrocodone, no refills given today IM 50/50 Demerol/Phenergan given in office today

## 2012-03-12 ENCOUNTER — Ambulatory Visit (INDEPENDENT_AMBULATORY_CARE_PROVIDER_SITE_OTHER): Payer: Self-pay | Admitting: Internal Medicine

## 2012-03-12 ENCOUNTER — Encounter: Payer: Self-pay | Admitting: Internal Medicine

## 2012-03-12 VITALS — BP 134/90 | HR 88 | Temp 97.9°F | Resp 16 | Wt 195.2 lb

## 2012-03-12 DIAGNOSIS — G43909 Migraine, unspecified, not intractable, without status migrainosus: Secondary | ICD-10-CM

## 2012-03-12 DIAGNOSIS — F411 Generalized anxiety disorder: Secondary | ICD-10-CM

## 2012-03-12 DIAGNOSIS — G43109 Migraine with aura, not intractable, without status migrainosus: Secondary | ICD-10-CM

## 2012-03-12 MED ORDER — PROMETHAZINE HCL 50 MG/ML IJ SOLN
50.0000 mg | Freq: Once | INTRAMUSCULAR | Status: AC
  Administered 2012-03-12: 50 mg via INTRAMUSCULAR

## 2012-03-12 MED ORDER — MEPERIDINE HCL 25 MG/ML IJ SOLN
100.0000 mg | Freq: Once | INTRAMUSCULAR | Status: AC
  Administered 2012-03-12: 100 mg via INTRAMUSCULAR

## 2012-03-12 MED ORDER — CITALOPRAM HYDROBROMIDE 10 MG PO TABS: 10.0000 mg | ORAL_TABLET | Freq: Every day | ORAL | Status: DC

## 2012-03-12 NOTE — Patient Instructions (Signed)
It was good to see you today. Demerol/phenergan shot given to you today Start citalopram for anxiety symptoms in addition to current dose xanax (use citalopram every day and xanax ONLY when/if needed) Your prescription(s) have been submitted to your pharmacy. Please take as directed and contact our office if you believe you are having problem(s) with the medication(s). Other Medications reviewed, no additional changes at this time.

## 2012-03-12 NOTE — Progress Notes (Signed)
  Subjective:    Patient ID: Gloria [name], female    DOB: 13-Dec-1950, 61 y.o.   MRN: [id number]  Headache  This is a chronic problem. The current episode started in the past 7 days. The problem occurs constantly. The problem has been unchanged. The pain is located in the bilateral, frontal, occipital and temporal region. The pain does not radiate. The pain quality is similar to prior headaches. The quality of the pain is described as band-like and aching. The pain is moderate. Associated symptoms include nausea, phonophobia and photophobia. Pertinent negatives include no abnormal behavior, anorexia, ear pain, eye redness, fever, numbness, scalp tenderness, seizures, sinus pressure, tingling, tinnitus or weakness. The symptoms are aggravated by emotional stress and fatigue. She has tried injectable narcotics, oral narcotics and NSAIDs for the symptoms. The treatment provided mild relief. Her past medical history is significant for migraine headaches. There is no history of cancer or recent head traumas.     Past Medical History  Diagnosis Date  . Depressive disorder, not elsewhere classified   . Other and unspecified hyperlipidemia   . Unspecified essential hypertension   . Migraine, unspecified, without mention of intractable migraine without mention of status migrainosus   . Unspecified urinary incontinence   . Arthritis   . Chronic bronchitis   . Anxiety state, unspecified   . Postmenopausal symptoms   . Panic attacks      Review of Systems  Constitutional: Negative for fever, fatigue and unexpected weight change.  HENT: Negative for ear pain, sinus pressure and tinnitus.   Eyes: Positive for photophobia. Negative for redness.  Gastrointestinal: Positive for nausea. Negative for anorexia.  Neurological: Positive for headaches. Negative for tingling, seizures, syncope, facial asymmetry, speech difficulty, weakness and numbness.       Objective:   Physical Exam  BP 134/90  Pulse 88   Temp 97.9 F (36.6 C) (Oral)  Resp 16  Wt 195 lb 4 oz (88.565 kg)  SpO2 97% Gen - NAD Lung: CTA, no wheeze or psuedowheeze, no crackles - no increase WOB with conversation CV: RRR Neuro: Awake alert and oriented x4, cranial nerves II through XII symmetrically intact. Speech fluent without aphasia. Coordination, balance and gait normal Psyc: tearful and anxious  Lab Results  Component Value Date   WBC 14.3* 12/05/2010   HGB 12.1 12/05/2010   HCT 36.2 12/05/2010   PLT 389.0 12/05/2010   GLUCOSE 73 12/05/2010   CHOL 176 12/05/2010   TRIG 274.0* 12/05/2010   HDL 48.00 12/05/2010   LDLDIRECT 96.3 12/05/2010   ALT 18 12/05/2010   AST 17 12/05/2010   NA 138 12/05/2010   K 4.2 12/05/2010   CL 99 12/05/2010   CREATININE 0.7 12/05/2010   BUN 17 12/05/2010   CO2 29 12/05/2010   TSH 2.149 02/28/2011       Assessment & Plan:    Acute on chronic migraines - neuro exam remains benign today Continue chronic hydrocodone, no refills given today IM 50/50 Demerol/Phenergan given in office yesterday, repeat dose at 100/50 today

## 2012-03-12 NOTE — Assessment & Plan Note (Signed)
Intolerant of buspar - crisis with son getting DUI previously intolerant of paxil and sertraline trials - ?adequate duration as pt reluctant to take same Start generic citalopram now and titrate up as tolerated i declined to increase dose or change BZ - explained risk of tolerance and evidence of same now Verified no SI/HI today

## 2012-03-20 ENCOUNTER — Telehealth: Payer: Self-pay

## 2012-03-20 MED ORDER — AZITHROMYCIN 250 MG PO TABS: ORAL_TABLET | ORAL | Status: DC

## 2012-03-20 NOTE — Telephone Encounter (Signed)
Called the patient left detailed message that rx requested has been sent in.

## 2012-03-20 NOTE — Telephone Encounter (Signed)
Done erx 

## 2012-03-20 NOTE — Telephone Encounter (Signed)
Pt called stating she is still experiencing some mild coughing and chills since completing course of ABX. Pt is requesting to repeat ABX, please advise in VAL's absence, thanks!

## 2012-03-24 ENCOUNTER — Other Ambulatory Visit: Payer: Self-pay | Admitting: Internal Medicine

## 2012-03-24 NOTE — Telephone Encounter (Signed)
Pt last seen on 9/12 and Rx last filled on 11/21/11.

## 2012-04-15 ENCOUNTER — Ambulatory Visit (INDEPENDENT_AMBULATORY_CARE_PROVIDER_SITE_OTHER): Payer: Self-pay | Admitting: Internal Medicine

## 2012-04-15 ENCOUNTER — Ambulatory Visit (INDEPENDENT_AMBULATORY_CARE_PROVIDER_SITE_OTHER)
Admission: RE | Admit: 2012-04-15 | Discharge: 2012-04-15 | Disposition: A | Discharge status: Home / Self Care | Payer: Self-pay | Source: Ambulatory Visit | Attending: Internal Medicine | Admitting: Internal Medicine

## 2012-04-15 ENCOUNTER — Encounter: Payer: Self-pay | Admitting: Internal Medicine

## 2012-04-15 VITALS — BP 122/80 | HR 95 | Temp 98.5°F | Resp 16 | Ht 67.0 in | Wt 195.2 lb

## 2012-04-15 DIAGNOSIS — R05 Cough: Secondary | ICD-10-CM

## 2012-04-15 DIAGNOSIS — J45901 Unspecified asthma with (acute) exacerbation: Secondary | ICD-10-CM

## 2012-04-15 DIAGNOSIS — I10 Essential (primary) hypertension: Secondary | ICD-10-CM

## 2012-04-15 DIAGNOSIS — R059 Cough, unspecified: Secondary | ICD-10-CM

## 2012-04-15 DIAGNOSIS — G43909 Migraine, unspecified, not intractable, without status migrainosus: Secondary | ICD-10-CM

## 2012-04-15 MED ORDER — METHYLPREDNISOLONE ACETATE 80 MG/ML IJ SUSP
120.0000 mg | Freq: Once | INTRAMUSCULAR | Status: AC
  Administered 2012-04-15: 120 mg via INTRAMUSCULAR

## 2012-04-15 MED ORDER — MOMETASONE FURO-FORMOTEROL FUM 200-5 MCG/ACT IN AERO: 2.0000 | INHALATION_SPRAY | Freq: Two times a day (BID) | RESPIRATORY_TRACT | Status: DC

## 2012-04-15 MED ORDER — MEPERIDINE HCL 50 MG/ML IJ SOLN
50.0000 mg | Freq: Once | INTRAMUSCULAR | Status: AC
  Administered 2012-04-15: 50 mg via INTRAVENOUS

## 2012-04-15 MED ORDER — PROMETHAZINE HCL 25 MG/ML IJ SOLN
25.0000 mg | Freq: Once | INTRAMUSCULAR | Status: AC
  Administered 2012-04-15: 25 mg via INTRAVENOUS

## 2012-04-15 NOTE — Patient Instructions (Signed)

## 2012-04-20 ENCOUNTER — Other Ambulatory Visit: Payer: Self-pay | Admitting: Internal Medicine

## 2012-04-20 NOTE — Assessment & Plan Note (Signed)
Stop the ACEI, check a CXR, treat the asthma, there is no evidence of infection

## 2012-04-20 NOTE — Progress Notes (Signed)
Subjective:    Patient ID: Gloria [name], female    DOB: 01/21/1951, 61 y.o.   MRN: [id number]  Migraine  This is a recurrent problem. The current episode started in the past 7 days. The problem occurs constantly. The problem has been unchanged. The pain is located in the bilateral region. The pain does not radiate. The pain quality is similar to prior headaches. The quality of the pain is described as throbbing. The pain is at a severity of 4/10. The pain is mild. Associated symptoms include coughing. Pertinent negatives include no abdominal pain, abnormal behavior, anorexia, back pain, blurred vision, dizziness, drainage, ear pain, eye pain, eye redness, facial sweating, fever, hearing loss, insomnia, loss of balance, muscle aches, nausea, neck pain, numbness, phonophobia, photophobia, rhinorrhea, scalp tenderness, seizures, sinus pressure, sore throat, swollen glands, tingling, tinnitus, visual change, vomiting, weakness or weight loss. Nothing aggravates the symptoms. She has tried acetaminophen and oral narcotics for the symptoms. The treatment provided mild relief. Her past medical history is significant for hypertension, migraine headaches and migraines in the family. There is no history of cancer, cluster headaches, immunosuppression, obesity, pseudotumor cerebri, recent head traumas, sinus disease or TMJ.  Hypertension This is a chronic problem. The current episode started more than 1 year ago. The problem has been gradually improving since onset. The problem is controlled. Associated symptoms include headaches. Pertinent negatives include no anxiety, blurred vision, chest pain, malaise/fatigue, neck pain, orthopnea, palpitations, peripheral edema, PND, shortness of breath or sweats. Past treatments include ACE inhibitors and diuretics. The current treatment provides significant improvement. Compliance problems include exercise.   Asthma She complains of cough and wheezing. There is no hemoptysis or  shortness of breath. Associated symptoms include headaches. Pertinent negatives include no appetite change, chest pain, ear congestion, ear pain, fever, heartburn, malaise/fatigue, myalgias, nasal congestion, PND, postnasal drip, rhinorrhea, sore throat, sweats or weight loss. Her past medical history is significant for asthma.  Cough This is a recurrent problem. The current episode started more than 1 month ago. The problem has been unchanged. The problem occurs every few hours. The cough is non-productive. Associated symptoms include headaches and wheezing. Pertinent negatives include no chest pain, chills, ear congestion, ear pain, eye redness, fever, heartburn, hemoptysis, myalgias, nasal congestion, postnasal drip, rash, rhinorrhea, sore throat, shortness of breath, sweats or weight loss. Her past medical history is significant for asthma.      Review of Systems  Constitutional: Negative for fever, chills, weight loss, malaise/fatigue, diaphoresis, activity change, appetite change, fatigue and unexpected weight change.  HENT: Negative for hearing loss, ear pain, sore throat, rhinorrhea, neck pain, postnasal drip, sinus pressure and tinnitus.   Eyes: Negative.  Negative for blurred vision, photophobia, pain and redness.  Respiratory: Positive for cough and wheezing. Negative for apnea, hemoptysis, choking, chest tightness, shortness of breath and stridor.   Cardiovascular: Negative.  Negative for chest pain, palpitations, orthopnea, leg swelling and PND.  Gastrointestinal: Negative.  Negative for heartburn, nausea, vomiting, abdominal pain and anorexia.  Genitourinary: Negative.   Musculoskeletal: Negative.  Negative for myalgias, back pain, joint swelling, arthralgias and gait problem.  Skin: Negative for color change, pallor, rash and wound.  Neurological: Positive for headaches. Negative for dizziness, tingling, tremors, seizures, syncope, facial asymmetry, speech difficulty, weakness,  light-headedness, numbness and loss of balance.  Hematological: Negative for adenopathy. Does not bruise/bleed easily.  Psychiatric/Behavioral: Negative.  The patient does not have insomnia.        Objective:   Physical Exam  Vitals  reviewed. Constitutional: She is oriented to person, place, and time. She appears well-developed and well-nourished.  Non-toxic appearance. She does not have a sickly appearance. She does not appear ill. No distress.  HENT:  Head: Normocephalic and atraumatic.  Mouth/Throat: Oropharynx is clear and moist. No oropharyngeal exudate.  Eyes: Conjunctivae normal are normal. Right eye exhibits no discharge. Left eye exhibits no discharge. No scleral icterus.  Neck: Normal range of motion. Neck supple. No JVD present. No tracheal deviation present. No thyromegaly present.  Cardiovascular: Normal rate, regular rhythm, normal heart sounds and intact distal pulses.  Exam reveals no gallop and no friction rub.   No murmur heard. Pulmonary/Chest: Effort normal. No accessory muscle usage or stridor. Not tachypneic. No respiratory distress. She has no decreased breath sounds. She has wheezes in the right middle field and the left middle field. She has rhonchi in the right middle field and the left middle field. She has no rales. She exhibits no tenderness.  Abdominal: Soft. Bowel sounds are normal. She exhibits no distension and no mass. There is no tenderness. There is no rebound and no guarding.  Musculoskeletal: Normal range of motion. She exhibits no edema and no tenderness.  Lymphadenopathy:    She has no cervical adenopathy.  Neurological: She is alert and oriented to person, place, and time. She has normal strength. She displays no atrophy, no tremor and normal reflexes. No cranial nerve deficit or sensory deficit. She exhibits normal muscle tone. She displays a negative Romberg sign. She displays no seizure activity. Coordination and gait normal. She displays no  Babinski's sign on the right side. She displays no Babinski's sign on the left side.  Reflex Scores:      Tricep reflexes are 1+ on the right side and 1+ on the left side.      Bicep reflexes are 1+ on the right side and 1+ on the left side.      Brachioradialis reflexes are 1+ on the right side and 1+ on the left side.      Patellar reflexes are 1+ on the right side and 1+ on the left side.      Achilles reflexes are 1+ on the right side and 1+ on the left side. Skin: Skin is warm and dry. No rash noted. She is not diaphoretic. No erythema. No pallor.  Psychiatric: She has a normal mood and affect. Her behavior is normal. Judgment and thought content normal.          Assessment & Plan:

## 2012-04-20 NOTE — Assessment & Plan Note (Signed)
Start dulera, she was given an injection of depo-medrol IM

## 2012-04-20 NOTE — Assessment & Plan Note (Signed)
She was given demerol and phenergan for the HA, her friend drove her home

## 2012-04-20 NOTE — Assessment & Plan Note (Signed)
Due to the cough she will stop the ACEI, her BP will be rechecked in a few weeks and a different med will be started if needed

## 2012-04-21 ENCOUNTER — Telehealth: Payer: Self-pay | Admitting: Internal Medicine

## 2012-04-21 NOTE — Telephone Encounter (Signed)
Caller: Shalini/Patient; Patient Name: Gloria [name]; PCP: Rene Paci (Adults only); Best Callback Phone Number: [phone] Pt states her pharmacy has faxed a refill request for her Hydrocodone but has not gotten a response. She takes this for chronic pain due to Arthritis in her hips and legs and tail bone. She typically takes 1 pill a day and has not had any medicine since last Thurs. She reports last refill should have been around the 19th of September. She rates the pain at a 8. Emergent s/s of Leg non-injury protocol r/o. Pt to see provider within 24hrs. Pt requesting refill.   Pharmacy is  [facility] (consulting). Pt also states she did not get the results of her Chest X-Ray done 04/15/12. Pt informed per EPIC no acute cardiopulmonary abnormalities.

## 2012-04-21 NOTE — Telephone Encounter (Signed)
ok 

## 2012-04-21 NOTE — Telephone Encounter (Signed)
Last written 01/13/2012 #40 with 3 refills-please advise.

## 2012-04-21 NOTE — Telephone Encounter (Signed)
Rx faxed to [facility].  

## 2012-05-04 ENCOUNTER — Other Ambulatory Visit: Payer: Self-pay | Admitting: Internal Medicine

## 2012-05-12 ENCOUNTER — Other Ambulatory Visit (INDEPENDENT_AMBULATORY_CARE_PROVIDER_SITE_OTHER): Payer: Self-pay

## 2012-05-12 ENCOUNTER — Telehealth: Payer: Self-pay | Admitting: Internal Medicine

## 2012-05-12 ENCOUNTER — Ambulatory Visit (INDEPENDENT_AMBULATORY_CARE_PROVIDER_SITE_OTHER): Payer: Self-pay | Admitting: Internal Medicine

## 2012-05-12 ENCOUNTER — Encounter: Payer: Self-pay | Admitting: Internal Medicine

## 2012-05-12 VITALS — BP 118/82 | HR 90 | Temp 98.1°F | Ht 67.0 in | Wt 196.8 lb

## 2012-05-12 DIAGNOSIS — E162 Hypoglycemia, unspecified: Secondary | ICD-10-CM

## 2012-05-12 DIAGNOSIS — G4700 Insomnia, unspecified: Secondary | ICD-10-CM

## 2012-05-12 DIAGNOSIS — M62838 Other muscle spasm: Secondary | ICD-10-CM

## 2012-05-12 LAB — HEMOGLOBIN A1C: Hgb A1c MFr Bld: 5.9 % (ref 4.6–6.5)

## 2012-05-12 MED ORDER — AMITRIPTYLINE HCL 10 MG PO TABS: 10.0000 mg | ORAL_TABLET | Freq: Every day | ORAL | Status: DC

## 2012-05-12 MED ORDER — CYCLOBENZAPRINE HCL 5 MG PO TABS: 5.0000 mg | ORAL_TABLET | Freq: Three times a day (TID) | ORAL | Status: DC | PRN

## 2012-05-12 NOTE — Progress Notes (Signed)
  Subjective:    Patient ID: Gloria [name], female    DOB: [birth year], 61 y.o.   MRN: [id number]  HPI  See CC Also phone note from this AM - lost hydrocodone rx  Past Medical History  Diagnosis Date  . Depressive disorder, not elsewhere classified   . Other and unspecified hyperlipidemia   . Unspecified essential hypertension   . Migraine, unspecified, without mention of intractable migraine without mention of status migrainosus   . Unspecified urinary incontinence   . Arthritis   . Chronic bronchitis   . Anxiety state, unspecified   . Postmenopausal symptoms   . Panic attacks      Review of Systems  Constitutional: Positive for fatigue. Negative for fever.  Neurological: Positive for tremors. Negative for weakness and headaches.       Objective:   Physical Exam BP 118/82  Pulse 90  Temp 98.1 F (36.7 C) (Oral)  Ht 5\' 7"  (1.702 m)  Wt 196 lb 12.8 oz (89.268 kg)  BMI 30.82 kg/m2  SpO2 97% Wt Readings from Last 3 Encounters:  05/12/12 196 lb 12.8 oz (89.268 kg)  04/15/12 195 lb 4 oz (88.565 kg)  03/12/12 195 lb 4 oz (88.565 kg)   Constitutional: She appears well-developed and well-nourished. No distress.  Neck: Normal range of motion. Neck supple. No JVD present. No thyromegaly present.  Cardiovascular: Normal rate, regular rhythm and normal heart sounds.  No murmur heard. No BLE edema. Pulmonary/Chest: Effort normal and breath sounds normal. No respiratory distress. She has no wheezes.  Neurological: She is alert and oriented to person, place, and time. No cranial nerve deficit. Coordination normal.  Skin: Skin is warm and dry. No rash noted. No erythema.  Psychiatric: She has a normal mood and affect. Her behavior is normal. Judgment and thought content normal.   Lab Results  Component Value Date   WBC 14.3* 12/05/2010   HGB 12.1 12/05/2010   HCT 36.2 12/05/2010   PLT 389.0 12/05/2010   GLUCOSE 73 12/05/2010   CHOL 176 12/05/2010   TRIG 274.0* 12/05/2010   HDL 48.00  12/05/2010   LDLDIRECT 96.3 12/05/2010   ALT 18 12/05/2010   AST 17 12/05/2010   NA 138 12/05/2010   K 4.2 12/05/2010   CL 99 12/05/2010   CREATININE 0.7 12/05/2010   BUN 17 12/05/2010   CO2 29 12/05/2010   TSH 2.149 02/28/2011       Assessment & Plan:   symptomatic hypoglycemia - concern for DM per pt due to ?FH  Insomnia - chronic - will rx non controlled substance for tx of same  Muscle spasm - flexeril prn

## 2012-05-12 NOTE — Patient Instructions (Signed)
It was good to see you today. Test(s) ordered today for diabetes mellitus. Your results will be released to MyChart (or called to you) after review, usually within 72hours after test completion. If any changes need to be made, you will be notified at that same time. Use amitriptyline at bedtime for sleep and flexeril for muscle relaxer as needed No early refills on controlled substances, no mater the reason -

## 2012-05-12 NOTE — Telephone Encounter (Signed)
Caller: Gloria [name]/Patient; Phone: [phone]; Reason for Call: Pt picked up all 3 meds yesterday at [facility]; she took one pain pill last night and now cannot find them.  Please call her to advise if this can be called in again.  She has spoken w/pharmacy and they advised her to call office.

## 2012-05-12 NOTE — Telephone Encounter (Signed)
Pt came in for appt this am. Was given md response...Raechel Chute

## 2012-05-12 NOTE — Telephone Encounter (Signed)
No early refills on lost meds per protocol policy -

## 2012-06-07 ENCOUNTER — Other Ambulatory Visit: Payer: Self-pay | Admitting: Internal Medicine

## 2012-06-08 NOTE — Telephone Encounter (Signed)
Faxed script back to [facility].../lmb 

## 2012-06-29 ENCOUNTER — Ambulatory Visit (INDEPENDENT_AMBULATORY_CARE_PROVIDER_SITE_OTHER): Payer: Self-pay | Admitting: Family Medicine

## 2012-06-29 ENCOUNTER — Encounter: Payer: Self-pay | Admitting: Family Medicine

## 2012-06-29 VITALS — BP 150/80 | HR 116 | Temp 98.4°F | Wt 191.0 lb

## 2012-06-29 DIAGNOSIS — I10 Essential (primary) hypertension: Secondary | ICD-10-CM

## 2012-06-29 DIAGNOSIS — J45901 Unspecified asthma with (acute) exacerbation: Secondary | ICD-10-CM

## 2012-06-29 DIAGNOSIS — G43909 Migraine, unspecified, not intractable, without status migrainosus: Secondary | ICD-10-CM

## 2012-06-29 MED ORDER — MEPERIDINE HCL 50 MG/ML IJ SOLN
50.0000 mg | Freq: Once | INTRAMUSCULAR | Status: AC
  Administered 2012-06-29: 50 mg via INTRAMUSCULAR

## 2012-06-29 MED ORDER — ALBUTEROL SULFATE (2.5 MG/3ML) 0.083% IN NEBU: 2.5000 mg | INHALATION_SOLUTION | RESPIRATORY_TRACT | Status: DC | PRN

## 2012-06-29 MED ORDER — ALBUTEROL SULFATE (5 MG/ML) 0.5% IN NEBU: 2.5000 mg | INHALATION_SOLUTION | Freq: Four times a day (QID) | RESPIRATORY_TRACT | Status: DC | PRN

## 2012-06-29 MED ORDER — METHYLPREDNISOLONE ACETATE 80 MG/ML IJ SUSP
120.0000 mg | Freq: Once | INTRAMUSCULAR | Status: AC
  Administered 2012-06-29: 120 mg via INTRAMUSCULAR

## 2012-06-29 MED ORDER — PROMETHAZINE HCL 50 MG/ML IJ SOLN
25.0000 mg | Freq: Once | INTRAMUSCULAR | Status: AC
  Administered 2012-06-29: 25 mg via INTRAMUSCULAR

## 2012-06-29 MED ORDER — IPRATROPIUM BROMIDE 0.02 % IN SOLN
0.5000 mg | Freq: Four times a day (QID) | RESPIRATORY_TRACT | Status: DC | PRN
  Administered 2012-06-29: 0.5 mg via RESPIRATORY_TRACT

## 2012-06-29 NOTE — Patient Instructions (Addendum)
-  albuterol as needed  -follow up with your doctor in 3-5 days or sooner if worsening, if severe symptoms not responding to albuterol go to the emergency room immediately

## 2012-06-29 NOTE — Progress Notes (Signed)
Chief Complaint  Patient presents with  . Asthma  . Migraine  . Cough    HPI:  Asthma: -several flares in last 6 months per [facility] -flare last few days worse - had been going on for a few weeks after cold, but running out of albuterol so needed to be seen -symptoms: SOB, coughing, wheezing - hasn't taken any albuterol today -denies: fever or thick sputum -baseline meds: dulera started 03/2012, albuterol prn -pt reports zpack and IM depo-medrol for flares works for her - given for last flare in 03/2012 per [facility] - reports needs albuterol Rx  Migraine: -chronic, on elavil and celexa -on [facility] gets IM demerol/phenergan 25/50 for intractable episodes with nausea -current HA started 3 days ago, hydrocodone didn't work - wants shots -similar to past headaches with no change  ROS: See pertinent positives and negatives per HPI.  Past Medical History  Diagnosis Date  . Depressive disorder, not elsewhere classified   . Other and unspecified hyperlipidemia   . Unspecified essential hypertension   . Migraine, unspecified, without mention of intractable migraine without mention of status migrainosus   . Unspecified urinary incontinence   . Arthritis   . Chronic bronchitis   . Anxiety state, unspecified   . Postmenopausal symptoms   . Panic attacks     Family History  Problem Relation Age of Onset  . Arthritis      family history  . Hypertension      grandparent  . Colon cancer Maternal Grandfather     History   Social History  . Marital Status: Divorced    Spouse Name: N/A    Number of Children: 1  . Years of Education: N/A   Occupational History  . Self employed-interior design/flower arrangements    Social History Main Topics  . Smoking status: Never Smoker   . Smokeless tobacco: Never Used  . Alcohol Use: No  . Drug Use: No  . Sexually Active: Not Currently   Other Topics Concern  . None   Social History Narrative  . None    Current outpatient  prescriptions:albuterol (PROVENTIL) (2.5 MG/3ML) 0.083% nebulizer solution, Take 3 mLs (2.5 mg total) by nebulization every 4 (four) hours as needed for wheezing., Disp: 25 vial, Rfl: 2;  ALPRAZolam (XANAX) 1 MG tablet, TAKE ONE TABLET BY MOUTH THREE TIMES DAILY AS NEEDED FOR ANXIETY AND  PANIC  SYMPTOMS, Disp: 90 tablet, Rfl: 1 amitriptyline (ELAVIL) 10 MG tablet, Take 1 tablet (10 mg total) by mouth at bedtime., Disp: 30 tablet, Rfl: 3;  CALCIUM-MAGNESIUM-ZINC PO, Take 1 capsule by mouth daily. , Disp: , Rfl: ;  citalopram (CELEXA) 10 MG tablet, Take 1 tablet (10 mg total) by mouth daily., Disp: 30 tablet, Rfl: 5;  Cyanocobalamin (VITAMIN B 12 PO), Take 1 capsule by mouth daily.  , Disp: , Rfl:  cyclobenzaprine (FLEXERIL) 5 MG tablet, Take 1 tablet (5 mg total) by mouth every 8 (eight) hours as needed for muscle spasms., Disp: 30 tablet, Rfl: 1;  HYDROcodone-acetaminophen (VICODIN) 5-500 MG per tablet, TAKE ONE TABLET BY MOUTH EVERY 8 HOURS AS NEEDED FOR PAIN, Disp: 40 tablet, Rfl: 2;  lisinopril-hydrochlorothiazide (PRINZIDE,ZESTORETIC) 20-25 MG per tablet, TAKE ONE TABLET BY MOUTH EVERY DAY, Disp: 90 tablet, Rfl: 1 loratadine (CLARITIN) 10 MG tablet, Take 1 tablet (10 mg total) by mouth daily., Disp: 30 tablet, Rfl: 11;  Mometasone Furo-Formoterol Fum (DULERA) 200-5 MCG/ACT AERO, Inhale 2 puffs into the lungs 2 (two) times daily., Disp: 3 Inhaler, Rfl: 0;  Multiple Vitamins-Minerals (CENTRUM SILVER PO), Take 1 capsule by mouth daily.  , Disp: , Rfl: ;  Omega-3 Fatty Acids (FISH OIL) 1200 MG CAPS, Take 1 capsule by mouth daily.  , Disp: , Rfl:  promethazine (PHENERGAN) 12.5 MG tablet, 1-2 pills every 4 hr as needed for nausea., Disp: 50 tablet, Rfl: 1 Current facility-administered medications:albuterol (PROVENTIL) (5 MG/ML) 0.5% nebulizer solution 2.5 mg, 2.5 mg, Nebulization, Q6H PRN, Terressa Koyanagi, DO;  ipratropium (ATROVENT) nebulizer solution 0.5 mg, 0.5 mg, Nebulization, Q6H PRN, Terressa Koyanagi, DO, 0.5  mg at 06/29/12 1634;  meperidine (DEMEROL) injection 50 mg, 50 mg, Intramuscular, Once, Terressa Koyanagi, DO methylPREDNISolone acetate (DEPO-MEDROL) injection 120 mg, 120 mg, Intramuscular, Once, Terressa Koyanagi, DO;  promethazine (PHENERGAN) injection 25 mg, 25 mg, Intramuscular, Once, Terressa Koyanagi, DO  EXAM:  Filed Vitals:   06/29/12 1555  BP: 150/80  Pulse: 116  Temp: 98.4 F (36.9 C)    There is no height on file to calculate BMI.  GENERAL: vitals reviewed and listed above, alert, oriented, appears well hydrated and in no acute distress  HEENT: atraumatic, conjunttiva clear, no obvious abnormalities on inspection of external nose and ears  NECK: no obvious masses on inspection  LUNGS: clear to auscultation bilaterally, difuse wheezes, rales or rhonchi, good air movement  CV: HRRR, no peripheral edema  MS: moves all extremities without noticeable abnormality  NEURO: PERRLA, CN I-XII grossly intact  PSYCH: pleasant and cooperative, no obvious depression or anxiety  ASSESSMENT AND PLAN:  Discussed the following assessment and plan:  1. MIGRAINE HEADACHE, intractable  -pt reports symptoms same as previous migraines -IM phenergan and demerol has worked in the past to abort her HAs, given today  2. Asthma exacerbation  -normal O2 sats, wheezing on exam -neb tx, IM depo-medrol -refilled albuterol -follow up with PCP in 3-5 days or sooner or go to ED if worsening - may need step up therapy  3. HTN (hypertension)  -pt thinks related to her HA, recheck at follow up with PCP   -Patient advised to return or notify a doctor immediately if symptoms worsen or persist or new concerns arise.  Patient Instructions   -albuterol as needed  -follow up with your doctor in 3-5 days or sooner if worsening, if severe symptoms not responding to albuterol go to the emergency room immediately     Kriste Basque R.

## 2012-07-03 ENCOUNTER — Other Ambulatory Visit: Payer: Self-pay | Admitting: Internal Medicine

## 2012-07-03 NOTE — Telephone Encounter (Signed)
ok 

## 2012-07-06 ENCOUNTER — Other Ambulatory Visit: Payer: Self-pay | Admitting: *Deleted

## 2012-07-06 NOTE — Telephone Encounter (Signed)
Received call from pharmacy have rx for vicodin 5/500. That strength is no longer available. Requesting dosage change...Raechel Chute

## 2012-07-06 NOTE — Telephone Encounter (Signed)
Faxed script back to [facility].../lmb 

## 2012-07-06 NOTE — Telephone Encounter (Signed)
Changed to norco with clarifing instructions added "do not fill more than once every 30d"

## 2012-07-07 MED ORDER — HYDROCODONE-ACETAMINOPHEN 5-325 MG PO TABS: 1.0000 | ORAL_TABLET | Freq: Three times a day (TID) | ORAL | Status: DC | PRN

## 2012-07-07 NOTE — Telephone Encounter (Signed)
Fax back to [facility]...Gloria [name]

## 2012-07-17 ENCOUNTER — Encounter: Payer: Self-pay | Admitting: Internal Medicine

## 2012-07-17 ENCOUNTER — Ambulatory Visit (INDEPENDENT_AMBULATORY_CARE_PROVIDER_SITE_OTHER): Payer: Self-pay | Admitting: Internal Medicine

## 2012-07-17 VITALS — BP 142/80 | HR 104 | Temp 99.0°F | Resp 12 | Wt 201.0 lb

## 2012-07-17 DIAGNOSIS — J45901 Unspecified asthma with (acute) exacerbation: Secondary | ICD-10-CM

## 2012-07-17 DIAGNOSIS — J069 Acute upper respiratory infection, unspecified: Secondary | ICD-10-CM

## 2012-07-17 DIAGNOSIS — G43909 Migraine, unspecified, not intractable, without status migrainosus: Secondary | ICD-10-CM

## 2012-07-17 DIAGNOSIS — J45909 Unspecified asthma, uncomplicated: Secondary | ICD-10-CM

## 2012-07-17 MED ORDER — LEVOFLOXACIN 500 MG PO TABS: 500.0000 mg | ORAL_TABLET | Freq: Every day | ORAL | Status: DC

## 2012-07-17 MED ORDER — METHYLPREDNISOLONE ACETATE 80 MG/ML IJ SUSP
120.0000 mg | Freq: Once | INTRAMUSCULAR | Status: AC
  Administered 2012-07-17: 120 mg via INTRAMUSCULAR

## 2012-07-17 MED ORDER — MEPERIDINE HCL 25 MG/ML IJ SOLN
50.0000 mg | Freq: Once | INTRAMUSCULAR | Status: AC
  Administered 2012-07-17: 50 mg via INTRAMUSCULAR

## 2012-07-17 NOTE — Patient Instructions (Signed)
It was good to see you today. Injections of steroids and pain medication (demerol/phenergan) given in office today Levaquin antibiotics daily for 7 days - Your prescription(s) have been submitted to your pharmacy. Please take as directed and contact our office if you believe you are having problem(s) with the medication(s). Alternate between ibuprofen and tylenol for aches, pain and fever symptoms as discussed Hydrate, rest and call if worse or unimproved

## 2012-07-17 NOTE — Progress Notes (Signed)
  Subjective:    HPI  complains of cough x 1 week Increase migraine symptoms (typical usual headache spells), no relieved with Norco as ongoing  Past Medical History  Diagnosis Date  . Depressive disorder, not elsewhere classified   . Other and unspecified hyperlipidemia   . Unspecified essential hypertension   . Migraine, unspecified, without mention of intractable migraine without mention of status migrainosus   . Unspecified urinary incontinence   . Arthritis   . Chronic bronchitis   . Anxiety state, unspecified   . Postmenopausal symptoms   . Panic attacks     Review of Systems Constitutional: No unexpected weight change Pulmonary: No pleurisy or hemoptysis Cardiovascular: No chest pain or palpitations     Objective:   Physical Exam BP 142/80  Pulse 104  Temp 99 F (37.2 C) (Oral)  Resp 12  Wt 201 lb 0.6 oz (91.191 kg)  SpO2 97% GEN: mildly ill appearing and audible head/chest congestion -friend at side HENT: NCAT, mild sinus tenderness bilaterally, nares with clear discharge, oropharynx mild erythema, no exudate Eyes: Vision grossly intact, no conjunctivitis Lungs: scattered bilateral rhonchi, mild end exp wheeze, no increased work of breathing Cardiovascular: Regular rate and rhythm, no bilateral edema Neuro: AAOx4, CN2-12 sym intact - speech fluent, MAE well, gait intact  Lab Results  Component Value Date   WBC 14.3* 12/05/2010   HGB 12.1 12/05/2010   HCT 36.2 12/05/2010   PLT 389.0 12/05/2010   GLUCOSE 73 12/05/2010   CHOL 176 12/05/2010   TRIG 274.0* 12/05/2010   HDL 48.00 12/05/2010   LDLDIRECT 96.3 12/05/2010   ALT 18 12/05/2010   AST 17 12/05/2010   NA 138 12/05/2010   K 4.2 12/05/2010   CL 99 12/05/2010   CREATININE 0.7 12/05/2010   BUN 17 12/05/2010   CO2 29 12/05/2010   TSH 2.149 02/28/2011   HGBA1C 5.9 05/12/2012       Assessment & Plan:  Acute asthma exacerbation Viral URI >progression to acute bronchitis Cough, postnasal drip related to above chronic migraine,  exacerbated by above - no neuro deficits   Steroids for bronchospasm - IM Medrol 120 Continue Alb neb prn as ongoing Empiric antibiotics prescribed due to symptom duration greater than 7 days and comorbid disease IM demerol 50/prometh 25 for migraine at pt request Symptomatic care with Tylenol or Advil, hydration and rest -  salt gargle advised as needed

## 2012-07-28 ENCOUNTER — Other Ambulatory Visit: Payer: Self-pay | Admitting: Internal Medicine

## 2012-07-28 NOTE — Telephone Encounter (Signed)
Called [facility] spoke with Emmanual gave auth...Raechel Chute

## 2012-08-11 ENCOUNTER — Ambulatory Visit (INDEPENDENT_AMBULATORY_CARE_PROVIDER_SITE_OTHER): Payer: Self-pay | Admitting: Internal Medicine

## 2012-08-11 ENCOUNTER — Encounter: Payer: Self-pay | Admitting: Internal Medicine

## 2012-08-11 VITALS — BP 122/74 | HR 93 | Temp 97.8°F | Ht 67.0 in | Wt 207.0 lb

## 2012-08-11 DIAGNOSIS — G43909 Migraine, unspecified, not intractable, without status migrainosus: Secondary | ICD-10-CM

## 2012-08-11 MED ORDER — MEPERIDINE HCL 100 MG/ML IJ SOLN
100.0000 mg | Freq: Once | INTRAMUSCULAR | Status: AC
  Administered 2012-08-11: 100 mg via INTRAMUSCULAR

## 2012-08-11 MED ORDER — PROMETHAZINE HCL 25 MG/ML IJ SOLN
50.0000 mg | Freq: Once | INTRAMUSCULAR | Status: AC
  Administered 2012-08-11: 50 mg via INTRAMUSCULAR

## 2012-08-11 NOTE — Progress Notes (Signed)
Subjective:    Patient ID: Gloria [name], female    DOB: March 28, 1951, 62 y.o.   MRN: [id number]  HPI  Pt presents to the clinic today with c/o migraine. This started 3 days ago. She has not had aura. She does have a history of recurrent migraines. She has had associated nausea. She has tried to sleep it off but nothing has helped. She has taken her xanax, elavil, norco, and phenergan without any relief.   Review of Systems  Past Medical History  Diagnosis Date  . Depressive disorder, not elsewhere classified   . Other and unspecified hyperlipidemia   . Unspecified essential hypertension   . Migraine, unspecified, without mention of intractable migraine without mention of status migrainosus   . Unspecified urinary incontinence   . Arthritis   . Chronic bronchitis   . Anxiety state, unspecified   . Postmenopausal symptoms   . Panic attacks     Current Outpatient Prescriptions  Medication Sig Dispense Refill  . albuterol (PROVENTIL) (2.5 MG/3ML) 0.083% nebulizer solution Take 3 mLs (2.5 mg total) by nebulization every 4 (four) hours as needed for wheezing.  25 vial  2  . ALPRAZolam (XANAX) 1 MG tablet TAKE ONE TABLET BY MOUTH THREE TIMES DAILY AS NEEDED FOR ANXIETY AND PANIC SYMPTOMS.  90 tablet  0  . amitriptyline (ELAVIL) 10 MG tablet Take 1 tablet (10 mg total) by mouth at bedtime.  30 tablet  3  . CALCIUM-MAGNESIUM-ZINC PO Take 1 capsule by mouth daily.       . Cyanocobalamin (VITAMIN B 12 PO) Take 1 capsule by mouth daily.        . cyclobenzaprine (FLEXERIL) 5 MG tablet Take 1 tablet (5 mg total) by mouth every 8 (eight) hours as needed for muscle spasms.  30 tablet  1  . HYDROcodone-acetaminophen (NORCO) 5-325 MG per tablet Take 1 tablet by mouth every 8 (eight) hours as needed for pain. Do not fill more than once every 30 days  40 tablet  1  . lisinopril-hydrochlorothiazide (PRINZIDE,ZESTORETIC) 20-25 MG per tablet TAKE ONE TABLET BY MOUTH EVERY DAY  90 tablet  1  . loratadine  (CLARITIN) 10 MG tablet Take 1 tablet (10 mg total) by mouth daily.  30 tablet  11  . Mometasone Furo-Formoterol Fum (DULERA) 200-5 MCG/ACT AERO Inhale 2 puffs into the lungs 2 (two) times daily.  3 Inhaler  0  . Multiple Vitamins-Minerals (CENTRUM SILVER PO) Take 1 capsule by mouth daily.        . Omega-3 Fatty Acids (FISH OIL) 1200 MG CAPS Take 1 capsule by mouth daily.        . citalopram (CELEXA) 10 MG tablet Take 1 tablet (10 mg total) by mouth daily.  30 tablet  5  . promethazine (PHENERGAN) 12.5 MG tablet 1-2 pills every 4 hr as needed for nausea.  50 tablet  1   No current facility-administered medications for this visit.    Allergies  Allergen Reactions  . Lisinopril     cough  . Codeine Itching  . Compazine     Stroke like symptoms  . Haloperidol Decanoate Other (See Comments)    Extrapyramidal syndrome  . Penicillins Nausea And Vomiting    Family History  Problem Relation Age of Onset  . Arthritis      family history  . Hypertension      grandparent  . Colon cancer Maternal Grandfather     History   Social History  . Marital Status: Divorced  Spouse Name: N/A    Number of Children: 1  . Years of Education: N/A   Occupational History  . Self employed-interior design/flower arrangements    Social History Main Topics  . Smoking status: Never Smoker   . Smokeless tobacco: Never Used  . Alcohol Use: No  . Drug Use: No  . Sexually Active: Not Currently   Other Topics Concern  . Not on file   Social History Narrative  . No narrative on file     Constitutional: Pt reports migraine. Denies fever, malaise, fatigue, or abrupt weight changes.  Gastrointestinal: Pt reports nausea. Denies abdominal pain, bloating, constipation, diarrhea or blood in the stool.  Neurological: Denies dizziness, difficulty with memory, difficulty with speech or problems with balance and coordination.   No other specific complaints in a complete review of systems (except as  listed in HPI above).     Objective:   Physical Exam  BP 122/74  Pulse 93  Temp(Src) 97.8 F (36.6 C) (Oral)  Ht 5\' 7"  (1.702 m)  Wt 207 lb (93.895 kg)  BMI 32.41 kg/m2  SpO2 96% Wt Readings from Last 3 Encounters:  08/11/12 207 lb (93.895 kg)  07/17/12 201 lb 0.6 oz (91.191 kg)  06/29/12 191 lb (86.637 kg)    General: Appears her stated age, well developed, well nourished in NAD.  Cardiovascular: Normal rate and rhythm. S1,S2 noted.  No murmur, rubs or gallops noted. No JVD or BLE edema. No carotid bruits noted. Pulmonary/Chest: Normal effort and positive vesicular breath sounds. No respiratory distress. No wheezes, rales or ronchi noted.  Abdomen: Soft and nontender. Normal bowel sounds, no bruits noted. No distention or masses noted. Liver, spleen and kidneys non palpable. Neurological: Alert and oriented. Cranial nerves II-XII intact. Coordination normal. +DTRs bilaterally.        Assessment & Plan:   Migraine, new onset with additional workup required:  100 mg demerol IM 50 mg phenregan IM  RTC as needed or if symptoms persist

## 2012-08-11 NOTE — Addendum Note (Signed)
Addended by: Carin Primrose on: 08/11/2012 04:49 PM   Modules accepted: Orders

## 2012-08-11 NOTE — Patient Instructions (Signed)
Migraine Headache A migraine headache is an intense, throbbing pain on one or both sides of your head. A migraine can last for 30 minutes to several hours. CAUSES  The exact cause of a migraine headache is not always known. However, a migraine may be caused when nerves in the brain become irritated and release chemicals that cause inflammation. This causes pain. SYMPTOMS  Pain on one or both sides of your head.  Pulsating or throbbing pain.  Severe pain that prevents daily activities.  Pain that is aggravated by any physical activity.  Nausea, vomiting, or both.  Dizziness.  Pain with exposure to bright lights, loud noises, or activity.  General sensitivity to bright lights, loud noises, or smells. Before you get a migraine, you may get warning signs that a migraine is coming (aura). An aura may include:  Seeing flashing lights.  Seeing bright spots, halos, or zig-zag lines.  Having tunnel vision or blurred vision.  Having feelings of numbness or tingling.  Having trouble talking.  Having muscle weakness. MIGRAINE TRIGGERS  Alcohol.  Smoking.  Stress.  Menstruation.  Aged cheeses.  Foods or drinks that contain nitrates, glutamate, aspartame, or tyramine.  Lack of sleep.  Chocolate.  Caffeine.  Hunger.  Physical exertion.  Fatigue.  Medicines used to treat chest pain (nitroglycerine), birth control pills, estrogen, and some blood pressure medicines. DIAGNOSIS  A migraine headache is often diagnosed based on:  Symptoms.  Physical examination.  A CT scan or MRI of your head. TREATMENT Medicines may be given for pain and nausea. Medicines can also be given to help prevent recurrent migraines.  HOME CARE INSTRUCTIONS  Only take over-the-counter or prescription medicines for pain or discomfort as directed by your caregiver. The use of long-term narcotics is not recommended.  Lie down in a dark, quiet room when you have a migraine.  Keep a journal  to find out what may trigger your migraine headaches. For example, write down:  What you eat and drink.  How much sleep you get.  Any change to your diet or medicines.  Limit alcohol consumption.  Quit smoking if you smoke.  Get 7 to 9 hours of sleep, or as recommended by your caregiver.  Limit stress.  Keep lights dim if bright lights bother you and make your migraines worse. SEEK IMMEDIATE MEDICAL CARE IF:   Your migraine becomes severe.  You have a fever.  You have a stiff neck.  You have vision loss.  You have muscular weakness or loss of muscle control.  You start losing your balance or have trouble walking.  You feel faint or pass out.  You have severe symptoms that are different from your first symptoms. MAKE SURE YOU:   Understand these instructions.  Will watch your condition.  Will get help right away if you are not doing well or get worse. Document Released: 06/17/2005 Document Revised: 09/09/2011 Document Reviewed: 06/07/2011 [employer] Patient Information 2013 [employer].  

## 2012-08-20 ENCOUNTER — Encounter: Payer: Self-pay | Admitting: Internal Medicine

## 2012-08-20 ENCOUNTER — Ambulatory Visit (INDEPENDENT_AMBULATORY_CARE_PROVIDER_SITE_OTHER): Payer: Self-pay | Admitting: Internal Medicine

## 2012-08-20 VITALS — BP 162/100 | HR 110 | Temp 98.2°F | Ht 67.0 in | Wt 193.1 lb

## 2012-08-20 DIAGNOSIS — F411 Generalized anxiety disorder: Secondary | ICD-10-CM

## 2012-08-20 DIAGNOSIS — R110 Nausea: Secondary | ICD-10-CM

## 2012-08-20 MED ORDER — PROPRANOLOL HCL 10 MG PO TABS: 10.0000 mg | ORAL_TABLET | Freq: Three times a day (TID) | ORAL | Status: DC

## 2012-08-20 MED ORDER — PROMETHAZINE HCL 25 MG/ML IJ SOLN
25.0000 mg | Freq: Once | INTRAMUSCULAR | Status: AC
  Administered 2012-08-20: 25 mg via INTRAMUSCULAR

## 2012-08-20 MED ORDER — PAROXETINE HCL 10 MG PO TABS: 10.0000 mg | ORAL_TABLET | ORAL | Status: DC

## 2012-08-20 MED ORDER — TRAZODONE HCL 50 MG PO TABS: 50.0000 mg | ORAL_TABLET | Freq: Every day | ORAL | Status: DC

## 2012-08-20 NOTE — Progress Notes (Signed)
  Subjective:    Patient ID: Gloria [name], female    DOB: [birth year], 62 y.o.   MRN: [id number]  Anxiety Presents for follow-up visit. Symptoms include chest pain, decreased concentration, depressed mood, dizziness, dry mouth, excessive worry, feeling of choking, insomnia, malaise, nausea, nervous/anxious behavior, palpitations, panic, restlessness and shortness of breath. Symptoms occur constantly. The severity of symptoms is causing significant distress. The quality of sleep is poor. Nighttime awakenings: several.    reports nausea related to uncontrolled anxiety symptoms - request phenergan shot  Past Medical History  Diagnosis Date  . Depressive disorder, not elsewhere classified   . Other and unspecified hyperlipidemia   . Unspecified essential hypertension   . Migraine, unspecified, without mention of intractable migraine without mention of status migrainosus   . Unspecified urinary incontinence   . Arthritis   . Chronic bronchitis   . Anxiety state, unspecified   . Postmenopausal symptoms   . Panic attacks      Review of Systems  Constitutional: Positive for fatigue. Negative for fever.  Respiratory: Positive for shortness of breath.   Cardiovascular: Positive for chest pain and palpitations.  Gastrointestinal: Positive for nausea.  Neurological: Positive for dizziness and tremors. Negative for weakness and headaches.  Psychiatric/Behavioral: Positive for decreased concentration. The patient is nervous/anxious and has insomnia.        Objective:   Physical Exam  BP 162/100  Pulse 110  Temp(Src) 98.2 F (36.8 C) (Oral)  Ht 5\' 7"  (1.702 m)  Wt 193 lb 2 oz (87.601 kg)  BMI 30.24 kg/m2  SpO2 98% Wt Readings from Last 3 Encounters:  08/20/12 193 lb 2 oz (87.601 kg)  08/11/12 207 lb (93.895 kg)  07/17/12 201 lb 0.6 oz (91.191 kg)   Constitutional: She is obese, but appears well-developed and well-nourished. Emotional but no physical distress.  Neck: Normal range of  motion. Neck supple. No JVD present. No thyromegaly present.  Cardiovascular: Normal rate, regular rhythm and normal heart sounds.  No murmur heard. No BLE edema. Pulmonary/Chest: Effort normal and breath sounds normal. No respiratory distress. She has no wheezes. Abdomen: SNTND+BS  Psychiatric: She has a tearful and anxious mood and affect. Her behavior is normal. Judgment and thought content normal.   Lab Results  Component Value Date   WBC 14.3* 12/05/2010   HGB 12.1 12/05/2010   HCT 36.2 12/05/2010   PLT 389.0 12/05/2010   GLUCOSE 73 12/05/2010   CHOL 176 12/05/2010   TRIG 274.0* 12/05/2010   HDL 48.00 12/05/2010   LDLDIRECT 96.3 12/05/2010   ALT 18 12/05/2010   AST 17 12/05/2010   NA 138 12/05/2010   K 4.2 12/05/2010   CL 99 12/05/2010   CREATININE 0.7 12/05/2010   BUN 17 12/05/2010   CO2 29 12/05/2010   TSH 2.149 02/28/2011   HGBA1C 5.9 05/12/2012       Assessment & Plan:   Nausea alone - IM promethazine today  Insomnia - chronic - will rx non controlled substance for tx of same, traz rx done

## 2012-08-20 NOTE — Assessment & Plan Note (Signed)
Intolerant of buspar - crisis with son getting DUI previously intolerant of paxil and sertraline trials - ?adequate duration as pt reluctant to take same tried citalopram 03/2012: caused nausea wants to re-try paxil - erx now - titrate up as tolerated Also provide propanolol prn tremor and traz 50 prn insomnia i declined to increase dose of BZ or change BZ - again explained risk of tolerance and evidence of same Verified no SI/HI today

## 2012-08-20 NOTE — Patient Instructions (Signed)
It was good to see you today. Phenergan injection given to you today for nausea symptoms Stop citalopram and begin generic Paxil for anxiety Take propanolol 3 times a day for tremors and anxiety symptoms to counter balance the "adrenaline" feeling Use trazodone when needed for sleep Your prescription(s) have been submitted to your pharmacy. Please take as directed and contact our office if you believe you are having problem(s) with the medication(s). Let us know if her nausea symptoms do not improve or if anxiety symptoms worse

## 2012-08-26 ENCOUNTER — Telehealth: Payer: Self-pay | Admitting: *Deleted

## 2012-08-26 NOTE — Telephone Encounter (Signed)
Pt states md rx paroxetine at least visit. Med is not helping she states she is so nervous. Wanting to get refill on her xanax until she is able to get in to see md @ [facility]. Have change pharmacy wanting med to sent to [facility]...Raechel Chute

## 2012-08-26 NOTE — Telephone Encounter (Signed)
Sorry, no. She should continue the Inderal and propanolol, will refill Xanax after 2/28 ( no more than 90 tablets per month) -

## 2012-08-27 NOTE — Telephone Encounter (Signed)
Called pt no answer LMOM md response...lmb 

## 2012-08-28 ENCOUNTER — Other Ambulatory Visit: Payer: Self-pay | Admitting: *Deleted

## 2012-08-28 MED ORDER — ALPRAZOLAM 1 MG PO TABS: ORAL_TABLET | ORAL | Status: DC

## 2012-08-28 NOTE — Telephone Encounter (Signed)
Pt requesting refill on her xanax. Change pharmacy to [facility]/[place]...Raechel Chute

## 2012-08-28 NOTE — Telephone Encounter (Signed)
Faxed script to [facility].../lmb 

## 2012-09-21 ENCOUNTER — Ambulatory Visit (INDEPENDENT_AMBULATORY_CARE_PROVIDER_SITE_OTHER): Payer: Self-pay | Admitting: Internal Medicine

## 2012-09-21 ENCOUNTER — Encounter: Payer: Self-pay | Admitting: Internal Medicine

## 2012-09-21 VITALS — BP 142/70 | HR 96 | Temp 98.3°F | Wt 204.8 lb

## 2012-09-21 DIAGNOSIS — G4700 Insomnia, unspecified: Secondary | ICD-10-CM

## 2012-09-21 DIAGNOSIS — M129 Arthropathy, unspecified: Secondary | ICD-10-CM

## 2012-09-21 DIAGNOSIS — F411 Generalized anxiety disorder: Secondary | ICD-10-CM

## 2012-09-21 DIAGNOSIS — F5104 Psychophysiologic insomnia: Secondary | ICD-10-CM

## 2012-09-21 MED ORDER — SERTRALINE HCL 100 MG PO TABS: 100.0000 mg | ORAL_TABLET | Freq: Every day | ORAL | Status: DC

## 2012-09-21 MED ORDER — ALPRAZOLAM 1 MG PO TABS: 1.0000 mg | ORAL_TABLET | Freq: Three times a day (TID) | ORAL | Status: DC | PRN

## 2012-09-21 NOTE — Assessment & Plan Note (Signed)
Intolerant of buspar - crisis with son getting DUI, housing issue previously intolerant of paxil and sertraline trials - ?adequate duration as pt reluctant to take same tried citalopram 03/2012: caused nausea Did not tolerate paxil 08/2012- retry sertraline now Prior trials propanolol prn tremor and traz 50 prn insomnia also ineffective i declined to increase dose of BZ or change BZ - again explained risk of tolerance and evidence of same Verified no SI/HI today Refer to [facility] and advised psyc care at [facility] if decline

## 2012-09-21 NOTE — Patient Instructions (Addendum)
It was good to see you today. begin generic zoloft for anxiety Your prescription(s) have been submitted to your pharmacy. Please take as directed and contact our office if you believe you are having problem(s) with the medication(s). Use Tylenol PM for sleep as needed  we'll make referral to behavioral health for counseling. Our office will contact you regarding appointment(s) once made. Anxiety and Panic Attacks Anxiety is your body's way of reacting to real danger or something you think is a danger. It may be fear or worry over a situation like losing your job. Sometimes the cause is not known. A panic attack is made up of physical signs like sweating, shaking, or chest pain. Anxiety and panic attacks may start suddenly. They may be strong. They may come at any time of day, even while sleeping. They may come at any time of life. Panic attacks are scary, but they do not harm you physically.  HOME CARE  Avoid any known causes of your anxiety.  Try to relax. Yoga may help. Tell yourself everything will be okay.  Exercise often.  Get expert advice and help (therapy) to stop anxiety or attacks from happening.  Avoid caffeine, alcohol, and drugs.  Only take medicine as told by your doctor. GET HELP RIGHT AWAY IF:  Your attacks seem different than normal attacks.  Your problems are getting worse or concern you. MAKE SURE YOU:  Understand these instructions.  Will watch your condition.  Will get help right away if you are not doing well or get worse. Document Released: 07/20/2010 Document Revised: 09/09/2011 Document Reviewed: 07/20/2010 [facility] Patient Information 2013 [place], [place].

## 2012-09-21 NOTE — Assessment & Plan Note (Signed)
BP Readings from Last 3 Encounters:  09/21/12 142/70  08/20/12 162/100  08/11/12 122/74  chronic pain in back, hips and knees - using NSAIDs in place of prior hydrocodone (i refused to increase supply of same) Refer to ortho if needed (liited by insurance options as known) The current medical regimen is reasonably effective;  continue present plan and medications.

## 2012-09-21 NOTE — Progress Notes (Signed)
  Subjective:    Patient ID: Gloria [name], female    DOB: 04/08/1951, 62 y.o.   MRN: [id number]  Anxiety Presents for follow-up visit. Symptoms include chest pain, decreased concentration, depressed mood, dizziness, dry mouth, excessive worry, insomnia, malaise, nausea, nervous/anxious behavior, palpitations, panic, restlessness and shortness of breath. Patient reports no suicidal ideas. Symptoms occur constantly. The severity of symptoms is causing significant distress and interfering with daily activities. The quality of sleep is poor. Nighttime awakenings: several.   Compliance with medications is 0-25%. Side effects of treatment include GI discomfort.  reports nausea related to uncontrolled anxiety symptoms -  Also unable to sleep -  Past Medical History  Diagnosis Date  . Depressive disorder, not elsewhere classified   . Other and unspecified hyperlipidemia   . Unspecified essential hypertension   . Migraine, unspecified, without mention of intractable migraine without mention of status migrainosus   . Unspecified urinary incontinence   . Arthritis   . Chronic bronchitis   . Anxiety state, unspecified   . Postmenopausal symptoms   . Panic attacks      Review of Systems  Constitutional: Positive for fatigue. Negative for fever.  Respiratory: Positive for shortness of breath.   Cardiovascular: Positive for chest pain and palpitations.  Gastrointestinal: Positive for nausea.  Neurological: Positive for dizziness and tremors. Negative for weakness and headaches.  Psychiatric/Behavioral: Positive for decreased concentration. Negative for suicidal ideas. The patient is nervous/anxious and has insomnia.        Objective:   Physical Exam  BP 142/70  Pulse 96  Temp(Src) 98.3 F (36.8 C) (Oral)  Wt 204 lb 12.8 oz (92.897 kg)  BMI 32.07 kg/m2  SpO2 97% Wt Readings from Last 3 Encounters:  09/21/12 204 lb 12.8 oz (92.897 kg)  08/20/12 193 lb 2 oz (87.601 kg)  08/11/12 207 lb  (93.895 kg)   Constitutional: She is obese, but appears well-developed and well-nourished. Emotional but no physical distress.  Neck: Normal range of motion. Neck supple. No JVD present. No thyromegaly present.  Cardiovascular: Normal rate, regular rhythm and normal heart sounds.  No murmur heard. No BLE edema. Pulmonary/Chest: Effort normal and breath sounds normal. No respiratory distress. She has no wheezes. Psychiatric: She has a tearful and anxious mood and affect. Her behavior is normal. Judgment and thought content normal.   Lab Results  Component Value Date   WBC 14.3* 12/05/2010   HGB 12.1 12/05/2010   HCT 36.2 12/05/2010   PLT 389.0 12/05/2010   GLUCOSE 73 12/05/2010   CHOL 176 12/05/2010   TRIG 274.0* 12/05/2010   HDL 48.00 12/05/2010   LDLDIRECT 96.3 12/05/2010   ALT 18 12/05/2010   AST 17 12/05/2010   NA 138 12/05/2010   K 4.2 12/05/2010   CL 99 12/05/2010   CREATININE 0.7 12/05/2010   BUN 17 12/05/2010   CO2 29 12/05/2010   TSH 2.149 02/28/2011   HGBA1C 5.9 05/12/2012       Assessment & Plan:   Insomnia, chronic - advised OTC meds, non controlled substance for tx of same, intol of traz and TCAs in past

## 2012-10-19 ENCOUNTER — Other Ambulatory Visit: Payer: Self-pay | Admitting: Internal Medicine

## 2012-10-19 ENCOUNTER — Telehealth: Payer: Self-pay

## 2012-10-19 NOTE — Telephone Encounter (Signed)
Pt called stating that she is unable to tolerate Zoloft due to GI upset. Pt is requesting a refill of Xanax, please advise

## 2012-10-20 MED ORDER — ALPRAZOLAM 1 MG PO TABS: 1.0000 mg | ORAL_TABLET | Freq: Three times a day (TID) | ORAL | Status: DC | PRN

## 2012-10-20 NOTE — Telephone Encounter (Signed)
See previous phone msg rx already sent back to [facility]...lmb

## 2012-10-20 NOTE — Telephone Encounter (Signed)
Not before 10/22/12 - only #90 per month - thanks

## 2012-10-20 NOTE — Telephone Encounter (Signed)
Ok for xanax refill 4/24 or later

## 2012-10-20 NOTE — Telephone Encounter (Signed)
Rx faxed to pharmacy, pt advised  

## 2012-11-04 ENCOUNTER — Other Ambulatory Visit: Payer: Self-pay | Admitting: *Deleted

## 2012-11-04 MED ORDER — LISINOPRIL-HYDROCHLOROTHIAZIDE 20-25 MG PO TABS: ORAL_TABLET | ORAL | Status: DC

## 2012-11-11 ENCOUNTER — Telehealth: Payer: Self-pay | Admitting: Internal Medicine

## 2012-11-11 MED ORDER — IBUPROFEN 600 MG PO TABS: 600.0000 mg | ORAL_TABLET | Freq: Three times a day (TID) | ORAL | Status: DC | PRN

## 2012-11-11 NOTE — Telephone Encounter (Signed)
Called pt no answer LMOM rx sent to [facility].../lmb 

## 2012-11-11 NOTE — Telephone Encounter (Signed)
The patient called and is hoping to get an rx for a ibprofin (spelling..?) (stronger than what is over the counter).  She stated she the pain med she is using isn't working.  Her callback number is [phone]

## 2012-11-11 NOTE — Telephone Encounter (Signed)
Ibuprofen 600 mg every 8 hours as needed - erx done

## 2012-11-17 ENCOUNTER — Other Ambulatory Visit: Payer: Self-pay | Admitting: Internal Medicine

## 2012-11-17 NOTE — Telephone Encounter (Signed)
Faxed script bck to [facility].../lmb 

## 2012-12-16 ENCOUNTER — Other Ambulatory Visit: Payer: Self-pay | Admitting: Internal Medicine

## 2012-12-16 NOTE — Telephone Encounter (Signed)
Rx faxed to [facility]. 

## 2012-12-25 ENCOUNTER — Ambulatory Visit: Payer: Self-pay | Admitting: Internal Medicine

## 2013-01-11 ENCOUNTER — Encounter: Payer: Self-pay | Admitting: Internal Medicine

## 2013-01-11 ENCOUNTER — Ambulatory Visit (INDEPENDENT_AMBULATORY_CARE_PROVIDER_SITE_OTHER): Payer: No Typology Code available for payment source | Admitting: Internal Medicine

## 2013-01-11 VITALS — BP 112/72 | HR 103 | Temp 98.0°F | Wt 207.8 lb

## 2013-01-11 DIAGNOSIS — I10 Essential (primary) hypertension: Secondary | ICD-10-CM

## 2013-01-11 DIAGNOSIS — J4521 Mild intermittent asthma with (acute) exacerbation: Secondary | ICD-10-CM

## 2013-01-11 DIAGNOSIS — R059 Cough, unspecified: Secondary | ICD-10-CM

## 2013-01-11 DIAGNOSIS — R05 Cough: Secondary | ICD-10-CM

## 2013-01-11 DIAGNOSIS — J45901 Unspecified asthma with (acute) exacerbation: Secondary | ICD-10-CM

## 2013-01-11 MED ORDER — MOMETASONE FURO-FORMOTEROL FUM 200-5 MCG/ACT IN AERO: 2.0000 | INHALATION_SPRAY | Freq: Two times a day (BID) | RESPIRATORY_TRACT | Status: DC

## 2013-01-11 MED ORDER — PREDNISONE (PAK) 10 MG PO TABS: 10.0000 mg | ORAL_TABLET | ORAL | Status: DC

## 2013-01-11 MED ORDER — LOSARTAN POTASSIUM-HCTZ 50-12.5 MG PO TABS: 1.0000 | ORAL_TABLET | Freq: Every day | ORAL | Status: DC

## 2013-01-11 NOTE — Progress Notes (Signed)
Subjective:    Patient ID: Gloria [name], female    DOB: [birth year], 62 y.o.   MRN: [id number]  Asthma She complains of chest tightness, cough, difficulty breathing, hoarse voice, shortness of breath, sputum production and wheezing. This is a recurrent problem. The current episode started 1 to 4 weeks ago. The problem occurs daily. The problem has been waxing and waning. The cough is productive of sputum and hacking. Associated symptoms include chest pain, dyspnea on exertion, ear congestion, malaise/fatigue, nasal congestion and postnasal drip. Pertinent negatives include no ear pain, fever, headaches or weight loss. Her symptoms are aggravated by any activity and change in weather. Her symptoms are alleviated by beta-agonist. She reports minimal improvement on treatment. Her symptoms are not alleviated by anxiolytic. Her past medical history is significant for asthma.    Past Medical History  Diagnosis Date  . Depressive disorder, not elsewhere classified   . Other and unspecified hyperlipidemia   . Unspecified essential hypertension   . Migraine, unspecified, without mention of intractable migraine without mention of status migrainosus   . Unspecified urinary incontinence   . Arthritis   . Chronic bronchitis   . Anxiety state, unspecified   . Postmenopausal symptoms   . Panic attacks      Review of Systems  Constitutional: Positive for malaise/fatigue and fatigue. Negative for fever and weight loss.  HENT: Positive for hoarse voice and postnasal drip. Negative for ear pain.   Respiratory: Positive for cough, sputum production, shortness of breath and wheezing.   Cardiovascular: Positive for chest pain, dyspnea on exertion and palpitations.  Gastrointestinal: Positive for nausea.  Neurological: Positive for dizziness and tremors. Negative for weakness and headaches.  Psychiatric/Behavioral: Positive for decreased concentration. Negative for suicidal ideas. The patient is nervous/anxious  and has insomnia.        Objective:   Physical Exam  BP 112/72  Pulse 103  Temp(Src) 98 F (36.7 C) (Oral)  Wt 207 lb 12.8 oz (94.257 kg)  BMI 32.54 kg/m2  SpO2 97% Wt Readings from Last 3 Encounters:  01/11/13 207 lb 12.8 oz (94.257 kg)  09/21/12 204 lb 12.8 oz (92.897 kg)  08/20/12 193 lb 2 oz (87.601 kg)   Constitutional: She is obese, but appears well-developed and well-nourished. Emotional but no physical distress.  Neck: Normal range of motion. Neck supple. No JVD present. No thyromegaly present.  Cardiovascular: Normal rate, regular rhythm and normal heart sounds.  No murmur heard. No BLE edema. Pulmonary/Chest: focal cord dysfunction with pseudo-wheeze evident, improved with pursed lip breathing -Effort normal at rest, faint end expiratory wheeze persist. No respiratory distress. She has no wheezes. Psychiatric: She has an anxious mood and affect. Her behavior is normal. Judgment and thought content normal.   Lab Results  Component Value Date   WBC 14.3* 12/05/2010   HGB 12.1 12/05/2010   HCT 36.2 12/05/2010   PLT 389.0 12/05/2010   GLUCOSE 73 12/05/2010   CHOL 176 12/05/2010   TRIG 274.0* 12/05/2010   HDL 48.00 12/05/2010   LDLDIRECT 96.3 12/05/2010   ALT 18 12/05/2010   AST 17 12/05/2010   NA 138 12/05/2010   K 4.2 12/05/2010   CL 99 12/05/2010   CREATININE 0.7 12/05/2010   BUN 17 12/05/2010   CO2 29 12/05/2010   TSH 2.149 02/28/2011   HGBA1C 5.9 05/12/2012       Assessment & Plan:   Acute asthma exacerbation - triggered by AR triggers - no evidence of hx or exam for infection. Tx  with pred taper x 6 days, consider inhaled steroid if recurrent or uncontrolled symptoms continue antihistamine and Alb neb prn also DC ACEI - see next re: HTN tx

## 2013-01-11 NOTE — Assessment & Plan Note (Signed)
previously given [facility] sample by Dr Yetta Barre during lats attack 03/2012, but unable to afford same - New sample given today - instructed on proper use/indications

## 2013-01-11 NOTE — Assessment & Plan Note (Signed)
BP Readings from Last 3 Encounters:  01/11/13 112/72  09/21/12 142/70  08/20/12 162/100   Due to the cough she will stop the ACEI Change to ARB with diuretc - Recheck BP in 3-4 weeks and titrate as needed

## 2013-01-11 NOTE — Patient Instructions (Signed)
It was good to see you today. If you develop worsening symptoms or fever, call and we can reconsider antibiotics, but it does not appear necessary to use antibiotics at this time. For the wheezing, cough and congestion, take prednisone taper over next 6 days Resume Dulera for asthma control: one inhalation twice daily -sample given to you today Stop lisinopril HCTZ because of cough Begin losartan with HCTZ for blood pressure control Your prescription(s) have been submitted to your pharmacy. Please take as directed and contact our office if you believe you are having problem(s) with the medication(s). Recheck at this office for nurse visit only to recheck blood pressure on new medication, please call sooner if problems Followup with me in 3-4 months to monitor her blood pressure, anxiety and other medical issues, please call sooner if problems

## 2013-01-15 ENCOUNTER — Other Ambulatory Visit: Payer: Self-pay | Admitting: Internal Medicine

## 2013-01-15 NOTE — Telephone Encounter (Signed)
Last written 12/16/2012 #90 with 0 refills by NP-please advise.

## 2013-01-15 NOTE — Telephone Encounter (Signed)
Rx faxed to [facility]. 

## 2013-02-04 IMAGING — CR DG CHEST 2V
2 series · 2 of 2 positions shown · non-contrast
Comparison: 03/12/2005

CLINICAL DATA: Bronchitis.  Cough.  Short of breath.  Nonsmoker.

CHEST - 2 VIEW

[view not recorded (1 of 2)]
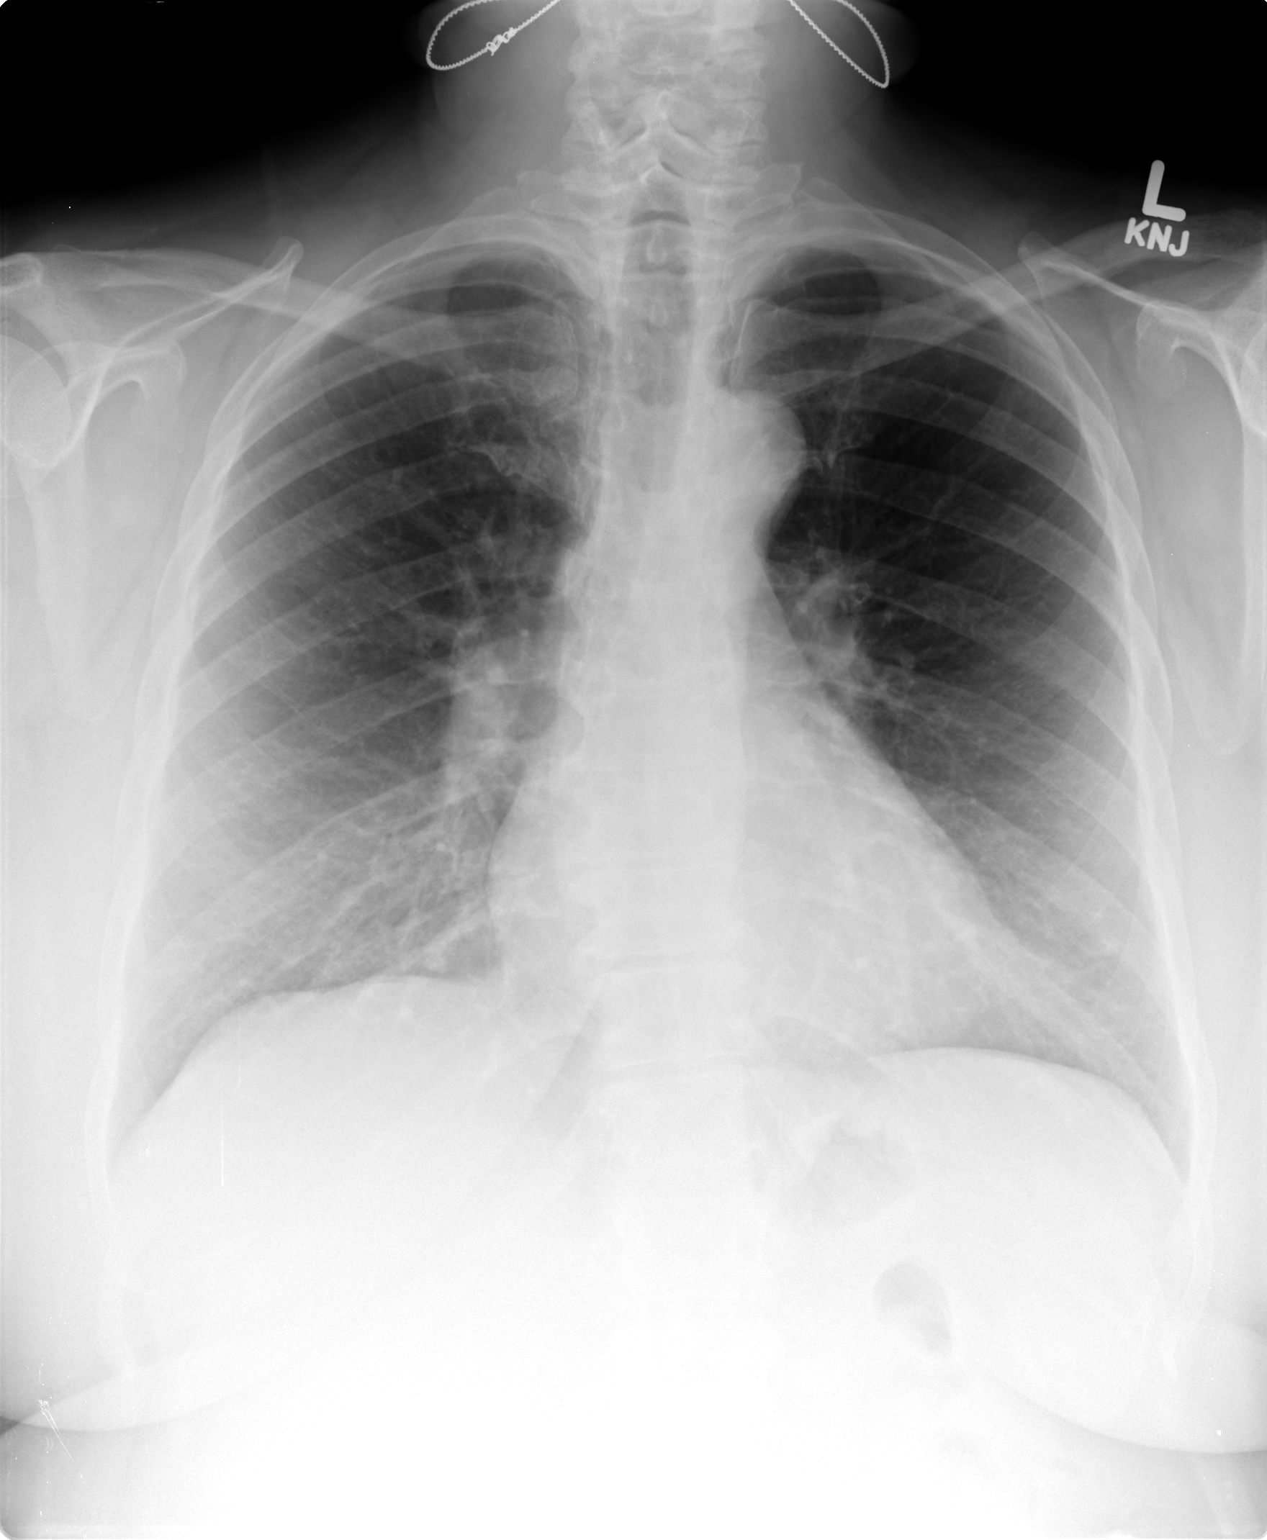

[view not recorded (2 of 2)]
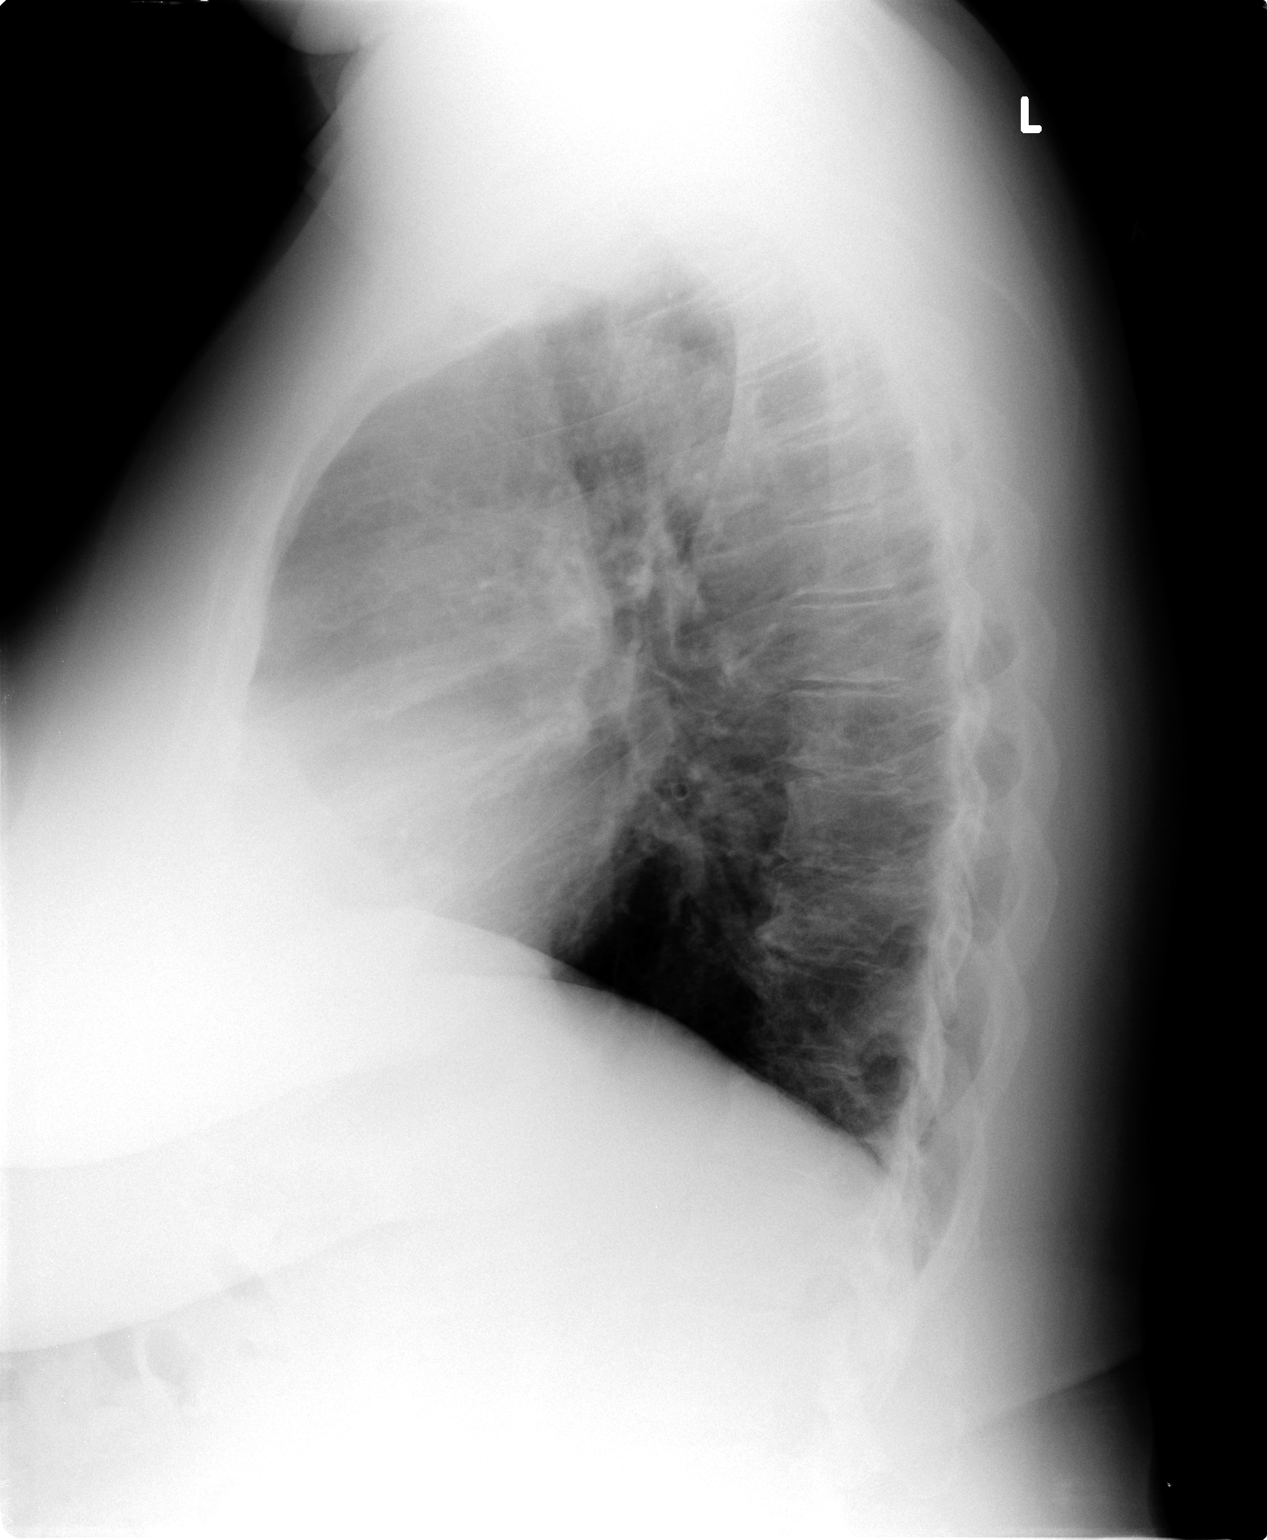

[2 of 2 positions shown; findings below may reference images not displayed]

FINDINGS: Mild lower thoracic spondylosis. Midline trachea.  Normal
heart size and mediastinal contours. No pleural effusion or
pneumothorax.  Clear lungs.
IMPRESSION: No acute cardiopulmonary disease.

## 2013-02-16 ENCOUNTER — Other Ambulatory Visit: Payer: Self-pay | Admitting: *Deleted

## 2013-02-16 MED ORDER — ALPRAZOLAM 1 MG PO TABS: 1.0000 mg | ORAL_TABLET | Freq: Three times a day (TID) | ORAL | Status: DC | PRN

## 2013-02-16 NOTE — Telephone Encounter (Signed)
Faxed script back to [facility].../lmb 

## 2013-03-18 ENCOUNTER — Telehealth: Payer: Self-pay | Admitting: Internal Medicine

## 2013-03-18 NOTE — Telephone Encounter (Signed)
The patient called and is hoping to get a medicine called in for a migraine.  She was offered several apts today, but refused stating she wanted to speak with Lucy.    Patient's callback number -  [phone]

## 2013-03-22 NOTE — Telephone Encounter (Signed)
I was out of these office thurs & fri. Called pt back today no answer LMOM RTC...lmb

## 2013-03-30 ENCOUNTER — Ambulatory Visit (INDEPENDENT_AMBULATORY_CARE_PROVIDER_SITE_OTHER): Payer: No Typology Code available for payment source | Admitting: Internal Medicine

## 2013-03-30 ENCOUNTER — Encounter: Payer: Self-pay | Admitting: Internal Medicine

## 2013-03-30 VITALS — BP 130/88 | HR 103 | Temp 97.9°F | Ht 67.0 in | Wt 216.2 lb

## 2013-03-30 DIAGNOSIS — G43909 Migraine, unspecified, not intractable, without status migrainosus: Secondary | ICD-10-CM

## 2013-03-30 DIAGNOSIS — J309 Allergic rhinitis, unspecified: Secondary | ICD-10-CM

## 2013-03-30 DIAGNOSIS — J019 Acute sinusitis, unspecified: Secondary | ICD-10-CM | POA: Insufficient documentation

## 2013-03-30 HISTORY — DX: Allergic rhinitis, unspecified: J30.9

## 2013-03-30 MED ORDER — MEPERIDINE HCL 50 MG/ML IJ SOLN
50.0000 mg | Freq: Once | INTRAMUSCULAR | Status: AC
  Administered 2013-03-30: 50 mg via INTRAMUSCULAR

## 2013-03-30 MED ORDER — MEPERIDINE HCL 50 MG/ML IJ SOLN
25.0000 mg | Freq: Once | INTRAMUSCULAR | Status: AC
  Administered 2013-03-30: 25 mg via INTRAMUSCULAR

## 2013-03-30 MED ORDER — PROMETHAZINE HCL 25 MG/ML IJ SOLN
25.0000 mg | Freq: Once | INTRAMUSCULAR | Status: AC
  Administered 2013-03-30: 25 mg via INTRAMUSCULAR

## 2013-03-30 MED ORDER — CETIRIZINE HCL 10 MG PO TABS: 10.0000 mg | ORAL_TABLET | Freq: Every day | ORAL | Status: DC

## 2013-03-30 MED ORDER — AZITHROMYCIN 250 MG PO TABS: ORAL_TABLET | ORAL | Status: DC

## 2013-03-30 NOTE — Progress Notes (Signed)
Subjective:    Patient ID: Gloria [name], female    DOB: 26-Nov-1950, 62 y.o.   MRN: [id number]  HPI   Here with 2-3 days acute onset fever, facial pain, pressure, headache, general weakness and malaise, and greenish d/c, with mild ST and cough, but pt denies chest pain, wheezing, increased sob or doe, orthopnea, PND, increased LE swelling, palpitations, dizziness or syncope. Also with 5 days onset typical migraine, long hx of same, starting as teen, with severe pain, photophobia, n/v. Does have several wks ongoing nasal allergy symptoms with clearish congestion, itch and sneezing, without fever, pain, ST, cough, swelling or wheezing.  Claritin no longer working Past Medical History  Diagnosis Date  . Depressive disorder, not elsewhere classified   . Other and unspecified hyperlipidemia   . Unspecified essential hypertension   . Migraine, unspecified, without mention of intractable migraine without mention of status migrainosus   . Unspecified urinary incontinence   . Arthritis   . Chronic bronchitis   . Anxiety state, unspecified   . Postmenopausal symptoms   . Panic attacks    Past Surgical History  Procedure Laterality Date  . Abdominal hysterectomy  2002  . Dislocated shoulder      right  . Diagnostic laparoscopy    . Tumor right ankle      reports that she has never smoked. She has never used smokeless tobacco. She reports that she does not drink alcohol or use illicit drugs. family history includes Arthritis in an other family member; Colon cancer in her maternal grandfather; Hypertension in an other family member. Allergies  Allergen Reactions  . Lisinopril     cough  . Codeine Itching  . Compazine     Stroke like symptoms  . Haloperidol Decanoate Other (See Comments)    Extrapyramidal syndrome  . Penicillins Nausea And Vomiting   Current Outpatient Prescriptions on File Prior to Visit  Medication Sig Dispense Refill  . albuterol (PROVENTIL) (2.5 MG/3ML) 0.083% nebulizer  solution Take 3 mLs (2.5 mg total) by nebulization every 4 (four) hours as needed for wheezing.  25 vial  2  . ALPRAZolam (XANAX) 1 MG tablet Take 1 tablet (1 mg total) by mouth 3 (three) times daily as needed for sleep or anxiety. Do not fill more than once every 30 days  90 tablet  5  . CALCIUM-MAGNESIUM-ZINC PO Take 1 capsule by mouth daily.       . Cyanocobalamin (VITAMIN B 12 PO) Take 1 capsule by mouth daily.        . cyclobenzaprine (FLEXERIL) 5 MG tablet Take 1 tablet (5 mg total) by mouth every 8 (eight) hours as needed for muscle spasms.  30 tablet  1  . HYDROcodone-acetaminophen (NORCO) 5-325 MG per tablet Take 1 tablet by mouth every 8 (eight) hours as needed for pain. Do not fill more than once every 30 days  40 tablet  1  . ibuprofen (ADVIL,MOTRIN) 600 MG tablet Take 1 tablet (600 mg total) by mouth every 8 (eight) hours as needed for pain.  60 tablet  1  . losartan-hydrochlorothiazide (HYZAAR) 50-12.5 MG per tablet Take 1 tablet by mouth daily.  90 tablet  3  . mometasone-formoterol (DULERA) 200-5 MCG/ACT AERO Inhale 2 puffs into the lungs 2 (two) times daily.  3 Inhaler  0  . Multiple Vitamins-Minerals (CENTRUM SILVER PO) Take 1 capsule by mouth daily.        . Omega-3 Fatty Acids (FISH OIL) 1200 MG CAPS Take 1 capsule by  mouth daily.        . predniSONE (STERAPRED UNI-PAK) 10 MG tablet Take 1 tablet (10 mg total) by mouth as directed. As directed x 6 days  21 tablet  0  . promethazine (PHENERGAN) 12.5 MG tablet 1-2 pills every 4 hr as needed for nausea.  50 tablet  1  . sertraline (ZOLOFT) 100 MG tablet Take 1 tablet (100 mg total) by mouth daily.  30 tablet  3  . cyclobenzaprine (FLEXERIL) 10 MG tablet Take 1 tablet (10 mg total) by mouth 3 (three) times daily as needed for muscle spasms.  30 tablet  0  . loratadine (CLARITIN) 10 MG tablet Take 1 tablet (10 mg total) by mouth daily.  30 tablet  11   No current facility-administered medications on file prior to visit.   Review of  Systems  Constitutional: Negative for unexpected weight change, or unusual diaphoresis  HENT: Negative for tinnitus.   Eyes: Negative for photophobia and visual disturbance.  Respiratory: Negative for choking and stridor.   Gastrointestinal: Negative for vomiting and blood in stool.  Genitourinary: Negative for hematuria and decreased urine volume.  Musculoskeletal: Negative for acute joint swelling Skin: Negative for color change and wound.  Neurological: Negative for tremors and numbness other than noted  Psychiatric/Behavioral: Negative for decreased concentration or  hyperactivity.        Objective:   Physical Exam BP 130/88  Pulse 103  Temp(Src) 97.9 F (36.6 C) (Oral)  Ht 5\' 7"  (1.702 m)  Wt 216 lb 4 oz (98.09 kg)  BMI 33.86 kg/m2  SpO2 98% VS noted, + photophobic Constitutional: Pt appears well-developed and well-nourished.  HENT: Head: NCAT.  Right Ear: External ear normal.  Left Ear: External ear normal. Bilat tm's with mild erythema.  Max sinus areas mild tender.  Pharynx with mild erythema, no exudate Eyes: Conjunctivae and EOM are normal. Pupils are equal, round, and reactive to light.  Neck: Normal range of motion. Neck supple.  Cardiovascular: Normal rate and regular rhythm.   Pulmonary/Chest: Effort normal and breath sounds normal.  Abd:  Soft, NT, non-distended, + BS Neurological: Pt is alert. Not confused , cn 2-12 intact, motor 5/5 Skin: Skin is warm. No erythema.  Psychiatric: Pt behavior is normal. Thought content normal.  1+ nervous      Assessment & Plan:

## 2013-03-30 NOTE — Patient Instructions (Signed)
You had the demerol/phenergan shot today Please take all new medication as prescribed - the antibiotic, and zyrtec You can also take Delsym OTC for cough, and/or Mucinex (or it's generic off brand) for congestion Please continue all other medications as before, and refills have been done if requested. Please have the pharmacy call with any other refills you may need.  Please remember to sign up for My Chart if you have not done so, as this will be important to you in the future with finding out test results, communicating by private email, and scheduling acute appointments online when needed.

## 2013-03-30 NOTE — Assessment & Plan Note (Signed)
Ok to change claritiin to zyrtec prn,  to f/u any worsening symptoms or concerns

## 2013-03-30 NOTE — Assessment & Plan Note (Signed)
With current episode x 5 days, severe with n/v, for demerol/phenergan IM,  to f/u any worsening symptoms or concerns

## 2013-03-30 NOTE — Addendum Note (Signed)
Addended by: Scharlene Gloss B on: 03/30/2013 04:41 PM   Modules accepted: Orders

## 2013-03-30 NOTE — Assessment & Plan Note (Signed)
Mild to mod, for antibx course,  to f/u any worsening symptoms or concerns 

## 2013-04-01 ENCOUNTER — Encounter: Payer: Self-pay | Admitting: Internal Medicine

## 2013-04-01 ENCOUNTER — Ambulatory Visit (INDEPENDENT_AMBULATORY_CARE_PROVIDER_SITE_OTHER): Payer: No Typology Code available for payment source | Admitting: Internal Medicine

## 2013-04-01 VITALS — BP 140/82 | HR 120 | Temp 99.9°F | Ht 67.0 in | Wt 196.0 lb

## 2013-04-01 DIAGNOSIS — J309 Allergic rhinitis, unspecified: Secondary | ICD-10-CM

## 2013-04-01 DIAGNOSIS — G43909 Migraine, unspecified, not intractable, without status migrainosus: Secondary | ICD-10-CM

## 2013-04-01 DIAGNOSIS — J019 Acute sinusitis, unspecified: Secondary | ICD-10-CM

## 2013-04-01 MED ORDER — MEPERIDINE HCL 50 MG/ML IJ SOLN
50.0000 mg | Freq: Once | INTRAMUSCULAR | Status: AC
  Administered 2013-04-01: 50 mg via INTRAMUSCULAR

## 2013-04-01 MED ORDER — HYDROCODONE-ACETAMINOPHEN 5-325 MG PO TABS
1.0000 | ORAL_TABLET | Freq: Three times a day (TID) | ORAL | Status: DC | PRN
Start: 2013-04-01 — End: 2013-05-13

## 2013-04-01 MED ORDER — PROMETHAZINE HCL 25 MG/ML IJ SOLN
25.0000 mg | Freq: Once | INTRAMUSCULAR | Status: AC
  Administered 2013-04-01: 25 mg via INTRAMUSCULAR

## 2013-04-01 MED ORDER — METHYLPREDNISOLONE ACETATE 80 MG/ML IJ SUSP
120.0000 mg | Freq: Once | INTRAMUSCULAR | Status: AC
  Administered 2013-04-01: 120 mg via INTRAMUSCULAR

## 2013-04-01 NOTE — Assessment & Plan Note (Signed)
For repeat demerol.phenergan IM,  to f/u any worsening symptoms or concerns

## 2013-04-01 NOTE — Assessment & Plan Note (Signed)
Some improved, cont current tx,  to f/u any worsening symptoms or concerns

## 2013-04-01 NOTE — Patient Instructions (Signed)
You had the steroid shot today (the 120 mg), and the demerol 100/phenergan 50 mg Please continue all other medications as before, including the small prescription of vicodin Please have the pharmacy call with any other refills you may need.  Please remember to sign up for My Chart if you have not done so, as this will be important to you in the future with finding out test results, communicating by private email, and scheduling acute appointments online when needed.

## 2013-04-01 NOTE — Progress Notes (Signed)
Subjective:    Patient ID: Gloria [name], female    DOB: [birth year], 62 y.o.   MRN: [id number]  HPI  Here to f/u, had some relief with last visit tx, slept most of the night, but pain returned next day, left sided as before, without n/v, fever/  Infectious symptoms improved, but allergy symptoms some increased with congestion and post nasal gtt.  Pt denies new neurological symptoms of facial or extremity weakness or numbness.   Past Medical History  Diagnosis Date  . Depressive disorder, not elsewhere classified   . Other and unspecified hyperlipidemia   . Unspecified essential hypertension   . Migraine, unspecified, without mention of intractable migraine without mention of status migrainosus   . Unspecified urinary incontinence   . Arthritis   . Chronic bronchitis   . Anxiety state, unspecified   . Postmenopausal symptoms   . Panic attacks   . Allergic rhinitis, cause unspecified 03/30/2013   Past Surgical History  Procedure Laterality Date  . Abdominal hysterectomy  2002  . Dislocated shoulder      right  . Diagnostic laparoscopy    . Tumor right ankle      reports that she has never smoked. She has never used smokeless tobacco. She reports that she does not drink alcohol or use illicit drugs. family history includes Arthritis in an other family member; Colon cancer in her maternal grandfather; Hypertension in an other family member. Allergies  Allergen Reactions  . Lisinopril     cough  . Codeine Itching  . Compazine     Stroke like symptoms  . Haloperidol Decanoate Other (See Comments)    Extrapyramidal syndrome  . Penicillins Nausea And Vomiting   Current Outpatient Prescriptions on File Prior to Visit  Medication Sig Dispense Refill  . albuterol (PROVENTIL) (2.5 MG/3ML) 0.083% nebulizer solution Take 3 mLs (2.5 mg total) by nebulization every 4 (four) hours as needed for wheezing.  25 vial  2  . ALPRAZolam (XANAX) 1 MG tablet Take 1 tablet (1 mg total) by mouth 3  (three) times daily as needed for sleep or anxiety. Do not fill more than once every 30 days  90 tablet  5  . azithromycin (ZITHROMAX Z-PAK) 250 MG tablet Use as directed  6 each  1  . CALCIUM-MAGNESIUM-ZINC PO Take 1 capsule by mouth daily.       . cetirizine (ZYRTEC) 10 MG tablet Take 1 tablet (10 mg total) by mouth daily.  30 tablet  11  . Cyanocobalamin (VITAMIN B 12 PO) Take 1 capsule by mouth daily.        . cyclobenzaprine (FLEXERIL) 5 MG tablet Take 1 tablet (5 mg total) by mouth every 8 (eight) hours as needed for muscle spasms.  30 tablet  1  . ibuprofen (ADVIL,MOTRIN) 600 MG tablet Take 1 tablet (600 mg total) by mouth every 8 (eight) hours as needed for pain.  60 tablet  1  . losartan-hydrochlorothiazide (HYZAAR) 50-12.5 MG per tablet Take 1 tablet by mouth daily.  90 tablet  3  . mometasone-formoterol (DULERA) 200-5 MCG/ACT AERO Inhale 2 puffs into the lungs 2 (two) times daily.  3 Inhaler  0  . Multiple Vitamins-Minerals (CENTRUM SILVER PO) Take 1 capsule by mouth daily.        . Omega-3 Fatty Acids (FISH OIL) 1200 MG CAPS Take 1 capsule by mouth daily.        . predniSONE (STERAPRED UNI-PAK) 10 MG tablet Take 1 tablet (10 mg total) by  mouth as directed. As directed x 6 days  21 tablet  0  . promethazine (PHENERGAN) 12.5 MG tablet 1-2 pills every 4 hr as needed for nausea.  50 tablet  1  . sertraline (ZOLOFT) 100 MG tablet Take 1 tablet (100 mg total) by mouth daily.  30 tablet  3  . cyclobenzaprine (FLEXERIL) 10 MG tablet Take 1 tablet (10 mg total) by mouth 3 (three) times daily as needed for muscle spasms.  30 tablet  0  . loratadine (CLARITIN) 10 MG tablet Take 1 tablet (10 mg total) by mouth daily.  30 tablet  11   No current facility-administered medications on file prior to visit.   Review of Systems  Constitutional: Negative for unexpected weight change, or unusual diaphoresis  HENT: Negative for tinnitus.   Eyes: Negative for photophobia and visual disturbance.   Respiratory: Negative for choking and stridor.   Gastrointestinal: Negative for vomiting and blood in stool.   Musculoskeletal: Negative for acute joint swelling Skin: Negative for color change and wound.  Neurological: Negative for tremors and numbness other than noted  Psychiatric/Behavioral: Negative for decreased concentration or  hyperactivity.       Objective:   Physical Exam BP 140/82  Pulse 120  Temp(Src) 99.9 F (37.7 C) (Oral)  Ht 5\' 7"  (1.702 m)  Wt 196 lb (88.905 kg)  BMI 30.69 kg/m2  SpO2 95% VS noted,  Constitutional: Pt appears well-developed and well-nourished.  HENT: Head: NCAT.  Right Ear: External ear normal.  Left Ear: External ear normal.  Eyes: Conjunctivae and EOM are normal. Pupils are equal, round, and reactive to light.  Neck: Normal range of motion. Neck supple.  Cardiovascular: Normal rate and regular rhythm.   Pulmonary/Chest: Effort normal and breath sounds normal.  Neurological: Pt is alert. Not confused , + photophobic, motor 5/5 Skin: Skin is warm. No erythema.  Psychiatric: Pt behavior is normal. Thought content normal.     Assessment & Plan:

## 2013-04-01 NOTE — Assessment & Plan Note (Signed)
Mild to mod, for depomedrol IM,  to f/u any worsening symptoms or concerns 

## 2013-04-09 ENCOUNTER — Telehealth: Payer: Self-pay | Admitting: Internal Medicine

## 2013-04-09 NOTE — Telephone Encounter (Signed)
Call back attempted at 1426 on 10-10, vmail left.

## 2013-05-13 ENCOUNTER — Encounter: Payer: Self-pay | Admitting: Internal Medicine

## 2013-05-13 ENCOUNTER — Other Ambulatory Visit: Payer: Self-pay | Admitting: Internal Medicine

## 2013-05-13 ENCOUNTER — Telehealth: Payer: Self-pay | Admitting: Internal Medicine

## 2013-05-13 ENCOUNTER — Ambulatory Visit (INDEPENDENT_AMBULATORY_CARE_PROVIDER_SITE_OTHER): Payer: No Typology Code available for payment source | Admitting: Internal Medicine

## 2013-05-13 VITALS — BP 132/80 | HR 115 | Temp 98.2°F | Ht 67.0 in | Wt 222.1 lb

## 2013-05-13 DIAGNOSIS — J45901 Unspecified asthma with (acute) exacerbation: Secondary | ICD-10-CM

## 2013-05-13 DIAGNOSIS — J209 Acute bronchitis, unspecified: Secondary | ICD-10-CM | POA: Insufficient documentation

## 2013-05-13 DIAGNOSIS — G43909 Migraine, unspecified, not intractable, without status migrainosus: Secondary | ICD-10-CM

## 2013-05-13 DIAGNOSIS — I10 Essential (primary) hypertension: Secondary | ICD-10-CM

## 2013-05-13 MED ORDER — HYDROCODONE-ACETAMINOPHEN 5-325 MG PO TABS: 1.0000 | ORAL_TABLET | Freq: Three times a day (TID) | ORAL | Status: DC | PRN

## 2013-05-13 MED ORDER — MEPERIDINE HCL 25 MG/ML IJ SOLN
100.0000 mg | Freq: Once | INTRAMUSCULAR | Status: AC
  Administered 2013-05-13: 100 mg via INTRAMUSCULAR

## 2013-05-13 MED ORDER — METHYLPREDNISOLONE ACETATE 80 MG/ML IJ SUSP
120.0000 mg | Freq: Once | INTRAMUSCULAR | Status: AC
  Administered 2013-05-13: 120 mg via INTRAMUSCULAR

## 2013-05-13 MED ORDER — BENZONATATE 100 MG PO CAPS: ORAL_CAPSULE | ORAL | Status: DC

## 2013-05-13 MED ORDER — ALBUTEROL SULFATE (2.5 MG/3ML) 0.083% IN NEBU: 2.5000 mg | INHALATION_SOLUTION | RESPIRATORY_TRACT | Status: DC | PRN

## 2013-05-13 MED ORDER — AZITHROMYCIN 250 MG PO TABS: ORAL_TABLET | ORAL | Status: DC

## 2013-05-13 MED ORDER — PREDNISONE (PAK) 10 MG PO TABS: ORAL_TABLET | ORAL | Status: DC

## 2013-05-13 MED ORDER — PROMETHAZINE HCL 25 MG/ML IJ SOLN
25.0000 mg | Freq: Once | INTRAMUSCULAR | Status: AC
  Administered 2013-05-13: 25 mg via INTRAMUSCULAR

## 2013-05-13 NOTE — Progress Notes (Signed)
Pre-visit discussion using our clinic review tool. No additional management support is needed unless otherwise documented below in the visit note.  

## 2013-05-13 NOTE — Patient Instructions (Addendum)
You had the pain shot today, and the steroid shot You are given the [facility] sample Please take all new medication as prescribed - the antibiotic, cough pills (tessalon perle), and prednisone Please continue all other medications as before, and refills have been done if requested - the albuterol nebs, and the hydrocodone Please make appt in 3 months for chronic pain management if needed with Dr Felicity Coyer  Please remember to sign up for My Chart if you have not done so, as this will be important to you in the future with finding out test results, communicating by private email, and scheduling acute appointments online when needed.

## 2013-05-13 NOTE — Telephone Encounter (Signed)
If patient feels able to wait until tomorrow, we can work her in as acute for evaluation. Otherwise, I suggest evaluation today by my partners with availability or the urgent care

## 2013-05-13 NOTE — Progress Notes (Signed)
Subjective:    Patient ID: Gloria [name], female    DOB: 01-16-1951, 62 y.o.   MRN: [id number]  HPI   Here with acute onset mild to mod 2-3 days ST, HA, general weakness and malaise, with prod cough greenish sputum, but Pt denies chest pain, increased sob or doe, wheezing, orthopnea, PND, increased LE swelling, palpitations, dizziness or syncope, except for onset mild wheezing/sob since last PM.  Out of dulera, hard to afford, asks for sample.  Also with onset migraine this am mod to severe, left sided throbbing with mild photophobia.   Past Medical History  Diagnosis Date  . Depressive disorder, not elsewhere classified   . Other and unspecified hyperlipidemia   . Unspecified essential hypertension   . Migraine, unspecified, without mention of intractable migraine without mention of status migrainosus   . Unspecified urinary incontinence   . Arthritis   . Chronic bronchitis   . Anxiety state, unspecified   . Postmenopausal symptoms   . Panic attacks   . Allergic rhinitis, cause unspecified 03/30/2013   Past Surgical History  Procedure Laterality Date  . Abdominal hysterectomy  2002  . Dislocated shoulder      right  . Diagnostic laparoscopy    . Tumor right ankle      reports that she has never smoked. She has never used smokeless tobacco. She reports that she does not drink alcohol or use illicit drugs. family history includes Arthritis in an other family member; Colon cancer in her maternal grandfather; Hypertension in an other family member. Allergies  Allergen Reactions  . Lisinopril     cough  . Codeine Itching  . Compazine     Stroke like symptoms  . Haloperidol Decanoate Other (See Comments)    Extrapyramidal syndrome  . Penicillins Nausea And Vomiting   Current Outpatient Prescriptions on File Prior to Visit  Medication Sig Dispense Refill  . ALPRAZolam (XANAX) 1 MG tablet Take 1 tablet (1 mg total) by mouth 3 (three) times daily as needed for sleep or anxiety. Do not  fill more than once every 30 days  90 tablet  5  . CALCIUM-MAGNESIUM-ZINC PO Take 1 capsule by mouth daily.       . cetirizine (ZYRTEC) 10 MG tablet Take 1 tablet (10 mg total) by mouth daily.  30 tablet  11  . Cyanocobalamin (VITAMIN B 12 PO) Take 1 capsule by mouth daily.        Marland Kitchen ibuprofen (ADVIL,MOTRIN) 600 MG tablet Take 1 tablet (600 mg total) by mouth every 8 (eight) hours as needed for pain.  60 tablet  1  . losartan-hydrochlorothiazide (HYZAAR) 50-12.5 MG per tablet Take 1 tablet by mouth daily.  90 tablet  3  . mometasone-formoterol (DULERA) 200-5 MCG/ACT AERO Inhale 2 puffs into the lungs 2 (two) times daily.  3 Inhaler  0  . Multiple Vitamins-Minerals (CENTRUM SILVER PO) Take 1 capsule by mouth daily.        . Omega-3 Fatty Acids (FISH OIL) 1200 MG CAPS Take 1 capsule by mouth daily.        . promethazine (PHENERGAN) 12.5 MG tablet 1-2 pills every 4 hr as needed for nausea.  50 tablet  1  . sertraline (ZOLOFT) 100 MG tablet Take 1 tablet (100 mg total) by mouth daily.  30 tablet  3  . cyclobenzaprine (FLEXERIL) 10 MG tablet Take 1 tablet (10 mg total) by mouth 3 (three) times daily as needed for muscle spasms.  30 tablet  0  .  cyclobenzaprine (FLEXERIL) 5 MG tablet Take 1 tablet (5 mg total) by mouth every 8 (eight) hours as needed for muscle spasms.  30 tablet  1  . loratadine (CLARITIN) 10 MG tablet Take 1 tablet (10 mg total) by mouth daily.  30 tablet  11   No current facility-administered medications on file prior to visit.   Review of Systems  Constitutional: Negative for unexpected weight change, or unusual diaphoresis  HENT: Negative for tinnitus.   Eyes: Negative for photophobia and visual disturbance.  Respiratory: Negative for choking and stridor.   Gastrointestinal: Negative for vomiting and blood in stool.  Genitourinary: Negative for hematuria and decreased urine volume.  Musculoskeletal: Negative for acute joint swelling Skin: Negative for color change and wound.   Neurological: Negative for tremors and numbness other than noted  Psychiatric/Behavioral: Negative for decreased concentration or  hyperactivity.       Objective:   Physical Exam BP 132/80  Pulse 115  Temp(Src) 98.2 F (36.8 C) (Oral)  Ht 5\' 7"  (1.702 m)  Wt 222 lb 2 oz (100.755 kg)  BMI 34.78 kg/m2  SpO2 97% VS noted, mild ill Constitutional: Pt appears well-developed and well-nourished.  HENT: Head: NCAT.  Right Ear: External ear normal.  Left Ear: External ear normal.  Bilat tm's with mild erythema.  Max sinus areas mild tender.  Pharynx with mild erythema, no exudate Eyes: Conjunctivae and EOM are normal. Pupils are equal, round, and reactive to light.  Neck: Normal range of motion. Neck supple.  Cardiovascular: Normal rate and regular rhythm.   Pulmonary/Chest: Effort normal and breath sounds normal.  Abd:  Soft, NT, non-distended, + BS Neurological: Pt is alert. Not confused, cn 2-12 intact, motor 5/5  Skin: Skin is warm. No erythema.  Psychiatric: Pt behavior is normal. Thought content normal.     Assessment & Plan:

## 2013-05-13 NOTE — Telephone Encounter (Signed)
Patient Information:  Caller Name: [place]  Phone: [phone]  Patient: Gloria [name], Gloria [name]  Gender: Female  DOB: Oct 28, 1950  Age: 62 Years  PCP: Rene Paci (Adults only)  Office Follow Up:  Does the office need to follow up with this patient?: Yes  Instructions For The Office: ER disposition - pt refuses ER and requests appt with Dr Alberteen Sam ONLY (doesn't want to be bounced between Dr's).  No appts available.  Please follow up with pt with further instruction.   Symptoms  Reason For Call & Symptoms: Worsening asthma sxs over past two weeks.  Coughing up brown secretions.  Tight breathing, wheezing audible over phone, shob at rest, unable to rest. Nebs at 0130 and 0600.  Advised pt to do neb tx now and that she needs to be seen in ER.  Pt refuses to be seen in ER. Requesting appt with Dr Felicity Coyer only - no appt's available.  Reviewed Health History In EMR: Yes  Reviewed Medications In EMR: Yes  Reviewed Allergies In EMR: Yes  Reviewed Surgeries / Procedures: Yes  Date of Onset of Symptoms: 04/29/2013  Guideline(s) Used:  Asthma Attack  Disposition Per Guideline:   Go to ED Now  Reason For Disposition Reached:   Severe asthma attack (e.g., very SOB at rest, speaks in single words, loud wheezes)  Advice Given:  Quick-Relief Asthma Medicine:   Start your quick-relief medicine (e.g., albuterol, salbutamol) at the first sign of any coughing or shortness of breath (don't wait for wheezing). Use your inhaler (2 puffs each time) or nebulizer every 4 hours. Continue the quick-relief medicine until you have not wheezed or coughed for 48 hours.  Drinking Liquids:  Try to drink normal amount of liquids (e.g., water). Being adequately hydrated makes it easier to cough up the sticky lung mucus.  Patient Refused Recommendation:  Patient Refused Care Advice  Pt refuses ER, requests appt with her Dr only.

## 2013-05-13 NOTE — Telephone Encounter (Signed)
Pt scheduled appt with Dr Jonny Ruiz 11.13.14 at 3pm

## 2013-05-16 NOTE — Assessment & Plan Note (Signed)
Mild to mod, for antibx course,  to f/u any worsening symptoms or concerns 

## 2013-05-16 NOTE — Assessment & Plan Note (Signed)
stable overall by history and exam, recent data reviewed with pt, and pt to continue medical treatment as before,  to f/u any worsening symptoms or concerns BP Readings from Last 3 Encounters:  05/13/13 132/80  04/01/13 140/82  03/30/13 130/88

## 2013-05-16 NOTE — Assessment & Plan Note (Signed)
For toradol IM ,  to f/u any worsening symptoms or concerns  

## 2013-05-16 NOTE — Assessment & Plan Note (Signed)
Mild to mod, for depomedrol IM, dulera sample, predpack asd,  to f/u any worsening symptoms or concerns

## 2013-07-14 ENCOUNTER — Encounter: Payer: Self-pay | Admitting: Internal Medicine

## 2013-07-14 ENCOUNTER — Ambulatory Visit (INDEPENDENT_AMBULATORY_CARE_PROVIDER_SITE_OTHER): Payer: No Typology Code available for payment source | Admitting: Internal Medicine

## 2013-07-14 ENCOUNTER — Ambulatory Visit: Payer: No Typology Code available for payment source | Admitting: Internal Medicine

## 2013-07-14 VITALS — BP 130/80 | HR 105 | Temp 98.4°F | Ht 67.0 in | Wt 215.2 lb

## 2013-07-14 DIAGNOSIS — F411 Generalized anxiety disorder: Secondary | ICD-10-CM

## 2013-07-14 DIAGNOSIS — G43909 Migraine, unspecified, not intractable, without status migrainosus: Secondary | ICD-10-CM

## 2013-07-14 DIAGNOSIS — J45901 Unspecified asthma with (acute) exacerbation: Secondary | ICD-10-CM

## 2013-07-14 DIAGNOSIS — J069 Acute upper respiratory infection, unspecified: Secondary | ICD-10-CM | POA: Insufficient documentation

## 2013-07-14 MED ORDER — MEPERIDINE HCL 50 MG/ML IJ SOLN
25.0000 mg | Freq: Once | INTRAMUSCULAR | Status: AC
Start: 2013-07-14 — End: 2013-07-14
  Administered 2013-07-14: 25 mg via INTRAMUSCULAR

## 2013-07-14 MED ORDER — MOMETASONE FURO-FORMOTEROL FUM 200-5 MCG/ACT IN AERO: 2.0000 | INHALATION_SPRAY | Freq: Two times a day (BID) | RESPIRATORY_TRACT | Status: DC

## 2013-07-14 MED ORDER — PROMETHAZINE HCL 25 MG/ML IJ SOLN
25.0000 mg | Freq: Once | INTRAMUSCULAR | Status: AC
  Administered 2013-07-14: 25 mg via INTRAMUSCULAR

## 2013-07-14 MED ORDER — MEPERIDINE HCL 50 MG/ML IJ SOLN
50.0000 mg | Freq: Once | INTRAMUSCULAR | Status: AC
  Administered 2013-07-14: 50 mg via INTRAMUSCULAR

## 2013-07-14 NOTE — Assessment & Plan Note (Signed)
At least mild to mod, cont zoloft, needs to f/u with PCP regarding further xanax use

## 2013-07-14 NOTE — Assessment & Plan Note (Signed)
Mild, likely viral, for mucinex dm,  to f/u any worsening symptoms or concerns

## 2013-07-14 NOTE — Assessment & Plan Note (Signed)
Pt not realizing this is 4th visit since late sept with me for this purpose; ok for demerol/phenergan but this is not a long term strategy, needs f/u with PCP or HA clinic, but pt states unable to afford referral

## 2013-07-14 NOTE — Patient Instructions (Addendum)
You had the pain shot today (demerol 75, phenergan 25 mg) for the migraine  You can also take Delsym OTC for cough, and/or Mucinex (or it's generic off brand) for congestion, and tylenol as needed for pain.  Please take all new medication as prescribed  - the dulera sample as 2 puffs twice per day (and you are given some instructions with this today, as well as the coupon for another free inhaler with your prescription)  Please see Dr Asa Lente in 4 weeks (the office will call you)

## 2013-07-14 NOTE — Progress Notes (Signed)
Subjective:    Patient ID: Gloria [name], female    DOB: 1951-03-08, 63 y.o.   MRN: [id number]  HPI   Here with 2-3 days acute onset fever, facial pain, pressure, general weakness and malaise, and greenish d/c, with mild ST and cough, but pt denies chest pain, wheezing, increased sob or doe, orthopnea, PND, increased LE swelling, palpitations, dizziness or syncope. Also with onset this am of severe bifrontal HA, dull, throbbing, with photophobia and nausea.  Also c/o recurring wheezing intermittent mild for several months, has been meaning to see her PCP Dr Asa Lente for this but difficult to schedule time. Has dulera on her med list, but not currently using, has been using albuterol MDI mult times per wk, more in last few days.  Adamant she does not want oral or parenteral steroid today. Denies worsening depressive symptoms, suicidal ideation, or panic; has ongoing anxiety, asks for xanax refill. Past Medical History  Diagnosis Date  . Depressive disorder, not elsewhere classified   . Other and unspecified hyperlipidemia   . Unspecified essential hypertension   . Migraine, unspecified, without mention of intractable migraine without mention of status migrainosus   . Unspecified urinary incontinence   . Arthritis   . Chronic bronchitis   . Anxiety state, unspecified   . Postmenopausal symptoms   . Panic attacks   . Allergic rhinitis, cause unspecified 03/30/2013   Past Surgical History  Procedure Laterality Date  . Abdominal hysterectomy  2002  . Dislocated shoulder      right  . Diagnostic laparoscopy    . Tumor right ankle      reports that she has never smoked. She has never used smokeless tobacco. She reports that she does not drink alcohol or use illicit drugs. family history includes Arthritis in an other family member; Colon cancer in her maternal grandfather; Hypertension in an other family member. Allergies  Allergen Reactions  . Lisinopril     cough  . Codeine Itching  .  Compazine     Stroke like symptoms  . Haloperidol Decanoate Other (See Comments)    Extrapyramidal syndrome  . Penicillins Nausea And Vomiting   Current Outpatient Prescriptions on File Prior to Visit  Medication Sig Dispense Refill  . albuterol (PROVENTIL) (2.5 MG/3ML) 0.083% nebulizer solution Take 3 mLs (2.5 mg total) by nebulization every 4 (four) hours as needed for wheezing.  25 vial  2  . ALPRAZolam (XANAX) 1 MG tablet Take 1 tablet (1 mg total) by mouth 3 (three) times daily as needed for sleep or anxiety. Do not fill more than once every 30 days  90 tablet  5  . CALCIUM-MAGNESIUM-ZINC PO Take 1 capsule by mouth daily.       . cetirizine (ZYRTEC) 10 MG tablet Take 1 tablet (10 mg total) by mouth daily.  30 tablet  11  . Cyanocobalamin (VITAMIN B 12 PO) Take 1 capsule by mouth daily.        Marland Kitchen HYDROcodone-acetaminophen (NORCO) 5-325 MG per tablet Take 1 tablet by mouth every 8 (eight) hours as needed. Do not fill more than once every 30 days - to fill Jul 12, 2013  40 tablet  0  . ibuprofen (ADVIL,MOTRIN) 600 MG tablet Take 1 tablet (600 mg total) by mouth every 8 (eight) hours as needed for pain.  60 tablet  1  . losartan-hydrochlorothiazide (HYZAAR) 50-12.5 MG per tablet Take 1 tablet by mouth daily.  90 tablet  3  . Multiple Vitamins-Minerals (CENTRUM SILVER PO)  Take 1 capsule by mouth daily.        . Omega-3 Fatty Acids (FISH OIL) 1200 MG CAPS Take 1 capsule by mouth daily.        . promethazine (PHENERGAN) 12.5 MG tablet 1-2 pills every 4 hr as needed for nausea.  50 tablet  1  . sertraline (ZOLOFT) 100 MG tablet Take 1 tablet (100 mg total) by mouth daily.  30 tablet  3  . benzonatate (TESSALON) 100 MG capsule 1-2 tabs by mouth every 6 hrs as needed for cough  90 capsule  1  . cyclobenzaprine (FLEXERIL) 10 MG tablet Take 1 tablet (10 mg total) by mouth 3 (three) times daily as needed for muscle spasms.  30 tablet  0  . cyclobenzaprine (FLEXERIL) 5 MG tablet Take 1 tablet (5 mg  total) by mouth every 8 (eight) hours as needed for muscle spasms.  30 tablet  1  . loratadine (CLARITIN) 10 MG tablet Take 1 tablet (10 mg total) by mouth daily.  30 tablet  11  . predniSONE (STERAPRED UNI-PAK) 10 MG tablet Take by mouth as directed. As directed x 6 days  21 tablet  0   No current facility-administered medications on file prior to visit.   Review of Systems  Constitutional: Negative for unexpected weight change, or unusual diaphoresis  HENT: Negative for tinnitus.   Eyes: Negative for photophobia and visual disturbance.  Respiratory: Negative for choking and stridor.   Gastrointestinal: Negative for vomiting and blood in stool.  Genitourinary: Negative for hematuria and decreased urine volume.  Musculoskeletal: Negative for acute joint swelling Skin: Negative for color change and wound.  Neurological: Negative for tremors and numbness other than noted  Psychiatric/Behavioral: Negative for decreased concentration or  hyperactivity.       Objective:   Physical Exam BP 130/80  Pulse 105  Temp(Src) 98.4 F (36.9 C) (Oral)  Ht 5\' 7"  (1.702 m)  Wt 215 lb 4 oz (97.637 kg)  BMI 33.71 kg/m2  SpO2 97% VS noted, mild ill Constitutional: Pt appears well-developed and well-nourished.  HENT: Head: NCAT.  Right Ear: External ear normal.  Left Ear: External ear normal.  Bilat tm's with mild erythema.  Max sinus areas non tender.  Pharynx with mild erythema, no exudate Eyes: Conjunctivae and EOM are normal. Pupils are equal, round, and reactive to light.  Neck: Normal range of motion. Neck supple.  Cardiovascular: Normal rate and regular rhythm.   Pulmonary/Chest: Effort normal and breath sounds decr with few bilat wheezes.  Abd:  Soft, NT, non-distended, + BS Neurological: Pt is alert. Not confused , cn 2-12 intact, motor 5/5, gait intact Skin: Skin is warm. No erythema.  Psychiatric: Pt behavior is normal. Thought content normal. 1-2+ nervous    Assessment & Plan:

## 2013-07-14 NOTE — Progress Notes (Signed)
Pre-visit discussion using our clinic review tool. No additional management support is needed unless otherwise documented below in the visit note.  

## 2013-07-14 NOTE — Assessment & Plan Note (Signed)
Mild, gave sample dulera at 2 puffs twice per day, with rx and coupon for 1 free inhaler as well, with several refills after that

## 2013-07-15 ENCOUNTER — Telehealth: Payer: Self-pay | Admitting: Internal Medicine

## 2013-07-15 NOTE — Telephone Encounter (Signed)
Message copied by Sherral Hammers on Thu Jul 15, 2013  9:13 AM ------      Message from: Biagio Borg      Created: Wed Jul 14, 2013  6:28 PM      Regarding: ROV to Dr Asa Lente       Needs ROV in 4 wks to Dr Asa Lente please ------

## 2013-07-15 NOTE — Telephone Encounter (Signed)
Pt is aware.  States she will call back when she has her schedule.

## 2013-07-16 ENCOUNTER — Ambulatory Visit: Payer: No Typology Code available for payment source | Admitting: Internal Medicine

## 2013-07-26 ENCOUNTER — Encounter: Payer: Self-pay | Admitting: Internal Medicine

## 2013-07-26 ENCOUNTER — Ambulatory Visit (INDEPENDENT_AMBULATORY_CARE_PROVIDER_SITE_OTHER): Payer: No Typology Code available for payment source | Admitting: Internal Medicine

## 2013-07-26 ENCOUNTER — Ambulatory Visit (INDEPENDENT_AMBULATORY_CARE_PROVIDER_SITE_OTHER): Payer: No Typology Code available for payment source

## 2013-07-26 VITALS — BP 122/82 | HR 98 | Temp 98.6°F | Wt 219.8 lb

## 2013-07-26 DIAGNOSIS — F411 Generalized anxiety disorder: Secondary | ICD-10-CM

## 2013-07-26 DIAGNOSIS — Z0000 Encounter for general adult medical examination without abnormal findings: Secondary | ICD-10-CM

## 2013-07-26 DIAGNOSIS — R7309 Other abnormal glucose: Secondary | ICD-10-CM

## 2013-07-26 DIAGNOSIS — R739 Hyperglycemia, unspecified: Secondary | ICD-10-CM

## 2013-07-26 DIAGNOSIS — J45901 Unspecified asthma with (acute) exacerbation: Secondary | ICD-10-CM

## 2013-07-26 DIAGNOSIS — Z1239 Encounter for other screening for malignant neoplasm of breast: Secondary | ICD-10-CM

## 2013-07-26 LAB — HEPATIC FUNCTION PANEL
ALT: 17 U/L (ref 0–35)
AST: 15 U/L (ref 0–37)
Albumin: 3.8 g/dL (ref 3.5–5.2)
Alkaline Phosphatase: 110 U/L (ref 39–117)
Bilirubin, Direct: 0 mg/dL (ref 0.0–0.3)
Total Bilirubin: 0.7 mg/dL (ref 0.3–1.2)
Total Protein: 8.5 g/dL — ABNORMAL HIGH (ref 6.0–8.3)

## 2013-07-26 LAB — CBC WITH DIFFERENTIAL/PLATELET
Basophils Absolute: 0 10*3/uL (ref 0.0–0.1)
Basophils Relative: 0.3 % (ref 0.0–3.0)
Eosinophils Absolute: 0.4 10*3/uL (ref 0.0–0.7)
Eosinophils Relative: 3 % (ref 0.0–5.0)
HCT: 36.3 % (ref 36.0–46.0)
Hemoglobin: 11.8 g/dL — ABNORMAL LOW (ref 12.0–15.0)
Lymphocytes Relative: 16.2 % (ref 12.0–46.0)
Lymphs Abs: 2.3 10*3/uL (ref 0.7–4.0)
MCHC: 32.5 g/dL (ref 30.0–36.0)
MCV: 85.8 fl (ref 78.0–100.0)
Monocytes Absolute: 0.5 10*3/uL (ref 0.1–1.0)
Monocytes Relative: 3.6 % (ref 3.0–12.0)
Neutro Abs: 11 10*3/uL — ABNORMAL HIGH (ref 1.4–7.7)
Neutrophils Relative %: 76.9 % (ref 43.0–77.0)
Platelets: 369 10*3/uL (ref 150.0–400.0)
RBC: 4.24 Mil/uL (ref 3.87–5.11)
RDW: 14.4 % (ref 11.5–14.6)
WBC: 14.3 10*3/uL — ABNORMAL HIGH (ref 4.5–10.5)

## 2013-07-26 LAB — BASIC METABOLIC PANEL
BUN: 12 mg/dL (ref 6–23)
CO2: 30 mEq/L (ref 19–32)
Calcium: 9.3 mg/dL (ref 8.4–10.5)
Chloride: 101 mEq/L (ref 96–112)
Creatinine, Ser: 0.7 mg/dL (ref 0.4–1.2)
GFR: 103.52 mL/min (ref >60.00)
Glucose, Bld: 94 mg/dL (ref 70–99)
Potassium: 3.6 mEq/L (ref 3.5–5.1)
Sodium: 139 mEq/L (ref 135–145)

## 2013-07-26 LAB — LIPID PANEL
Cholesterol: 141 mg/dL (ref 0–200)
HDL: 34.1 mg/dL — ABNORMAL LOW (ref >39.00)
Total CHOL/HDL Ratio: 4
Triglycerides: 322 mg/dL — ABNORMAL HIGH (ref 0.0–149.0)
VLDL: 64.4 mg/dL — ABNORMAL HIGH (ref 0.0–40.0)

## 2013-07-26 LAB — HEMOGLOBIN A1C: Hgb A1c MFr Bld: 7 % — ABNORMAL HIGH (ref 4.6–6.5)

## 2013-07-26 MED ORDER — FLUTICASONE PROPIONATE 50 MCG/ACT NA SUSP: 1.0000 | Freq: Every day | NASAL | Status: DC

## 2013-07-26 MED ORDER — ALPRAZOLAM 1 MG PO TABS
1.0000 mg | ORAL_TABLET | Freq: Two times a day (BID) | ORAL | Status: DC | PRN
Start: 2013-07-26 — End: 2014-10-07

## 2013-07-26 MED ORDER — DULOXETINE HCL 30 MG PO CPEP: 30.0000 mg | ORAL_CAPSULE | Freq: Every day | ORAL | Status: DC

## 2013-07-26 NOTE — Assessment & Plan Note (Signed)
Ongoing low grade symptoms for several months - Reviewed importance of compliance with LABA Ruthe Mannan) as rx'd in addition to albuterol as needed Refer to pulmonary specialist Maximize control of allergies with daily antihistamine and Flonase

## 2013-07-26 NOTE — Progress Notes (Signed)
Pre-visit discussion using our clinic review tool. No additional management support is needed unless otherwise documented below in the visit note.  

## 2013-07-26 NOTE — Patient Instructions (Addendum)
It was good to see you today.  We have reviewed your prior records including labs and tests today  Health Maintenance reviewed - refer for mammogram screening - all other recommended immunizations and age-appropriate screenings are up-to-date.  Test(s) ordered today. Your results will be released to [place] (or called to you) after review, usually within 72hours after test completion. If any changes need to be made, you will be notified at that same time.  Medications reviewed and updated Start Cymbalta 30 mg once daily for anxiety and depression symptoms Will also decrease supply of Xanax to maximum 2 tablets daily  Refill on medication(s) as discussed today.  we'll make referral to psychologist for counseling on your depression and anxiety symptoms. Also pulmonary specialist for your asthma symptoms . Our office will contact you regarding appointment(s) once made.  Please schedule followup in 3 months for annual exam and to review labs, call sooner if problems.  Health Maintenance, Female A healthy lifestyle and preventative care can promote health and wellness.  Maintain regular health, dental, and eye exams.  Eat a healthy diet. Foods like vegetables, fruits, whole grains, low-fat dairy products, and lean protein foods contain the nutrients you need without too many calories. Decrease your intake of foods high in solid fats, added sugars, and salt. Get information about a proper diet from your caregiver, if necessary.  Regular physical exercise is one of the most important things you can do for your health. Most adults should get at least 150 minutes of moderate-intensity exercise (any activity that increases your heart rate and causes you to sweat) each week. In addition, most adults need muscle-strengthening exercises on 2 or more days a week.   Maintain a healthy weight. The body mass index (BMI) is a screening tool to identify possible weight problems. It provides an estimate of  body fat based on height and weight. Your caregiver can help determine your BMI, and can help you achieve or maintain a healthy weight. For adults 20 years and older:  A BMI below 18.5 is considered underweight.  A BMI of 18.5 to 24.9 is normal.  A BMI of 25 to 29.9 is considered overweight.  A BMI of 30 and above is considered obese.  Maintain normal blood lipids and cholesterol by exercising and minimizing your intake of saturated fat. Eat a balanced diet with plenty of fruits and vegetables. Blood tests for lipids and cholesterol should begin at age 75 and be repeated every 5 years. If your lipid or cholesterol levels are high, you are over 50, or you are a high risk for heart disease, you may need your cholesterol levels checked more frequently.Ongoing high lipid and cholesterol levels should be treated with medicines if diet and exercise are not effective.  If you smoke, find out from your caregiver how to quit. If you do not use tobacco, do not start.  Lung cancer screening is recommended for adults aged 35 80 years who are at high risk for developing lung cancer because of a history of smoking. Yearly low-dose computed tomography (CT) is recommended for people who have at least a 30-pack-year history of smoking and are a current smoker or have quit within the past 15 years. A pack year of smoking is smoking an average of 1 pack of cigarettes a day for 1 year (for example: 1 pack a day for 30 years or 2 packs a day for 15 years). Yearly screening should continue until the smoker has stopped smoking for at least  15 years. Yearly screening should also be stopped for people who develop a health problem that would prevent them from having lung cancer treatment.  If you are pregnant, do not drink alcohol. If you are breastfeeding, be very cautious about drinking alcohol. If you are not pregnant and choose to drink alcohol, do not exceed 1 drink per day. One drink is considered to be 12 ounces (355  mL) of beer, 5 ounces (148 mL) of wine, or 1.5 ounces (44 mL) of liquor.  Avoid use of street drugs. Do not share needles with anyone. Ask for help if you need support or instructions about stopping the use of drugs.  High blood pressure causes heart disease and increases the risk of stroke. Blood pressure should be checked at least every 1 to 2 years. Ongoing high blood pressure should be treated with medicines, if weight loss and exercise are not effective.  If you are 22 to 64 years old, ask your caregiver if you should take aspirin to prevent strokes.  Diabetes screening involves taking a blood sample to check your fasting blood sugar level. This should be done once every 3 years, after age 74, if you are within normal weight and without risk factors for diabetes. Testing should be considered at a younger age or be carried out more frequently if you are overweight and have at least 1 risk factor for diabetes.  Breast cancer screening is essential preventative care for women. You should practice "breast self-awareness." This means understanding the normal appearance and feel of your breasts and may include breast self-examination. Any changes detected, no matter how small, should be reported to a caregiver. Women in their 22s and 30s should have a clinical breast exam (CBE) by a caregiver as part of a regular health exam every 1 to 3 years. After age 30, women should have a CBE every year. Starting at age 46, women should consider having a mammogram (breast X-ray) every year. Women who have a family history of breast cancer should talk to their caregiver about genetic screening. Women at a high risk of breast cancer should talk to their caregiver about having an MRI and a mammogram every year.  Breast cancer gene (BRCA)-related cancer risk assessment is recommended for women who have family members with BRCA-related cancers. BRCA-related cancers include breast, ovarian, tubal, and peritoneal cancers.  Having family members with these cancers may be associated with an increased risk for harmful changes (mutations) in the breast cancer genes BRCA1 and BRCA2. Results of the assessment will determine the need for genetic counseling and BRCA1 and BRCA2 testing.  The Pap test is a screening test for cervical cancer. Women should have a Pap test starting at age 32. Between ages 5 and 11, Pap tests should be repeated every 2 years. Beginning at age 33, you should have a Pap test every 3 years as long as the past 3 Pap tests have been normal. If you had a hysterectomy for a problem that was not cancer or a condition that could lead to cancer, then you no longer need Pap tests. If you are between ages 19 and 64, and you have had normal Pap tests going back 10 years, you no longer need Pap tests. If you have had past treatment for cervical cancer or a condition that could lead to cancer, you need Pap tests and screening for cancer for at least 20 years after your treatment. If Pap tests have been discontinued, risk factors (such as a new  sexual partner) need to be reassessed to determine if screening should be resumed. Some women have medical problems that increase the chance of getting cervical cancer. In these cases, your caregiver may recommend more frequent screening and Pap tests.  The human papillomavirus (HPV) test is an additional test that may be used for cervical cancer screening. The HPV test looks for the virus that can cause the cell changes on the cervix. The cells collected during the Pap test can be tested for HPV. The HPV test could be used to screen women aged 69 years and older, and should be used in women of any age who have unclear Pap test results. After the age of 23, women should have HPV testing at the same frequency as a Pap test.  Colorectal cancer can be detected and often prevented. Most routine colorectal cancer screening begins at the age of 106 and continues through age 57. However,  your caregiver may recommend screening at an earlier age if you have risk factors for colon cancer. On a yearly basis, your caregiver may provide home test kits to check for hidden blood in the stool. Use of a small camera at the end of a tube, to directly examine the colon (sigmoidoscopy or colonoscopy), can detect the earliest forms of colorectal cancer. Talk to your caregiver about this at age 33, when routine screening begins. Direct examination of the colon should be repeated every 5 to 10 years through age 25, unless early forms of pre-cancerous polyps or small growths are found.  Hepatitis C blood testing is recommended for all people born from [birth year] through 1966 and any individual with known risks for hepatitis C.  Practice safe sex. Use condoms and avoid high-risk sexual practices to reduce the spread of sexually transmitted infections (STIs). Sexually active women aged 90 and younger should be checked for Chlamydia, which is a common sexually transmitted infection. Older women with new or multiple partners should also be tested for Chlamydia. Testing for other STIs is recommended if you are sexually active and at increased risk.  Osteoporosis is a disease in which the bones lose minerals and strength with aging. This can result in serious bone fractures. The risk of osteoporosis can be identified using a bone density scan. Women ages 46 and over and women at risk for fractures or osteoporosis should discuss screening with their caregivers. Ask your caregiver whether you should be taking a calcium supplement or vitamin D to reduce the rate of osteoporosis.  Menopause can be associated with physical symptoms and risks. Hormone replacement therapy is available to decrease symptoms and risks. You should talk to your caregiver about whether hormone replacement therapy is right for you.  Use sunscreen. Apply sunscreen liberally and repeatedly throughout the day. You should seek shade when your shadow is  shorter than you. Protect yourself by wearing long sleeves, pants, a wide-brimmed hat, and sunglasses year round, whenever you are outdoors.  Notify your caregiver of new moles or changes in moles, especially if there is a change in shape or color. Also notify your caregiver if a mole is larger than the size of a pencil eraser.  Stay current with your immunizations. Document Released: 12/31/2010 Document Revised: 10/12/2012 Document Reviewed: 12/31/2010 [facility] Patient Information 2014 [place]. Generalized Anxiety Disorder Generalized anxiety disorder (GAD) is a mental disorder. It interferes with life functions, including relationships, work, and school. GAD is different from normal anxiety, which everyone experiences at some point in their lives in response to specific  life events and activities. Normal anxiety actually helps [place] prepare for and get through these life events and activities. Normal anxiety goes away after the event or activity is over.  GAD causes anxiety that is not necessarily related to specific events or activities. It also causes excess anxiety in proportion to specific events or activities. The anxiety associated with GAD is also difficult to control. GAD can vary from mild to severe. People with severe GAD can have intense waves of anxiety with physical symptoms (panic attacks).  SYMPTOMS The anxiety and worry associated with GAD are difficult to control. This anxiety and worry are related to many life events and activities and also occur more days than not for 6 months or longer. People with GAD also have three or more of the following symptoms (one or more in children):  Restlessness.   Fatigue.  Difficulty concentrating.   Irritability.  Muscle tension.  Difficulty sleeping or unsatisfying sleep. DIAGNOSIS GAD is diagnosed through an assessment by your caregiver. Your caregiver will ask you questions aboutyour mood,physical symptoms, and events in  your life. Your caregiver may ask you about your medical history and use of alcohol or drugs, including prescription medications. Your caregiver may also do a physical exam and blood tests. Certain medical conditions and the use of certain substances can cause symptoms similar to those associated with GAD. Your caregiver may refer you to a mental health specialist for further evaluation. TREATMENT The following therapies are usually used to treat GAD:   Medication Antidepressant medication usually is prescribed for long-term daily control. Antianxiety medications may be added in severe cases, especially when panic attacks occur.   Talk therapy (psychotherapy) Certain types of talk therapy can be helpful in treating GAD by providing support, education, and guidance. A form of talk therapy called cognitive behavioral therapy can teach you healthy ways to think about and react to daily life events and activities.  Stress managementtechniques These include yoga, meditation, and exercise and can be very helpful when they are practiced regularly. A mental health specialist can help determine which treatment is best for you. Some people see improvement with one therapy. However, other people require a combination of therapies. Document Released: 10/12/2012 Document Reviewed: 10/12/2012 [facility] Patient Information 2014 [place], [place].

## 2013-07-26 NOTE — Assessment & Plan Note (Signed)
Intolerant of buspar - crisis with son getting DUI, housing issue previously intolerant of paxil and sertraline trials - ?adequate duration as pt reluctant to take same tried citalopram 03/2012: caused nausea Did not tolerate paxil 08/2012 or resumed sertraline 03/2013 Prior trials propanolol prn tremor and traz 50 prn insomnia also ineffective i declined to increase dose of BZ or change BZ - again explained risk of tolerance and evidence of same - reduce to BID as I will not rx solo BZ therapy Start cymbalta now 07/2013 - Your prescription(s) have been submitted to your pharmacy. Please take as directed and contact our office if you believe you are having problem(s) with the medication(s). Verified no SI/HI today Refer to [facility] and advised psyc care at [facility] if decline

## 2013-07-26 NOTE — Progress Notes (Signed)
Subjective:    Patient ID: Gloria [name], female    DOB: Dec 30, 1950, 63 y.o.   MRN: [id number]  HPI   Here for medicare wellness  Diet: heart healthy or DM if diabetic Physical activity: sedentary Depression/mood screen: negative Hearing: intact to whispered voice Visual acuity: grossly normal, performs annual eye exam  ADLs: capable Fall risk: none Home safety: good Cognitive evaluation: intact to orientation, naming, recall and repetition EOL planning: adv directives, full code/ I agree  I have personally reviewed and have noted 1. The patient's medical and social history 2. Their use of alcohol, tobacco or illicit drugs 3. Their current medications and supplements 4. The patient's functional ability including ADL's, fall risks, home safety risks and hearing or visual impairment. 5. Diet and physical activities 6. Evidence for depression or mood disorders  also reviewed chronic medical issue and interval medical events  complains of continued wheeze and cough, unimproved following OV here for same earlier this month  Past Medical History  Diagnosis Date  . Depressive disorder, not elsewhere classified   . Other and unspecified hyperlipidemia   . Unspecified essential hypertension   . Migraine, unspecified, without mention of intractable migraine without mention of status migrainosus   . Unspecified urinary incontinence   . Arthritis   . Chronic bronchitis   . Anxiety state, unspecified   . Postmenopausal symptoms   . Panic attacks   . Allergic rhinitis, cause unspecified 03/30/2013   Family History  Problem Relation Age of Onset  . Arthritis      family history  . Hypertension      grandparent  . Colon cancer Maternal Grandfather    History  Substance Use Topics  . Smoking status: Never Smoker   . Smokeless tobacco: Never Used  . Alcohol Use: No    Review of Systems  Constitutional: Negative for fatigue and unexpected weight change.  Respiratory:  Positive for cough and wheezing. Negative for shortness of breath.   Cardiovascular: Negative for chest pain, palpitations and leg swelling.  Gastrointestinal: Negative for nausea, abdominal pain and diarrhea.  Neurological: Negative for dizziness, weakness, light-headedness and headaches.  Psychiatric/Behavioral: Positive for sleep disturbance (chronic), dysphoric mood and decreased concentration. Negative for suicidal ideas and self-injury. The patient is nervous/anxious.   All other systems reviewed and are negative.       Objective:   Physical Exam BP 122/82  Pulse 98  Temp(Src) 98.6 F (37 C) (Oral)  Wt 219 lb 12.8 oz (99.701 kg)  SpO2 94% Wt Readings from Last 3 Encounters:  07/26/13 219 lb 12.8 oz (99.701 kg)  07/14/13 215 lb 4 oz (97.637 kg)  05/13/13 222 lb 2 oz (100.755 kg)   Constitutional: She is obese, but appears well-developed and well-nourished. Emotional but no physical distress.  Neck: Normal range of motion. Neck supple. No JVD present. No thyromegaly present.  Cardiovascular: Normal rate, regular rhythm and normal heart sounds.  No murmur heard. No BLE edema. Pulmonary/Chest: Effort normal -expiratory wheeze right upper lobe but no respiratory distress. She has no crackles Abdomen: SNTND+BS  Psychiatric: She has a tearful and anxious mood and affect. Her behavior is normal. Judgment and thought content normal.   Lab Results  Component Value Date   WBC 14.3* 12/05/2010   HGB 12.1 12/05/2010   HCT 36.2 12/05/2010   PLT 389.0 12/05/2010   GLUCOSE 73 12/05/2010   CHOL 176 12/05/2010   TRIG 274.0* 12/05/2010   HDL 48.00 12/05/2010   LDLDIRECT 96.3 12/05/2010  ALT 18 12/05/2010   AST 17 12/05/2010   NA 138 12/05/2010   K 4.2 12/05/2010   CL 99 12/05/2010   CREATININE 0.7 12/05/2010   BUN 17 12/05/2010   CO2 29 12/05/2010   TSH 2.149 02/28/2011   HGBA1C 5.9 05/12/2012       Assessment & Plan:   AWV/v70.0 - Today patient counseled on age appropriate routine health concerns for  screening and prevention, each reviewed and up to date or declined. Immunizations reviewed and up to date or declined. Labs/ECG reviewed. Risk factors for depression reviewed and negative. Hearing function and visual acuity are intact. ADLs screened and addressed as needed. Functional ability and level of safety reviewed and appropriate. Education, counseling and referrals performed based on assessed risks today. Patient provided with a copy of personalized plan for preventive services.  Hyperglycemia. Frequent use of steroids, declines repeat for same despite acute ongoing asthma symptoms. Strong family history of diabetes, will check A1c today along with other labs  Also See problem list. Medications and labs reviewed today.

## 2013-07-27 ENCOUNTER — Encounter: Payer: Self-pay | Admitting: Internal Medicine

## 2013-07-27 DIAGNOSIS — IMO0002 Reserved for concepts with insufficient information to code with codable children: Secondary | ICD-10-CM | POA: Insufficient documentation

## 2013-07-27 DIAGNOSIS — E1140 Type 2 diabetes mellitus with diabetic neuropathy, unspecified: Secondary | ICD-10-CM | POA: Insufficient documentation

## 2013-07-27 DIAGNOSIS — E1165 Type 2 diabetes mellitus with hyperglycemia: Secondary | ICD-10-CM

## 2013-07-27 LAB — TSH: TSH: 1.35 u[IU]/mL (ref 0.35–5.50)

## 2013-07-27 LAB — LDL CHOLESTEROL, DIRECT: Direct LDL: 65.8 mg/dL

## 2013-07-28 ENCOUNTER — Other Ambulatory Visit: Payer: Self-pay | Admitting: *Deleted

## 2013-07-28 NOTE — Telephone Encounter (Signed)
Received fax pt needing PA on her cymbalta. Notified insurance PA was completed and fax back on yesterday. Received PA back this am med was approved. Notified pt & pharmacy with status...Gloria [name]

## 2013-08-02 ENCOUNTER — Encounter: Payer: Self-pay | Admitting: Internal Medicine

## 2013-08-02 ENCOUNTER — Ambulatory Visit (INDEPENDENT_AMBULATORY_CARE_PROVIDER_SITE_OTHER): Payer: No Typology Code available for payment source | Admitting: Internal Medicine

## 2013-08-02 VITALS — BP 130/80 | HR 97 | Temp 98.8°F | Ht 67.0 in | Wt 219.6 lb

## 2013-08-02 DIAGNOSIS — J45909 Unspecified asthma, uncomplicated: Secondary | ICD-10-CM

## 2013-08-02 DIAGNOSIS — R0989 Other specified symptoms and signs involving the circulatory and respiratory systems: Secondary | ICD-10-CM

## 2013-08-02 DIAGNOSIS — R0600 Dyspnea, unspecified: Secondary | ICD-10-CM

## 2013-08-02 DIAGNOSIS — R0609 Other forms of dyspnea: Secondary | ICD-10-CM

## 2013-08-02 NOTE — Progress Notes (Signed)
   Subjective:    Patient ID: Gloria [name], female    DOB: 05-10-1951    MRN: [id number]  HPI  54 yobf sickly child (? Pna, whooping cough, asthma) never full aerobic tolerance then better ages 48- 21 and no daily rx or need to go doctor and self rx with otcs like nyquil then much  Worse since 10/19/2004 when mother died and since then freq prns including   albuterol neb worse since 2011-10-20 and needing daily dulera 200 (though admits not consistent with it) referred 08/02/2013 by Dr Basilio Cairo for cough.  08/02/2013 1st [facility] office visit/ Britt Petroni cc mid prod (always white mucus) cough every day esp in am x sev years better on prednisone but "gained too much wt" from baseline 180  - wakes up daily 3 am smothering / hacking > goes to [facility] and uses dulera and helps and props up head and goes back to bed and then wakes up s flare. Also neb maybe once a month. Worse with pollen exp x 2 years   No obvious patterns in  day to day or daytime variabilty or assoc   cp or chest tightness, subjective wheeze overt sinus or hb symptoms. No unusual exp hx  or knowledge of premature birth.  Sleeping ok without nocturnal  or early am exacerbation  of respiratory  c/o's or need for noct saba. Also denies any obvious fluctuation of symptoms with weather or environmental changes or other aggravating or alleviating factors except as outlined above   Current Medications, Allergies, Complete Past Medical History, Past Surgical History, Family History, and Social History were reviewed in [employer] record.   .       Review of Systems  Constitutional: Negative for fever, chills and unexpected weight change.  HENT: Negative for congestion, dental problem, ear pain, nosebleeds, postnasal drip, rhinorrhea, sinus pressure, sneezing, sore throat, trouble swallowing and voice change.   Eyes: Negative for visual disturbance.  Respiratory: Positive for cough. Negative for choking.   Cardiovascular: Negative for  chest pain and leg swelling.  Gastrointestinal: Negative for vomiting, abdominal pain and diarrhea.  Genitourinary: Negative for difficulty urinating.  Musculoskeletal: Positive for arthralgias.  Skin: Negative for rash.  Neurological: Negative for tremors, syncope and headaches.  Hematological: Does not bruise/bleed easily.       Objective:   Physical Exam  amb bf nad with prominent pseudowheeze completely resolves with purse lip maneuver   Wt Readings from Last 3 Encounters:  08/02/13 219 lb 9.6 oz (99.61 kg)  07/26/13 219 lb 12.8 oz (99.701 kg)  07/14/13 215 lb 4 oz (97.637 kg)      HEENT: nl dentition, turbinates, and orophanx. Nl external ear canals without cough reflex   NECK :  without JVD/Nodes/TM/ nl carotid upstrokes bilaterally   LUNGS: no acc muscle use, clear to A and P bilaterally without cough on insp or exp maneuvers   CV:  RRR  no s3 or murmur or increase in P2, no edema   ABD:  soft and nontender with nl excursion in the supine position. No bruits or organomegaly, bowel sounds nl  MS:  warm without deformities, calf tenderness, cyanosis or clubbing  SKIN: warm and dry without lesions    NEURO:  alert, approp, no deficits   cxr 04/15/12  No acute cardiopulmonary abnormalities.         Assessment & Plan:

## 2013-08-02 NOTE — Assessment & Plan Note (Addendum)
DDX of  difficult airways managment all start with A and  include Adherence, Ace Inhibitors, Acid Reflux, Active Sinus Disease, Alpha 1 Antitripsin deficiency, Anxiety masquerading as Airways dz,  ABPA,  allergy(esp in young), Aspiration (esp in elderly), Adverse effects of DPI,  Active smokers, plus two Bs  = Bronchiectasis and Beta blocker use..and one C= CHF  Adherence is always the initial "prime suspect" and is a multilayered concern that requires a "trust but verify" approach in every patient - starting with knowing how to use medications, especially inhalers, correctly, keeping up with refills and understanding the fundamental difference between maintenance and prns vs those medications only taken for a very short course and then stopped and not refilled.  - The proper method of use, as well as anticipated side effects, of a metered-dose inhaler are discussed and demonstrated to the patient. Improved effectiveness after extensive coaching during this visit to a level of approximately  75% so try dulera 200 2bid  ? Acid (or non-acid) GERD > always difficult to exclude as up to 75% of pts in some series report no assoc GI/ Heartburn symptoms> rec max (24h)  acid suppression and diet restrictions/ reviewed and instructions given in writing.   ? Anxiety > note onset with mother's death and resolution of pseudowheeze with purse lip maneuver    Each maintenance medication was reviewed in detail including most importantly the difference between maintenance and as needed and under what circumstances the prns are to be used.  Please see instructions for details which were reviewed in writing and the patient given a copy.

## 2013-08-02 NOTE — Patient Instructions (Signed)
Pepcid ac 20 mg one at bedtime until return.  GERD (REFLUX)  is an extremely common cause of respiratory symptoms, many times with no significant heartburn at all.    It can be treated with medication, but also with lifestyle changes including avoidance of late meals, excessive alcohol, smoking cessation, and avoid fatty foods, chocolate, peppermint, colas, red wine, and acidic juices such as orange juice.  NO MINT OR MENTHOL PRODUCTS SO NO COUGH DROPS  USE SUGARLESS CANDY INSTEAD (jolley ranchers or Stover's)  NO OIL BASED VITAMINS - use powdered substitutes.  Dulera 200 Take 2 puffs first thing in am and then another 2 puffs about 12 hours later.   Work on inhaler technique:  relax and gently blow all the way out then take a nice smooth deep breath back in, triggering the inhaler at same time you start breathing in.  Hold for up to 5 seconds if you can.  Rinse and gargle with water when done.        Please schedule a follow up office visit in 6 weeks, call sooner if needed with pfts on return

## 2013-08-04 ENCOUNTER — Telehealth: Payer: Self-pay | Admitting: *Deleted

## 2013-08-04 MED ORDER — VENLAFAXINE HCL ER 37.5 MG PO CP24: 37.5000 mg | ORAL_CAPSULE | Freq: Every day | ORAL | Status: DC

## 2013-08-04 NOTE — Telephone Encounter (Signed)
I will not rx TID xanax i will send rx for effexor which is similar to cymbalta - erx done Take effexor once daily in addition to xanax as ongoing

## 2013-08-04 NOTE — Telephone Encounter (Signed)
Patient phoned stating that although insurance had approved previously ordered cymbalta, she still can't afford it.  Upon MAR review, cymbalta was d/c'ed 08/02/13.  She is wanting the xanax increased back up to three times a day.  Last OV with PCP 07/26/13; xanax last ordered same day.  Please advise.   CB# [phone]

## 2013-08-05 NOTE — Telephone Encounter (Signed)
Notified patient of MD response and she stated she had already picked up the newly prescribed effexor.

## 2013-08-18 ENCOUNTER — Ambulatory Visit: Payer: No Typology Code available for payment source | Admitting: Licensed Clinical Social Worker

## 2013-09-08 ENCOUNTER — Encounter: Payer: Self-pay | Admitting: Internal Medicine

## 2013-09-08 ENCOUNTER — Ambulatory Visit (INDEPENDENT_AMBULATORY_CARE_PROVIDER_SITE_OTHER): Payer: No Typology Code available for payment source | Admitting: Internal Medicine

## 2013-09-08 ENCOUNTER — Telehealth: Payer: Self-pay | Admitting: Internal Medicine

## 2013-09-08 VITALS — BP 132/80 | HR 102 | Temp 98.5°F | Ht 65.0 in | Wt 215.0 lb

## 2013-09-08 DIAGNOSIS — J45909 Unspecified asthma, uncomplicated: Secondary | ICD-10-CM

## 2013-09-08 DIAGNOSIS — I10 Essential (primary) hypertension: Secondary | ICD-10-CM

## 2013-09-08 MED ORDER — METHYLPREDNISOLONE ACETATE 80 MG/ML IJ SUSP
120.0000 mg | Freq: Once | INTRAMUSCULAR | Status: AC
  Administered 2013-09-08: 120 mg via INTRAMUSCULAR

## 2013-09-08 MED ORDER — VALSARTAN-HYDROCHLOROTHIAZIDE 160-25 MG PO TABS: 1.0000 | ORAL_TABLET | Freq: Every day | ORAL | Status: DC

## 2013-09-08 MED ORDER — ALBUTEROL SULFATE (2.5 MG/3ML) 0.083% IN NEBU: 2.5000 mg | INHALATION_SOLUTION | RESPIRATORY_TRACT | Status: DC | PRN

## 2013-09-08 MED ORDER — ESOMEPRAZOLE MAGNESIUM 40 MG PO CPDR: 40.0000 mg | DELAYED_RELEASE_CAPSULE | Freq: Every day | ORAL | Status: DC

## 2013-09-08 MED ORDER — HYDROCODONE-HOMATROPINE 5-1.5 MG/5ML PO SYRP: 5.0000 mL | ORAL_SOLUTION | Freq: Four times a day (QID) | ORAL | Status: DC | PRN

## 2013-09-08 MED ORDER — AZITHROMYCIN 250 MG PO TABS: ORAL_TABLET | ORAL | Status: DC

## 2013-09-08 NOTE — Telephone Encounter (Signed)
Patient Information:  Caller Name: Nyeisha  Phone: [phone]  Patient: Gloria [name], Gloria [name]  Gender: Female  DOB: Nov 20, 1950  Age: 63 Years  PCP: Gwendolyn Grant (Adults only)  Office Follow Up:  Does the office need to follow up with this patient?: Yes  Instructions For The Office: SEE RN NOTE.  RN Note:  Transferred to [facility] due to PCP out of office for today and being transferred by PCP to [facility] for these symptoms. Report to Joellen Jersey, nurse in [place], who will call patient at [phone] due to lost connection with server.  Symptoms  Reason For Call & Symptoms: Coughing with difficulty breathing and chest tightness. Has used asthma treatment x2, 20 minutes apart with minimal relief. Patient also reports feeling anxious. PMH: Asthma, Anxiety  Reviewed Health History In EMR: Yes  Reviewed Medications In EMR: Yes  Reviewed Allergies In EMR: Yes  Reviewed Surgeries / Procedures: Yes  Date of Onset of Symptoms: 09/05/2013  Guideline(s) Used:  Asthma Attack  Disposition Per Guideline:   Go to ED Now (or to Office with PCP Approval)  Reason For Disposition Reached:   Chest pain  Advice Given:  N/A  Patient Will Follow Care Advice:  YES

## 2013-09-08 NOTE — Telephone Encounter (Signed)
I spoke with Mrs Acocella and scheduled and appt with MW today at 4pm as she feels she is not bad enough for ER but wants to be seen for cough, SOB, wheezing. She has been using nebs and inhalers as well as GERD suggestions from [facility].

## 2013-09-08 NOTE — Patient Instructions (Addendum)
Depromedrol 120 mg today  Nexium  40 mg   Take 30-60 min before first meal of the day and Pepcid 20 mg one bedtime until return to office - this is the best way to tell whether stomach acid is contributing to your problem.    Stop hyzar and use valsartan 160/ 25 one daily   For short of breath > nebulizer every 4 hours  For cough > hydrocone cough syrup.  Once cough better use dulera Take 2 puffs first thing in am and then another 2 puffs about 12 hours later.   Please schedule a follow up office visit in 4 weeks, sooner if needed with pfts on return

## 2013-09-08 NOTE — Telephone Encounter (Signed)
Noted and agree with triage - thanks

## 2013-09-08 NOTE — Progress Notes (Signed)
Subjective:    Patient ID: Gloria [name], female    DOB: [birth year]    MRN: [id number]    Brief patient profile:  38 yobf sickly child (? Pna, whooping cough, asthma) never full aerobic tolerance then better ages 63- 62 and no daily rx or need to go doctor and self rx with otcs like nyquil then much  Worse since Oct 27, 2004 when mother died and since then freq prns including   albuterol neb worse since October 28, 2011 and needing daily dulera 200 (though admits not consistent with it) referred 08/02/2013 by Dr Basilio Cairo for cough.    History of Present Illness  08/02/2013 1st [facility] office visit/ Sinda Leedom cc mid prod (always white mucus) cough every day esp in am x sev years better on prednisone but "gained too much wt" from baseline 180  - wakes up daily 3 am smothering / hacking > goes to [facility] and uses dulera and helps and props up head and goes back to bed and then wakes up s flare. Also neb maybe once a month. Worse with pollen exp x 2 years  rec Pepcid ac 20 mg one at bedtime until return. GERD diet  Dulera 200 2bid Work on hfa   09/08/2013 acute  ov/Chena Chohan re: asthma/ cough > sob Chief Complaint  Patient presents with  . Acute Visit    Pt c/o increased cough, SOB and wheezing x 4 days. Cough is prod with white sputum. The past few days she has been waking up SOB and having to use albuterol nebs.    no sob p nebs/ still violent cough though no excess mucus  No obvious day to day or daytime variabilty or assoc chronic cough or cp or chest tightness, subjective wheeze overt sinus or hb symptoms. No unusual exp hx or h/o childhood pna/ asthma or knowledge of premature birth.  Sleeping ok without nocturnal  or early am exacerbation  of respiratory  c/o's or need for noct saba. Also denies any obvious fluctuation of symptoms with weather or environmental changes or other aggravating or alleviating factors except as outlined above   Current Medications, Allergies, Complete Past Medical History, Past Surgical  History, Family History, and Social History were reviewed in [employer] record.  ROS  The following are not active complaints unless bolded sore throat, dysphagia, dental problems, itching, sneezing,  nasal congestion or excess/ purulent secretions, ear ache,   fever, chills, sweats, unintended wt loss, pleuritic or exertional cp, hemoptysis,  orthopnea pnd or leg swelling, presyncope, palpitations, heartburn, abdominal pain, anorexia, nausea, vomiting, diarrhea  or change in bowel or urinary habits, change in stools or urine, dysuria,hematuria,  rash, arthralgias, visual complaints, headache, numbness weakness or ataxia or problems with walking or coordination,  change in mood/affect or memory.          .            Objective:   Physical Exam  amb bf nad with prominent pseudowheeze completely resolves with purse lip maneuver  09/08/2013        215  Wt Readings from Last 3 Encounters:  08/02/13 219 lb 9.6 oz (99.61 kg)  07/26/13 219 lb 12.8 oz (99.701 kg)  07/14/13 215 lb 4 oz (97.637 kg)      HEENT: nl dentition, turbinates, and orophanx. Nl external ear canals without cough reflex   NECK :  without JVD/Nodes/TM/ nl carotid upstrokes bilaterally   LUNGS: no acc muscle use, clear to A and P bilaterally without  cough on insp or exp maneuvers   CV:  RRR  no s3 or murmur or increase in P2, no edema   ABD:  soft and nontender with nl excursion in the supine position. No bruits or organomegaly, bowel sounds nl  MS:  warm without deformities, calf tenderness, cyanosis or clubbing  SKIN: warm and dry without lesions    NEURO:  alert, approp, no deficits   cxr 04/15/12  No acute cardiopulmonary abnormalities.         Assessment & Plan:

## 2013-09-09 ENCOUNTER — Telehealth: Payer: Self-pay | Admitting: Internal Medicine

## 2013-09-09 NOTE — Telephone Encounter (Signed)
Called and spoke with pt. She reports the hydromet cough syrup is not helping with her cough. She reports she has coughed all night and all morning. She reports she can take medications that have codeine in it. Please advise MW thanks  Allergies  Allergen Reactions  . Lisinopril     cough  . Codeine Itching  . Compazine     Stroke like symptoms  . Haloperidol Decanoate Other (See Comments)    Extrapyramidal syndrome  . Penicillins Nausea And Vomiting

## 2013-09-09 NOTE — Assessment & Plan Note (Addendum)
Not responding to max dulera so ? All  difficult to control asthma vs vcd DDX of  difficult airways managment all start with A and  include Adherence, Ace Inhibitors, Acid Reflux, Active Sinus Disease, Alpha 1 Antitripsin deficiency, Anxiety masquerading as Airways dz,  ABPA,  allergy(esp in young), Aspiration (esp in elderly), Adverse effects of DPI,  Active smokers, plus two Bs  = Bronchiectasis and Beta blocker use..and one C= CHF  Adherence is always the initial "prime suspect" and is a multilayered concern that requires a "trust but verify" approach in every patient - starting with knowing how to use medications, especially inhalers, correctly, keeping up with refills and understanding the fundamental difference between maintenance and prns vs those medications only taken for a very short course and then stopped and not refilled.   ? Acid (or non-acid) GERD > always difficult to exclude as up to 75% of pts in some series report no assoc GI/ Heartburn symptoms> rec max (24h)  acid suppression and diet restrictions/ reviewed and instructions given in writing.  ? Anxiety > dx of exclusion  ? ARB reaction > For reasons that may related to vascular permability and nitric oxide pathways but not elevated  bradykinin levels (as seen with  ACEi use) losartan in the generic form has been reported now from mulitple sources  to cause a similar pattern of non-specific  upper airway symptoms as seen with acei.   This has not been reported with exposure to the other ARB's to date, so it seems reasonable for now to try either generic diovan or avapro if ARB needed or use an alternative class altogether.  For now try valsartan See:  Ann Allergy Asthma Immunol  2008: 101: p [phone]    ? Allergy > depomedrol 120 mg IM  ? Active sinus/ rhitis/pharangits > zpak as unlikley   See instructions for specific recommendations which were reviewed directly with the patient who was given a copy with highlighter outlining the  key components.

## 2013-09-09 NOTE — Telephone Encounter (Signed)
This does have codeine in it and is the strongest cough med we can use so rec use double the dose x 24 h and if not better bring all meds and add on to schedule for 3/13

## 2013-09-09 NOTE — Telephone Encounter (Signed)
lmomtcb x1 for pt 

## 2013-09-09 NOTE — Assessment & Plan Note (Signed)
Changed cozar to valsartan 160/25 one daily

## 2013-09-10 NOTE — Telephone Encounter (Signed)
Pt is aware of MW's recs. She will call back if things do not improve. Nothing further was needed.

## 2013-09-10 NOTE — Telephone Encounter (Signed)
Lmtcbx2. Stryder Poitra, CMA  

## 2013-09-13 ENCOUNTER — Ambulatory Visit: Payer: No Typology Code available for payment source | Admitting: Internal Medicine

## 2013-09-20 ENCOUNTER — Telehealth: Payer: Self-pay | Admitting: Internal Medicine

## 2013-09-20 ENCOUNTER — Ambulatory Visit (HOSPITAL_COMMUNITY): Admission: RE | Admit: 2013-09-20 | Payer: No Typology Code available for payment source | Source: Ambulatory Visit

## 2013-09-20 MED ORDER — AMOXICILLIN-POT CLAVULANATE 875-125 MG PO TABS: 1.0000 | ORAL_TABLET | Freq: Two times a day (BID) | ORAL | Status: DC

## 2013-09-20 NOTE — Telephone Encounter (Signed)
Augmentin 875 mg take one pill twice daily  X 10 days - take at breakfast and supper with large glass of water.  It would help reduce the usual side effects (diarrhea and yeast infections) if you ate cultured yogurt at lunch.   Prednisone 10 mg take  4 each am x 2 days,   2 each am x 2 days,  1 each am x 2 days and stop  See Tammy NP w/in 1 weeks with all your medications, even over the counter meds, separated in two separate bags, the ones you take no matter what vs the ones you stop once you feel better and take only as needed when you feel you need them.   Tammy  will generate for you a new user friendly medication calendar that will put [place] all on the same page re: your medication use.     Without this process, it simply isn't possible to assure that we are providing  your outpatient care  with  the attention to detail we feel you deserve.   If we cannot assure that you're getting that kind of care,  then we cannot manage your problem effectively from this clinic.  Once you have seen Tammy and we are sure that we're all on the same page with your medication use she will arrange follow up with me.

## 2013-09-20 NOTE — Telephone Encounter (Signed)
Spoke with the pt  She states that she is not improving at all since last ov She is still SOB and wheezing, and as of 2 days ago her sputum has turned green  She is out of her hydromet  Would like abx and states zpack "doesnt work for me anymore"  I offered appt for tomorrow and she did not want to wait this long, and she refused ov this pm b/c "I didn't take a bath" She has ov pending with PFT's for 10/15/13 Please advise thanks! Allergies  Allergen Reactions  . Lisinopril     cough  . Codeine Itching  . Compazine     Stroke like symptoms  . Haloperidol Decanoate Other (See Comments)    Extrapyramidal syndrome  . Penicillins Nausea And Vomiting

## 2013-09-20 NOTE — Telephone Encounter (Signed)
Called and spoke with pt. She is aware of recs. She does not want the prednisone called in. She also wants to wait to have the appt scheduled with TP for right now. She is on a budget and needs her money. Nothing further needed

## 2013-09-21 ENCOUNTER — Ambulatory Visit (HOSPITAL_COMMUNITY): Payer: No Typology Code available for payment source

## 2013-09-28 ENCOUNTER — Ambulatory Visit (HOSPITAL_COMMUNITY)
Admission: RE | Admit: 2013-09-28 | Discharge: 2013-09-28 | Disposition: A | Discharge status: Home / Self Care | Payer: No Typology Code available for payment source | Source: Ambulatory Visit | Attending: Internal Medicine | Admitting: Internal Medicine

## 2013-09-28 DIAGNOSIS — Z1239 Encounter for other screening for malignant neoplasm of breast: Secondary | ICD-10-CM

## 2013-09-28 DIAGNOSIS — Z1231 Encounter for screening mammogram for malignant neoplasm of breast: Secondary | ICD-10-CM | POA: Insufficient documentation

## 2013-10-05 ENCOUNTER — Telehealth: Payer: Self-pay | Admitting: Internal Medicine

## 2013-10-05 NOTE — Telephone Encounter (Signed)
Per OV 09/08/13: Patient Instructions      Depromedrol 120 mg today  Nexium  40 mg   Take 30-60 min before first meal of the day and Pepcid 20 mg one bedtime until return to office - this is the best way to tell whether stomach acid is contributing to your problem.   Stop hyzar and use valsartan 160/ 25 one daily  For short of breath > nebulizer every 4 hours For cough > hydrocone cough syrup. Once cough better use dulera Take 2 puffs first thing in am and then another 2 puffs about 12 hours later.  Please schedule a follow up office visit in 4 weeks, sooner if needed with pfts on return    ---  Called spoke with pt. She reports the valsartan is $150- she can't afford this. She reports the diuretic in the hyzar was not strong enough. She is wanting something else to be called in. Pt aware MW not in until Friday. Please advise thanks  Allergies  Allergen Reactions  . Lisinopril     cough  . Codeine Itching  . Compazine     Stroke like symptoms  . Haloperidol Decanoate Other (See Comments)    Extrapyramidal syndrome  . Penicillins Nausea And Vomiting

## 2013-10-06 ENCOUNTER — Other Ambulatory Visit: Payer: Self-pay | Admitting: Internal Medicine

## 2013-10-06 NOTE — Telephone Encounter (Signed)
Another option avapro 150 mg daily and HCTZ 50 mg daily

## 2013-10-06 NOTE — Telephone Encounter (Signed)
If bp too low on this combo can always reduce avapro as easy to break in half - other option is regroup with Tammy before further changes in bp meds as there are many options that don't include losartan

## 2013-10-08 MED ORDER — IRBESARTAN 150 MG PO TABS: 150.0000 mg | ORAL_TABLET | Freq: Every day | ORAL | Status: DC

## 2013-10-08 MED ORDER — HYDROCHLOROTHIAZIDE 50 MG PO TABS: 50.0000 mg | ORAL_TABLET | Freq: Every day | ORAL | Status: DC

## 2013-10-08 NOTE — Telephone Encounter (Signed)
Spoke with pt and advised of Dr Gustavus Bryant recommendations.    Pt verbalized understanding and new rx's sent to pharmacy.

## 2013-10-15 ENCOUNTER — Ambulatory Visit: Payer: Self-pay | Admitting: Internal Medicine

## 2013-11-02 ENCOUNTER — Telehealth: Payer: Self-pay | Admitting: Internal Medicine

## 2013-11-02 NOTE — Telephone Encounter (Signed)
Spoke with pt. We have changed her BP meds to Avapro 150mg  and HCTZ 50mg . States that the BP med is doing fine but the fluid pill is not working. She is retaining extra fluid. Would like to have this increased or changed to something else.  MW - please advise. Thanks.

## 2013-11-02 NOTE — Telephone Encounter (Signed)
There is a danger of using stronger fluid pills without monitoring potassium.  Would rec double the hctz x 3 days then ov with Gloria [name]

## 2013-11-03 NOTE — Telephone Encounter (Signed)
Pt aware of recs per MW Will increase HCTZ x 3 days and OV with TP set up for Monday 11/08/13 at 107 Pt states that she has a family member in [place] and is unsure if she will be able to make appt--pt states that she wanted to go ahead and get on TP schedule to be seen but that she will call and cancel if need be.  Nothing further needed.

## 2013-11-08 ENCOUNTER — Ambulatory Visit: Payer: No Typology Code available for payment source | Admitting: Adult Health

## 2013-11-15 ENCOUNTER — Telehealth: Payer: Self-pay | Admitting: Internal Medicine

## 2013-11-15 NOTE — Telephone Encounter (Signed)
Patient Information:  Caller Name: Roshell  Phone: [phone]  Patient: Gloria [name], Gloria [name]  Gender: Female  DOB: [birth year]  Age: 63 Years  PCP: Gwendolyn Grant (Adults only)  Office Follow Up:  Does the office need to follow up with this patient?: No  Instructions For The Office: N/A   Symptoms  Reason For Call & Symptoms: Sarely states she has a serious migraine headache onset 11/12/13. Rates headache a 10 on 1/10 pt scale.Asking can she be seen in office? Per headache  protocol has go to ED now or to office with PCP approval. Unable to schedule work in appt . Per practice profile advised Eliz to be seen in [place]. Advised to F/u with office if needed.  Reviewed Health History In EMR: Yes  Reviewed Medications In EMR: Yes  Reviewed Allergies In EMR: Yes  Reviewed Surgeries / Procedures: Yes  Date of Onset of Symptoms: 11/15/2013  Guideline(s) Used:  Headache  Disposition Per Guideline:   Go to ED Now (or to Office with PCP Approval)  Reason For Disposition Reached:   Severe headache, states "worst headache" of life  Advice Given:  N/A  Patient Will Follow Care Advice:  YES

## 2014-01-13 ENCOUNTER — Telehealth: Payer: Self-pay | Admitting: Internal Medicine

## 2014-01-13 NOTE — Telephone Encounter (Signed)
Pt would like to transfer from Dr. Asa Lente to Roxy Cedar. Please advise.

## 2014-01-13 NOTE — Telephone Encounter (Signed)
Ok with me 

## 2014-01-17 NOTE — Telephone Encounter (Signed)
Pt has been scheduled for 01/21/14

## 2014-01-17 NOTE — Telephone Encounter (Signed)
Ok with me 

## 2014-01-21 ENCOUNTER — Encounter: Payer: Self-pay | Admitting: Family

## 2014-01-21 ENCOUNTER — Ambulatory Visit (INDEPENDENT_AMBULATORY_CARE_PROVIDER_SITE_OTHER): Payer: No Typology Code available for payment source | Admitting: Family

## 2014-01-21 VITALS — BP 126/80 | HR 114 | Temp 98.8°F | Ht 65.0 in | Wt 212.0 lb

## 2014-01-21 DIAGNOSIS — E7800 Pure hypercholesterolemia, unspecified: Secondary | ICD-10-CM

## 2014-01-21 DIAGNOSIS — J45909 Unspecified asthma, uncomplicated: Secondary | ICD-10-CM

## 2014-01-21 DIAGNOSIS — I10 Essential (primary) hypertension: Secondary | ICD-10-CM

## 2014-01-21 DIAGNOSIS — J4530 Mild persistent asthma, uncomplicated: Secondary | ICD-10-CM

## 2014-01-21 DIAGNOSIS — E119 Type 2 diabetes mellitus without complications: Secondary | ICD-10-CM

## 2014-01-21 DIAGNOSIS — F329 Major depressive disorder, single episode, unspecified: Secondary | ICD-10-CM

## 2014-01-21 DIAGNOSIS — F3289 Other specified depressive episodes: Secondary | ICD-10-CM

## 2014-01-21 LAB — HEMOGLOBIN A1C
Hgb A1c MFr Bld: 7 % — ABNORMAL HIGH (ref <5.7)
Mean Plasma Glucose: 154 mg/dL — ABNORMAL HIGH (ref <117)

## 2014-01-21 LAB — HEPATIC FUNCTION PANEL
ALT: 18 U/L (ref 0–35)
AST: 19 U/L (ref 0–37)
Albumin: 3.9 g/dL (ref 3.5–5.2)
Alkaline Phosphatase: 92 U/L (ref 39–117)
Bilirubin, Direct: 0.1 mg/dL (ref 0.0–0.3)
Indirect Bilirubin: 0.3 mg/dL (ref 0.2–1.2)
Total Bilirubin: 0.4 mg/dL (ref 0.2–1.2)
Total Protein: 7.6 g/dL (ref 6.0–8.3)

## 2014-01-21 LAB — BASIC METABOLIC PANEL
BUN: 10 mg/dL (ref 6–23)
CO2: 27 mEq/L (ref 19–32)
Calcium: 9.3 mg/dL (ref 8.4–10.5)
Chloride: 100 mEq/L (ref 96–112)
Creat: 0.74 mg/dL (ref 0.50–1.10)
Glucose, Bld: 81 mg/dL (ref 70–99)
Potassium: 3.7 mEq/L (ref 3.5–5.3)
Sodium: 137 mEq/L (ref 135–145)

## 2014-01-21 MED ORDER — FLUNISOLIDE HFA 80 MCG/ACT IN AERS: 2.0000 | INHALATION_SPRAY | Freq: Two times a day (BID) | RESPIRATORY_TRACT | Status: DC

## 2014-01-21 MED ORDER — GLUCOSE BLOOD VI STRP: ORAL_STRIP | Status: DC

## 2014-01-21 MED ORDER — HYDROCODONE-ACETAMINOPHEN 7.5-325 MG PO TABS: 1.0000 | ORAL_TABLET | Freq: Every day | ORAL | Status: DC | PRN

## 2014-01-21 NOTE — Addendum Note (Signed)
Addended by: Santiago Bumpers on: 01/21/2014 04:28 PM   Modules accepted: Orders

## 2014-01-21 NOTE — Progress Notes (Signed)
Subjective:    Patient ID: Gloria [name], female    DOB: [birth year], 63 y.o.   MRN: [id number]  HPI 62 year old African American female, nonsmoker inpatient to be established and then today with a history of hypertension, hyperlipidemia, asthma, anxiety, depression, insomnia. She's currently stable on medications. Was found to be hyperglycemic with an A1c of 7.0 in January. Not currently on medication. Has reduced her sugars and sweets over the last few months. Reports occasional burning and tingling in her feet and goes. It reports she began exercising this past weekend. Has a family history significant for type 2 diabetes.  Patient reports recently divorcing after 2 years of marriage and lives and her husband to a younger woman. Continues to have stress that. Reports her mother and aunt passed away within the last few years.  Review of Systems  Constitutional: Negative.   HENT: Negative.   Respiratory: Negative.   Cardiovascular: Negative.   Gastrointestinal: Negative.   Endocrine: Positive for polydipsia. Negative for cold intolerance and heat intolerance.  Genitourinary: Negative.   Musculoskeletal: Negative.   Neurological: Positive for numbness. Negative for dizziness.       Tingling in the feet that comes and goes  Psychiatric/Behavioral: Negative.    Past Medical History  Diagnosis Date  . Depressive disorder, not elsewhere classified   . Other and unspecified hyperlipidemia   . Unspecified essential hypertension   . Migraine, unspecified, without mention of intractable migraine without mention of status migrainosus   . Unspecified urinary incontinence   . Arthritis   . Chronic bronchitis   . Anxiety state, unspecified   . Postmenopausal symptoms   . Panic attacks   . Allergic rhinitis, cause unspecified 03/30/2013  . Borderline diabetes mellitus     a1c 7.0 (07/26/13)     History   Social History  . Marital Status: Divorced    Spouse Name: N/A    Number of  Children: 1  . Years of Education: N/A   Occupational History  . Self employed-interior design/flower arrangements    Social History Main Topics  . Smoking status: Never Smoker   . Smokeless tobacco: Never Used  . Alcohol Use: No  . Drug Use: No  . Sexual Activity: Not Currently   Other Topics Concern  . Not on file   Social History Narrative  . No narrative on file    Past Surgical History  Procedure Laterality Date  . Abdominal hysterectomy  2002  . Dislocated shoulder      right  . Diagnostic laparoscopy    . Tumor right ankle      Family History  Problem Relation Age of Onset  . Arthritis      family history  . Hypertension      grandparent  . Colon cancer Maternal Grandfather   . Asthma Son     Allergies  Allergen Reactions  . Lisinopril     cough  . Codeine Itching  . Compazine     Stroke like symptoms  . Haloperidol Decanoate Other (See Comments)    Extrapyramidal syndrome  . Penicillins Nausea And Vomiting    Current Outpatient Prescriptions on File Prior to Visit  Medication Sig Dispense Refill  . albuterol (PROVENTIL) (2.5 MG/3ML) 0.083% nebulizer solution Take 3 mLs (2.5 mg total) by nebulization every 4 (four) hours as needed for wheezing.  25 vial  2  . ALPRAZolam (XANAX) 1 MG tablet Take 1 tablet (1 mg total) by mouth 2 (two) times daily as needed  for anxiety or sleep. Do not fill more than once every 30 days  60 tablet  5  . Cholecalciferol (VITAMIN D-3) 5000 UNITS TABS Take 1 tablet by mouth daily.      . Cyanocobalamin (VITAMIN B 12 PO) Take 1 capsule by mouth daily.        Marland Kitchen esomeprazole (NEXIUM) 40 MG capsule Take 1 capsule (40 mg total) by mouth daily at 12 noon.      . hydrochlorothiazide (HYDRODIURIL) 50 MG tablet Take 1 tablet (50 mg total) by mouth daily.  30 tablet  6  . ibuprofen (ADVIL,MOTRIN) 600 MG tablet Take 1 tablet (600 mg total) by mouth every 8 (eight) hours as needed for pain.  60 tablet  1  . irbesartan (AVAPRO) 150 MG  tablet Take 1 tablet (150 mg total) by mouth daily.  30 tablet  6  . mometasone-formoterol (DULERA) 200-5 MCG/ACT AERO Inhale 2 puffs into the lungs 2 (two) times daily.  3 Inhaler  3  . Multiple Vitamins-Minerals (CENTRUM SILVER PO) Take 1 capsule by mouth daily.         No current facility-administered medications on file prior to visit.    BP 126/80  Pulse 114  Temp(Src) 98.8 F (37.1 C) (Oral)  Ht 5\' 5"  (1.651 m)  Wt 212 lb (96.163 kg)  BMI 35.28 kg/m2chart     Objective:   Physical Exam  Constitutional: She is oriented to person, place, and time. She appears well-developed and well-nourished.  HENT:  Right Ear: External ear normal.  Left Ear: External ear normal.  Nose: Nose normal.  Mouth/Throat: Oropharynx is clear and moist.  Neck: Normal range of motion. Neck supple. No thyromegaly present.  Cardiovascular: Normal rate, regular rhythm and normal heart sounds.   Pulmonary/Chest: Effort normal and breath sounds normal.  Abdominal: Soft. Bowel sounds are normal.  Musculoskeletal: Normal range of motion.  Neurological: She is alert and oriented to person, place, and time.  Skin: Skin is warm and dry.  Psychiatric: She has a normal mood and affect.          Assessment & Plan:  [name] was seen today for establish care.  Diagnoses and associated orders for this visit:  Type II or unspecified type diabetes mellitus without mention of complication, not stated as uncontrolled - Hemoglobin A1c  Unspecified essential hypertension - Basic Metabolic Panel - Hepatic Function Panel  Pure hypercholesterolemia - Basic Metabolic Panel  Depressive disorder, not elsewhere classified  Asthma, chronic, mild persistent, uncomplicated  Other Orders - HYDROcodone-acetaminophen (NORCO) 7.5-325 MG per tablet; Take 1 tablet by mouth daily as needed for moderate pain. - Flunisolide HFA (AEROSPAN) 80 MCG/ACT AERS; Inhale 2 puffs into the lungs 2 (two) times daily.   Call the  office with any questions or concerns. Recheck for CPX in 6 months and sooner as needed.

## 2014-01-21 NOTE — Patient Instructions (Signed)
Diabetes and Exercise Exercising regularly is important. It is not just about losing weight. It has many health benefits, such as:  Improving your overall fitness, flexibility, and endurance.  Increasing your bone density.  Helping with weight control.  Decreasing your body fat.  Increasing your muscle strength.  Reducing stress and tension.  Improving your overall health. People with diabetes who exercise gain additional benefits because exercise:  Reduces appetite.  Improves the body's use of blood sugar (glucose).  Helps lower or control blood glucose.  Decreases blood pressure.  Helps control blood lipids (such as cholesterol and triglycerides).  Improves the body's use of the hormone insulin by:  Increasing the body's insulin sensitivity.  Reducing the body's insulin needs.  Decreases the risk for heart disease because exercising:  Lowers cholesterol and triglycerides levels.  Increases the levels of good cholesterol (such as high-density lipoproteins [HDL]) in the body.  Lowers blood glucose levels. YOUR ACTIVITY PLAN  Choose an activity that you enjoy and set realistic goals. Your health care provider or diabetes educator can help you make an activity plan that works for you. Exercise regularly as directed by your health care provider. This includes:  Performing resistance training twice a week such as push-ups, sit-ups, lifting weights, or using resistance bands.  Performing 150 minutes of cardio exercises each week such as walking, running, or playing sports.  Staying active and spending no more than 90 minutes at one time being inactive. Even short bursts of exercise are good for you. Three 10-minute sessions spread throughout the day are just as beneficial as a single 30-minute session. Some exercise ideas include:  Taking the dog for a walk.  Taking the stairs instead of the elevator.  Dancing to your favorite song.  Doing an exercise  video.  Doing your favorite exercise with a friend. RECOMMENDATIONS FOR EXERCISING WITH TYPE 1 OR TYPE 2 DIABETES   Check your blood glucose before exercising. If blood glucose levels are greater than 240 mg/dL, check for urine ketones. Do not exercise if ketones are present.  Avoid injecting insulin into areas of the body that are going to be exercised. For example, avoid injecting insulin into:  The arms when playing tennis.  The legs when jogging.  Keep a record of:  Food intake before and after you exercise.  Expected peak times of insulin action.  Blood glucose levels before and after you exercise.  The type and amount of exercise you have done.  Review your records with your health care provider. Your health care provider will help you to develop guidelines for adjusting food intake and insulin amounts before and after exercising.  If you take insulin or oral hypoglycemic agents, watch for signs and symptoms of hypoglycemia. They include:  Dizziness.  Shaking.  Sweating.  Chills.  Confusion.  Drink plenty of water while you exercise to prevent dehydration or heat stroke. Body water is lost during exercise and must be replaced.  Talk to your health care provider before starting an exercise program to make sure it is safe for you. Remember, almost any type of activity is better than none. Document Released: 09/07/2003 Document Revised: 11/01/2013 Document Reviewed: 11/24/2012 ExitCare Patient Information 2015 [employer]. This information is not intended to replace advice given to you by your health care provider. Make sure you discuss any questions you have with your health care provider.  

## 2014-01-21 NOTE — Addendum Note (Signed)
Addended by: Elmer Picker on: 01/21/2014 04:19 PM   Modules accepted: Orders

## 2014-01-21 NOTE — Progress Notes (Signed)
Pre visit review using our clinic review tool, if applicable. No additional management support is needed unless otherwise documented below in the visit note. 

## 2014-02-01 ENCOUNTER — Ambulatory Visit: Payer: No Typology Code available for payment source | Admitting: Family

## 2014-02-01 ENCOUNTER — Encounter: Payer: Self-pay | Admitting: Family

## 2014-02-01 ENCOUNTER — Telehealth: Payer: Self-pay | Admitting: Family

## 2014-02-01 ENCOUNTER — Ambulatory Visit (INDEPENDENT_AMBULATORY_CARE_PROVIDER_SITE_OTHER): Payer: No Typology Code available for payment source | Admitting: Family

## 2014-02-01 VITALS — BP 130/90 | HR 98 | Temp 97.8°F | Wt 211.0 lb

## 2014-02-01 DIAGNOSIS — R062 Wheezing: Secondary | ICD-10-CM

## 2014-02-01 DIAGNOSIS — J209 Acute bronchitis, unspecified: Secondary | ICD-10-CM

## 2014-02-01 DIAGNOSIS — R110 Nausea: Secondary | ICD-10-CM

## 2014-02-01 DIAGNOSIS — G43011 Migraine without aura, intractable, with status migrainosus: Secondary | ICD-10-CM

## 2014-02-01 MED ORDER — ALBUTEROL SULFATE (2.5 MG/3ML) 0.083% IN NEBU
2.5000 mg | INHALATION_SOLUTION | Freq: Once | RESPIRATORY_TRACT | Status: AC
  Administered 2014-02-01: 2.5 mg via RESPIRATORY_TRACT

## 2014-02-01 MED ORDER — DOXYCYCLINE HYCLATE 100 MG PO TABS: 100.0000 mg | ORAL_TABLET | Freq: Two times a day (BID) | ORAL | Status: DC

## 2014-02-01 MED ORDER — METHYLPREDNISOLONE 4 MG PO KIT: PACK | ORAL | Status: AC

## 2014-02-01 MED ORDER — MEPERIDINE HCL 25 MG/ML IJ SOLN
50.0000 mg | Freq: Once | INTRAMUSCULAR | Status: AC
Start: 2014-02-01 — End: 2014-02-01
  Administered 2014-02-01: 50 mg via INTRAMUSCULAR

## 2014-02-01 MED ORDER — CVS LANCETS 21G MISC: Status: DC

## 2014-02-01 MED ORDER — PROMETHAZINE HCL 25 MG/ML IJ SOLN
25.0000 mg | Freq: Once | INTRAMUSCULAR | Status: AC
  Administered 2014-02-01: 25 mg via INTRAMUSCULAR

## 2014-02-01 NOTE — Telephone Encounter (Signed)
noted 

## 2014-02-01 NOTE — Patient Instructions (Signed)

## 2014-02-01 NOTE — Progress Notes (Signed)
Subjective:    Patient ID: Gloria [name], female    DOB: [birth year], 63 y.o.   MRN: [id number]  Wheezing  Associated symptoms include coughing and headaches.  Cough Associated symptoms include headaches and wheezing.  Headache  Associated symptoms include coughing.   63 year old [name], nonsmoker with a history of asthma, type 2 diabetes, anxiety, obesity, and migraine headaches is in today with complaint of congestion and wheezing x2 weeks and worsening. She's been taking asthma medications as prescribed.  Also has concerns today about migraine headache that she rates at a dentist and with accompanying nausea and vomiting. Has taken over-the-counter medications without relief. Previous PCP was given Demerol when she had the headaches. Reports that a migraine approximately once every 6 months.   Review of Systems  Constitutional: Negative.   HENT: Negative for congestion.   Respiratory: Positive for cough and wheezing.   Cardiovascular: Negative.   Gastrointestinal: Negative.   Endocrine: Negative.   Musculoskeletal: Negative.   Skin: Negative.   Allergic/Immunologic: Negative.   Neurological: Positive for headaches.  Psychiatric/Behavioral: Negative.   All other systems reviewed and are negative.  Past Medical History  Diagnosis Date  . Depressive disorder, not elsewhere classified   . Other and unspecified hyperlipidemia   . Unspecified essential hypertension   . Migraine, unspecified, without mention of intractable migraine without mention of status migrainosus   . Unspecified urinary incontinence   . Arthritis   . Chronic bronchitis   . Anxiety state, unspecified   . Postmenopausal symptoms   . Panic attacks   . Allergic rhinitis, cause unspecified 03/30/2013  . Borderline diabetes mellitus     a1c 7.0 (07/26/13)     History   Social History  . Marital Status: Divorced    Spouse Name: N/A    Number of Children: 1  . Years of Education: N/A    Occupational History  . Self employed-interior design/flower arrangements    Social History Main Topics  . Smoking status: Never Smoker   . Smokeless tobacco: Never Used  . Alcohol Use: No  . Drug Use: No  . Sexual Activity: Not Currently   Other Topics Concern  . Not on file   Social History Narrative  . No narrative on file    Past Surgical History  Procedure Laterality Date  . Abdominal hysterectomy  2002  . Dislocated shoulder      right  . Diagnostic laparoscopy    . Tumor right ankle      Family History  Problem Relation Age of Onset  . Arthritis      family history  . Hypertension      grandparent  . Colon cancer Maternal Grandfather   . Asthma Son     Allergies  Allergen Reactions  . Lisinopril     cough  . Codeine Itching  . Compazine     Stroke like symptoms  . Haloperidol Decanoate Other (See Comments)    Extrapyramidal syndrome  . Penicillins Nausea And Vomiting    Current Outpatient Prescriptions on File Prior to Visit  Medication Sig Dispense Refill  . albuterol (PROVENTIL) (2.5 MG/3ML) 0.083% nebulizer solution Take 3 mLs (2.5 mg total) by nebulization every 4 (four) hours as needed for wheezing.  25 vial  2  . ALPRAZolam (XANAX) 1 MG tablet Take 1 tablet (1 mg total) by mouth 2 (two) times daily as needed for anxiety or sleep. Do not fill more than once every 30 days  60 tablet  5  . Cholecalciferol (VITAMIN D-3) 5000 UNITS TABS Take 1 tablet by mouth daily.      . Cyanocobalamin (VITAMIN B 12 PO) Take 1 capsule by mouth daily.        Marland Kitchen esomeprazole (NEXIUM) 40 MG capsule Take 1 capsule (40 mg total) by mouth daily at 12 noon.      . Flunisolide HFA (AEROSPAN) 80 MCG/ACT AERS Inhale 2 puffs into the lungs 2 (two) times daily.  1 Inhaler  0  . glucose blood (ACCU-CHEK AVIVA) test strip Use as instructed  100 each  12  . hydrochlorothiazide (HYDRODIURIL) 50 MG tablet Take 1 tablet (50 mg total) by mouth daily.  30 tablet  6  .  HYDROcodone-acetaminophen (NORCO) 7.5-325 MG per tablet Take 1 tablet by mouth daily as needed for moderate pain.  30 tablet  0  . ibuprofen (ADVIL,MOTRIN) 600 MG tablet Take 1 tablet (600 mg total) by mouth every 8 (eight) hours as needed for pain.  60 tablet  1  . irbesartan (AVAPRO) 150 MG tablet Take 1 tablet (150 mg total) by mouth daily.  30 tablet  6  . mometasone-formoterol (DULERA) 200-5 MCG/ACT AERO Inhale 2 puffs into the lungs 2 (two) times daily.  3 Inhaler  3  . Multiple Vitamins-Minerals (CENTRUM SILVER PO) Take 1 capsule by mouth daily.        Marland Kitchen zolpidem (AMBIEN) 10 MG tablet Take 5 mg by mouth at bedtime as needed for sleep.       No current facility-administered medications on file prior to visit.    BP 130/90  Pulse 98  Temp(Src) 97.8 F (36.6 C) (Oral)  Wt 211 lb (95.709 kg)  SpO2 97%chart    Objective:   Physical Exam  Constitutional: She is oriented to person, place, and time. She appears well-developed and well-nourished.  HENT:  Right Ear: External ear normal.  Left Ear: External ear normal.  Nose: Nose normal.  Mouth/Throat: Oropharynx is clear and moist.  Eyes: Pupils are equal, round, and reactive to light.  Neck: Normal range of motion. Neck supple.  Cardiovascular: Normal rate, regular rhythm and normal heart sounds.   Pulmonary/Chest: Effort normal and breath sounds normal.  Musculoskeletal: Normal range of motion.  Neurological: She is alert and oriented to person, place, and time.  Skin: Skin is warm and dry.  Psychiatric: She has a normal mood and affect.          Assessment & Plan:  Mystie was seen today for wheezing, cough and headache.  Diagnoses and associated orders for this visit:  Acute bronchitis, unspecified organism - albuterol (PROVENTIL) (2.5 MG/3ML) 0.083% nebulizer solution 2.5 mg; Take 3 mLs (2.5 mg total) by nebulization once.  Wheezing - albuterol (PROVENTIL) (2.5 MG/3ML) 0.083% nebulizer solution 2.5 mg; Take 3 mLs (2.5  mg total) by nebulization once.  Intractable migraine without aura and with status migrainosus - meperidine (DEMEROL) injection 50 mg; Inject 2 mLs (50 mg total) into the muscle once. - promethazine (PHENERGAN) injection 25 mg; Inject 1 mL (25 mg total) into the muscle once.  Nausea alone - promethazine (PHENERGAN) injection 25 mg; Inject 1 mL (25 mg total) into the muscle once.  Other Orders - methylPREDNISolone (MEDROL DOSEPAK) 4 MG tablet; follow package directions - doxycycline (VIBRA-TABS) 100 MG tablet; Take 1 tablet (100 mg total) by mouth 2 (two) times daily. - [employer] Lancets MISC; USE TO CHECK BLOOD GLUCOSE ONCE DAILY FASTING   Call the office with any questions or  concerns. Recheck as scheduled and as needed.

## 2014-02-01 NOTE — Progress Notes (Signed)
Pre visit review using our clinic review tool, if applicable. No additional management support is needed unless otherwise documented below in the visit note. 

## 2014-02-01 NOTE — Telephone Encounter (Signed)
Patient Information:  Caller Name: [place]  Phone: [phone]  Patient: Gloria [name], Gloria [name]  Gender: Female  DOB: Oct 22, 1950  Age: 63 Years  PCP: Roxy Cedar [facility])  Office Follow Up:  Does the office need to follow up with this patient?: No  Instructions For The Office: N/A   Symptoms  Reason For Call & Symptoms: Cough; onset "a while - maybe 6 months". Cough getting worse; she suspects sinus drainage causing tickle.  Caller requests specialist regarding sinuses.  She has tried Rx meds without success.  Additionally she relates she needs Rx for lancets.  She relates it feels like asthma when walking.  Reports she uses nebulizer "every day".  Yellowish sputum reported.  Emergent symptoms ruled out.  See Today or Tomorrow in Office per Asthma Attack guideline due to Intermittent mild wheezing persists > 5 days.  Reviewed Health History In EMR: Yes  Reviewed Medications In EMR: Yes  Reviewed Allergies In EMR: Yes  Reviewed Surgeries / Procedures: Yes  Date of Onset of Symptoms: Unknown  Treatments Tried: nebulizer treatments, oral medications  Treatments Tried Worked: No  Guideline(s) Used:  Cough  Asthma Attack  Disposition Per Guideline:   See Today or Tomorrow in Office  Reason For Disposition Reached:   Intermittent mild wheezing persists > 5 days  Advice Given:  Quick-Relief Asthma Medicine:   Start your quick-relief medicine (e.g., albuterol, salbutamol) at the first sign of any coughing or shortness of breath (don't wait for wheezing). Use your inhaler (2 puffs each time) or nebulizer every 4 hours. Continue the quick-relief medicine until you have not wheezed or coughed for 48 hours.  Long-Term-Control Asthma Medicine:  If you are using a controller medicine (e.g., inhaled steroids or cromolyn), continue to take it as directed.  Drinking Liquids:  Try to drink normal amount of liquids (e.g., water). Being adequately hydrated makes it easier to cough up the  sticky lung mucus.  Drinking Liquids:  Try to drink normal amount of liquids (e.g., water). Being adequately hydrated makes it easier to cough up the sticky lung mucus.  Humidifier:   If the air is dry, use a cool mist humidifier to prevent drying of the upper airway.  Hay Fever  : If you have nasal symptoms from hay fever, it's OK to take antihistamines (Reasons: poor control of allergic rhinitis makes asthma worse whereas antihistamines don't make asthma worse).  Avoid Triggers:  Avoid known triggers of asthma attacks (e.g., tobacco smoke, cats, other pets, feather pillows, exercise).  Call Back If:  Inhaled asthma medicine (nebulizer or inhaler) is needed more often than every 4 hours  Wheezing has not completely cleared after 5 days  You become worse.  Patient Will Follow Care Advice:  YES  Appointment Scheduled:  02/01/2014 10:45:00 Appointment Scheduled Provider:  Roxy Cedar [facility])

## 2014-02-28 ENCOUNTER — Telehealth: Payer: Self-pay | Admitting: Internal Medicine

## 2014-02-28 ENCOUNTER — Encounter: Payer: Self-pay | Admitting: Family

## 2014-02-28 ENCOUNTER — Ambulatory Visit (INDEPENDENT_AMBULATORY_CARE_PROVIDER_SITE_OTHER): Payer: No Typology Code available for payment source | Admitting: Family

## 2014-02-28 ENCOUNTER — Telehealth: Payer: Self-pay | Admitting: Family

## 2014-02-28 VITALS — BP 142/88 | HR 106 | Wt 209.0 lb

## 2014-02-28 DIAGNOSIS — E119 Type 2 diabetes mellitus without complications: Secondary | ICD-10-CM

## 2014-02-28 DIAGNOSIS — J209 Acute bronchitis, unspecified: Secondary | ICD-10-CM

## 2014-02-28 DIAGNOSIS — I10 Essential (primary) hypertension: Secondary | ICD-10-CM

## 2014-02-28 MED ORDER — IPRATROPIUM-ALBUTEROL 0.5-2.5 (3) MG/3ML IN SOLN
3.0000 mL | Freq: Once | RESPIRATORY_TRACT | Status: AC
  Administered 2014-02-28: 3 mL via RESPIRATORY_TRACT

## 2014-02-28 MED ORDER — GUAIFENESIN-CODEINE 100-10 MG/5ML PO SYRP: 5.0000 mL | ORAL_SOLUTION | Freq: Three times a day (TID) | ORAL | Status: DC | PRN

## 2014-02-28 MED ORDER — METHYLPREDNISOLONE ACETATE 40 MG/ML IJ SUSP: 80.0000 mg | Freq: Once | INTRAMUSCULAR | Status: DC

## 2014-02-28 NOTE — Telephone Encounter (Signed)
Please advise patient to see NP as recommended.

## 2014-02-28 NOTE — Telephone Encounter (Signed)
Received call from [facility], nurse with [facility], who request consult with PCP due to patient calling again and declining to adhere to advise given.  I consulted with Padonda, who reviewed note and patient's chart.  Padonda recommended patient contact Pulmonologist for evaluation.

## 2014-02-28 NOTE — Telephone Encounter (Signed)
Patient Information:  Caller Name: Marizol  Phone: [phone]  Patient: Gloria [name], Gloria [name]  Gender: Female  DOB: [birth year]  Age: 63 Years  PCP: Roxy Cedar [facility])  Office Follow Up:  Does the office need to follow up with this patient?: No  Instructions For The Office: N/A  RN Note:  Patient calling back in regards to symptoms.  In coversation it was noted that patient has already been triaged see previous note and she is still awaiting follow up.  Patient states that she wants to be seen in the office by [facility] and is not going to go to the emergency room and had called two hours prior and is still pending a follow up call because she wants to come in for a 11:15 appointment.  No response noted to message via epic chart per Thera Flake RN resource nurse.  Called office and spoke with [place] and Dr.Cambell recommended that she follow up with the Pulmonologist for evaluation of symtpoms.  Patient notified and states that she has a 11:15 appointment with Dr. Megan Salon and is on her way to the office.  Will have Dr. Megan Salon evaluate her and then will follow up with Pulmonologist afterwards.  Unsure as to reason for call since patient has appointment and updated Saundrea to notify.  Symptoms  Reason For Call & Symptoms: Calling to inquire about results of previous triage message sent  Reviewed Health History In EMR: N/A  Reviewed Medications In EMR: N/A  Reviewed Allergies In EMR: N/A  Reviewed Surgeries / Procedures: N/A  Date of Onset of Symptoms: 02/28/2014  Guideline(s) Used:  No Protocol Available - Sick Adult  Disposition Per Guideline:   Go to Office Now  Reason For Disposition Reached:   Nursing judgment  Advice Given:  N/A  Patient Will Follow Care Advice:  YES

## 2014-02-28 NOTE — Patient Instructions (Signed)

## 2014-02-28 NOTE — Telephone Encounter (Signed)
Pt aware.

## 2014-02-28 NOTE — Telephone Encounter (Signed)
Please advise Dr. Lake Bells if you are willing to see pt? thanks

## 2014-02-28 NOTE — Progress Notes (Signed)
Subjective:    Patient ID: Gloria [name], female    DOB: 1950-08-23, 63 y.o.   MRN: [id number]  HPI 63 year old AAF, nonsmoker, is in today with c/o cough, congestion, wheezing, x 3 days and worsening. Cough is productive with white phlegm. Has a history of bronchitis and sees Dr. Melvyn Novas but has not seen him in a few months and is requesting a different pulmonologist. Has been using Aerospan inhaler that works.    Review of Systems  Constitutional: Negative.  Negative for fever and chills.  HENT: Negative.   Respiratory: Positive for cough, shortness of breath and wheezing.   Cardiovascular: Negative.   Gastrointestinal: Negative.   Endocrine: Negative.   Genitourinary: Negative.   Musculoskeletal: Negative.   Skin: Negative.   Allergic/Immunologic: Negative.   Neurological: Negative.   Hematological: Negative.   Psychiatric/Behavioral: Negative.   All other systems reviewed and are negative.  Past Medical History  Diagnosis Date  . Depressive disorder, not elsewhere classified   . Other and unspecified hyperlipidemia   . Unspecified essential hypertension   . Migraine, unspecified, without mention of intractable migraine without mention of status migrainosus   . Unspecified urinary incontinence   . Arthritis   . Chronic bronchitis   . Anxiety state, unspecified   . Postmenopausal symptoms   . Panic attacks   . Allergic rhinitis, cause unspecified 03/30/2013  . Borderline diabetes mellitus     a1c 7.0 (07/26/13)     History   Social History  . Marital Status: Divorced    Spouse Name: N/A    Number of Children: 1  . Years of Education: N/A   Occupational History  . Self employed-interior design/flower arrangements    Social History Main Topics  . Smoking status: Never Smoker   . Smokeless tobacco: Never Used  . Alcohol Use: No  . Drug Use: No  . Sexual Activity: Not Currently   Other Topics Concern  . Not on file   Social History Narrative  . No narrative on  file    Past Surgical History  Procedure Laterality Date  . Abdominal hysterectomy  2002  . Dislocated shoulder      right  . Diagnostic laparoscopy    . Tumor right ankle      Family History  Problem Relation Age of Onset  . Arthritis      family history  . Hypertension      grandparent  . Colon cancer Maternal Grandfather   . Asthma Son     Allergies  Allergen Reactions  . Lisinopril     cough  . Codeine Itching  . Compazine     Stroke like symptoms  . Haloperidol Decanoate Other (See Comments)    Extrapyramidal syndrome  . Penicillins Nausea And Vomiting    Current Outpatient Prescriptions on File Prior to Visit  Medication Sig Dispense Refill  . albuterol (PROVENTIL) (2.5 MG/3ML) 0.083% nebulizer solution Take 3 mLs (2.5 mg total) by nebulization every 4 (four) hours as needed for wheezing.  25 vial  2  . ALPRAZolam (XANAX) 1 MG tablet Take 1 tablet (1 mg total) by mouth 2 (two) times daily as needed for anxiety or sleep. Do not fill more than once every 30 days  60 tablet  5  . Cholecalciferol (VITAMIN D-3) 5000 UNITS TABS Take 1 tablet by mouth daily.      . [employer] Lancets MISC USE TO CHECK BLOOD GLUCOSE ONCE DAILY FASTING  200 each  0  .  doxycycline (VIBRA-TABS) 100 MG tablet Take 1 tablet (100 mg total) by mouth 2 (two) times daily.  20 tablet  0  . esomeprazole (NEXIUM) 40 MG capsule Take 1 capsule (40 mg total) by mouth daily at 12 noon.      . Flunisolide HFA (AEROSPAN) 80 MCG/ACT AERS Inhale 2 puffs into the lungs 2 (two) times daily.  1 Inhaler  0  . glucose blood (ACCU-CHEK AVIVA) test strip Use as instructed  100 each  12  . hydrochlorothiazide (HYDRODIURIL) 50 MG tablet Take 1 tablet (50 mg total) by mouth daily.  30 tablet  6  . HYDROcodone-acetaminophen (NORCO) 7.5-325 MG per tablet Take 1 tablet by mouth daily as needed for moderate pain.  30 tablet  0  . ibuprofen (ADVIL,MOTRIN) 600 MG tablet Take 1 tablet (600 mg total) by mouth every 8 (eight) hours as  needed for pain.  60 tablet  1  . irbesartan (AVAPRO) 150 MG tablet Take 1 tablet (150 mg total) by mouth daily.  30 tablet  6  . mometasone-formoterol (DULERA) 200-5 MCG/ACT AERO Inhale 2 puffs into the lungs 2 (two) times daily.  3 Inhaler  3  . Multiple Vitamins-Minerals (CENTRUM SILVER PO) Take 1 capsule by mouth daily.        Marland Kitchen zolpidem (AMBIEN) 10 MG tablet Take 5 mg by mouth at bedtime as needed for sleep.      . Cyanocobalamin (VITAMIN B 12 PO) Take 1 capsule by mouth daily.         No current facility-administered medications on file prior to visit.    BP 142/88  Pulse 106  Wt 209 lb (94.802 kg)  SpO2 96%chart     Objective:   Physical Exam  Constitutional: She is oriented to person, place, and time. She appears well-developed and well-nourished.  HENT:  Right Ear: External ear normal.  Left Ear: External ear normal.  Nose: Nose normal.  Mouth/Throat: Oropharynx is clear and moist.  Neck: Normal range of motion. Neck supple.  Cardiovascular: Normal rate, regular rhythm and normal heart sounds.   Pulmonary/Chest: She has wheezes. She has no rales.  Abdominal: Soft. Bowel sounds are normal.  Musculoskeletal: Normal range of motion.  Neurological: She is alert and oriented to person, place, and time.  Skin: Skin is warm and dry.  Psychiatric: She has a normal mood and affect.     Deuoneb given in the office today.      Assessment & Plan:  Janera was seen today for bronchitis.  Diagnoses and associated orders for this visit:  Acute bronchitis, unspecified organism - methylPREDNISolone acetate (DEPO-MEDROL) injection 80 mg; Inject 2 mLs (80 mg total) into the muscle once. - ipratropium-albuterol (DUONEB) 0.5-2.5 (3) MG/3ML nebulizer solution 3 mL; Take 3 mLs by nebulization once.  Type II or unspecified type diabetes mellitus without mention of complication, not stated as uncontrolled  Unspecified essential hypertension  Other Orders - guaiFENesin-codeine  (CHERATUSSIN AC) 100-10 MG/5ML syrup; Take 5 mLs by mouth 3 (three) times daily as needed.   Call the office with any questions or concerns. Recheck as scheduled and as needed.

## 2014-02-28 NOTE — Telephone Encounter (Signed)
Ok with me but note she declined to see Tammy NP to help her straighten out her meds which included helping her with costs/ affordabillity/ insurance restrictions and that should still be the next step before she comes back to see any of our pulmonologists

## 2014-02-28 NOTE — Telephone Encounter (Signed)
Pt in the office for appointment today.

## 2014-02-28 NOTE — Telephone Encounter (Signed)
Dr Melvyn Novas, please advise if okay to switch to a different provider here thanks!

## 2014-02-28 NOTE — Progress Notes (Signed)
Pre visit review using our clinic review tool, if applicable. No additional management support is needed unless otherwise documented below in the visit note. 

## 2014-02-28 NOTE — Telephone Encounter (Signed)
Dr. Abby Potash CAMPBELL would like this pt to be seen by a different physician for Acute bronchitis. A referral is in the system.  Once approved please call the patient to make appt.

## 2014-02-28 NOTE — Telephone Encounter (Signed)
Patient Information:  Caller Name: [place]  Phone: [phone]  Patient: Gloria [name], Gloria [name]  Gender: Female  DOB: 1951/05/08  Age: 63 Years  PCP: Roxy Cedar [facility])  Office Follow Up:  Does the office need to follow up with this patient?: Yes  Instructions For The Office: Please call and advise pt.   Symptoms  Reason For Call & Symptoms: Pt has had a cough x 7 days. No fever. Pt has been wheezing when she wakes up in the morning. She uses her Albuterol inhaler QD and it has been helping so far but she used the last of her inhaler last night. She has no more samples and none at the pharmacy. Pt has an audible wheeze this am/she is speaking in sentences but tells RN she is "tired" when she moves around.   Reviewed Health History In EMR: Yes  Reviewed Medications In EMR: Yes  Reviewed Allergies In EMR: Yes  Reviewed Surgeries / Procedures: Yes  Date of Onset of Symptoms: 02/21/2014  Guideline(s) Used:  Asthma Attack  Disposition Per Guideline:   Go to Office Now  Reason For Disposition Reached:   Moderate asthma attack (e.g., SOB at rest, speaks in phrases, audible wheezes) and not resolved after 2 nebulizer or inhaler treatments given 20 minutes apart  Advice Given:  N/A  Patient Refused Recommendation:  Patient Will Follow Up With Office Later  There are no appts with Harlene Salts. Pt tells RN she will only see this Provider. She refuses ED (too many germs). Wants to get someone to "bring her over" now and Pandonda will fit her in. RN offered to call her in another inhaler but pt states she only gets samples.

## 2014-03-09 ENCOUNTER — Telehealth: Payer: Self-pay | Admitting: Internal Medicine

## 2014-03-09 NOTE — Telephone Encounter (Signed)
i would start with Gloria [name] if pt's willing to come in (this was my original rec that wasn't followed).  If she delcines, then she should see first available as new consult

## 2014-03-09 NOTE — Telephone Encounter (Signed)
Fine with me though she responded per previous notes to cough regimen in 08/2013 with plans to f/u with Tammy NP first, not me, and is on inhaled dpi steroid now that causes cough in many pts

## 2014-03-09 NOTE — Telephone Encounter (Signed)
Dr. Abby Potash CAMPBELL would like this pt to be seen by a different physician for Acute bronchitis. A referral is in the system.  Once approved please call the patient to make appt.  Previous message on 8/31 was closed.

## 2014-03-09 NOTE — Telephone Encounter (Signed)
MW, any other MD you would suggest patient seeing? This way Triage knows who to send phone note to. Thanks

## 2014-03-09 NOTE — Telephone Encounter (Signed)
Dr Roxy Cedar requesting a switch in physicians for Acute Bronchitis.  Pt currently established with Dr Melvyn Novas.  Please advise Dr Melvyn Novas if okay to switch to another physician--referral from Dr Megan Salon in [facility] Any rec's for new provider?  Please advise Dr Melvyn Novas. Thanks.

## 2014-03-10 NOTE — Telephone Encounter (Signed)
Called pt. appt scheduled to see RB. Nothing further needed

## 2014-03-10 NOTE — Telephone Encounter (Signed)
After 15 minutes on the phone with patient-- pt decided that she does NOT want to see TP  Pt decided to schedule NEW CONSULT appt with another physician as she does not want to be under the care of Dr Melvyn Novas any longer.  Pt advised that this will be a Consult appt with whoever she sees as she will be a new patient to that specific physician. Pt advised that she will need to arrive 15-20 mins early to fill out paperwork. Pt advised to bring all meds with her to her visit.   Pt wants to be scheduled with RB 03/21/14 at 330 for new consult. Slot held on schedule.  Advised pt that we have a protocol to follow and will contact her back to confirm this appt.   Pleas advise Dr Lamonte Sakai if you are okay with taking on the care of this patient.

## 2014-03-10 NOTE — Telephone Encounter (Signed)
Yes this is ok 

## 2014-03-21 ENCOUNTER — Encounter: Payer: Self-pay | Admitting: Emergency Medicine

## 2014-03-21 ENCOUNTER — Encounter (INDEPENDENT_AMBULATORY_CARE_PROVIDER_SITE_OTHER): Payer: Self-pay

## 2014-03-21 ENCOUNTER — Ambulatory Visit (INDEPENDENT_AMBULATORY_CARE_PROVIDER_SITE_OTHER): Payer: No Typology Code available for payment source | Admitting: Emergency Medicine

## 2014-03-21 ENCOUNTER — Telehealth: Payer: Self-pay | Admitting: Emergency Medicine

## 2014-03-21 VITALS — BP 112/64 | HR 85 | Temp 98.0°F | Ht 65.5 in | Wt 206.6 lb

## 2014-03-21 DIAGNOSIS — J45909 Unspecified asthma, uncomplicated: Secondary | ICD-10-CM

## 2014-03-21 DIAGNOSIS — J383 Other diseases of vocal cords: Secondary | ICD-10-CM

## 2014-03-21 MED ORDER — LORATADINE 10 MG PO TABS: 10.0000 mg | ORAL_TABLET | Freq: Every day | ORAL | Status: DC

## 2014-03-21 MED ORDER — LEVOFLOXACIN 500 MG PO TABS: 500.0000 mg | ORAL_TABLET | Freq: Every day | ORAL | Status: DC

## 2014-03-21 MED ORDER — FLUTICASONE PROPIONATE 50 MCG/ACT NA SUSP: 2.0000 | Freq: Every day | NASAL | Status: DC

## 2014-03-21 MED ORDER — METHYLPREDNISOLONE ACETATE 80 MG/ML IJ SUSP
120.0000 mg | Freq: Once | INTRAMUSCULAR | Status: AC
  Administered 2014-03-21: 120 mg via INTRAMUSCULAR

## 2014-03-21 MED ORDER — ESOMEPRAZOLE MAGNESIUM 40 MG PO CPDR: 40.0000 mg | DELAYED_RELEASE_CAPSULE | Freq: Every day | ORAL | Status: DC

## 2014-03-21 MED ORDER — ALBUTEROL SULFATE (2.5 MG/3ML) 0.083% IN NEBU
2.5000 mg | INHALATION_SOLUTION | Freq: Once | RESPIRATORY_TRACT | Status: AC
  Administered 2014-03-21: 2.5 mg via RESPIRATORY_TRACT

## 2014-03-21 MED ORDER — FEXOFENADINE HCL 180 MG PO TABS: 180.0000 mg | ORAL_TABLET | Freq: Every day | ORAL | Status: DC

## 2014-03-21 MED ORDER — HYDROCOD POLST-CHLORPHEN POLST 10-8 MG/5ML PO LQCR: 5.0000 mL | Freq: Two times a day (BID) | ORAL | Status: DC | PRN

## 2014-03-21 NOTE — Assessment & Plan Note (Signed)
Presentation and complaints consistent with classic VCD. She still hasn't had PFT (Dr Melvyn Novas ordered them). She wants to try abx, doesn't like to be on prednisone due to wt gain. She is willing to take depomedrol. I believe we need to treat GERd and allergies empirically, practice voice rest and suppress cough. Will restart nexium, treat allergies as below.

## 2014-03-21 NOTE — Patient Instructions (Addendum)
Please stop Aerospan.  Continue to have albuterol available to use if needed for shortness of breath.  We will treat you with levaquin 500mg  daily for 7 day Take tussionex as directed.  Start fexofenadine 180mg  daily Start fluticasone nasal spray, 2 sprays each nostril daily Start nexium 40mg  twice a day for 10 days, then decrease to once a day We will perform full breathing testing at your next visit.  Follow with Dr Lamonte Sakai in 1 month with full PFT.

## 2014-03-21 NOTE — Progress Notes (Signed)
Subjective:    Patient ID: Gloria [name], female    DOB: 31-Oct-1950, 63 y.o.   MRN: [id number]  HPI 63 yo woman, never smoker, hx of anxiety, depression, HTN. She carried a hx childhood asthma as well. She has seen Dr Melvyn Novas before. She is seen for progressive cough and shortness of breath. This seemed to start about 2 years ago. She coughs with talking, laughing. Frequent hack > usually non-productive but sometimes thick white, spots of yellow.     Review of Systems  Past Medical History  Diagnosis Date  . Depressive disorder, not elsewhere classified   . Other and unspecified hyperlipidemia   . Unspecified essential hypertension   . Migraine, unspecified, without mention of intractable migraine without mention of status migrainosus   . Unspecified urinary incontinence   . Arthritis   . Chronic bronchitis   . Anxiety state, unspecified   . Postmenopausal symptoms   . Panic attacks   . Allergic rhinitis, cause unspecified 03/30/2013  . Borderline diabetes mellitus     a1c 7.0 (07/26/13)      Family History  Problem Relation Age of Onset  . Arthritis      family history  . Hypertension      grandparent  . Colon cancer Maternal Grandfather   . Asthma Son      History   Social History  . Marital Status: Divorced    Spouse Name: N/A    Number of Children: 1  . Years of Education: N/A   Occupational History  . Self employed-interior design/flower arrangements    Social History Main Topics  . Smoking status: Never Smoker   . Smokeless tobacco: Never Used  . Alcohol Use: No  . Drug Use: No  . Sexual Activity: Not Currently   Other Topics Concern  . Not on file   Social History Narrative  . No narrative on file     Allergies  Allergen Reactions  . Lisinopril     cough  . Codeine Itching  . Compazine     Stroke like symptoms  . Haloperidol Decanoate Other (See Comments)    Extrapyramidal syndrome  . Penicillins Nausea And Vomiting     Outpatient  Prescriptions Prior to Visit  Medication Sig Dispense Refill  . albuterol (PROVENTIL) (2.5 MG/3ML) 0.083% nebulizer solution Take 3 mLs (2.5 mg total) by nebulization every 4 (four) hours as needed for wheezing.  25 vial  2  . ALPRAZolam (XANAX) 1 MG tablet Take 1 tablet (1 mg total) by mouth 2 (two) times daily as needed for anxiety or sleep. Do not fill more than once every 30 days  60 tablet  5  . Cholecalciferol (VITAMIN D-3) 5000 UNITS TABS Take 1 tablet by mouth daily.      . [employer] Lancets MISC USE TO CHECK BLOOD GLUCOSE ONCE DAILY FASTING  200 each  0  . Cyanocobalamin (VITAMIN B 12 PO) Take 1 capsule by mouth daily.        . Flunisolide HFA (AEROSPAN) 80 MCG/ACT AERS Inhale 2 puffs into the lungs 2 (two) times daily.  1 Inhaler  0  . glucose blood (ACCU-CHEK AVIVA) test strip Use as instructed  100 each  12  . hydrochlorothiazide (HYDRODIURIL) 50 MG tablet Take 1 tablet (50 mg total) by mouth daily.  30 tablet  6  . HYDROcodone-acetaminophen (NORCO) 7.5-325 MG per tablet Take 1 tablet by mouth daily as needed for moderate pain.  30 tablet  0  . ibuprofen (  ADVIL,MOTRIN) 600 MG tablet Take 1 tablet (600 mg total) by mouth every 8 (eight) hours as needed for pain.  60 tablet  1  . irbesartan (AVAPRO) 150 MG tablet Take 1 tablet (150 mg total) by mouth daily.  30 tablet  6  . doxycycline (VIBRA-TABS) 100 MG tablet Take 1 tablet (100 mg total) by mouth 2 (two) times daily.  20 tablet  0  . esomeprazole (NEXIUM) 40 MG capsule Take 1 capsule (40 mg total) by mouth daily at 12 noon.      Marland Kitchen guaiFENesin-codeine (CHERATUSSIN AC) 100-10 MG/5ML syrup Take 5 mLs by mouth 3 (three) times daily as needed.  120 mL  0  . mometasone-formoterol (DULERA) 200-5 MCG/ACT AERO Inhale 2 puffs into the lungs 2 (two) times daily.  3 Inhaler  3  . Multiple Vitamins-Minerals (CENTRUM SILVER PO) Take 1 capsule by mouth daily.        Marland Kitchen zolpidem (AMBIEN) 10 MG tablet Take 5 mg by mouth at bedtime as needed for sleep.        No facility-administered medications prior to visit.         Objective:   Physical Exam Filed Vitals:   03/21/14 1538  BP: 112/64  Pulse: 85  Temp: 98 F (36.7 C)  TempSrc: Oral  Height: 5' 5.5" (1.664 m)  Weight: 206 lb 9.6 oz (93.713 kg)  SpO2: 99%   Gen: Obese woman, in no distress, anxious  ENT: No lesions,  mouth clear,  oropharynx clear, no postnasal drip  Neck: No JVD, no TMG, no carotid bruits, LOUD insp and exp stridor  Lungs: No use of accessory muscles, difficult to ascertain whether there is true wheeze due to referred UA noise.   Cardiovascular: RRR, heart sounds normal, no murmur or gallops, no peripheral edema  Musculoskeletal: No deformities, no cyanosis or clubbing  Neuro: alert, non focal  Skin: Warm, no lesions or rashes     Assessment & Plan:  Vocal cord dysfunction Presentation and complaints consistent with classic VCD. She still hasn't had PFT (Dr Melvyn Novas ordered them). She wants to try abx, doesn't like to be on prednisone due to wt gain. She is willing to take depomedrol. I believe we need to treat GERd and allergies empirically, practice voice rest and suppress cough. Will restart nexium, treat allergies as below.

## 2014-03-21 NOTE — Telephone Encounter (Signed)
Called spoke with pt. She reports the depo medrol is already helping her. She wants to make sure she is on everything she needs to be on. She is going to pick up ABX. If she worsens or does not get better she will [place]. Nothing further needed

## 2014-03-29 ENCOUNTER — Other Ambulatory Visit (INDEPENDENT_AMBULATORY_CARE_PROVIDER_SITE_OTHER): Payer: No Typology Code available for payment source

## 2014-03-29 ENCOUNTER — Ambulatory Visit (INDEPENDENT_AMBULATORY_CARE_PROVIDER_SITE_OTHER)
Admission: RE | Admit: 2014-03-29 | Discharge: 2014-03-29 | Disposition: A | Discharge status: Home / Self Care | Payer: No Typology Code available for payment source | Source: Ambulatory Visit | Attending: Internal Medicine | Admitting: Internal Medicine

## 2014-03-29 ENCOUNTER — Encounter: Payer: Self-pay | Admitting: Internal Medicine

## 2014-03-29 ENCOUNTER — Ambulatory Visit (INDEPENDENT_AMBULATORY_CARE_PROVIDER_SITE_OTHER): Payer: No Typology Code available for payment source | Admitting: Internal Medicine

## 2014-03-29 ENCOUNTER — Telehealth: Payer: Self-pay | Admitting: Emergency Medicine

## 2014-03-29 VITALS — BP 140/80 | HR 107 | Temp 97.8°F | Ht 65.0 in | Wt 199.0 lb

## 2014-03-29 DIAGNOSIS — R0600 Dyspnea, unspecified: Secondary | ICD-10-CM

## 2014-03-29 DIAGNOSIS — R05 Cough: Secondary | ICD-10-CM

## 2014-03-29 DIAGNOSIS — R0989 Other specified symptoms and signs involving the circulatory and respiratory systems: Secondary | ICD-10-CM

## 2014-03-29 DIAGNOSIS — R0609 Other forms of dyspnea: Secondary | ICD-10-CM

## 2014-03-29 DIAGNOSIS — R059 Cough, unspecified: Secondary | ICD-10-CM

## 2014-03-29 DIAGNOSIS — J45909 Unspecified asthma, uncomplicated: Secondary | ICD-10-CM

## 2014-03-29 DIAGNOSIS — R058 Other specified cough: Secondary | ICD-10-CM | POA: Insufficient documentation

## 2014-03-29 LAB — CBC WITH DIFFERENTIAL/PLATELET
Basophils Absolute: 0.1 10*3/uL (ref 0.0–0.1)
Basophils Relative: 0.5 % (ref 0.0–3.0)
Eosinophils Absolute: 1.2 10*3/uL — ABNORMAL HIGH (ref 0.0–0.7)
Eosinophils Relative: 7.1 % — ABNORMAL HIGH (ref 0.0–5.0)
HCT: 38.2 % (ref 36.0–46.0)
Hemoglobin: 12.6 g/dL (ref 12.0–15.0)
Lymphocytes Relative: 17.6 % (ref 12.0–46.0)
Lymphs Abs: 2.9 10*3/uL (ref 0.7–4.0)
MCHC: 33.1 g/dL (ref 30.0–36.0)
MCV: 85.7 fl (ref 78.0–100.0)
Monocytes Absolute: 0.6 10*3/uL (ref 0.1–1.0)
Monocytes Relative: 3.5 % (ref 3.0–12.0)
Neutro Abs: 11.6 10*3/uL — ABNORMAL HIGH (ref 1.4–7.7)
Neutrophils Relative %: 71.3 % (ref 43.0–77.0)
Platelets: 449 10*3/uL — ABNORMAL HIGH (ref 150.0–400.0)
RBC: 4.46 Mil/uL (ref 3.87–5.11)
RDW: 14.9 % (ref 11.5–15.5)
WBC: 16.3 10*3/uL — ABNORMAL HIGH (ref 4.0–10.5)

## 2014-03-29 LAB — TSH: TSH: 2.79 u[IU]/mL (ref 0.35–4.50)

## 2014-03-29 LAB — BRAIN NATRIURETIC PEPTIDE: Pro B Natriuretic peptide (BNP): 11 pg/mL (ref 0.0–100.0)

## 2014-03-29 MED ORDER — LEVOFLOXACIN 500 MG PO TABS: 500.0000 mg | ORAL_TABLET | Freq: Every day | ORAL | Status: DC

## 2014-03-29 MED ORDER — ESOMEPRAZOLE MAGNESIUM 40 MG PO CPDR: DELAYED_RELEASE_CAPSULE | ORAL | Status: DC

## 2014-03-29 MED ORDER — PREDNISONE 10 MG PO TABS: ORAL_TABLET | ORAL | Status: DC

## 2014-03-29 MED ORDER — HYDROCODONE-ACETAMINOPHEN 7.5-325 MG PO TABS: 1.0000 | ORAL_TABLET | Freq: Every day | ORAL | Status: DC | PRN

## 2014-03-29 MED ORDER — ALBUTEROL SULFATE (2.5 MG/3ML) 0.083% IN NEBU: 2.5000 mg | INHALATION_SOLUTION | RESPIRATORY_TRACT | Status: DC | PRN

## 2014-03-29 NOTE — Progress Notes (Signed)
Quick Note:  Spoke with pt and notified of results per Dr. Wert. Pt verbalized understanding and denied any questions.  ______ 

## 2014-03-29 NOTE — Assessment & Plan Note (Signed)
Classic Upper airway cough syndrome, so named because it's frequently impossible to sort out how much is  CR/sinusitis with freq throat clearing (which can be related to primary GERD)   vs  causing  secondary (" extra esophageal")  GERD from wide swings in gastric pressure that occur with throat clearing, often  promoting self use of mint and menthol lozenges that reduce the lower esophageal sphincter tone and exacerbate the problem further in a cyclical fashion.   These are the same pts (now being labeled as having "irritable larynx syndrome" by some cough centers) who not infrequently have a history of having failed to tolerate ace inhibitors,  dry powder inhalers or biphosphonates or report having atypical reflux symptoms that don't respond to standard doses of PPI , and are easily confused as having aecopd or asthma flares by even experienced allergists/ pulmonologists.   rec max nexium/ eliminated cyclical cough with hydrocodone and flutter valve and then regroup with Dr Lamonte Sakai or consider admit if can't control symptoms as outpatient as she doesn't appear to have the insight or resources to control these symptoms on her own

## 2014-03-29 NOTE — Assessment & Plan Note (Addendum)
Symptoms are markedly disproportionate to objective findings and not clear this is a lung problem but pt does appear to have difficult airway management issues. DDX of  difficult airways management all start with A and  include Adherence, Ace Inhibitors, Acid Reflux, Active Sinus Disease, Alpha 1 Antitripsin deficiency, Anxiety masquerading as Airways dz,  ABPA,  allergy(esp in young), Aspiration (esp in elderly), Adverse effects of DPI,  Active smokers, plus two Bs  = Bronchiectasis and Beta blocker use..and one C= CHF  Adherence is always the initial "prime suspect" and is a multilayered concern that requires a "trust but verify" approach in every patient - starting with knowing how to use medications, especially inhalers, correctly, keeping up with refills and understanding the fundamental difference between maintenance and prns vs those medications only taken for a very short course and then stopped and not refilled.  - would use a trust but verify approach here and have her bring all meds to all visits  ? Acid (or non-acid) GERD > always difficult to exclude as up to 75% of pts in some series report no assoc GI/ Heartburn symptoms> rec max (24h)  acid suppression and diet restrictions/ reviewed and instructions given in writing.   ? Allergy > allergy profile / depomedrol 120 and Prednisone 10 mg take  4 each am x 2 days,   2 each am x 2 days,  1 each am x 2 days and stop   ? chf > check bnp  ? Active sinus dz >  Levaquin x 7 days >  Consider sinus CT next   ? Anxiety > note onset of symptoms with death of her mother     Each maintenance medication was reviewed in detail including most importantly the difference between maintenance and as needed and under what circumstances the prns are to be used.  Please see instructions for details which were reviewed in writing and the patient given a copy.

## 2014-03-29 NOTE — Patient Instructions (Addendum)
Continue to use the albuterol up to every 4 hours as needed but the goal is to minimize need for this  We will treat you with levaquin 500mg  daily for 7 day Take delsym two tsp every 12 hours and supplement if needed with hydrocodone  up to 2 every 4 hours to suppress the urge to cough. Swallowing water or using ice chips/non mint and menthol containing candies (such as lifesavers or sugarless jolly ranchers) are also effective.  You should rest your voice and avoid activities that you know make you cough.Once you have eliminated the cough for 3 straight days try reducing the hydrocodone first,  then the delsym as tolerated.   fexofenadine 180mg  daily Start fluticasone nasal spray, 2 sprays each nostril daily Start nexium 40mg  Take 30- 60 min before your first and last meals of the day  Prednisone 10 mg take  4 each am x 2 days,   2 each am x 2 days,  1 each am x 2 days and stop  Use the flutter valve as much as possible  GERD (REFLUX)  is an extremely common cause of respiratory symptoms, many times with no significant heartburn at all.    It can be treated with medication, but also with lifestyle changes including avoidance of late meals, excessive alcohol, smoking cessation, and avoid fatty foods, chocolate, peppermint, colas, red wine, and acidic juices such as orange juice.  NO MINT OR MENTHOL PRODUCTS SO NO COUGH DROPS  USE SUGARLESS CANDY INSTEAD (jolley ranchers or Stover's)  NO OIL BASED VITAMINS - use powdered substitutes.  Please remember to go to the lab and x-ray department downstairs for your tests - we will call you with the results when they are available.      Keep follow up  Dr Lamonte Sakai in 1 month with full PFT - if condition worsens in meantime you need to return to here with all medications in hand to see Tammy NP with your drug formulary

## 2014-03-29 NOTE — Progress Notes (Signed)
Subjective:    Patient ID: Gloria [name], female    DOB: 09/15/1950    MRN: [id number]    Brief patient profile:  40 yobf sickly child (? Pna, whooping cough, asthma) never full aerobic tolerance then better ages 26- 54 and no daily rx or need to go doctor and self rx with otcs like nyquil then much  Worse since 2004-10-14 when mother died and since then freq prns including  albuterol neb worse since 10/15/11 and needing daily dulera 200 (though admits not consistent with it) referred 08/02/2013 by Dr Gloria [name] for cough.    History of Present Illness  08/02/2013 1st [facility] office visit/ Gloria [name] cc min prod (always white mucus) cough every day esp in am x sev years better on prednisone but "gained too much wt" from baseline 180  - wakes up daily 3 am smothering / hacking > goes to [facility] and uses dulera and helps and props up head and goes back to bed and then wakes up s flare. Also neb maybe once a month. Worse with pollen exp x 2 years  rec Pepcid ac 20 mg one at bedtime until return. GERD diet  Dulera 200 2bid Work on hfa   09/08/2013 acute  ov/[name] re: asthma/ cough > sob Chief Complaint  Patient presents with  . Acute Visit    Pt c/o increased cough, SOB and wheezing x 4 days. Cough is prod with white sputum. The past few days she has been waking up SOB and having to use albuterol nebs.   no sob p nebs/ still violent cough though no excess mucus rec Depromedrol 120 mg today Nexium  40 mg   Take 30-60 min before first meal of the day and Pepcid 20 mg one bedtime until return to office - this is the best way to tell whether stomach acid is contributing to your problem.   Stop hyzar and use valsartan 160/ 25 one daily  For short of breath > nebulizer every 4 hours For cough > hydrocone cough syrup. Once cough better use dulera Take 2 puffs first thing in am and then another 2 puffs about 12 hours later.  Please schedule a follow up office visit in 4 weeks, sooner if needed with pfts on return >  did not return, request switch docs   03/21/14 Gloria [name] ov rec Please stop Aerospan.  Continue to have albuterol available to use if needed for shortness of breath.  We will treat you with levaquin 500mg  daily for 7 day Take tussionex as directed.  Start fexofenadine 180mg  daily Start fluticasone nasal spray, 2 sprays each nostril daily Start nexium 40mg  twice a day for 10 days, then decrease to once a day    03/29/2014 f/u ov/Gloria [name] re: pseudoasthma Chief Complaint  Patient presents with  . Acute Visit    Pt c/o increased cough- prod with yellow to brown sputum.  She also wheezing and increased SOB. She is using nebulizer with albuterol at least 5 times per day.   she does report improving p depomedrol but only for a few days then gradually back to baseline cough and sob, not clear she followed any of the instructions given.  Mucus turned clear on levaquin then back to brown w/in a few days of stopping it.   No obvious day to day or daytime variabilty or   cp or chest tightness  overt sinus or hb symptoms. No unusual exp hx or h/o childhood pna/ asthma or knowledge of premature birth.  Sleeping ok without nocturnal  or early am exacerbation  of respiratory  c/o's or need for noct saba. Also denies any obvious fluctuation of symptoms with weather or environmental changes or other aggravating or alleviating factors except as outlined above   Current Medications, Allergies, Complete Past Medical History, Past Surgical History, Family History, and Social History were reviewed in [employer] record.  ROS  The following are not active complaints unless bolded sore throat, dysphagia, dental problems, itching, sneezing,  nasal congestion or excess/ purulent secretions, ear ache,   fever, chills, sweats, unintended wt loss, pleuritic or exertional cp, hemoptysis,  orthopnea pnd or leg swelling, presyncope, palpitations, heartburn, abdominal pain, anorexia, nausea, vomiting,  diarrhea  or change in bowel or urinary habits, change in stools or urine, dysuria,hematuria,  rash, arthralgias, visual complaints, headache, numbness weakness or ataxia or problems with walking or coordination,  change in mood/affect or memory.           Objective:   Physical Exam  amb depressed/anxious bf nad with extremely harsh upper airway coughing fits  09/08/2013        215  > 03/29/2014  199 Wt Readings from Last 3 Encounters:  08/02/13 219 lb 9.6 oz (99.61 kg)  07/26/13 219 lb 12.8 oz (99.701 kg)  07/14/13 215 lb 4 oz (97.637 kg)      HEENT: nl dentition, turbinates, and orophanx. Nl external ear canals without cough reflex   NECK :  without JVD/Nodes/TM/ nl carotid upstrokes bilaterally   LUNGS: no acc muscle use,  Very prominent wheeze/ pseudowheeze   CV:  RRR  no s3 or murmur or increase in P2, no edema   ABD:  soft and nontender with nl excursion in the supine position. No bruits or organomegaly, bowel sounds nl  MS:  warm without deformities, calf tenderness, cyanosis or clubbing  SKIN: warm and dry without lesions    NEURO:  alert, approp, no deficits   CXR  03/29/2014 :  No edema or consolidation.       Chemistry      Component Value Date/Time   NA 137 01/21/2014 1619   K 3.7 01/21/2014 1619   CL 100 01/21/2014 1619   CO2 27 01/21/2014 1619   BUN 10 01/21/2014 1619   CREATININE 0.74 01/21/2014 1619   CREATININE 0.7 07/26/2013 1538      Component Value Date/Time   CALCIUM 9.3 01/21/2014 1619   ALKPHOS 92 01/21/2014 1619   AST 19 01/21/2014 1619   ALT 18 01/21/2014 1619   BILITOT 0.4 01/21/2014 1619        Recent Labs Lab 03/29/14 1455  HGB 12.6  HCT 38.2  WBC 16.3*  PLT 449.0*     Lab Results  Component Value Date   TSH 2.79 03/29/2014     Lab Results  Component Value Date   PROBNP 11.0 03/29/2014              Assessment & Plan:

## 2014-03-29 NOTE — Telephone Encounter (Signed)
Last seen 03/21/14 Patient Instructions     Please stop Aerospan.  Continue to have albuterol available to use if needed for shortness of breath.  We will treat you with levaquin 500mg  daily for 7 day  Take tussionex as directed.  Start fexofenadine 180mg  daily  Start fluticasone nasal spray, 2 sprays each nostril daily  Start nexium 40mg  twice a day for 10 days, then decrease to once a day  We will perform full breathing testing at your next visit.  Follow with Dr Lamonte Sakai in 1 month with full PFT.    Pt c/o increased cough with mucus production; dark brown in color, wheezing and SOB. No improvement since last OV. Pt states that she has been following rec's from last OV and is getting worse. Pt has completed abx and is still taking all other meds.  Pt scheduled for OV with MW today at 1:45 for Acute Visit.  Nothing further needed.

## 2014-03-29 NOTE — Assessment & Plan Note (Signed)
No evidence of other mechanism for dyspnea

## 2014-03-30 ENCOUNTER — Other Ambulatory Visit: Payer: Self-pay | Admitting: Internal Medicine

## 2014-03-30 MED ORDER — FLUTTER DEVI: Status: DC

## 2014-03-31 ENCOUNTER — Encounter: Payer: Self-pay | Admitting: Internal Medicine

## 2014-03-31 LAB — ALLERGY FULL PROFILE
Allergen, D pternoyssinus,d7: <0.1 kU/L
Allergen,Goose feathers, e70: <0.1 kU/L
Alternaria Alternata: <0.1 kU/L
Aspergillus fumigatus, m3: <0.1 kU/L
Bahia Grass: <0.1 kU/L
Bermuda Grass: <0.1 kU/L
Box Elder IgE: <0.1 kU/L
Candida Albicans: <0.1 kU/L
Cat Dander: <0.1 kU/L
Common Ragweed: <0.1 kU/L
Curvularia lunata: <0.1 kU/L
D. farinae: <0.1 kU/L
Dog Dander: <0.1 kU/L
Elm IgE: <0.1 kU/L
Fescue: <0.1 kU/L
G005 Rye, Perennial: <0.1 kU/L
G009 Red Top: <0.1 kU/L
Goldenrod: <0.1 kU/L
Helminthosporium halodes: <0.1 kU/L
House Dust Hollister: <0.1 kU/L
IgE (Immunoglobulin E), Serum: 109 kU/L (ref <115)
Lamb's Quarters: <0.1 kU/L
Oak: <0.1 kU/L
Plantain: <0.1 kU/L
Stemphylium Botryosum: <0.1 kU/L
Sycamore Tree: <0.1 kU/L
Timothy Grass: <0.1 kU/L

## 2014-03-31 NOTE — Progress Notes (Signed)
Quick Note:  Spoke with pt and notified of results per Dr. Wert. Pt verbalized understanding and denied any questions.  ______ 

## 2014-04-07 ENCOUNTER — Telehealth: Payer: Self-pay | Admitting: Emergency Medicine

## 2014-04-07 NOTE — Telephone Encounter (Signed)
I think before we commit her to paying for it or to doing the PA we need to know if she has benefited in any way from it. Ask her if it has helped, or set up OV so we can discuss. She needs to take it for at least a month for [place] to decide if it has done any good.

## 2014-04-07 NOTE — Telephone Encounter (Signed)
Called spoke with pt. She reports she did not feel the nexium helped her at all. She decided to take Rolaids and pepcid, her symptoms improved. Cough is much better and feels better in general.  Pt is scheduled for PFT and OV with RB 10/27.  Will forward to RB as an [place]

## 2014-04-07 NOTE — Telephone Encounter (Signed)
Pt was seen by MW on 9/29 and given rx for the nexium,  Form received that this medication is not covered by her insurance.  Do you want to change this medication or proceed with the PA for the nexium.  Please advise thanks  Allergies  Allergen Reactions  . Lisinopril     cough  . Codeine Itching  . Compazine     Stroke like symptoms  . Haloperidol Decanoate Other (See Comments)    Extrapyramidal syndrome  . Penicillins Nausea And Vomiting    Current Outpatient Prescriptions on File Prior to Visit  Medication Sig Dispense Refill  . albuterol (PROVENTIL) (2.5 MG/3ML) 0.083% nebulizer solution Take 3 mLs (2.5 mg total) by nebulization every 4 (four) hours as needed for wheezing.  25 vial  2  . ALPRAZolam (XANAX) 1 MG tablet Take 1 tablet (1 mg total) by mouth 2 (two) times daily as needed for anxiety or sleep. Do not fill more than once every 30 days  60 tablet  5  . Cholecalciferol (VITAMIN D-3) 5000 UNITS TABS Take 1 tablet by mouth daily.      . [facility] Lancets MISC USE TO CHECK BLOOD GLUCOSE ONCE DAILY FASTING  200 each  0  . Cyanocobalamin (VITAMIN B 12 PO) Take 1 capsule by mouth daily.        Marland Kitchen esomeprazole (NEXIUM) 40 MG capsule Take 30- 60 min before your first and last meals of the day  60 capsule  5  . fexofenadine (ALLEGRA) 180 MG tablet Take 1 tablet (180 mg total) by mouth daily.  30 tablet  5  . fluticasone (FLONASE) 50 MCG/ACT nasal spray Place 2 sprays into both nostrils daily.  16 g  5  . glucose blood (ACCU-CHEK AVIVA) test strip Use as instructed  100 each  12  . hydrochlorothiazide (HYDRODIURIL) 50 MG tablet Take 1 tablet (50 mg total) by mouth daily.  30 tablet  6  . HYDROcodone-acetaminophen (NORCO) 7.5-325 MG per tablet Take 1 tablet by mouth daily as needed for moderate pain.  40 tablet  0  . ibuprofen (ADVIL,MOTRIN) 600 MG tablet Take 1 tablet (600 mg total) by mouth every 8 (eight) hours as needed for pain.  60 tablet  1  . irbesartan (AVAPRO) 150 MG tablet Take 1  tablet (150 mg total) by mouth daily.  30 tablet  6  . levofloxacin (LEVAQUIN) 500 MG tablet Take 1 tablet (500 mg total) by mouth daily.  7 tablet  0  . predniSONE (DELTASONE) 10 MG tablet Take  4 each am x 2 days,   2 each am x 2 days,  1 each am x 2 days and stop  14 tablet  0  . Respiratory Therapy Supplies (FLUTTER) DEVI Use as directed  1 each  0   No current facility-administered medications on file prior to visit.

## 2014-04-08 NOTE — Telephone Encounter (Signed)
lmtcb X1 to relay recs.  

## 2014-04-08 NOTE — Telephone Encounter (Signed)
Pt aware. Jennifer Castillo, CMA  

## 2014-04-08 NOTE — Telephone Encounter (Signed)
Cancel the nexium. We can revisit adding a PPI at our next visit

## 2014-04-20 ENCOUNTER — Telehealth: Payer: Self-pay | Admitting: Emergency Medicine

## 2014-04-20 MED ORDER — DOXYCYCLINE HYCLATE 100 MG PO TABS: 100.0000 mg | ORAL_TABLET | Freq: Two times a day (BID) | ORAL | Status: DC

## 2014-04-20 MED ORDER — PREDNISONE 10 MG PO TABS: ORAL_TABLET | ORAL | Status: DC

## 2014-04-20 NOTE — Telephone Encounter (Signed)
Pt has upcoming appt with Dr Lamonte Sakai 04/26/14-- pt to keep this appt Aware that rx's called into pharmacy Nothing further needed.

## 2014-04-20 NOTE — Telephone Encounter (Signed)
Not clear to me that she needs to be treated again at this time. Suspect this is a chronic process, not acute. I am willing to treat again with doxy 100 bid x 7 days, pred taper > Take 40mg  daily for 3 days, then 30mg  daily for 3 days, then 20mg  daily for 3 days, then 10mg  daily for 3 days, then stop. She needs an OV to discuss the chronic factors that are keeping her sick.

## 2014-04-20 NOTE — Telephone Encounter (Signed)
Spoke with pt-- c/o cough with brown/Zakia Sainato mucus, wheezing, SOB x 2 weeks- worsening Denies chest tightness/CP Pt seen by [facility] 03/29/14 Pt states that she has completed all of cough regimen, abx and prednisone. Pt requests a stronger abx and 2nd course of Pred be called in as she is not improving.  Pt states that she does not have the money to come in and be seen.  Patient Instructions 03/29/14     Continue to use the albuterol up to every 4 hours as needed but the goal is to minimize need for this  We will treat you with levaquin 500mg  daily for 7 day  Take delsym two tsp every 12 hours and supplement if needed with hydrocodone up to 2 every 4 hours to suppress the urge to cough. Swallowing water or using ice chips/non mint and menthol containing candies (such as lifesavers or sugarless jolly ranchers) are also effective. You should rest your voice and avoid activities that you know make you cough.Once you have eliminated the cough for 3 straight days try reducing the hydrocodone first, then the delsym as tolerated.  fexofenadine 180mg  daily  Start fluticasone nasal spray, 2 sprays each nostril daily  Start nexium 40mg  Take 30- 60 min before your first and last meals of the day  Prednisone 10 mg take 4 each am x 2 days, 2 each am x 2 days, 1 each am x 2 days and stop .Marland Kitchen...   Pt would like something be called into [facility]. Allergies  Allergen Reactions  . Lisinopril     cough  . Codeine Itching  . Compazine     Stroke like symptoms  . Haloperidol Decanoate Other (See Comments)    Extrapyramidal syndrome  . Penicillins Nausea And Vomiting   Please advise Dr Lamonte Sakai. Thanks.

## 2014-04-26 ENCOUNTER — Encounter: Payer: Self-pay | Admitting: Emergency Medicine

## 2014-04-26 ENCOUNTER — Ambulatory Visit (INDEPENDENT_AMBULATORY_CARE_PROVIDER_SITE_OTHER): Payer: No Typology Code available for payment source | Admitting: Emergency Medicine

## 2014-04-26 ENCOUNTER — Other Ambulatory Visit: Payer: Self-pay | Admitting: *Deleted

## 2014-04-26 VITALS — BP 150/88 | HR 137 | Temp 97.3°F | Ht 65.0 in | Wt 203.0 lb

## 2014-04-26 DIAGNOSIS — J45909 Unspecified asthma, uncomplicated: Secondary | ICD-10-CM

## 2014-04-26 DIAGNOSIS — J383 Other diseases of vocal cords: Secondary | ICD-10-CM

## 2014-04-26 LAB — PULMONARY FUNCTION TEST
DL/VA % pred: 101 %
DL/VA: 4.97 ml/min/mmHg/L
DLCO unc % pred: 70 %
DLCO unc: 17.97 ml/min/mmHg
FEF 25-75 Post: 2.33 L/sec
FEF 25-75 Pre: 2.08 L/sec
FEF2575-%Change-Post: 11 %
FEF2575-%Pred-Post: 116 %
FEF2575-%Pred-Pre: 104 %
FEV1-%Change-Post: 6 %
FEV1-%Pred-Post: 97 %
FEV1-%Pred-Pre: 91 %
FEV1-Post: 2.04 L
FEV1-Pre: 1.92 L
FEV1FVC-%Change-Post: 4 %
FEV1FVC-%Pred-Pre: 102 %
FEV6-%Change-Post: 3 %
FEV6-%Pred-Post: 93 %
FEV6-%Pred-Pre: 90 %
FEV6-Post: 2.41 L
FEV6-Pre: 2.33 L
FEV6FVC-%Pred-Post: 104 %
FEV6FVC-%Pred-Pre: 104 %
FVC-%Change-Post: 2 %
FVC-%Pred-Post: 90 %
FVC-%Pred-Pre: 88 %
FVC-Post: 2.41 L
FVC-Pre: 2.36 L
Post FEV1/FVC ratio: 85 %
Post FEV6/FVC ratio: 100 %
Pre FEV1/FVC ratio: 81 %
Pre FEV6/FVC Ratio: 100 %
RV % pred: 147 %
RV: 3.12 L
TLC % pred: 105 %
TLC: 5.49 L

## 2014-04-26 MED ORDER — HYDROCODONE-ACETAMINOPHEN 7.5-325 MG PO TABS: 1.0000 | ORAL_TABLET | Freq: Every day | ORAL | Status: DC | PRN

## 2014-04-26 MED ORDER — OMEPRAZOLE 40 MG PO CPDR: 40.0000 mg | DELAYED_RELEASE_CAPSULE | Freq: Every day | ORAL | Status: DC

## 2014-04-26 NOTE — Assessment & Plan Note (Addendum)
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   I spent an extended visit, 60 minutes, explaining the PFT results, the existence of UA disease, the contributors, the potential resolutions.   - need to more aggressively treat her contribution factors.  - consider FOB or ENT referral for lary - she wants to defer for now.  - GERD diet - rov 1

## 2014-04-26 NOTE — Progress Notes (Signed)
PFT done today. 

## 2014-04-26 NOTE — Progress Notes (Signed)
   Subjective:    Patient ID: Gloria [name], female    DOB: 1950/12/13, 63 y.o.   MRN: [id number]  HPI 63 yo woman, never smoker, hx of anxiety, depression, HTN. She carried a hx childhood asthma as well. She has seen Dr Melvyn Novas before. She is seen for progressive cough and shortness of breath. This seemed to start about 2 years ago. She coughs with talking, laughing. Frequent hack > usually non-productive but sometimes thick white, spots of yellow.    ROV 04/26/14 -- follow up for cough, dyspnea. She carries a hx of possible asthma. i saw her in 9/'15 and then she was seen 8 days later by Dr Melvyn Novas and treated for an AE. Then she called 10/21 and was treated with pred + doxy again. PFT today >> normal airflows, no BD response, hyperinflated RV, normal DLCO when corrected for Va. She is on loratadine + allegra at the same time. She is still on pred taper. She is not on anything for GERD right now.    Review of Systems      Objective:   Physical Exam Filed Vitals:   04/26/14 1607  BP: 150/88  Pulse: 137  Temp: 97.3 F (36.3 C)  TempSrc: Oral  Height: 5\' 5"  (1.651 m)  Weight: 203 lb (92.08 kg)  SpO2: 95%   Gen: Obese woman, in no distress, anxious  ENT: No lesions,  mouth clear,  oropharynx clear, no postnasal drip  Neck: No JVD, no TMG, no carotid bruits, much improved but still some stridor  Lungs: No use of accessory muscles, improved but still some UA noise on insp  Cardiovascular: RRR, heart sounds normal, no murmur or gallops, no peripheral edema  Musculoskeletal: No deformities, no cyanosis or clubbing  Neuro: alert, non focal  Skin: Warm, no lesions or rashes     Assessment & Plan:  Vocal cord dysfunction Her PFT do not support asthma, suspect her sx are VCD mediated by her allergic rhinitis, probable GERD (she isn';t sure she has this, but she eats tomatoes, drinks caffeine, etc), 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   I spent an extended visit, 60 minutes,  explaining the PFT results, the existence of UA disease, the contributors, the potential resolutions.   - need to more aggressively treat her contribution factors.  - consider FOB or ENT referral for lary - she wants to defer for now.  - GERD diet - rov 1

## 2014-04-26 NOTE — Patient Instructions (Signed)
Please continue your fexofenadine daily.  Stop your loratadine.  Start using your fluticasone nasal spray, 2 sprays every day. Start omeprazole 40mg  daily, every day Your breathing testing does not show evidence for asthma. You have a problem with severe upper airway and vocal cord inflammation.  You may still use your albuterol if needed for wheezing, but you may find that it does not completely help you.  Finish your prednisone as ordered.  Follow with Dr Lamonte Sakai in 1 month

## 2014-05-02 ENCOUNTER — Encounter: Payer: Self-pay | Admitting: Emergency Medicine

## 2014-05-13 ENCOUNTER — Encounter: Payer: Self-pay | Admitting: Family Medicine

## 2014-05-13 ENCOUNTER — Ambulatory Visit (INDEPENDENT_AMBULATORY_CARE_PROVIDER_SITE_OTHER): Payer: No Typology Code available for payment source | Admitting: Family Medicine

## 2014-05-13 VITALS — BP 138/88 | HR 98 | Temp 98.1°F | Ht 65.0 in | Wt 206.0 lb

## 2014-05-13 DIAGNOSIS — G43011 Migraine without aura, intractable, with status migrainosus: Secondary | ICD-10-CM

## 2014-05-13 MED ORDER — MEPERIDINE HCL 100 MG/ML IJ SOLN
100.0000 mg | Freq: Once | INTRAMUSCULAR | Status: AC
  Administered 2014-05-13: 100 mg via INTRAMUSCULAR

## 2014-05-13 MED ORDER — PROMETHAZINE HCL 50 MG/ML IJ SOLN
50.0000 mg | Freq: Once | INTRAMUSCULAR | Status: AC
  Administered 2014-05-13: 50 mg via INTRAMUSCULAR

## 2014-05-13 MED ORDER — PROMETHAZINE HCL 25 MG PO TABS: 25.0000 mg | ORAL_TABLET | ORAL | Status: DC | PRN

## 2014-05-13 MED ORDER — HYDROCODONE-ACETAMINOPHEN 7.5-325 MG PO TABS: 1.0000 | ORAL_TABLET | Freq: Every day | ORAL | Status: DC | PRN

## 2014-05-13 NOTE — Progress Notes (Signed)
   Subjective:    Patient ID: Gloria [name], female    DOB: 01/03/1951, 63 y.o.   MRN: [id number]  HPI Here for 3 days of throbbing HA and nausea, which is a typical migraine for her. Her husband drove her here. Norco has not helped.    Review of Systems  Constitutional: Negative.   Respiratory: Negative.   Cardiovascular: Negative.   Gastrointestinal: Positive for nausea. Negative for vomiting.  Neurological: Positive for headaches.       Objective:   Physical Exam  Constitutional: She is oriented to person, place, and time.  In pain, photophobic   Eyes: Conjunctivae are normal. Pupils are equal, round, and reactive to light.  Neck: Normal range of motion. Neck supple.  Neurological: She is alert and oriented to person, place, and time. No cranial nerve deficit.          Assessment & Plan:  Recheck prn

## 2014-05-13 NOTE — Addendum Note (Signed)
Addended by: Aggie Hacker A on: 05/13/2014 03:04 PM   Modules accepted: Orders

## 2014-05-13 NOTE — Progress Notes (Signed)
Pre visit review using our clinic review tool, if applicable. No additional management support is needed unless otherwise documented below in the visit note. 

## 2014-05-16 ENCOUNTER — Encounter: Payer: Self-pay | Admitting: Family Medicine

## 2014-05-16 ENCOUNTER — Ambulatory Visit (INDEPENDENT_AMBULATORY_CARE_PROVIDER_SITE_OTHER): Payer: No Typology Code available for payment source | Admitting: Family Medicine

## 2014-05-16 ENCOUNTER — Telehealth: Payer: Self-pay | Admitting: Family

## 2014-05-16 VITALS — BP 132/72 | HR 103 | Temp 98.1°F | Ht 65.0 in | Wt 208.7 lb

## 2014-05-16 DIAGNOSIS — G43811 Other migraine, intractable, with status migrainosus: Secondary | ICD-10-CM

## 2014-05-16 MED ORDER — MEPERIDINE HCL 50 MG PO TABS: 50.0000 mg | ORAL_TABLET | ORAL | Status: DC | PRN

## 2014-05-16 MED ORDER — MEPERIDINE HCL 25 MG/ML IJ SOLN
50.0000 mg | Freq: Once | INTRAMUSCULAR | Status: AC
  Administered 2014-05-16: 50 mg via INTRAMUSCULAR

## 2014-05-16 MED ORDER — PROMETHAZINE HCL 25 MG/ML IJ SOLN
25.0000 mg | Freq: Once | INTRAMUSCULAR | Status: AC
  Administered 2014-05-16: 25 mg via INTRAMUSCULAR

## 2014-05-16 MED ORDER — SUMATRIPTAN SUCCINATE 25 MG PO TABS: ORAL_TABLET | ORAL | Status: DC

## 2014-05-16 MED ORDER — ONDANSETRON 4 MG PO TBDP: 4.0000 mg | ORAL_TABLET | Freq: Three times a day (TID) | ORAL | Status: DC | PRN

## 2014-05-16 NOTE — Progress Notes (Addendum)
HPI:  Gloria [name] is a 63 yo F with MMP including migraines here for an acute visit for:  Nausea and vomiting: -has had migraines her whole life since age 42, on prophylactic medications in the past, report now gets them every few month and severe and persistent when she gets them and intractable ONLY responsive to demerol and phenergan -reports this is the same as in the past R sided, accompanied by nausea, dry heave, photophobia -seen several times here by her PCP a few months ago and Dr. Sarajane Jews last week for the same with her migraines - tx with phenergan and demerol reports this worked but under a lot of stress and it came back lat night - reports unable to refill narcotic Dr. Sarajane Jews Rxd due to time frame from when she got narcotic from her pulmonologist for a cough issue - but insists she did not take the narcotic from the pulmonologist as it did not work -reports: tried to take the phenergan this morning and she threw it up -on triptan in the past, but she doesn't have this any longer, wants to try again -denies: change in migraines, vision changes  ROS: See pertinent positives and negatives per HPI.  Past Medical History  Diagnosis Date  . Depressive disorder, not elsewhere classified   . Other and unspecified hyperlipidemia   . Unspecified essential hypertension   . Migraine, unspecified, without mention of intractable migraine without mention of status migrainosus   . Unspecified urinary incontinence   . Arthritis   . Chronic bronchitis   . Anxiety state, unspecified   . Postmenopausal symptoms   . Panic attacks   . Allergic rhinitis, cause unspecified 03/30/2013  . Borderline diabetes mellitus     a1c 7.0 (07/26/13)     Past Surgical History  Procedure Laterality Date  . Abdominal hysterectomy  2002  . Dislocated shoulder      right  . Diagnostic laparoscopy    . Tumor right ankle      Family History  Problem Relation Age of Onset  . Arthritis      family history  .  Hypertension      grandparent  . Colon cancer Maternal Grandfather   . Asthma Son     History   Social History  . Marital Status: Divorced    Spouse Name: N/A    Number of Children: 1  . Years of Education: N/A   Occupational History  . Self employed-interior design/flower arrangements    Social History Main Topics  . Smoking status: Never Smoker   . Smokeless tobacco: Never Used  . Alcohol Use: No  . Drug Use: No  . Sexual Activity: Not Currently   Other Topics Concern  . None   Social History Narrative    Current outpatient prescriptions: ALPRAZolam (XANAX) 1 MG tablet, Take 1 tablet (1 mg total) by mouth 2 (two) times daily as needed for anxiety or sleep. Do not fill more than once every 30 days, Disp: 60 tablet, Rfl: 5;  Cholecalciferol (VITAMIN D-3) 5000 UNITS TABS, Take 1 tablet by mouth daily., Disp: , Rfl: ;  [facility] Lancets MISC, USE TO CHECK BLOOD GLUCOSE ONCE DAILY FASTING, Disp: 200 each, Rfl: 0 Cyanocobalamin (VITAMIN B 12 PO), Take 1 capsule by mouth daily.  , Disp: , Rfl: ;  doxycycline (VIBRA-TABS) 100 MG tablet, Take 1 tablet (100 mg total) by mouth 2 (two) times daily., Disp: 14 tablet, Rfl: 0;  fexofenadine (ALLEGRA) 180 MG tablet, Take 1 tablet (  180 mg total) by mouth daily., Disp: 30 tablet, Rfl: 5;  fluticasone (FLONASE) 50 MCG/ACT nasal spray, Place 2 sprays into both nostrils daily., Disp: 16 g, Rfl: 5 glucose blood (ACCU-CHEK AVIVA) test strip, Use as instructed, Disp: 100 each, Rfl: 12;  hydrochlorothiazide (HYDRODIURIL) 50 MG tablet, Take 1 tablet (50 mg total) by mouth daily., Disp: 30 tablet, Rfl: 6;  HYDROcodone-acetaminophen (NORCO) 7.5-325 MG per tablet, Take 1 tablet by mouth daily as needed for moderate pain., Disp: 30 tablet, Rfl: 0 ibuprofen (ADVIL,MOTRIN) 600 MG tablet, Take 1 tablet (600 mg total) by mouth every 8 (eight) hours as needed for pain., Disp: 60 tablet, Rfl: 1;  irbesartan (AVAPRO) 150 MG tablet, Take 1 tablet (150 mg total) by mouth  daily., Disp: 30 tablet, Rfl: 6;  omeprazole (PRILOSEC) 40 MG capsule, Take 1 capsule (40 mg total) by mouth daily., Disp: 30 capsule, Rfl: 11 predniSONE (DELTASONE) 10 MG tablet, Take  4 each am x 2 days,   2 each am x 2 days,  1 each am x 2 days and stop, Disp: 14 tablet, Rfl: 0;  promethazine (PHENERGAN) 25 MG tablet, Take 1 tablet (25 mg total) by mouth every 4 (four) hours as needed for nausea or vomiting., Disp: 60 tablet, Rfl: 0;  Respiratory Therapy Supplies (FLUTTER) DEVI, Use as directed, Disp: 1 each, Rfl: 0 ondansetron (ZOFRAN ODT) 4 MG disintegrating tablet, Take 1 tablet (4 mg total) by mouth every 8 (eight) hours as needed for nausea or vomiting., Disp: 20 tablet, Rfl: 0;  SUMAtriptan (IMITREX) 25 MG tablet, 1 at the first sign of migraine, can take one more 2 hours later. No more then 2 in 24 hours., Disp: 10 tablet, Rfl: 0  EXAM:  Filed Vitals:   05/16/14 1438  BP: 132/72  Pulse: 103  Temp: 98.1 F (36.7 C)    Body mass index is 34.73 kg/(m^2).  GENERAL: vitals reviewed and listed above, alert, oriented, appears well hydrated and in no acute distress  HEENT: atraumatic, PERRLA, conjunttiva clear, no obvious abnormalities on inspection of external nose and ears  NECK: no obvious masses on inspection  LUNGS: clear to auscultation bilaterally, no wheezes, rales or rhonchi, good air movement  CV: HRRR, no peripheral edema  MS: moves all extremities without noticeable abnormality  PSYCH: pleasant and cooperative, no obvious depression or anxiety  NEURO:  CN II-XII grossly intact, finger to nose nml, no carotid bruit  ASSESSMENT AND PLAN:  Discussed the following assessment and plan:  Other migraine with status migrainosus, intractable - Plan: SUMAtriptan (IMITREX) 25 MG tablet, ondansetron (ZOFRAN ODT) 4 MG disintegrating tablet, promethazine (PHENERGAN) injection 25 mg, meperidine (DEMEROL) injection 50 mg  -discussed tx options, risks -advise opiods risks -she  insists she knows he body and knows demerol is the only medication that will abort her migraine -advised not to use with other pain medications, not to drive -she demanded another dose of demerol after the initial dose - I advised eval in ED if migraine not responding, she reports she has had extensive evaluations and continued to request narcotic pain medication  - office manager involved and will notify PCP -trial of triptan for next migraine and odt zofran -advised if worsening or persistent headaches emergency precautions and possible eval with neurologist if changing HAs - but she reports this is typical for her  -Patient advised to return or notify a doctor immediately if symptoms worsen or persist or new concerns arise.  Patient Instructions  Follow up with [facility]  in 3-4 weeks  Do not drive today  Please see you primary doctor if you have any further migraines not responsive to therapy at home   Please try the imitrex if you develop another migraine - use 1 tablet at the first sign of a migraine and can use 1 more tablet in 2 hours; no more the 2 in 24 hours and the  Zofran. Do not use other pain or nausea medications with these.       Colin Benton R.

## 2014-05-16 NOTE — Telephone Encounter (Signed)
Spoke with patient while in the office per request of Dr. Maudie Mercury.  Per Dr. Maudie Mercury, pt stated she was not going to leave the office until she received 100 mg of demerol.  Dr. Maudie Mercury advised pt she was not comfortable with given her the amount of medication requested and was also concern about patient stating she had received a narcotic from her pulmonologist.  The pt states she did not have the rx filled but stated the pharmacy would not fill the rx Dr. Sarajane Jews prescribed last Friday due to her recently refilling another controlled medication.  Pt was offered a referral to [facility], however, pt declined stating she had already had a work up in the past and that she also can not afford to go to the ED or to a specialist.  Routing to PCP as fyi.

## 2014-05-16 NOTE — Addendum Note (Signed)
Addended by: Agnes Lawrence on: 05/16/2014 04:05 PM   Modules accepted: Orders, Medications

## 2014-05-16 NOTE — Progress Notes (Signed)
Pre visit review using our clinic review tool, if applicable. No additional management support is needed unless otherwise documented below in the visit note. 

## 2014-05-16 NOTE — Patient Instructions (Addendum)
Follow up with Padonda in 3-4 weeks  Do not drive today  Please see you primary doctor if you have any further migraines not responsive to therapy at home   Please try the imitrex if you develop another migraine - use 1 tablet at the first sign of a migraine and can use 1 more tablet in 2 hours; no more the 2 in 24 hours and the  Zofran. Do not use other pain or nausea medications with these.

## 2014-05-22 ENCOUNTER — Other Ambulatory Visit: Payer: Self-pay | Admitting: Internal Medicine

## 2014-08-11 ENCOUNTER — Telehealth: Payer: Self-pay | Admitting: Family

## 2014-08-11 NOTE — Telephone Encounter (Signed)
Patient called to request an appointment with [facility] for headache and discuss who to transfer to for primary care.  She received the letter and expressed not wanting Dr. Maudie Mercury and wanted to know more information on Dr. Ansel Bong reputation.  Then asked to tranfers to Dr. Sarajane Jews?  I re-directed to the letter with the 2 providers listed.  I offered her an appointment to see [facility] for her headache but she declined and stated she would like a callback from [facility] to further discuss.  Please call patient.

## 2014-08-12 NOTE — Telephone Encounter (Signed)
Pt requests that I ask Dr. Sarajane Jews to accept her as a new pt because she has seen him in the past.   Pt also requesting an Rx for Hydrocodone for migraines. She states that she has always suffered from migraines and does not take the Hydrocodone often. She said the last Rx that she received was given by [facility] and it lasted her 3 months. I advised pt that Dr.Fry gave her an Rx on 05/13/2014 for #30. She said she will check all of her purses to see if the medication in there because she should have some left. I also advised pt that narcotics are not recommended for headaches and [facility] may not authorize a refill. She will call back to let me know.

## 2014-08-15 NOTE — Telephone Encounter (Signed)
Pt called on Friday to say she did not find the Rx given to her by Dr. Sarajane Jews.   [facility] did not authorize a refill of the Hydrocodone.  Called pt to advise of this. Left message for pt to call back

## 2014-08-15 NOTE — Telephone Encounter (Signed)
Pt returned my call. I advised her that Dr. Sarajane Jews is not currently accepting NP and that [facility] denied her request for a refill of Hydrocodone. Advised that she will need an appt to get meds for her migraine. She states she doesn't want to see Dr. Maudie Mercury because she felt as though provider was "bland and uncaring". I advised pt that she will be hard-pressed to find a provider here that will continuously Rx Hydrocodone for her migraines or one that will approve her getting Demerol when she comes in the office.  She also states that her feet tingle a lot and she wants to know what her a1c was when it was last checked. I advised that it was 7.0 and Padonda's instructions were to watch her diet and get exercise. She then wanted to know if Dr. Yong Channel would be able to tx her for this if she chooses to establish with him. I advised that he may choose to tx her here or refer her to Endo. She does not want to see Endo due to the $40 co-pay.   I asked pt if she would like an appt for her migraine and she said she will continue to take Tylenol Migraine medication and "just kind of deal with it". She does not want to spend money coming in the office "for nothing".

## 2014-09-02 ENCOUNTER — Emergency Department (INDEPENDENT_AMBULATORY_CARE_PROVIDER_SITE_OTHER)
Admission: EM | Admit: 2014-09-02 | Discharge: 2014-09-02 | Disposition: A | Discharge status: Nursing Home (Includes ICF) | Payer: 59 | Source: Home / Self Care | Attending: Family Medicine | Admitting: Family Medicine

## 2014-09-02 ENCOUNTER — Encounter (HOSPITAL_COMMUNITY): Payer: Self-pay | Admitting: Emergency Medicine

## 2014-09-02 ENCOUNTER — Emergency Department (HOSPITAL_COMMUNITY)
Admission: EM | Admit: 2014-09-02 | Discharge: 2014-09-02 | Disposition: A | Discharge status: Home / Self Care | Payer: 59 | Attending: Emergency Medicine | Admitting: Emergency Medicine

## 2014-09-02 ENCOUNTER — Telehealth: Payer: Self-pay | Admitting: Emergency Medicine

## 2014-09-02 ENCOUNTER — Emergency Department (HOSPITAL_COMMUNITY): Payer: 59

## 2014-09-02 DIAGNOSIS — J45901 Unspecified asthma with (acute) exacerbation: Secondary | ICD-10-CM | POA: Diagnosis not present

## 2014-09-02 DIAGNOSIS — G43809 Other migraine, not intractable, without status migrainosus: Secondary | ICD-10-CM | POA: Insufficient documentation

## 2014-09-02 DIAGNOSIS — I10 Essential (primary) hypertension: Secondary | ICD-10-CM | POA: Diagnosis not present

## 2014-09-02 DIAGNOSIS — Z7951 Long term (current) use of inhaled steroids: Secondary | ICD-10-CM | POA: Insufficient documentation

## 2014-09-02 DIAGNOSIS — Z8639 Personal history of other endocrine, nutritional and metabolic disease: Secondary | ICD-10-CM | POA: Insufficient documentation

## 2014-09-02 DIAGNOSIS — Z79899 Other long term (current) drug therapy: Secondary | ICD-10-CM | POA: Diagnosis not present

## 2014-09-02 DIAGNOSIS — R51 Headache: Secondary | ICD-10-CM | POA: Diagnosis present

## 2014-09-02 DIAGNOSIS — G43801 Other migraine, not intractable, with status migrainosus: Secondary | ICD-10-CM

## 2014-09-02 DIAGNOSIS — Z880 Allergy status to penicillin: Secondary | ICD-10-CM | POA: Insufficient documentation

## 2014-09-02 DIAGNOSIS — G43901 Migraine, unspecified, not intractable, with status migrainosus: Secondary | ICD-10-CM

## 2014-09-02 DIAGNOSIS — Z792 Long term (current) use of antibiotics: Secondary | ICD-10-CM | POA: Diagnosis not present

## 2014-09-02 DIAGNOSIS — F329 Major depressive disorder, single episode, unspecified: Secondary | ICD-10-CM | POA: Diagnosis not present

## 2014-09-02 DIAGNOSIS — M1990 Unspecified osteoarthritis, unspecified site: Secondary | ICD-10-CM | POA: Diagnosis not present

## 2014-09-02 DIAGNOSIS — Z8742 Personal history of other diseases of the female genital tract: Secondary | ICD-10-CM | POA: Diagnosis not present

## 2014-09-02 DIAGNOSIS — F419 Anxiety disorder, unspecified: Secondary | ICD-10-CM | POA: Diagnosis not present

## 2014-09-02 DIAGNOSIS — J4541 Moderate persistent asthma with (acute) exacerbation: Secondary | ICD-10-CM

## 2014-09-02 LAB — BASIC METABOLIC PANEL
Anion gap: 11 (ref 5–15)
BUN: 17 mg/dL (ref 6–23)
CO2: 27 mmol/L (ref 19–32)
Calcium: 9.1 mg/dL (ref 8.4–10.5)
Chloride: 98 mmol/L (ref 96–112)
Creatinine, Ser: 0.84 mg/dL (ref 0.50–1.10)
GFR calc Af Amer: 83 mL/min — ABNORMAL LOW (ref >90)
GFR calc non Af Amer: 72 mL/min — ABNORMAL LOW (ref >90)
Glucose, Bld: 194 mg/dL — ABNORMAL HIGH (ref 70–99)
Potassium: 3.4 mmol/L — ABNORMAL LOW (ref 3.5–5.1)
Sodium: 136 mmol/L (ref 135–145)

## 2014-09-02 LAB — CBC
HCT: 36.6 % (ref 36.0–46.0)
Hemoglobin: 11.8 g/dL — ABNORMAL LOW (ref 12.0–15.0)
MCH: 28.2 pg (ref 26.0–34.0)
MCHC: 32.2 g/dL (ref 30.0–36.0)
MCV: 87.4 fL (ref 78.0–100.0)
Platelets: 292 10*3/uL (ref 150–400)
RBC: 4.19 MIL/uL (ref 3.87–5.11)
RDW: 15.3 % (ref 11.5–15.5)
WBC: 14.6 10*3/uL — ABNORMAL HIGH (ref 4.0–10.5)

## 2014-09-02 MED ORDER — DIPHENHYDRAMINE HCL 50 MG/ML IJ SOLN: 25.0000 mg | Freq: Once | INTRAMUSCULAR | Status: DC

## 2014-09-02 MED ORDER — SODIUM CHLORIDE 0.9 % IJ SOLN
INTRAMUSCULAR | Status: AC
  Filled 2014-09-02: qty 3

## 2014-09-02 MED ORDER — DIPHENHYDRAMINE HCL 50 MG/ML IJ SOLN
INTRAMUSCULAR | Status: AC
  Filled 2014-09-02: qty 1

## 2014-09-02 MED ORDER — ALBUTEROL SULFATE HFA 108 (90 BASE) MCG/ACT IN AERS
2.0000 | INHALATION_SPRAY | RESPIRATORY_TRACT | Status: DC | PRN
  Administered 2014-09-02: 2 via RESPIRATORY_TRACT
  Filled 2014-09-02: qty 6.7

## 2014-09-02 MED ORDER — METHYLPREDNISOLONE SODIUM SUCC 125 MG IJ SOLR
125.0000 mg | Freq: Once | INTRAMUSCULAR | Status: AC
  Administered 2014-09-02: 125 mg via INTRAMUSCULAR

## 2014-09-02 MED ORDER — ALBUTEROL SULFATE (2.5 MG/3ML) 0.083% IN NEBU
5.0000 mg | INHALATION_SOLUTION | Freq: Once | RESPIRATORY_TRACT | Status: AC
  Administered 2014-09-02: 5 mg via RESPIRATORY_TRACT

## 2014-09-02 MED ORDER — METOCLOPRAMIDE HCL 5 MG/ML IJ SOLN
10.0000 mg | Freq: Once | INTRAMUSCULAR | Status: AC
  Administered 2014-09-02: 10 mg via INTRAMUSCULAR

## 2014-09-02 MED ORDER — METHYLPREDNISOLONE SODIUM SUCC 125 MG IJ SOLR
INTRAMUSCULAR | Status: AC
  Filled 2014-09-02: qty 2

## 2014-09-02 MED ORDER — PROMETHAZINE HCL 25 MG PO TABS: 25.0000 mg | ORAL_TABLET | Freq: Four times a day (QID) | ORAL | Status: DC | PRN

## 2014-09-02 MED ORDER — PROMETHAZINE HCL 25 MG/ML IJ SOLN
25.0000 mg | Freq: Once | INTRAMUSCULAR | Status: AC
  Administered 2014-09-02: 25 mg via INTRAMUSCULAR
  Filled 2014-09-02: qty 1

## 2014-09-02 MED ORDER — IPRATROPIUM BROMIDE 0.02 % IN SOLN
RESPIRATORY_TRACT | Status: AC
  Filled 2014-09-02: qty 2.5

## 2014-09-02 MED ORDER — HYDROMORPHONE HCL 1 MG/ML IJ SOLN
1.0000 mg | Freq: Once | INTRAMUSCULAR | Status: AC
  Administered 2014-09-02: 1 mg via INTRAMUSCULAR
  Filled 2014-09-02: qty 1

## 2014-09-02 MED ORDER — KETOROLAC TROMETHAMINE 60 MG/2ML IM SOLN
60.0000 mg | Freq: Once | INTRAMUSCULAR | Status: AC
  Administered 2014-09-02: 60 mg via INTRAMUSCULAR

## 2014-09-02 MED ORDER — ALBUTEROL SULFATE (2.5 MG/3ML) 0.083% IN NEBU
INHALATION_SOLUTION | RESPIRATORY_TRACT | Status: AC
  Filled 2014-09-02: qty 3

## 2014-09-02 MED ORDER — DIPHENHYDRAMINE HCL 25 MG PO CAPS
ORAL_CAPSULE | ORAL | Status: AC
Start: 2014-09-02 — End: 2014-09-02
  Filled 2014-09-02: qty 1

## 2014-09-02 MED ORDER — METOCLOPRAMIDE HCL 5 MG/ML IJ SOLN
INTRAMUSCULAR | Status: AC
  Filled 2014-09-02: qty 2

## 2014-09-02 MED ORDER — ALBUTEROL SULFATE HFA 108 (90 BASE) MCG/ACT IN AERS: 1.0000 | INHALATION_SPRAY | Freq: Four times a day (QID) | RESPIRATORY_TRACT | Status: DC | PRN

## 2014-09-02 MED ORDER — IPRATROPIUM BROMIDE 0.02 % IN SOLN
0.5000 mg | Freq: Once | RESPIRATORY_TRACT | Status: AC
  Administered 2014-09-02: 0.5 mg via RESPIRATORY_TRACT

## 2014-09-02 MED ORDER — HYDROCODONE-ACETAMINOPHEN 5-325 MG PO TABS: 1.0000 | ORAL_TABLET | ORAL | Status: DC | PRN

## 2014-09-02 MED ORDER — KETOROLAC TROMETHAMINE 60 MG/2ML IM SOLN
INTRAMUSCULAR | Status: AC
  Filled 2014-09-02: qty 2

## 2014-09-02 MED ORDER — ALBUTEROL SULFATE (2.5 MG/3ML) 0.083% IN NEBU
INHALATION_SOLUTION | RESPIRATORY_TRACT | Status: AC
  Filled 2014-09-02: qty 6

## 2014-09-02 NOTE — ED Provider Notes (Signed)
CSN: [id number]     Arrival date & time 09/02/14  1502 History   First MD Initiated Contact with Patient 09/02/14 1559     Chief Complaint  Patient presents with  . Shortness of Breath  . Migraine   (Consider location/radiation/quality/duration/timing/severity/associated sxs/prior Treatment) HPI          64 year old female with history of asthma as well as migraine headaches presents complaining of asthma exacerbation with wheezing and shortness of breath and a migraine headache. This initially started about 5 days ago. She has been using her albuterol nebulizer machine every 3-4 hours at home without with out much relief. She most recently did a nebulizer treatment just prior to coming here today. She also has a cough. She feels like the difficulty breathing has precipitated a migraine, she has had a migraine for 2 days. She describes this as a frontal headache that is typical of all of her previous migraines, there is no difference between this headache and her typical migraines. No numbness or weakness. No fever, chills, NVD, chest pain.  Past Medical History  Diagnosis Date  . Depressive disorder, not elsewhere classified   . Other and unspecified hyperlipidemia   . Unspecified essential hypertension   . Migraine, unspecified, without mention of intractable migraine without mention of status migrainosus   . Unspecified urinary incontinence   . Arthritis   . Chronic bronchitis   . Anxiety state, unspecified   . Postmenopausal symptoms   . Panic attacks   . Allergic rhinitis, cause unspecified 03/30/2013  . Borderline diabetes mellitus     a1c 7.0 (07/26/13)    Past Surgical History  Procedure Laterality Date  . Abdominal hysterectomy  2002  . Dislocated shoulder      right  . Diagnostic laparoscopy    . Tumor right ankle     Family History  Problem Relation Age of Onset  . Arthritis      family history  . Hypertension      grandparent  . Colon cancer Maternal Grandfather   .  Asthma Son    History  Substance Use Topics  . Smoking status: Never Smoker   . Smokeless tobacco: Never Used  . Alcohol Use: No   OB History    Gravida Para Term Preterm AB TAB SAB Ectopic Multiple Living   1 1 1       1      Review of Systems  Constitutional: Negative for fever and chills.  Respiratory: Positive for cough, chest tightness and wheezing.   Cardiovascular: Negative for chest pain.  Neurological: Positive for headaches.  All other systems reviewed and are negative.   Allergies  Lisinopril; Codeine; Compazine; Haloperidol decanoate; and Penicillins  Home Medications   Prior to Admission medications   Medication Sig Start Date End Date Taking? Authorizing Provider  ALPRAZolam Duanne Moron) 1 MG tablet Take 1 tablet (1 mg total) by mouth 2 (two) times daily as needed for anxiety or sleep. Do not fill more than once every 30 days 07/26/13   Rowe Clack, MD  Cholecalciferol (VITAMIN D-3) 5000 UNITS TABS Take 1 tablet by mouth daily.    Historical Provider, MD  [facility] Lancets MISC USE TO CHECK BLOOD GLUCOSE ONCE DAILY FASTING 02/01/14   Timoteo Gaul, FNP  Cyanocobalamin (VITAMIN B 12 PO) Take 1 capsule by mouth daily.      Historical Provider, MD  doxycycline (VIBRA-TABS) 100 MG tablet Take 1 tablet (100 mg total) by mouth 2 (two) times daily.  04/20/14   Collene Gobble, MD  fexofenadine (ALLEGRA) 180 MG tablet Take 1 tablet (180 mg total) by mouth daily. 03/21/14   Collene Gobble, MD  fluticasone (FLONASE) 50 MCG/ACT nasal spray Place 2 sprays into both nostrils daily. 03/21/14   Collene Gobble, MD  glucose blood (ACCU-CHEK AVIVA) test strip Use as instructed 01/21/14   Timoteo Gaul, FNP  hydrochlorothiazide (HYDRODIURIL) 50 MG tablet TAKE 1 TABLET BY MOUTH EVERY DAY 05/23/14   Tanda Rockers, MD  HYDROcodone-acetaminophen (NORCO) 7.5-325 MG per tablet Take 1 tablet by mouth daily as needed for moderate pain. 05/13/14   Laurey Morale, MD  ibuprofen (ADVIL,MOTRIN) 600  MG tablet Take 1 tablet (600 mg total) by mouth every 8 (eight) hours as needed for pain. 11/11/12   Rowe Clack, MD  irbesartan (AVAPRO) 150 MG tablet TAKE 1 TABLET BY MOUTH DAILY 05/23/14   Tanda Rockers, MD  losartan-hydrochlorothiazide [facility]) 50-12.5 MG per tablet TAKE 1 TABLET BY MOUTH DAILY 05/23/14   Timoteo Gaul, FNP  omeprazole (PRILOSEC) 40 MG capsule Take 1 capsule (40 mg total) by mouth daily. 04/26/14   Collene Gobble, MD  ondansetron (ZOFRAN ODT) 4 MG disintegrating tablet Take 1 tablet (4 mg total) by mouth every 8 (eight) hours as needed for nausea or vomiting. 05/16/14   Lucretia Kern, DO  predniSONE (DELTASONE) 10 MG tablet Take  4 each am x 2 days,   2 each am x 2 days,  1 each am x 2 days and stop 03/29/14   Tanda Rockers, MD  promethazine (PHENERGAN) 25 MG tablet Take 1 tablet (25 mg total) by mouth every 4 (four) hours as needed for nausea or vomiting. 05/13/14   Laurey Morale, MD  Respiratory Therapy Supplies (FLUTTER) DEVI Use as directed 03/30/14   Tanda Rockers, MD  SUMAtriptan (IMITREX) 25 MG tablet 1 at the first sign of migraine, can take one more 2 hours later. No more then 2 in 24 hours. 05/16/14   Lucretia Kern, DO   BP 142/82 mmHg  Pulse 94  Temp(Src) 97.9 F (36.6 C) (Oral)  Resp 30  SpO2 96% Physical Exam  Constitutional: She is oriented to person, place, and time. Vital signs are normal. She appears well-developed and well-nourished. No distress.  HENT:  Head: Normocephalic and atraumatic.  Right Ear: External ear normal.  Left Ear: External ear normal.  Nose: Nose normal.  Mouth/Throat: Oropharynx is clear and moist. No oropharyngeal exudate.  Neck: Normal range of motion. Neck supple.  Cardiovascular: Normal rate, regular rhythm and normal heart sounds.   Pulmonary/Chest: Accessory muscle usage present. She is in respiratory distress. She has wheezes (diffuse, expiratory, with prolonged expiratory component). She has no rales.   Lymphadenopathy:    She has no cervical adenopathy.  Neurological: She is alert and oriented to person, place, and time. She has normal strength. No cranial nerve deficit or sensory deficit. She exhibits normal muscle tone. Coordination and gait normal.  Skin: Skin is warm and dry. No rash noted. She is not diaphoretic.  Psychiatric: She has a normal mood and affect. Judgment normal.  Nursing note and vitals reviewed.   ED Course  Procedures (including critical care time) Labs Review Labs Reviewed - No data to display  Imaging Review No results found.   MDM   1. Asthma exacerbation attacks, moderate persistent   2. Status migrainosus    After treatment with 2 breathing treatments of  5 mg of albuterol and 0.5 mg of Atrovent, she is no better. She still has audible wheezing and prolonged expiratory component with tachypnea. Also after treatment with Toradol and Reglan, her headache is unchanged, still severe. She will be transferred to the emergency department for further evaluation and treatment   Meds ordered this encounter  Medications  . diphenhydrAMINE (BENADRYL) injection 25 mg    Sig:   . ketorolac (TORADOL) injection 60 mg    Sig:   . metoCLOPramide (REGLAN) injection 10 mg    Sig:   . methylPREDNISolone sodium succinate (SOLU-MEDROL) 125 mg/2 mL injection 125 mg    Sig:   . albuterol (PROVENTIL) (2.5 MG/3ML) 0.083% nebulizer solution 5 mg    Sig:   . ipratropium (ATROVENT) nebulizer solution 0.5 mg    Sig:   . albuterol (PROVENTIL) (2.5 MG/3ML) 0.083% nebulizer solution 5 mg    Sig:   . ipratropium (ATROVENT) nebulizer solution 0.5 mg    Sig:        Liam Graham, PA-C 09/02/14 1737

## 2014-09-02 NOTE — ED Notes (Signed)
Pt to ED from [facility] for further evaluation of shortness of breath and a headache.  Pt admits to being short of breath for the past week- received 2 breathing treatments at [facility] which pt reports "helped some"- no respiratory distress noted at present, pt speaking in full sentences without difficulty.  Denies CP.  Pt also has had a headache for the past 2 days which is sensitive to light and sound, admits to some nausea.  Neuro exam normal.

## 2014-09-02 NOTE — ED Provider Notes (Signed)
CSN: [id number]     Arrival date & time 09/02/14  1747 History   First MD Initiated Contact with Patient 09/02/14 2012     Chief Complaint  Patient presents with  . Headache  . Asthma     (Consider location/radiation/quality/duration/timing/severity/associated sxs/prior Treatment) HPI Comments: Presents from Urgent Care with complaint of migraine for the past 4 days. She has a history of migraine occurring every 3 months. Imitrex in addition to daily Tylenol without relief. Worsening over time. Right sided headache with photophobia and phonophobia. This is typical of her migraine history and she feels it is instigated by stress. Nausea with vomiting x 2.  Patient is a 64 y.o. female presenting with headaches and asthma. The history is provided by the patient. No language interpreter was used.  Headache Associated symptoms: nausea and vomiting   Associated symptoms: no abdominal pain, no fever, no neck pain and no weakness   Asthma This is a recurrent problem. Associated symptoms include headaches, nausea and vomiting. Pertinent negatives include no abdominal pain, chills, fever, neck pain, rash or weakness. Associated symptoms comments: She was seen at Urgent Care prior to arrival for complaint of headache and asthma. She was given 2 nebulizer treatments and feels her asthma symptoms are now well controlled. .    Past Medical History  Diagnosis Date  . Depressive disorder, not elsewhere classified   . Other and unspecified hyperlipidemia   . Unspecified essential hypertension   . Migraine, unspecified, without mention of intractable migraine without mention of status migrainosus   . Unspecified urinary incontinence   . Arthritis   . Chronic bronchitis   . Anxiety state, unspecified   . Postmenopausal symptoms   . Panic attacks   . Allergic rhinitis, cause unspecified 03/30/2013  . Borderline diabetes mellitus     a1c 7.0 (07/26/13)    Past Surgical History  Procedure Laterality  Date  . Abdominal hysterectomy  2002  . Dislocated shoulder      right  . Diagnostic laparoscopy    . Tumor right ankle     Family History  Problem Relation Age of Onset  . Arthritis      family history  . Hypertension      grandparent  . Colon cancer Maternal Grandfather   . Asthma Son    History  Substance Use Topics  . Smoking status: Never Smoker   . Smokeless tobacco: Never Used  . Alcohol Use: No   OB History    Gravida Para Term Preterm AB TAB SAB Ectopic Multiple Living   1 1 1       1      Review of Systems  Constitutional: Negative for fever and chills.  Respiratory: Positive for wheezing.   Cardiovascular: Negative.   Gastrointestinal: Positive for nausea and vomiting. Negative for abdominal pain.  Musculoskeletal: Negative.  Negative for neck pain.  Skin: Negative.  Negative for rash.  Neurological: Positive for headaches. Negative for weakness.      Allergies  Compazine; Haloperidol decanoate; Penicillins; Codeine; and Lisinopril  Home Medications   Prior to Admission medications   Medication Sig Start Date End Date Taking? Authorizing Provider  ALPRAZolam Duanne Moron) 1 MG tablet Take 1 tablet (1 mg total) by mouth 2 (two) times daily as needed for anxiety or sleep. Do not fill more than once every 30 days 07/26/13  Yes Rowe Clack, MD  Cholecalciferol (VITAMIN D-3) 5000 UNITS TABS Take 1 tablet by mouth daily.   Yes Historical Provider, MD  Cyanocobalamin (VITAMIN B 12 PO) Take 1 capsule by mouth daily.     Yes Historical Provider, MD  fexofenadine (ALLEGRA) 180 MG tablet Take 1 tablet (180 mg total) by mouth daily. 03/21/14  Yes Collene Gobble, MD  fluticasone (FLONASE) 50 MCG/ACT nasal spray Place 2 sprays into both nostrils daily. 03/21/14  Yes Collene Gobble, MD  hydrochlorothiazide (HYDRODIURIL) 50 MG tablet TAKE 1 TABLET BY MOUTH EVERY DAY 05/23/14  Yes Tanda Rockers, MD  HYDROcodone-acetaminophen (NORCO) 7.5-325 MG per tablet Take 1 tablet by  mouth daily as needed for moderate pain. 05/13/14  Yes Laurey Morale, MD  ibuprofen (ADVIL,MOTRIN) 600 MG tablet Take 1 tablet (600 mg total) by mouth every 8 (eight) hours as needed for pain. 11/11/12  Yes Rowe Clack, MD  irbesartan (AVAPRO) 150 MG tablet TAKE 1 TABLET BY MOUTH DAILY 05/23/14  Yes Tanda Rockers, MD  losartan-hydrochlorothiazide [facility]) 50-12.5 MG per tablet TAKE 1 TABLET BY MOUTH DAILY 05/23/14  Yes Timoteo Gaul, FNP  omeprazole (PRILOSEC) 40 MG capsule Take 1 capsule (40 mg total) by mouth daily. 04/26/14  Yes Collene Gobble, MD  ondansetron (ZOFRAN ODT) 4 MG disintegrating tablet Take 1 tablet (4 mg total) by mouth every 8 (eight) hours as needed for nausea or vomiting. 05/16/14  Yes Lucretia Kern, DO  predniSONE (DELTASONE) 10 MG tablet Take  4 each am x 2 days,   2 each am x 2 days,  1 each am x 2 days and stop 03/29/14  Yes Tanda Rockers, MD  promethazine (PHENERGAN) 25 MG tablet Take 1 tablet (25 mg total) by mouth every 4 (four) hours as needed for nausea or vomiting. 05/13/14  Yes Laurey Morale, MD  SUMAtriptan (IMITREX) 25 MG tablet 1 at the first sign of migraine, can take one more 2 hours later. No more then 2 in 24 hours. 05/16/14  Yes Lucretia Kern, DO  [facility] Lancets MISC USE TO CHECK BLOOD GLUCOSE ONCE DAILY FASTING 02/01/14   Timoteo Gaul, FNP  doxycycline (VIBRA-TABS) 100 MG tablet Take 1 tablet (100 mg total) by mouth 2 (two) times daily. 04/20/14   Collene Gobble, MD  glucose blood (ACCU-CHEK AVIVA) test strip Use as instructed 01/21/14   Timoteo Gaul, FNP  Respiratory Therapy Supplies (FLUTTER) DEVI Use as directed 03/30/14   Tanda Rockers, MD   BP 157/72 mmHg  Pulse 105  Temp(Src) 97.6 F (36.4 C)  Resp 18  SpO2 96% Physical Exam  Constitutional: She is oriented to person, place, and time. She appears well-developed and well-nourished.  HENT:  Head: Normocephalic.  Eyes: Pupils are equal, round, and reactive to light.  Neck: Normal  range of motion. Neck supple.  Cardiovascular: Normal rate and regular rhythm.   Pulmonary/Chest: Effort normal and breath sounds normal.  Abdominal: Soft. Bowel sounds are normal. There is no tenderness. There is no rebound and no guarding.  Musculoskeletal: Normal range of motion.  Neurological: She is alert and oriented to person, place, and time. She has normal strength and normal reflexes. No sensory deficit. She displays a negative Romberg sign. Coordination normal.  Speech clear and focused. No deficits of coordination. CN's 3-12 grossly intact.   Skin: Skin is warm and dry. No rash noted.  Psychiatric: She has a normal mood and affect.    ED Course  Procedures (including critical care time) Labs Review Labs Reviewed  CBC - Abnormal; Notable for the following:  WBC 14.6 (*)    Hemoglobin 11.8 (*)    All other components within normal limits  BASIC METABOLIC PANEL - Abnormal; Notable for the following:    Potassium 3.4 (*)    Glucose, Bld 194 (*)    GFR calc non Af Amer 72 (*)    GFR calc Af Amer 83 (*)    All other components within normal limits    Imaging Review No results found.   EKG Interpretation None      MDM   Final diagnoses:  None    1. Migraine headache 2. Asthma, resolved  The patient reports she does not need further management of asthma symptoms after nebulizer treatments given prior to arrival by [facility].   Migraine improved with medications. Similar to previous headaches, normal neurologic exam without deficit today. Stable for discharge home.     Charlann Lange, PA-C 09/08/14 0106  Quintella Reichert, MD 09/08/14 [phone]

## 2014-09-02 NOTE — Discharge Instructions (Signed)

## 2014-09-02 NOTE — ED Notes (Signed)
Patient c/o shortness of breath and headache x 1 week. Patient reports last week she had cold sx and felt congested. She reports she cannot take prednisone makes her feel like something is crawling and hallucinates. Patient is in NAD. She is wheezing heavily on triage.

## 2014-09-02 NOTE — ED Notes (Signed)
Kayla PA at bedside.  

## 2014-09-02 NOTE — Telephone Encounter (Signed)
Patient has had breathing problem for a while.  She has been using nebulizer machine. She doesn't want to go back on prednisone again.  Rx for Tussinex was called in, but it was too expensive for her, so she was given Hydrocodone-Acet.  She says this works okay for her. Pt says that her neb treatments are not working as well anymore, wants to know if she can get a stronger neb medication.  Needs refill on rescue inhaler.  Wheezing really bad, very fatigued, cannot get comfortable.  There are no openings on the schedule today, what do you recommend?  (Pt advised to go to call 911 or go to ER if she is struggling.)  RB:  Please advise.

## 2014-09-02 NOTE — Telephone Encounter (Signed)
Per RB - pt needs to be seen >> ED or urgent care.  Pt is aware of this information. She will go to [facility]. Nothing further was needed.

## 2014-09-12 ENCOUNTER — Ambulatory Visit (INDEPENDENT_AMBULATORY_CARE_PROVIDER_SITE_OTHER): Payer: 59 | Admitting: Internal Medicine

## 2014-09-12 ENCOUNTER — Encounter: Payer: Self-pay | Admitting: Internal Medicine

## 2014-09-12 VITALS — BP 126/68 | HR 101 | Temp 98.4°F | Ht 65.0 in | Wt 213.0 lb

## 2014-09-12 DIAGNOSIS — I10 Essential (primary) hypertension: Secondary | ICD-10-CM

## 2014-09-12 DIAGNOSIS — R059 Cough, unspecified: Secondary | ICD-10-CM

## 2014-09-12 DIAGNOSIS — R058 Other specified cough: Secondary | ICD-10-CM

## 2014-09-12 DIAGNOSIS — R05 Cough: Secondary | ICD-10-CM

## 2014-09-12 MED ORDER — METHYLPREDNISOLONE ACETATE 80 MG/ML IJ SUSP
120.0000 mg | Freq: Once | INTRAMUSCULAR | Status: AC
  Administered 2014-09-12: 120 mg via INTRAMUSCULAR

## 2014-09-12 MED ORDER — IRBESARTAN 150 MG PO TABS: 150.0000 mg | ORAL_TABLET | Freq: Every day | ORAL | Status: DC

## 2014-09-12 MED ORDER — CEFDINIR 300 MG PO CAPS: 300.0000 mg | ORAL_CAPSULE | Freq: Two times a day (BID) | ORAL | Status: DC

## 2014-09-12 MED ORDER — HYDROCODONE-ACETAMINOPHEN 5-325 MG PO TABS
1.0000 | ORAL_TABLET | ORAL | Status: DC | PRN
Start: 2014-09-12 — End: 2014-10-03

## 2014-09-12 NOTE — Progress Notes (Signed)
Subjective:    Patient ID: Gloria [name], female    DOB: 1950/07/14    MRN: [id number]    Brief patient profile:  45 yobf sickly child (? Pna, whooping cough, asthma) never full aerobic tolerance then better ages 10- 80 and no daily rx or need to go doctor and self rx with otcs like nyquil then much  Worse since 21-Oct-2004 when mother died and since then freq prns including  albuterol neb worse since Oct 22, 2011 and needing daily dulera 200 (though admits not consistent with it) referred 08/02/2013 by Dr Basilio Cairo for chronic variable cough worse since Jan 2015    History of Present Illness  08/02/2013 1st [facility] office visit/ [name] cc min prod (always white mucus) cough every day esp in am x sev years better on prednisone but "gained too much wt" from baseline 180  - wakes up daily 3 am smothering / hacking > goes to [facility] and uses dulera and helps and props up head and goes back to bed and then wakes up s flare. Also neb maybe once a month. Worse with pollen exp x 2 years  rec Pepcid ac 20 mg one at bedtime until return. GERD diet  Dulera 200 2bid Work on hfa   09/08/2013 acute  ov/[name] re: asthma/ cough > sob Chief Complaint  Patient presents with  . Acute Visit    Pt c/o increased cough, SOB and wheezing x 4 days. Cough is prod with white sputum. The past few days she has been waking up SOB and having to use albuterol nebs.   no sob p nebs/ still violent cough though no excess mucus rec Depromedrol 120 mg today Nexium  40 mg   Take 30-60 min before first meal of the day and Pepcid 20 mg one bedtime until return to office - this is the best way to tell whether stomach acid is contributing to your problem.   Stop hyzar and use valsartan 160/ 25 one daily  For short of breath > nebulizer every 4 hours For cough > hydrocone cough syrup. Once cough better use dulera Take 2 puffs first thing in am and then another 2 puffs about 12 hours later.  Please schedule a follow up office visit in 4 weeks,  sooner if needed with pfts on return > did not return, request switch docs   03/21/14 [name] ov rec Please stop Aerospan.  Continue to have albuterol available to use if needed for shortness of breath.  We will treat you with levaquin 500mg  daily for 7 day Take tussionex as directed.  Start fexofenadine 180mg  daily Start fluticasone nasal spray, 2 sprays each nostril daily Start nexium 40mg  twice a day for 10 days, then decrease to once a day    03/29/2014 f/u ov/[name] re: pseudoasthma Chief Complaint  Patient presents with  . Acute Visit    Pt c/o increased cough- prod with yellow to brown sputum.  She also wheezing and increased SOB. She is using nebulizer with albuterol at least 5 times per day.   she does report improving p depomedrol but only for a few days then gradually back to baseline cough and sob, not clear she followed any of the instructions given.  Mucus turned clear on levaquin then back to brown w/in a few days of stopping it.  rec Continue to use the albuterol up to every 4 hours as needed but the goal is to minimize need for this  We will treat you with levaquin 500mg   daily for 7 day Take delsym two tsp every 12 hours and supplement if needed with hydrocodone  up to 2 every 4 hours to suppress the urge to cough. Swallowing water or using ice chips/non mint and menthol containing candies (such as lifesavers or sugarless jolly ranchers) are also effective.  You should rest your voice and avoid activities that you know make you cough.Once you have eliminated the cough for 3 straight days try reducing the hydrocodone first,  then the delsym as tolerated.   fexofenadine 180mg  daily Start fluticasone nasal spray, 2 sprays each nostril daily Start nexium 40mg  Take 30- 60 min before your first and last meals of the day  Prednisone 10 mg take  4 each am x 2 days,   2 each am x 2 days,  1 each am x 2 days and stop  Use the flutter valve as much as possible  GERD  Diet     09/12/2014 f/u ov/[name] re: sick since January 2015  Chief Complaint  Patient presents with  . Acute Visit    pt has had cough,wheezing and chest tightness for the past month, Pt says mucus is green/ to brownish in color.  says never really cough free x > one year, confused with maint vs prns    No obvious patterns in day to day or daytime variabilty or  cp or   overt sinus or hb symptoms. No unusual exp hx or h/o childhood pna/ asthma or knowledge of premature birth.  Sleeping ok without nocturnal  or early am exacerbation  of respiratory  c/o's or need for noct saba. Also denies any obvious fluctuation of symptoms with weather or environmental changes or other aggravating or alleviating factors except as outlined above   Current Medications, Allergies, Complete Past Medical History, Past Surgical History, Family History, and Social History were reviewed in [employer] record.  ROS  The following are not active complaints unless bolded sore throat, dysphagia, dental problems, itching, sneezing,  nasal congestion or excess/ purulent secretions, ear ache,   fever, chills, sweats, unintended wt loss, pleuritic or exertional cp, hemoptysis,  orthopnea pnd or leg swelling, presyncope, palpitations, heartburn, abdominal pain, anorexia, nausea, vomiting, diarrhea  or change in bowel or urinary habits, change in stools or urine, dysuria,hematuria,  rash, arthralgias, visual complaints, headache, numbness weakness or ataxia or problems with walking or coordination,  change in mood/affect or memory.           Objective:   Physical Exam  amb depressed/anxious bf nad with extremely harsh upper airway coughing fits  09/08/2013        215  > 03/29/2014  199 > 09/12/14  Wt Readings from Last 3 Encounters:  08/02/13 219 lb 9.6 oz (99.61 kg)  07/26/13 219 lb 12.8 oz (99.701 kg)  07/14/13 215 lb 4 oz (97.637 kg)      HEENT: nl dentition, turbinates, and orophanx. Nl external  ear canals without cough reflex   NECK :  without JVD/Nodes/TM/ nl carotid upstrokes bilaterally   LUNGS: no acc muscle use,  Very prominent wheeze/ pseudowheeze   CV:  RRR  no s3 or murmur or increase in P2, no edema   ABD:  soft and nontender with nl excursion in the supine position. No bruits or organomegaly, bowel sounds nl  MS:  warm without deformities, calf tenderness, cyanosis or clubbing  SKIN: warm and dry without lesions    NEURO:  alert, approp, no deficits  Assessment & Plan:

## 2014-09-12 NOTE — Patient Instructions (Addendum)
omnicef 300 mg one twice daily x 10 days   Use the flutter valve as much as you can   For cough mucinex dm up to 1200 mg every 12 hours and supplement with narco up to 2 every 4 hours until no longer coughing    Stop hyzar  Start avapro 150 mg one daily   See Tammy NP w/in 2 weeks with all your medications, even over the counter meds, separated in two separate bags, the ones you take no matter what vs the ones you stop once you feel better and take only as needed when you feel you need them.   Tammy  will generate for you a new user friendly medication calendar that will put [place] all on the same page re: your medication use.     Without this process, it simply isn't possible to assure that we are providing  your outpatient care  with  the attention to detail we feel you deserve.   If we cannot assure that you're getting that kind of care,  then we cannot manage your problem effectively from this clinic.  Once you have seen Tammy and we are sure that we're all on the same page with your medication use she will arrange follow up with me.  Late add:  Needs sinus ct/ allergy profile then on return and I will see back to schedule methacholine next step after verify she's following strict outpt rx

## 2014-09-14 ENCOUNTER — Telehealth: Payer: Self-pay | Admitting: Family

## 2014-09-16 ENCOUNTER — Encounter: Payer: Self-pay | Admitting: Internal Medicine

## 2014-09-16 NOTE — Assessment & Plan Note (Signed)
I had an extended discussion with the patient reviewing all relevant studies completed to date and  lasting 15 to 20 minutes of a 25 minute visit on the following ongoing concerns:    1) The standardized cough guidelines published in Chest by Lissa Morales in 2006 are still the best available and consist of a multiple step process (up to 12!) , not a single office visit,  and are intended  to address this problem logically,  with an alogrithm dependent on response to empiric treatment at  each progressive step  to determine a specific diagnosis with  minimal addtional testing needed. Therefore if adherence is an issue or can't be accurately verified,  it's very unlikely the standard evaluation and treatment will be successful here.    Furthermore, response to therapy (other than acute cough suppression, which should only be used short term with avoidance of narcotic containing cough syrups if possible), can be a gradual process for which the patient may perceive immediate benefit.  Unlike going to an eye doctor where the best perscription is almost always the first one and is immediately effective, this is almost never the case in the management of chronic cough syndromes. Therefore the patient needs to commit up front to consistently adhere to recommendations  for up to 6 weeks of therapy directed at the likely underlying problem(s) before the response can be reasonably evaluated.   2) first step is rx as sinusitis with omnicef then return for a trust but verify meds of and do accurate med rec.   To keep things simple, I have asked the patient to first separate medicines that are perceived as maintenance, that is to be taken daily "no matter what", from those medicines that are taken on only on an as-needed basis and I have given the patient examples of both, and then return to see our NP to generate a  detailed  medication calendar which should be followed until the next physician sees the patient and  updates it.     See instructions for specific recommendations which were reviewed directly with the patient who was given a copy with highlighter outlining the key components.   Marland Kitchen

## 2014-09-16 NOTE — Assessment & Plan Note (Addendum)
Changed cozar to valsartan 09/09/2013 ? Improved    For reasons that may related to vascular permability and nitric oxide pathways but not elevated  bradykinin levels (as seen with  ACEi use) losartan in the generic form has been reported now from mulitple sources  to cause a similar pattern of non-specific  upper airway symptoms as seen with acei.   This has not been reported with exposure to the other ARB's to date, so it seems reasonable for now to try either generic diovan or avapro if ARB needed or use an alternative class altogether.  See:  Lelon Frohlich Allergy Asthma Immunol  2008: 101: p [phone]    rechallenge with arb = avapro 150 mg daily

## 2014-09-21 NOTE — Telephone Encounter (Signed)
error 

## 2014-09-22 ENCOUNTER — Other Ambulatory Visit: Payer: Self-pay | Admitting: Adult Health

## 2014-09-22 DIAGNOSIS — Z1231 Encounter for screening mammogram for malignant neoplasm of breast: Secondary | ICD-10-CM

## 2014-10-03 ENCOUNTER — Other Ambulatory Visit (INDEPENDENT_AMBULATORY_CARE_PROVIDER_SITE_OTHER): Payer: 59

## 2014-10-03 ENCOUNTER — Encounter: Payer: Self-pay | Admitting: Adult Health

## 2014-10-03 ENCOUNTER — Encounter (INDEPENDENT_AMBULATORY_CARE_PROVIDER_SITE_OTHER): Payer: Self-pay

## 2014-10-03 ENCOUNTER — Ambulatory Visit (INDEPENDENT_AMBULATORY_CARE_PROVIDER_SITE_OTHER): Payer: 59 | Admitting: Adult Health

## 2014-10-03 VITALS — BP 132/90 | HR 93 | Ht 66.0 in | Wt 214.0 lb

## 2014-10-03 DIAGNOSIS — R05 Cough: Secondary | ICD-10-CM | POA: Diagnosis not present

## 2014-10-03 DIAGNOSIS — R058 Other specified cough: Secondary | ICD-10-CM

## 2014-10-03 LAB — CBC WITH DIFFERENTIAL/PLATELET
Basophils Absolute: 0 10*3/uL (ref 0.0–0.1)
Basophils Relative: 0.3 % (ref 0.0–3.0)
Eosinophils Absolute: 0.5 10*3/uL (ref 0.0–0.7)
Eosinophils Relative: 3.4 % (ref 0.0–5.0)
HCT: 34.2 % — ABNORMAL LOW (ref 36.0–46.0)
Hemoglobin: 11.6 g/dL — ABNORMAL LOW (ref 12.0–15.0)
Lymphocytes Relative: 16.8 % (ref 12.0–46.0)
Lymphs Abs: 2.4 10*3/uL (ref 0.7–4.0)
MCHC: 33.8 g/dL (ref 30.0–36.0)
MCV: 82.9 fl (ref 78.0–100.0)
Monocytes Absolute: 0.5 10*3/uL (ref 0.1–1.0)
Monocytes Relative: 3.4 % (ref 3.0–12.0)
Neutro Abs: 10.8 10*3/uL — ABNORMAL HIGH (ref 1.4–7.7)
Neutrophils Relative %: 76.1 % (ref 43.0–77.0)
Platelets: 406 10*3/uL — ABNORMAL HIGH (ref 150.0–400.0)
RBC: 4.13 Mil/uL (ref 3.87–5.11)
RDW: 15.3 % (ref 11.5–15.5)
WBC: 14.2 10*3/uL — ABNORMAL HIGH (ref 4.0–10.5)

## 2014-10-03 MED ORDER — HYDROCODONE-ACETAMINOPHEN 5-325 MG PO TABS: 1.0000 | ORAL_TABLET | ORAL | Status: DC | PRN

## 2014-10-03 NOTE — Assessment & Plan Note (Signed)
Chronic cough ? 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 Computerized medication calendar was adjusted/completed   Plan  Follow med calendar closely and bring to each visit .  We are setting you up for labs and CT sinus .  Stop use Dulera .  ProAir and Albuterol are your rescue inhalers to be used if needed.  Restart Pepcid 20mg  At bedtime   Follow up Dr. Melvyn Novas  In 4 weeks and As needed   Please contact office for sooner follow up if symptoms do not improve or worsen or seek emergency care

## 2014-10-03 NOTE — Progress Notes (Signed)
Subjective:    Patient ID: Gloria [name], female    DOB: 1951-04-05    MRN: [id number]    Brief patient profile:  61 yobf sickly child (? Pna, whooping cough, asthma) never full aerobic tolerance then better ages 35- 53 and no daily rx or need to go doctor and self rx with otcs like nyquil then much  Worse since 09/29/04 when mother died and since then freq prns including  albuterol neb worse since 2011/09/30 and needing daily dulera 200 (though admits not consistent with it) referred 08/02/2013 by Dr Basilio Cairo for chronic variable cough worse since Jan 2015    History of Present Illness  08/02/2013 1st [facility] office visit/ Wert cc min prod (always white mucus) cough every day esp in am x sev years better on prednisone but "gained too much wt" from baseline 180  - wakes up daily 3 am smothering / hacking > goes to [facility] and uses dulera and helps and props up head and goes back to bed and then wakes up s flare. Also neb maybe once a month. Worse with pollen exp x 2 years  rec Pepcid ac 20 mg one at bedtime until return. GERD diet  Dulera 200 2bid Work on hfa   09/08/2013 acute  ov/Wert re: asthma/ cough > sob Chief Complaint  Patient presents with  . Acute Visit    Pt c/o increased cough, SOB and wheezing x 4 days. Cough is prod with white sputum. The past few days she has been waking up SOB and having to use albuterol nebs.   no sob p nebs/ still violent cough though no excess mucus rec Depromedrol 120 mg today Nexium  40 mg   Take 30-60 min before first meal of the day and Pepcid 20 mg one bedtime until return to office - this is the best way to tell whether stomach acid is contributing to your problem.   Stop hyzar and use valsartan 160/ 25 one daily  For short of breath > nebulizer every 4 hours For cough > hydrocone cough syrup. Once cough better use dulera Take 2 puffs first thing in am and then another 2 puffs about 12 hours later.  Please schedule a follow up office visit in 4 weeks,  sooner if needed with pfts on return > did not return, request switch docs   03/21/14 [name] ov rec Please stop Aerospan.  Continue to have albuterol available to use if needed for shortness of breath.  We will treat you with levaquin 500mg  daily for 7 day Take tussionex as directed.  Start fexofenadine 180mg  daily Start fluticasone nasal spray, 2 sprays each nostril daily Start nexium 40mg  twice a day for 10 days, then decrease to once a day    03/29/2014 f/u ov/Wert re: pseudoasthma Chief Complaint  Patient presents with  . Acute Visit    Pt c/o increased cough- prod with yellow to brown sputum.  She also wheezing and increased SOB. She is using nebulizer with albuterol at least 5 times per day.   she does report improving p depomedrol but only for a few days then gradually back to baseline cough and sob, not clear she followed any of the instructions given.  Mucus turned clear on levaquin then back to brown w/in a few days of stopping it.  rec Continue to use the albuterol up to every 4 hours as needed but the goal is to minimize need for this  We will treat you with levaquin 500mg   daily for 7 day Take delsym two tsp every 12 hours and supplement if needed with hydrocodone  up to 2 every 4 hours to suppress the urge to cough. Swallowing water or using ice chips/non mint and menthol containing candies (such as lifesavers or sugarless jolly ranchers) are also effective.  You should rest your voice and avoid activities that you know make you cough.Once you have eliminated the cough for 3 straight days try reducing the hydrocodone first,  then the delsym as tolerated.   fexofenadine 180mg  daily Start fluticasone nasal spray, 2 sprays each nostril daily Start nexium 40mg  Take 30- 60 min before your first and last meals of the day  Prednisone 10 mg take  4 each am x 2 days,   2 each am x 2 days,  1 each am x 2 days and stop  Use the flutter valve as much as possible  GERD  Diet     09/12/2014 f/u ov/Wert re: sick since January 2015  Chief Complaint  Patient presents with  . Acute Visit    pt has had cough,wheezing and chest tightness for the past month, Pt says mucus is green/ to brownish in color.  says never really cough free x > one year, confused with maint vs prns >>omnicef x 10 d and   10/03/2014 Follow up : Asthma vs psuedoasthma /chronic cough  Pt returns for  2 week follow up and med review .  She is feeling better but cough is not completely better.  Flare of AEAB last ov ,  tx w/ omnicef x 10 d. Changed from hyzaar to avapro /hctz.  Says norco helps and wants refill, discussed the side effects and addiction problems with narcotics.  Advised will give limited refills in hopes to control cough by other means.  B/p is doing okay.  We reviewed all her meds and organized them into a med calendar with pt education  Stopped pepcid . Discussed restarting.  No chest pain, orthopnea, edema or fever.  Workup has shown : NML  PFT w/ no sign BD response . 05/2014  nml cXR 03/2014  Using Dulera As needed  , discussed proair as rescue.    Current Medications, Allergies, Complete Past Medical History, Past Surgical History, Family History, and Social History were reviewed in [employer] record.  ROS  The following are not active complaints unless bolded sore throat, dysphagia, dental problems, itching, sneezing,  nasal congestion or excess/ purulent secretions, ear ache,   fever, chills, sweats, unintended wt loss, pleuritic or exertional cp, hemoptysis,  orthopnea pnd or leg swelling, presyncope, palpitations, heartburn, abdominal pain, anorexia, nausea, vomiting, diarrhea  or change in bowel or urinary habits, change in stools or urine, dysuria,hematuria,  rash, arthralgias, visual complaints, headache, numbness weakness or ataxia or problems with walking or coordination,  change in mood/affect or memory.           Objective:   Physical  Exam nad   09/08/2013        215  > 03/29/2014  199 > 09/12/14 ?>214 10/03/2014                 HEENT: nl dentition, turbinates, and orophanx. Nl external ear canals without cough reflex   NECK :  without JVD/Nodes/TM/ nl carotid upstrokes bilaterally   LUNGS: no acc muscle use,  Mild psuedowheezing   CV:  RRR  no s3 or murmur or increase in P2, no edema   ABD:  soft and  nontender with nl excursion in the supine position. No bruits or organomegaly, bowel sounds nl  MS:  warm without deformities, calf tenderness, cyanosis or clubbing  SKIN: warm and dry without lesions    NEURO:  alert, approp, no deficits              Assessment & Plan:

## 2014-10-03 NOTE — Patient Instructions (Addendum)
Follow med calendar closely and bring to each visit .  We are setting you up for labs and CT sinus .  Stop use Dulera .  ProAir and Albuterol are your rescue inhalers to be used if needed.  Restart Pepcid 20mg  At bedtime   Follow up Dr. Melvyn Novas  In 4 weeks and As needed   Please contact office for sooner follow up if symptoms do not improve or worsen or seek emergency care

## 2014-10-04 ENCOUNTER — Ambulatory Visit (HOSPITAL_COMMUNITY)
Admission: RE | Admit: 2014-10-04 | Discharge: 2014-10-04 | Disposition: A | Discharge status: Home / Self Care | Payer: 59 | Source: Ambulatory Visit | Attending: Adult Health | Admitting: Adult Health

## 2014-10-04 DIAGNOSIS — Z1231 Encounter for screening mammogram for malignant neoplasm of breast: Secondary | ICD-10-CM

## 2014-10-04 LAB — ALLERGY FULL PROFILE
Allergen, D pternoyssinus,d7: <0.1 kU/L
Allergen,Goose feathers, e70: <0.1 kU/L
Alternaria Alternata: <0.1 kU/L
Aspergillus fumigatus, m3: <0.1 kU/L
Bahia Grass: <0.1 kU/L
Bermuda Grass: <0.1 kU/L
Box Elder IgE: <0.1 kU/L
Candida Albicans: <0.1 kU/L
Cat Dander: <0.1 kU/L
Common Ragweed: <0.1 kU/L
Curvularia lunata: <0.1 kU/L
D. farinae: <0.1 kU/L
Dog Dander: <0.1 kU/L
Elm IgE: <0.1 kU/L
Fescue: <0.1 kU/L
G005 Rye, Perennial: <0.1 kU/L
G009 Red Top: <0.1 kU/L
Goldenrod: <0.1 kU/L
Helminthosporium halodes: <0.1 kU/L
House Dust Hollister: <0.1 kU/L
IgE (Immunoglobulin E), Serum: 85 kU/L (ref <115)
Lamb's Quarters: <0.1 kU/L
Oak: <0.1 kU/L
Plantain: <0.1 kU/L
Stemphylium Botryosum: <0.1 kU/L
Sycamore Tree: <0.1 kU/L
Timothy Grass: <0.1 kU/L

## 2014-10-06 NOTE — Progress Notes (Signed)
Quick Note:  Called and spoke to pt. Informed pt of the results and recs per TP. Pt aware to f/u with PCP. Pt stated her PCP is Dorothyann Peng, NP. Labs sent to NP. Pt verbalized understanding and denied any further questions or concerns at this time.   ______

## 2014-10-07 NOTE — Addendum Note (Signed)
Addended by: Parke Poisson E on: 10/07/2014 01:54 PM   Modules accepted: Orders, Medications

## 2014-10-11 ENCOUNTER — Ambulatory Visit (INDEPENDENT_AMBULATORY_CARE_PROVIDER_SITE_OTHER)
Admission: RE | Admit: 2014-10-11 | Discharge: 2014-10-11 | Disposition: A | Discharge status: Home / Self Care | Payer: 59 | Source: Ambulatory Visit | Attending: Adult Health | Admitting: Adult Health

## 2014-10-11 DIAGNOSIS — R05 Cough: Secondary | ICD-10-CM

## 2014-10-11 DIAGNOSIS — R058 Other specified cough: Secondary | ICD-10-CM

## 2014-10-12 ENCOUNTER — Other Ambulatory Visit: Payer: Self-pay | Admitting: Adult Health

## 2014-10-12 MED ORDER — CEFUROXIME AXETIL 500 MG PO TABS: 500.0000 mg | ORAL_TABLET | Freq: Two times a day (BID) | ORAL | Status: DC

## 2014-10-17 ENCOUNTER — Ambulatory Visit (INDEPENDENT_AMBULATORY_CARE_PROVIDER_SITE_OTHER): Payer: 59 | Admitting: Adult Health

## 2014-10-17 ENCOUNTER — Encounter: Payer: Self-pay | Admitting: Adult Health

## 2014-10-17 VITALS — BP 128/80 | HR 111 | Temp 97.5°F | Ht 66.0 in | Wt 213.1 lb

## 2014-10-17 DIAGNOSIS — G43001 Migraine without aura, not intractable, with status migrainosus: Secondary | ICD-10-CM

## 2014-10-17 DIAGNOSIS — G43701 Chronic migraine without aura, not intractable, with status migrainosus: Secondary | ICD-10-CM | POA: Diagnosis not present

## 2014-10-17 DIAGNOSIS — Z7189 Other specified counseling: Secondary | ICD-10-CM

## 2014-10-17 DIAGNOSIS — J45909 Unspecified asthma, uncomplicated: Secondary | ICD-10-CM | POA: Insufficient documentation

## 2014-10-17 DIAGNOSIS — J4520 Mild intermittent asthma, uncomplicated: Secondary | ICD-10-CM | POA: Diagnosis not present

## 2014-10-17 DIAGNOSIS — E1165 Type 2 diabetes mellitus with hyperglycemia: Secondary | ICD-10-CM | POA: Diagnosis not present

## 2014-10-17 DIAGNOSIS — IMO0002 Reserved for concepts with insufficient information to code with codable children: Secondary | ICD-10-CM

## 2014-10-17 DIAGNOSIS — Z7689 Persons encountering health services in other specified circumstances: Secondary | ICD-10-CM

## 2014-10-17 MED ORDER — ALBUTEROL SULFATE (2.5 MG/3ML) 0.083% IN NEBU: 2.5000 mg | INHALATION_SOLUTION | RESPIRATORY_TRACT | Status: DC | PRN

## 2014-10-17 MED ORDER — RIZATRIPTAN BENZOATE 10 MG PO TABS: 10.0000 mg | ORAL_TABLET | ORAL | Status: DC | PRN

## 2014-10-17 MED ORDER — KETOROLAC TROMETHAMINE 60 MG/2ML IM SOLN
60.0000 mg | Freq: Once | INTRAMUSCULAR | Status: AC
  Administered 2014-10-17: 60 mg via INTRAMUSCULAR

## 2014-10-17 MED ORDER — MOMETASONE FURO-FORMOTEROL FUM 100-5 MCG/ACT IN AERO: 2.0000 | INHALATION_SPRAY | Freq: Every morning | RESPIRATORY_TRACT | Status: DC

## 2014-10-17 MED ORDER — METFORMIN HCL 500 MG PO TABS: 500.0000 mg | ORAL_TABLET | Freq: Two times a day (BID) | ORAL | Status: DC

## 2014-10-17 NOTE — Patient Instructions (Addendum)
It was a pleasure meeting you. I have sent in prescriptions for Dulera, Albuteral Neb and Metformin. Please take th Metformin twice a day. If can cause mild nausea and diarrhea, let me know if this happens. Continue to work on improving your diet and start an exercise regimen. We will check your A1C in July during your physical. Keep a log of your blood sugars and bring them with you to your doctors appointment.   Diabetes Mellitus and Food It is important for you to manage your blood sugar (glucose) level. Your blood glucose level can be greatly affected by what you eat. Eating healthier foods in the appropriate amounts throughout the day at about the same time each day will help you control your blood glucose level. It can also help slow or prevent worsening of your diabetes mellitus. Healthy eating may even help you improve the level of your blood pressure and reach or maintain a healthy weight.  HOW CAN FOOD AFFECT ME? Carbohydrates Carbohydrates affect your blood glucose level more than any other type of food. Your dietitian will help you determine how many carbohydrates to eat at each meal and teach you how to count carbohydrates. Counting carbohydrates is important to keep your blood glucose at a healthy level, especially if you are using insulin or taking certain medicines for diabetes mellitus. Alcohol Alcohol can cause sudden decreases in blood glucose (hypoglycemia), especially if you use insulin or take certain medicines for diabetes mellitus. Hypoglycemia can be a life-threatening condition. Symptoms of hypoglycemia (sleepiness, dizziness, and disorientation) are similar to symptoms of having too much alcohol.  If your health care provider has given you approval to drink alcohol, do so in moderation and use the following guidelines:  Women should not have more than one drink per day, and men should not have more than two drinks per day. One drink is equal to:  12 oz of beer.  5 oz of  wine.  1 oz of hard liquor.  Do not drink on an empty stomach.  Keep yourself hydrated. Have water, diet soda, or unsweetened iced tea.  Regular soda, juice, and other mixers might contain a lot of carbohydrates and should be counted. WHAT FOODS ARE NOT RECOMMENDED? As you make food choices, it is important to remember that all foods are not the same. Some foods have fewer nutrients per serving than other foods, even though they might have the same number of calories or carbohydrates. It is difficult to get your body what it needs when you eat foods with fewer nutrients. Examples of foods that you should avoid that are high in calories and carbohydrates but low in nutrients include:  Trans fats (most processed foods list trans fats on the Nutrition Facts label).  Regular soda.  Juice.  Candy.  Sweets, such as cake, pie, doughnuts, and cookies.  Fried foods. WHAT FOODS CAN I EAT? Have nutrient-rich foods, which will nourish your body and keep you healthy. The food you should eat also will depend on several factors, including:  The calories you need.  The medicines you take.  Your weight.  Your blood glucose level.  Your blood pressure level.  Your cholesterol level. You also should eat a variety of foods, including:  Protein, such as meat, poultry, fish, tofu, nuts, and seeds (lean animal proteins are best).  Fruits.  Vegetables.  Dairy products, such as milk, cheese, and yogurt (low fat is best).  Breads, grains, pasta, cereal, rice, and beans.  Fats such as olive  oil, trans fat-free margarine, canola oil, avocado, and olives. DOES EVERYONE WITH DIABETES MELLITUS HAVE THE SAME MEAL PLAN? Because every person with diabetes mellitus is different, there is not one meal plan that works for everyone. It is very important that you meet with a dietitian who will help you create a meal plan that is just right for you. Document Released: 03/14/2005 Document Revised:  06/22/2013 Document Reviewed: 05/14/2013 [facility] Patient Information 2015 [place], [place]. This information is not intended to replace advice given to you by your health care provider. Make sure you discuss any questions you have with your health care provider. Diabetes Mellitus and Food It is important for you to manage your blood sugar (glucose) level. Your blood glucose level can be greatly affected by what you eat. Eating healthier foods in the appropriate amounts throughout the day at about the same time each day will help you control your blood glucose level. It can also help slow or prevent worsening of your diabetes mellitus. Healthy eating may even help you improve the level of your blood pressure and reach or maintain a healthy weight.  HOW CAN FOOD AFFECT ME? Carbohydrates Carbohydrates affect your blood glucose level more than any other type of food. Your dietitian will help you determine how many carbohydrates to eat at each meal and teach you how to count carbohydrates. Counting carbohydrates is important to keep your blood glucose at a healthy level, especially if you are using insulin or taking certain medicines for diabetes mellitus. Alcohol Alcohol can cause sudden decreases in blood glucose (hypoglycemia), especially if you use insulin or take certain medicines for diabetes mellitus. Hypoglycemia can be a life-threatening condition. Symptoms of hypoglycemia (sleepiness, dizziness, and disorientation) are similar to symptoms of having too much alcohol.  If your health care provider has given you approval to drink alcohol, do so in moderation and use the following guidelines:  Women should not have more than one drink per day, and men should not have more than two drinks per day. One drink is equal to:  12 oz of beer.  5 oz of wine.  1 oz of hard liquor.  Do not drink on an empty stomach.  Keep yourself hydrated. Have water, diet soda, or unsweetened iced tea.  Regular soda,  juice, and other mixers might contain a lot of carbohydrates and should be counted. WHAT FOODS ARE NOT RECOMMENDED? As you make food choices, it is important to remember that all foods are not the same. Some foods have fewer nutrients per serving than other foods, even though they might have the same number of calories or carbohydrates. It is difficult to get your body what it needs when you eat foods with fewer nutrients. Examples of foods that you should avoid that are high in calories and carbohydrates but low in nutrients include:  Trans fats (most processed foods list trans fats on the Nutrition Facts label).  Regular soda.  Juice.  Candy.  Sweets, such as cake, pie, doughnuts, and cookies.  Fried foods. WHAT FOODS CAN I EAT? Have nutrient-rich foods, which will nourish your body and keep you healthy. The food you should eat also will depend on several factors, including:  The calories you need.  The medicines you take.  Your weight.  Your blood glucose level.  Your blood pressure level.  Your cholesterol level. You also should eat a variety of foods, including:  Protein, such as meat, poultry, fish, tofu, nuts, and seeds (lean animal proteins are best).  Fruits.  Vegetables.  Dairy products, such as milk, cheese, and yogurt (low fat is best).  Breads, grains, pasta, cereal, rice, and beans.  Fats such as olive oil, trans fat-free margarine, canola oil, avocado, and olives. DOES EVERYONE WITH DIABETES MELLITUS HAVE THE SAME MEAL PLAN? Because every person with diabetes mellitus is different, there is not one meal plan that works for everyone. It is very important that you meet with a dietitian who will help you create a meal plan that is just right for you. Document Released: 03/14/2005 Document Revised: 06/22/2013 Document Reviewed: 05/14/2013 [facility] Patient Information 2015 [place], [place]. This information is not intended to replace advice given to you by your  health care provider. Make sure you discuss any questions you have with your health care provider.

## 2014-10-17 NOTE — Progress Notes (Signed)
HPI:  Gloria [name] is here to establish care.  Last PCP and physical:In July with NP Megan Salon Last eye exam was 4-5 years ago Dentist: 09/2014 GYN: Denies having one. Hysterectomy.  Mammogram: 09/2014  Diet: Veggies. Processed foods. Eat out a lot Exericse:Does not exercise.   Has the following chronic problems that require follow up and concerns today:  Asthma- Followed by Pulmonology.   Migraines - Since childhood. Continues to have more frequent issues, commonly over the last three months. Her last migraine was in March 4th and went to the ER and was given dilaudid. She says that it worked for her pain. She would like more hydrocodone for "break through pain."   Diabetes Last two A1c's were 7.0. She is not eating well and does not exercise. She is not compliant on checking her blood sugars.     ROS negative for unless reported above: fevers, unintentional weight loss, hearing or vision loss, chest pain, palpitations, struggling to breath, hemoptysis, melena, hematochezia, hematuria, falls, loc, si, thoughts of self harm  Past Medical History  Diagnosis Date  . Depressive disorder, not elsewhere classified   . Other and unspecified hyperlipidemia   . Unspecified essential hypertension   . Migraine, unspecified, without mention of intractable migraine without mention of status migrainosus   . Unspecified urinary incontinence   . Arthritis   . Chronic bronchitis   . Anxiety state, unspecified   . Postmenopausal symptoms   . Panic attacks   . Allergic rhinitis, cause unspecified 03/30/2013  . Borderline diabetes mellitus     a1c 7.0 (07/26/13)     Past Surgical History  Procedure Laterality Date  . Abdominal hysterectomy  2002  . Dislocated shoulder      right  . Diagnostic laparoscopy    . Tumor right ankle      Family History  Problem Relation Age of Onset  . Arthritis      family history  . Hypertension      grandparent  . Colon cancer Maternal Grandfather    . Asthma Son     History   Social History  . Marital Status: Divorced    Spouse Name: N/A  . Number of Children: 1  . Years of Education: N/A   Occupational History  . Self employed-interior design/flower arrangements    Social History Main Topics  . Smoking status: Never Smoker   . Smokeless tobacco: Never Used  . Alcohol Use: No  . Drug Use: No  . Sexual Activity: Not Currently   Other Topics Concern  . None   Social History Narrative   -Retired - does Company secretary   -Divorced in 2007   -One son - lives locally. 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 and entertain family. Bowling. Interior decorating           Current outpatient prescriptions:  .  albuterol (PROVENTIL HFA;VENTOLIN HFA) 108 (90 BASE) MCG/ACT inhaler, Inhale 2 puffs into the lungs every 4 (four) hours as needed for wheezing or shortness of breath (((PLAN B)))., Disp: , Rfl:  .  albuterol (PROVENTIL) (2.5 MG/3ML) 0.083% nebulizer solution, Inhale 3 mLs (2.5 mg total) into the lungs every 4 (four) hours as needed for wheezing or shortness of breath (((PLAN C)))., Disp: 75 mL, Rfl: 2 .  ALPRAZolam (XANAX) 1 MG tablet, Take 1 mg by mouth daily as needed for anxiety., Disp: , Rfl:  .  cefUROXime (CEFTIN) 500  MG tablet, Take 1 tablet (500 mg total) by mouth 2 (two) times daily with a meal. Take with meals and probiotic., Disp: 28 tablet, Rfl: 0 .  Cholecalciferol (VITAMIN D-3) 5000 UNITS TABS, Take 1 tablet by mouth daily., Disp: , Rfl:  .  [facility] Lancets MISC, USE TO CHECK BLOOD GLUCOSE ONCE DAILY FASTING, Disp: 200 each, Rfl: 0 .  Cyanocobalamin (VITAMIN B 12 PO), Take 1 capsule by mouth daily.  , Disp: , Rfl:  .  dextromethorphan-guaiFENesin (MUCINEX DM) 30-600 MG per 12 hr tablet, Take 1 tablet by mouth 2 (two) times daily as needed (w/ flutter valve)., Disp: , Rfl:  .  famotidine (PEPCID) 20 MG tablet, Take 20 mg by mouth at bedtime., Disp: , Rfl:  .  fexofenadine  (ALLEGRA) 180 MG tablet, Take 1 tablet (180 mg total) by mouth daily., Disp: 30 tablet, Rfl: 5 .  fluticasone (FLONASE) 50 MCG/ACT nasal spray, Place 2 sprays into both nostrils daily as needed for allergies or rhinitis., Disp: , Rfl:  .  glucose blood (ACCU-CHEK AVIVA) test strip, Use as instructed, Disp: 100 each, Rfl: 12 .  Green Tea 200 MG CAPS, Take 1 capsule by mouth daily., Disp: , Rfl:  .  hydrochlorothiazide (HYDRODIURIL) 50 MG tablet, TAKE 1 TABLET BY MOUTH EVERY DAY, Disp: 30 tablet, Rfl: 5 .  HYDROcodone-acetaminophen (NORCO/VICODIN) 5-325 MG per tablet, Take 1-2 tablets by mouth every 4 (four) hours as needed., Disp: 40 tablet, Rfl: 0 .  irbesartan (AVAPRO) 150 MG tablet, Take 1 tablet (150 mg total) by mouth daily., Disp: 30 tablet, Rfl: 5 .  Multiple Vitamin (MULTIVITAMIN) tablet, Take 1 tablet by mouth daily., Disp: , Rfl:  .  omeprazole (PRILOSEC) 40 MG capsule, Take 1 capsule (40 mg total) by mouth daily., Disp: 30 capsule, Rfl: 11 .  ondansetron (ZOFRAN ODT) 4 MG disintegrating tablet, Take 1 tablet (4 mg total) by mouth every 8 (eight) hours as needed for nausea or vomiting., Disp: 20 tablet, Rfl: 0 .  phenazopyridine (PYRIDIUM) 95 MG tablet, Take 95 mg by mouth daily., Disp: , Rfl:  .  Pyridoxine HCl (VITAMIN B-6) 250 MG tablet, Take 250 mg by mouth daily., Disp: , Rfl:  .  Respiratory Therapy Supplies (FLUTTER) DEVI, Use as directed, Disp: 1 each, Rfl: 0 .  SUMAtriptan (IMITREX) 25 MG tablet, 1 at the first sign of migraine, can take one more 2 hours later. No more then 2 in 24 hours., Disp: 10 tablet, Rfl: 0 .  zaleplon (SONATA) 10 MG capsule, Take 1 capsule by mouth at bedtime as needed., Disp: , Rfl: 5 .  metFORMIN (GLUCOPHAGE) 500 MG tablet, Take 1 tablet (500 mg total) by mouth 2 (two) times daily with a meal., Disp: 180 tablet, Rfl: 3 .  mometasone-formoterol (DULERA) 100-5 MCG/ACT AERO, Inhale 2 puffs into the lungs every morning., Disp: 8.8 g, Rfl: 3  EXAM:  Filed  Vitals:   10/17/14 1009  BP: 128/80  Pulse: 111  Temp: 97.5 F (36.4 C)    Body mass index is 34.41 kg/(m^2).  GENERAL: vitals reviewed and listed above, alert, oriented, appears well hydrated and in no acute distress  HEENT: atraumatic, conjunttiva clear, no obvious abnormalities on inspection of external nose and ears. No pain to palpation over sinus cavities  NECK: no obvious masses on inspection  LUNGS: clear to auscultation bilaterally, no wheezes, rales or rhonchi, good air movement.   CV: HRRR, no peripheral edema. No carotid bruit.   MS: moves all extremities  without noticeable abnormality  PSYCH: pleasant and cooperative, no obvious depression or anxiety  ASSESSMENT AND PLAN:  Discussed the following assessment and plan:  1. Chronic migraine without aura with status migrainosus, not intractable  - Ambulatory referral to Neurology - ketorolac (TORADOL) injection 60 mg; Inject 2 mLs (60 mg total) into the muscle once. - Will prescribe Maxalt to see if it works better for her than Imitrex - Advised patient that my personal practice was not to prescribe Narcotics for Migraines.  - Spoke about staying away from triggering factors and life style modifications.  - Follow up as needed  2. Asthma, chronic, mild intermittent, uncomplicated - Refilled Dulera and albuteral nebs.  - She was using her Albuteral inhaler multiple times per day. Advised her to not use it unless she needed it. Try using Dulera in the morning.  - Follow up with Pulmonology and myself as needed  3. Diabetes type 2, uncontrolled - Started on Metformin 500mg  BID.  - Will get A1c in July - Advised to keep a blood sugar log - Advised her that she needs to start exercising more and incorporate a diabetic diet. Information given to her on diabetic diet and exercise. - Amb Referral to Nutrition and Diabetic Education - Follow up as needed.   4. Encounter to establish care -Follow up in July for  CPX - Follow up sooner if needed   -We reviewed the PMH, PSH, FH, SH, Meds and Allergies. -We provided refills for any medications we will prescribe as needed. -We addressed current concerns per orders and patient instructions. -We have asked for records for pertinent exams, studies, vaccines and notes from previous providers. -We have advised patient to follow up per instructions below.   -Patient advised to return or notify a provider immediately if symptoms worsen or persist or new concerns arise.  Patient Instructions  It was a pleasure meeting you. I have sent in prescriptions for Dulera, Albuteral Neb and Metformin. Please take th Metformin twice a day. If can cause mild nausea and diarrhea, let me know if this happens. Continue to work on improving your diet and start an exercise regimen. We will check your A1C in July during your physical. Keep a log of your blood sugars and bring them with you to your doctors appointment.   Diabetes Mellitus and Food It is important for you to manage your blood sugar (glucose) level. Your blood glucose level can be greatly affected by what you eat. Eating healthier foods in the appropriate amounts throughout the day at about the same time each day will help you control your blood glucose level. It can also help slow or prevent worsening of your diabetes mellitus. Healthy eating may even help you improve the level of your blood pressure and reach or maintain a healthy weight.  HOW CAN FOOD AFFECT ME? Carbohydrates Carbohydrates affect your blood glucose level more than any other type of food. Your dietitian will help you determine how many carbohydrates to eat at each meal and teach you how to count carbohydrates. Counting carbohydrates is important to keep your blood glucose at a healthy level, especially if you are using insulin or taking certain medicines for diabetes mellitus. Alcohol Alcohol can cause sudden decreases in blood glucose  (hypoglycemia), especially if you use insulin or take certain medicines for diabetes mellitus. Hypoglycemia can be a life-threatening condition. Symptoms of hypoglycemia (sleepiness, dizziness, and disorientation) are similar to symptoms of having too much alcohol.  If your health care provider  has given you approval to drink alcohol, do so in moderation and use the following guidelines:  Women should not have more than one drink per day, and men should not have more than two drinks per day. One drink is equal to:  12 oz of beer.  5 oz of wine.  1 oz of hard liquor.  Do not drink on an empty stomach.  Keep yourself hydrated. Have water, diet soda, or unsweetened iced tea.  Regular soda, juice, and other mixers might contain a lot of carbohydrates and should be counted. WHAT FOODS ARE NOT RECOMMENDED? As you make food choices, it is important to remember that all foods are not the same. Some foods have fewer nutrients per serving than other foods, even though they might have the same number of calories or carbohydrates. It is difficult to get your body what it needs when you eat foods with fewer nutrients. Examples of foods that you should avoid that are high in calories and carbohydrates but low in nutrients include:  Trans fats (most processed foods list trans fats on the Nutrition Facts label).  Regular soda.  Juice.  Candy.  Sweets, such as cake, pie, doughnuts, and cookies.  Fried foods. WHAT FOODS CAN I EAT? Have nutrient-rich foods, which will nourish your body and keep you healthy. The food you should eat also will depend on several factors, including:  The calories you need.  The medicines you take.  Your weight.  Your blood glucose level.  Your blood pressure level.  Your cholesterol level. You also should eat a variety of foods, including:  Protein, such as meat, poultry, fish, tofu, nuts, and seeds (lean animal proteins are  best).  Fruits.  Vegetables.  Dairy products, such as milk, cheese, and yogurt (low fat is best).  Breads, grains, pasta, cereal, rice, and beans.  Fats such as olive oil, trans fat-free margarine, canola oil, avocado, and olives. DOES EVERYONE WITH DIABETES MELLITUS HAVE THE SAME MEAL PLAN? Because every person with diabetes mellitus is different, there is not one meal plan that works for everyone. It is very important that you meet with a dietitian who will help you create a meal plan that is just right for you. Document Released: 03/14/2005 Document Revised: 06/22/2013 Document Reviewed: 05/14/2013 [facility] Patient Information 2015 [place], [place]. This information is not intended to replace advice given to you by your health care provider. Make sure you discuss any questions you have with your health care provider. Diabetes Mellitus and Food It is important for you to manage your blood sugar (glucose) level. Your blood glucose level can be greatly affected by what you eat. Eating healthier foods in the appropriate amounts throughout the day at about the same time each day will help you control your blood glucose level. It can also help slow or prevent worsening of your diabetes mellitus. Healthy eating may even help you improve the level of your blood pressure and reach or maintain a healthy weight.  HOW CAN FOOD AFFECT ME? Carbohydrates Carbohydrates affect your blood glucose level more than any other type of food. Your dietitian will help you determine how many carbohydrates to eat at each meal and teach you how to count carbohydrates. Counting carbohydrates is important to keep your blood glucose at a healthy level, especially if you are using insulin or taking certain medicines for diabetes mellitus. Alcohol Alcohol can cause sudden decreases in blood glucose (hypoglycemia), especially if you use insulin or take certain medicines for diabetes mellitus.  Hypoglycemia can be a  life-threatening condition. Symptoms of hypoglycemia (sleepiness, dizziness, and disorientation) are similar to symptoms of having too much alcohol.  If your health care provider has given you approval to drink alcohol, do so in moderation and use the following guidelines:  Women should not have more than one drink per day, and men should not have more than two drinks per day. One drink is equal to:  12 oz of beer.  5 oz of wine.  1 oz of hard liquor.  Do not drink on an empty stomach.  Keep yourself hydrated. Have water, diet soda, or unsweetened iced tea.  Regular soda, juice, and other mixers might contain a lot of carbohydrates and should be counted. WHAT FOODS ARE NOT RECOMMENDED? As you make food choices, it is important to remember that all foods are not the same. Some foods have fewer nutrients per serving than other foods, even though they might have the same number of calories or carbohydrates. It is difficult to get your body what it needs when you eat foods with fewer nutrients. Examples of foods that you should avoid that are high in calories and carbohydrates but low in nutrients include:  Trans fats (most processed foods list trans fats on the Nutrition Facts label).  Regular soda.  Juice.  Candy.  Sweets, such as cake, pie, doughnuts, and cookies.  Fried foods. WHAT FOODS CAN I EAT? Have nutrient-rich foods, which will nourish your body and keep you healthy. The food you should eat also will depend on several factors, including:  The calories you need.  The medicines you take.  Your weight.  Your blood glucose level.  Your blood pressure level.  Your cholesterol level. You also should eat a variety of foods, including:  Protein, such as meat, poultry, fish, tofu, nuts, and seeds (lean animal proteins are best).  Fruits.  Vegetables.  Dairy products, such as milk, cheese, and yogurt (low fat is best).  Breads, grains, pasta, cereal, rice, and  beans.  Fats such as olive oil, trans fat-free margarine, canola oil, avocado, and olives. DOES EVERYONE WITH DIABETES MELLITUS HAVE THE SAME MEAL PLAN? Because every person with diabetes mellitus is different, there is not one meal plan that works for everyone. It is very important that you meet with a dietitian who will help you create a meal plan that is just right for you. Document Released: 03/14/2005 Document Revised: 06/22/2013 Document Reviewed: 05/14/2013 [facility] Patient Information 2015 [place], [place]. This information is not intended to replace advice given to you by your health care provider. Make sure you discuss any questions you have with your health care provider.      [employer]

## 2014-10-17 NOTE — Progress Notes (Signed)
Pre visit review using our clinic review tool, if applicable. No additional management support is needed unless otherwise documented below in the visit note. 

## 2014-10-28 ENCOUNTER — Telehealth: Payer: Self-pay | Admitting: Adult Health

## 2014-10-28 NOTE — Telephone Encounter (Signed)
So sorry she is not doing better Just gave 2 weeks of abx  Not sure why she is not improved  She will need to be seen in office for further evaluation .  She has had abx and steroids and is not better.  If unable to wait till Monday to be seen will need to go to ER /urgent care.  Please contact office for sooner follow up if symptoms do not improve or worsen or seek emergency care

## 2014-10-28 NOTE — Telephone Encounter (Signed)
Patient worried that she doesn't have money for co-pay to be seen.  She said that she would have to tough it out and see if she can get better, if not she will call back.  FYI Tammy

## 2014-10-28 NOTE — Telephone Encounter (Signed)
Medication helped with wheezing, but she is struggling right now.  She would like something to get through the weekend.  She is trying to keep from going to the ER.  She said she cannot function.  Patient is wheezing really bad (she thinks it is from the pollen).  Coughing a lot, legs are very achy.

## 2014-10-31 NOTE — Telephone Encounter (Signed)
TP is not in the office today, will return tomorrow 5/3 How is patient doing? She does have a pending appt w/ MW on 5/5 If symptoms have not improved, would send to MW for recommendations since pt stated she is unable to afford copay for visit Thanks

## 2014-10-31 NOTE — Telephone Encounter (Signed)
Spoke with pt. States that she is feeling slightly improved. She is going to keep appointment she has with MW. Durward Fortes her to call us back if she gets worse before 11/03/14. She agreed and verbalized understanding.

## 2014-11-03 ENCOUNTER — Ambulatory Visit (INDEPENDENT_AMBULATORY_CARE_PROVIDER_SITE_OTHER): Payer: 59 | Admitting: Internal Medicine

## 2014-11-03 ENCOUNTER — Encounter: Payer: Self-pay | Admitting: Internal Medicine

## 2014-11-03 VITALS — BP 150/90 | HR 104 | Ht 67.0 in | Wt 212.0 lb

## 2014-11-03 DIAGNOSIS — R05 Cough: Secondary | ICD-10-CM | POA: Diagnosis not present

## 2014-11-03 DIAGNOSIS — J45991 Cough variant asthma: Secondary | ICD-10-CM

## 2014-11-03 DIAGNOSIS — R058 Other specified cough: Secondary | ICD-10-CM

## 2014-11-03 MED ORDER — HYDROCODONE-ACETAMINOPHEN 5-325 MG PO TABS: 1.0000 | ORAL_TABLET | Freq: Four times a day (QID) | ORAL | Status: DC | PRN

## 2014-11-03 MED ORDER — FLUTICASONE PROPIONATE 50 MCG/ACT NA SUSP: 2.0000 | Freq: Every day | NASAL | Status: DC | PRN

## 2014-11-03 MED ORDER — MOMETASONE FURO-FORMOTEROL FUM 100-5 MCG/ACT IN AERO: INHALATION_SPRAY | RESPIRATORY_TRACT | Status: DC

## 2014-11-03 NOTE — Patient Instructions (Addendum)
Start dulera 100 Take 2 puffs first thing in am and then another 2 puffs about 12 hours later.    Work on inhaler technique:  relax and gently blow all the way out then take a nice smooth deep breath back in, triggering the inhaler at same time you start breathing in.  Hold for up to 5 seconds if you can.  Rinse and gargle with water when done    See calendar for specific medication instructions and bring it back for each and every office visit for every healthcare provider you see.  Without it,  you may not receive the best quality medical care that we feel you deserve.  You will note that the calendar groups together  your maintenance  medications that are timed at particular times of the day.  Think of this as your checklist for what your doctor has instructed you to do until your next evaluation to see what benefit  there is  to staying on a consistent group of medications intended to keep you well.  The other group at the bottom is entirely up to you to use as you see fit  for specific symptoms that may arise between visits that require you to treat them on an as needed basis.  Think of this as your action plan or "what if" list.   Separating the top medications from the bottom group is fundamental to providing you adequate care going forward.    Please schedule a follow up office visit in 2 weeks, sooner if needed with med calendar and all active medications and flutter valve in hand  Noted has new dulera 100 sample

## 2014-11-03 NOTE — Progress Notes (Signed)
Subjective:    Patient ID: Gloria [name], female    DOB: 05/04/1951    MRN: [id number]    Brief patient profile:  41 yobf sickly child (? Pna, whooping cough, asthma) never full aerobic tolerance then better ages 63- 57 and no daily rx or need to go doctor and self rx with otcs like nyquil then much  Worse since 10/08/04 when mother died and since then freq prns including  albuterol neb worse since 10/09/11 and needing daily dulera 200 (though admits not consistent with it) referred 08/02/2013 by Dr Basilio Cairo for chronic variable cough worse since Jan 2015    History of Present Illness  08/02/2013 1st [facility] office visit/ Milessa Hogan cc min prod (always white mucus) cough every day esp in am x sev years better on prednisone but "gained too much wt" from baseline 180  - wakes up daily 3 am smothering / hacking > goes to [facility] and uses dulera and helps and props up head and goes back to bed and then wakes up s flare. Also neb maybe once a month. Worse with pollen exp x 2 years  rec Pepcid ac 20 mg one at bedtime until return. GERD diet  Dulera 200 2bid Work on hfa   09/08/2013 acute  ov/Terrin Meddaugh re: asthma/ cough > sob Chief Complaint  Patient presents with  . Acute Visit    Pt c/o increased cough, SOB and wheezing x 4 days. Cough is prod with white sputum. The past few days she has been waking up SOB and having to use albuterol nebs.   no sob p nebs/ still violent cough though no excess mucus rec Depromedrol 120 mg today Nexium  40 mg   Take 30-60 min before first meal of the day and Pepcid 20 mg one bedtime until return to office - this is the best way to tell whether stomach acid is contributing to your problem.   Stop hyzar and use valsartan 160/ 25 one daily  For short of breath > nebulizer every 4 hours For cough > hydrocone cough syrup. Once cough better use dulera Take 2 puffs first thing in am and then another 2 puffs about 12 hours later.  Please schedule a follow up office visit in 4 weeks,  sooner if needed with pfts on return > did not return, request switch docs   03/21/14 [name] ov rec Please stop Aerospan.  Continue to have albuterol available to use if needed for shortness of breath.  We will treat you with levaquin 500mg  daily for 7 day Take tussionex as directed.  Start fexofenadine 180mg  daily Start fluticasone nasal spray, 2 sprays each nostril daily Start nexium 40mg  twice a day for 10 days, then decrease to once a day    03/29/2014 f/u ov/Nhia Heaphy re: pseudoasthma Chief Complaint  Patient presents with  . Acute Visit    Pt c/o increased cough- prod with yellow to brown sputum.  She also wheezing and increased SOB. She is using nebulizer with albuterol at least 5 times per day.   she does report improving p depomedrol but only for a few days then gradually back to baseline cough and sob, not clear she followed any of the instructions given.  Mucus turned clear on levaquin then back to brown w/in a few days of stopping it.  rec Continue to use the albuterol up to every 4 hours as needed but the goal is to minimize need for this  We will treat you with levaquin 500mg   daily for 7 day Take delsym two tsp every 12 hours and supplement if needed with hydrocodone  up to 2 every 4 hours to suppress the urge to cough. Swallowing water or using ice chips/non mint and menthol containing candies (such as lifesavers or sugarless jolly ranchers) are also effective.  You should rest your voice and avoid activities that you know make you cough.Once you have eliminated the cough for 3 straight days try reducing the hydrocodone first,  then the delsym as tolerated.   fexofenadine 180mg  daily Start fluticasone nasal spray, 2 sprays each nostril daily Start nexium 40mg  Take 30- 60 min before your first and last meals of the day  Prednisone 10 mg take  4 each am x 2 days,   2 each am x 2 days,  1 each am x 2 days and stop  Use the flutter valve as much as possible  GERD  Diet     09/12/2014 f/u ov/Juliann Olesky re: sick since January 2015  Chief Complaint  Patient presents with  . Acute Visit    pt has had cough,wheezing and chest tightness for the past month, Pt says mucus is green/ to brownish in color.  says never really cough free x > one year, confused with maint vs prns >>omnicef x 10 d and   10/03/2014 NP  Follow up : Asthma vs psuedoasthma /chronic cough  Pt returns for  2 week follow up and med review .  She is feeling better but cough is not completely better.  Flare of AEAB last ov ,  tx w/ omnicef x 10 d. Changed from hyzaar to avapro /hctz.  Says norco helps and wants refill, discussed the side effects and addiction problems with narcotics.  Advised will give limited refills in hopes to control cough by other means.  B/p is doing okay.  We reviewed all her meds and organized them into a med calendar with pt education  Stopped pepcid . Discussed restarting.  No chest pain, orthopnea, edema or fever.  Workup has shown : NML  PFT w/ no sign BD response . 05/2014  nml cXR 03/2014  Using Dulera As needed  , discussed proair as rescue.     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 Cough    Reports increased wheezing depending on the weather.  wakes up each day around 7 am feeling ok but when walks across the room  Starts wheezing > cough  Started back dulera 200 but not using correctly (see a/p) and down to zero but didn't know it was empty  Says has flutter valve not clear she's using it    No obvious patternsin day to day or daytime variabilty or assoc chronic cough or cp or chest tightness,   overt sinus or hb symptoms. No unusual exp hx or h/o childhood pna/ asthma or knowledge of premature birth.  Sleeping ok without nocturnal  or early am exacerbation  of respiratory  c/o's or need for noct saba. Also denies any obvious fluctuation of symptoms with weather  or environmental changes or other aggravating or alleviating factors except as outlined above   Current Medications, Allergies, Complete Past Medical History, Past Surgical History, Family History, and Social History were reviewed in [employer] record.  ROS  The following are not active complaints unless bolded sore throat, dysphagia, dental problems, itching, sneezing,  nasal congestion or  excess/ purulent secretions, ear ache,   fever, chills, sweats, unintended wt loss, pleuritic or exertional cp, hemoptysis,  orthopnea pnd or leg swelling, presyncope, palpitations, heartburn, abdominal pain, anorexia, nausea, vomiting, diarrhea  or change in bowel or urinary habits, change in stools or urine, dysuria,hematuria,  rash, arthralgias, visual complaints, headache, numbness weakness or ataxia or problems with walking or coordination,  change in mood/affect or memory.                  Objective:   Physical Exam  amb mod hoarse bf nad      Wt Readings from Last 3 Encounters:  11/03/14 212 lb (96.163 kg)  10/17/14 213 lb 1.6 oz (96.662 kg)  10/03/14 214 lb (97.07 kg)   vs 09/08/2013        215  > 03/29/2014  199      Vital signs reviewed    HEENT: nl dentition, turbinates, and orophanx. Nl external ear canals without cough reflex   NECK :  without JVD/Nodes/TM/ nl carotid upstrokes bilaterally   LUNGS: no acc muscle use,  Mild pseudowheezing/ lungs clear   CV:  RRR  no s3 or murmur or increase in P2, no edema   ABD:  soft and nontender with nl excursion in the supine position. No bruits or organomegaly, bowel sounds nl  MS:  warm without deformities, calf tenderness, cyanosis or clubbing  SKIN: warm and dry without lesions    NEURO:  alert, approp, no deficits              Assessment & Plan:

## 2014-11-03 NOTE — Assessment & Plan Note (Addendum)
Trial of flutter valve 03/29/2014 >>> 10/03/2014 med calendar > not using 11/03/2014  -10/03/14  Allergy profile >  IgE 80 but neg RAST -CT sinus 10/11/2014 >Majority of visualized paranasal sinuses are clear with the exception of partial opacification of the left ethmoid sinus air cell. >>  Ceftin x 14 d >no better   I had an extended discussion with the patient reviewing all relevant studies completed to date and  lasting 15 to 20 minutes of a 25 minute visit on the following ongoing concerns:  1)  Clearly not using meds correctly and not clear how much of this is cough variant asthma vs uacs   2) The standardized cough guidelines published in Chest by Lissa Morales in 2006 are still the best available and consist of a multiple step process (up to 12!) , not a single office visit,  and are intended  to address this problem logically,  with an alogrithm dependent on response to empiric treatment at  each progressive step  to determine a specific diagnosis with  minimal addtional testing needed. Therefore if adherence is an issue or can't be accurately verified,  it's very unlikely the standard evaluation and treatment will be successful here.    Furthermore, response to therapy (other than acute cough suppression, which should only be used short term with avoidance of narcotic containing cough syrups if possible), can be a gradual process for which the patient may perceive immediate benefit.  Unlike going to an eye doctor where the best perscription is almost always the first one and is immediately effective, this is almost never the case in the management of chronic cough syndromes. Therefore the patient needs to commit up front to consistently adhere to recommendations  for up to 6 weeks of therapy directed at the likely underlying problem(s) before the response can be reasonably evaluated.    3) Each maintenance medication was reviewed in detail including most importantly the difference between  maintenance and as needed and under what circumstances the prns are to be used. This was done in the context of a medication calendar review which provided the patient with a user-friendly unambiguous mechanism for medication administration and reconciliation and provides an action plan for all active problems. It is critical that this be shown to every doctor  for modification during the office visit if necessary so the patient can use it as a working document.      4) next step is trust but verify:   To keep things simple, I have asked the patient to first separate medicines that are perceived as maintenance, that is to be taken daily "no matter what", from those medicines that are taken on only on an as-needed basis and I have given the patient examples of both, and then return to see our NP  To validate / update the med calendar we gave her today

## 2014-11-06 ENCOUNTER — Encounter: Payer: Self-pay | Admitting: Internal Medicine

## 2014-11-06 DIAGNOSIS — J45991 Cough variant asthma: Secondary | ICD-10-CM | POA: Insufficient documentation

## 2014-11-06 NOTE — Assessment & Plan Note (Addendum)
The proper method of use, as well as anticipated side effects, of a metered-dose inhaler are discussed and demonstrated to the patient. Improved effectiveness after extensive coaching during this visit to a level of approximately  75% so reduce   dulera to  100 2bid as not likely to contribute to coughing as the 200 which she wasn't using correctly anyway   Goal is at least to cut down on saba use and or try to get a spirometry on a day she hasn't used any for baseline purposes - ultimately methacholine challenge will need to be done to sort out

## 2014-11-21 ENCOUNTER — Telehealth: Payer: Self-pay | Admitting: Internal Medicine

## 2014-11-21 NOTE — Telephone Encounter (Signed)
Called and spoke to pt. Pt stated she had to cancel appt with MW d/t financial issues and not being able to afford her co-pay at this time. Pt wanted to let MW know she is doing well and is following his instructions and will call back to reschedule an appt hopefully within 2 weeks.   Will send to MW as Juluis Rainier.

## 2014-11-22 ENCOUNTER — Ambulatory Visit: Payer: 59 | Admitting: Internal Medicine

## 2014-12-07 ENCOUNTER — Telehealth: Payer: Self-pay | Admitting: Adult Health

## 2014-12-07 NOTE — Telephone Encounter (Signed)
I am sorry, but Xanax is not one of the medications that I write prescriptions for. I would be happy to write for Atarax that she can take at night as needed to help with her anxiety until she can be seen.   She can try and establish with these psychiatrists.   Sheralyn Boatman, MD [phone], on [address]) and Norma Fredrickson, MD [phone] on [address]   What dietician did she see. Unfortunately, she may have to self pay for this service if her insurance will not cover it. Diet and exercise are the big factors in helping to keep her diabetes under control. She can search for diabetes educators in [place] or if she wants to self pay I can refer her to [employer].

## 2014-12-07 NOTE — Telephone Encounter (Signed)
1.Pt states the dietician you referred he to will not take her insurance.  Pt concerned b/c she doesn't want her DM to get out of control. 2. Pt states her psychiatrist has left the practice and is no longer seeing pts. Pt cannot get an appt W/ another dr until October. So no one to write her rx for ALPRAZolam Duanne Moron) 1 MG tablet.  They advise pt to call her pcp. Pt would like to know if cory will write for her unti her appt in october

## 2014-12-07 NOTE — Telephone Encounter (Signed)
Pls advise.  

## 2014-12-08 NOTE — Telephone Encounter (Signed)
Side effects include, dry mouth and sleepiness.   As with any drug there is the possibility of allergic reaction: rash, hives, itching, trouble breathing, swelling of the mouth - these are rare - A fast heartbeat - Feeling confused

## 2014-12-08 NOTE — Telephone Encounter (Signed)
Pt returned call and advised pt of Cory's recommendations.  Pt states she is only taking the alprazolam tid prn and she does not go over the recommended dose. Advised pt that prescribing the alprazolam is not an option. Pt given the information for the 2 other psychiatrists [facility] recommended.  Pt states she will think about the Atarax at night time. Pt is currently going thorough Hospice with her Aunt and her alimonyhas been cut off.  Advised pt that it is important to follow up with psychiatrist.  When it comes to dietician pt states financially she cannot afford one at this time.  Pt is aware that diet and exercise are important.  I will make pt a diabetes kit with information and sent it to her verified address.    Please advise on the side effects of Atarax.  Pt will decide if she would like to try this medication.

## 2014-12-09 NOTE — Telephone Encounter (Signed)
Called and spoke with pt and pt is aware of side effects.  Pt states she is working with her psychiatrist office on the medication.  Advised pt that I had sent information concerning diabetes. Pt verbalized understanding.

## 2014-12-20 ENCOUNTER — Telehealth: Payer: Self-pay | Admitting: Adult Health

## 2014-12-20 ENCOUNTER — Emergency Department (HOSPITAL_COMMUNITY)
Admission: EM | Admit: 2014-12-20 | Discharge: 2014-12-20 | Disposition: A | Discharge status: Home / Self Care | Payer: 59 | Attending: Emergency Medicine | Admitting: Emergency Medicine

## 2014-12-20 ENCOUNTER — Encounter (HOSPITAL_COMMUNITY): Payer: Self-pay | Admitting: Nurse Practitioner

## 2014-12-20 DIAGNOSIS — Z8639 Personal history of other endocrine, nutritional and metabolic disease: Secondary | ICD-10-CM | POA: Diagnosis not present

## 2014-12-20 DIAGNOSIS — Z8742 Personal history of other diseases of the female genital tract: Secondary | ICD-10-CM | POA: Diagnosis not present

## 2014-12-20 DIAGNOSIS — Z7951 Long term (current) use of inhaled steroids: Secondary | ICD-10-CM | POA: Diagnosis not present

## 2014-12-20 DIAGNOSIS — Z79899 Other long term (current) drug therapy: Secondary | ICD-10-CM | POA: Insufficient documentation

## 2014-12-20 DIAGNOSIS — G43909 Migraine, unspecified, not intractable, without status migrainosus: Secondary | ICD-10-CM | POA: Diagnosis present

## 2014-12-20 DIAGNOSIS — Z8709 Personal history of other diseases of the respiratory system: Secondary | ICD-10-CM | POA: Insufficient documentation

## 2014-12-20 DIAGNOSIS — F410 Panic disorder [episodic paroxysmal anxiety] without agoraphobia: Secondary | ICD-10-CM | POA: Diagnosis not present

## 2014-12-20 DIAGNOSIS — F329 Major depressive disorder, single episode, unspecified: Secondary | ICD-10-CM | POA: Insufficient documentation

## 2014-12-20 DIAGNOSIS — I10 Essential (primary) hypertension: Secondary | ICD-10-CM | POA: Insufficient documentation

## 2014-12-20 DIAGNOSIS — M1990 Unspecified osteoarthritis, unspecified site: Secondary | ICD-10-CM | POA: Insufficient documentation

## 2014-12-20 DIAGNOSIS — G43019 Migraine without aura, intractable, without status migrainosus: Secondary | ICD-10-CM | POA: Diagnosis not present

## 2014-12-20 MED ORDER — SODIUM CHLORIDE 0.9 % IV BOLUS (SEPSIS)
500.0000 mL | Freq: Once | INTRAVENOUS | Status: AC
  Administered 2014-12-20: 500 mL via INTRAVENOUS

## 2014-12-20 MED ORDER — DEXAMETHASONE SODIUM PHOSPHATE 10 MG/ML IJ SOLN
10.0000 mg | Freq: Once | INTRAMUSCULAR | Status: AC
  Administered 2014-12-20: 10 mg via INTRAVENOUS
  Filled 2014-12-20: qty 1

## 2014-12-20 MED ORDER — METOCLOPRAMIDE HCL 5 MG/ML IJ SOLN
10.0000 mg | Freq: Once | INTRAMUSCULAR | Status: AC
  Administered 2014-12-20: 10 mg via INTRAVENOUS
  Filled 2014-12-20: qty 2

## 2014-12-20 MED ORDER — KETOROLAC TROMETHAMINE 15 MG/ML IJ SOLN: 15.0000 mg | Freq: Once | INTRAMUSCULAR | Status: DC

## 2014-12-20 MED ORDER — ACETAMINOPHEN 500 MG PO TABS
1000.0000 mg | ORAL_TABLET | Freq: Once | ORAL | Status: AC
  Administered 2014-12-20: 1000 mg via ORAL
  Filled 2014-12-20: qty 2

## 2014-12-20 NOTE — Telephone Encounter (Signed)
Attempted to call pt but pt is currently admitted in the ED.

## 2014-12-20 NOTE — Discharge Instructions (Signed)

## 2014-12-20 NOTE — ED Provider Notes (Signed)
Patient seen/examined in the Emergency Department in conjunction with Resident Physician Provider  Patient reports HA - similar to prior episodes of HA, no focal weakness reported Exam : awake/alert, no facial droop, no arm drift Plan: will treat HA and reassess.  No signs of acute neurologic emergency at this time    Ripley Fraise, MD 12/20/14 [phone]

## 2014-12-20 NOTE — Telephone Encounter (Signed)
Pt returned call and states she is currently in the ED.  Advised pt of Cory's recommendations. Pt verbalized understanding and states that she has already tried her migraine medications and a headache clinic before and it did not help.  Advised pt that maybe neurology would be an option for her.  Pt states she will consider that and call the office if she decides to go forward.

## 2014-12-20 NOTE — ED Provider Notes (Signed)
CSN: [id number]     Arrival date & time 12/20/14  1347 History   First MD Initiated Contact with Patient 12/20/14 1523     Chief Complaint  Patient presents with  . Migraine     (Consider location/radiation/quality/duration/timing/severity/associated sxs/prior Treatment) Patient is a 64 y.o. female presenting with migraines.  Migraine This is a recurrent problem. Episode onset: 4 days ago. The problem occurs constantly. The problem has been gradually worsening. Associated symptoms include headaches. Pertinent negatives include no abdominal pain, anorexia, chest pain, chills, congestion, coughing, diaphoresis, fatigue, fever, nausea, neck pain, numbness, rash, sore throat, vertigo, visual change, vomiting or weakness. Exacerbated by: phono/photophobia. Treatments tried: exedrine, tylenol, imitrex. The treatment provided mild relief.    Past Medical History  Diagnosis Date  . Depressive disorder, not elsewhere classified   . Other and unspecified hyperlipidemia   . Unspecified essential hypertension   . Migraine, unspecified, without mention of intractable migraine without mention of status migrainosus   . Unspecified urinary incontinence   . Arthritis   . Chronic bronchitis   . Anxiety state, unspecified   . Postmenopausal symptoms   . Panic attacks   . Allergic rhinitis, cause unspecified 03/30/2013  . Borderline diabetes mellitus     a1c 7.0 (07/26/13)    Past Surgical History  Procedure Laterality Date  . Abdominal hysterectomy  2002  . Dislocated shoulder      right  . Diagnostic laparoscopy    . Tumor right ankle     Family History  Problem Relation Age of Onset  . Arthritis      family history  . Hypertension      grandparent  . Colon cancer Maternal Grandfather   . Asthma Son    History  Substance Use Topics  . Smoking status: Never Smoker   . Smokeless tobacco: Never Used  . Alcohol Use: No   OB History    Gravida Para Term Preterm AB TAB SAB Ectopic  Multiple Living   1 1 1       1      Review of Systems  Constitutional: Negative for fever, chills, diaphoresis, appetite change and fatigue.  HENT: Negative for congestion, ear pain, facial swelling, mouth sores and sore throat.   Eyes: Negative for visual disturbance.  Respiratory: Negative for cough, chest tightness and shortness of breath.   Cardiovascular: Negative for chest pain and palpitations.  Gastrointestinal: Negative for nausea, vomiting, abdominal pain, diarrhea, blood in stool and anorexia.  Endocrine: Negative for cold intolerance and heat intolerance.  Genitourinary: Negative for frequency, decreased urine volume and difficulty urinating.  Musculoskeletal: Negative for back pain, neck pain and neck stiffness.  Skin: Negative for rash.  Neurological: Positive for headaches. Negative for dizziness, vertigo, weakness, light-headedness and numbness.  All other systems reviewed and are negative.     Allergies  Compazine; Haloperidol decanoate; Penicillins; Codeine; and Lisinopril  Home Medications   Prior to Admission medications   Medication Sig Start Date End Date Taking? Authorizing Provider  albuterol (PROVENTIL HFA;VENTOLIN HFA) 108 (90 BASE) MCG/ACT inhaler Inhale 2 puffs into the lungs every 4 (four) hours as needed for wheezing or shortness of breath (((PLAN B))).    Historical Provider, MD  albuterol (PROVENTIL) (2.5 MG/3ML) 0.083% nebulizer solution Inhale 3 mLs (2.5 mg total) into the lungs every 4 (four) hours as needed for wheezing or shortness of breath (((PLAN C))). 10/17/14   Dorothyann Peng, NP  ALPRAZolam Duanne Moron) 1 MG tablet Take 1 mg by mouth daily as  needed for anxiety.    Rosamaria Lints, MD  cefUROXime (CEFTIN) 500 MG tablet Take 1 tablet (500 mg total) by mouth 2 (two) times daily with a meal. Take with meals and probiotic. 10/12/14   Tammy S Parrett, NP  Cholecalciferol (VITAMIN D-3) 5000 UNITS TABS Take 1 tablet by mouth daily.    Historical  Provider, MD  [facility] Lancets MISC USE TO CHECK BLOOD GLUCOSE ONCE DAILY FASTING 02/01/14   Kennyth Arnold, FNP  Cyanocobalamin (VITAMIN B 12 PO) Take 1 capsule by mouth daily.      Historical Provider, MD  dextromethorphan-guaiFENesin (MUCINEX DM) 30-600 MG per 12 hr tablet Take 1 tablet by mouth 2 (two) times daily as needed (w/ flutter valve).    Historical Provider, MD  famotidine (PEPCID) 20 MG tablet Take 20 mg by mouth at bedtime.    Historical Provider, MD  fexofenadine (ALLEGRA) 180 MG tablet Take 1 tablet (180 mg total) by mouth daily. 03/21/14   Collene Gobble, MD  fluticasone (FLONASE) 50 MCG/ACT nasal spray Place 2 sprays into both nostrils daily as needed for allergies or rhinitis. 11/03/14   Tanda Rockers, MD  glucose blood (ACCU-CHEK AVIVA) test strip Use as instructed 01/21/14   Kennyth Arnold, FNP  Nyoka Cowden Tea 200 MG CAPS Take 1 capsule by mouth daily.    Historical Provider, MD  hydrochlorothiazide (HYDRODIURIL) 50 MG tablet TAKE 1 TABLET BY MOUTH EVERY DAY 05/23/14   Tanda Rockers, MD  HYDROcodone-acetaminophen (NORCO) 5-325 MG per tablet Take 1 tablet by mouth every 6 (six) hours as needed for moderate pain. 11/03/14   Tanda Rockers, MD  HYDROcodone-acetaminophen (NORCO/VICODIN) 5-325 MG per tablet Take 1-2 tablets by mouth every 4 (four) hours as needed. 10/03/14   Tammy S Parrett, NP  irbesartan (AVAPRO) 150 MG tablet Take 1 tablet (150 mg total) by mouth daily. 09/12/14   Tanda Rockers, MD  metFORMIN (GLUCOPHAGE) 500 MG tablet Take 1 tablet (500 mg total) by mouth 2 (two) times daily with a meal. 10/17/14   Dorothyann Peng, NP  mometasone-formoterol (DULERA) 100-5 MCG/ACT AERO Inhale 2 puffs into the lungs every morning. 10/17/14   Dorothyann Peng, NP  Multiple Vitamin (MULTIVITAMIN) tablet Take 1 tablet by mouth daily.    Historical Provider, MD  omeprazole (PRILOSEC) 40 MG capsule Take 1 capsule (40 mg total) by mouth daily. 04/26/14   Collene Gobble, MD  ondansetron (ZOFRAN ODT) 4 MG  disintegrating tablet Take 1 tablet (4 mg total) by mouth every 8 (eight) hours as needed for nausea or vomiting. 05/16/14   Lucretia Kern, DO  phenazopyridine (PYRIDIUM) 95 MG tablet Take 95 mg by mouth daily.    Historical Provider, MD  Pyridoxine HCl (VITAMIN B-6) 250 MG tablet Take 250 mg by mouth daily.    Historical Provider, MD  Respiratory Therapy Supplies (FLUTTER) DEVI Use as directed 03/30/14   Tanda Rockers, MD  rizatriptan (MAXALT) 10 MG tablet Take 1 tablet (10 mg total) by mouth as needed for migraine. May repeat in 2 hours if needed 10/17/14   Dorothyann Peng, NP  SUMAtriptan (IMITREX) 25 MG tablet 1 at the first sign of migraine, can take one more 2 hours later. No more then 2 in 24 hours. 05/16/14   Lucretia Kern, DO  zaleplon (SONATA) 10 MG capsule Take 1 capsule by mouth at bedtime as needed. 09/22/14   Historical Provider, MD   BP 149/78 mmHg  Pulse 86  Temp(Src)  97.9 F (36.6 C) (Oral)  Resp 18  Ht 5\' 7"  (1.702 m)  Wt 214 lb 12.8 oz (97.433 kg)  BMI 33.63 kg/m2  SpO2 96% Physical Exam  Constitutional: She is oriented to person, place, and time. She appears well-developed and well-nourished. No distress.  HENT:  Head: Normocephalic and atraumatic.  Right Ear: External ear normal.  Left Ear: External ear normal.  Nose: Nose normal.  Eyes: Conjunctivae and EOM are normal. Pupils are equal, round, and reactive to light. Right eye exhibits no discharge. Left eye exhibits no discharge. No scleral icterus.  Neck: Normal range of motion. Neck supple.  Cardiovascular: Normal rate, regular rhythm and normal heart sounds.  Exam reveals no gallop and no friction rub.   No murmur heard. Pulmonary/Chest: Effort normal and breath sounds normal. No stridor. No respiratory distress. She has no wheezes.  Abdominal: Soft. She exhibits no distension. There is no tenderness.  Musculoskeletal: She exhibits no edema or tenderness.  Neurological: She is alert and oriented to person, place, and  time. She has normal strength. No cranial nerve deficit or sensory deficit. Coordination normal. GCS eye subscore is 4. GCS verbal subscore is 5. GCS motor subscore is 6.  Reflex Scores:      Bicep reflexes are 1+ on the right side and 1+ on the left side.      Brachioradialis reflexes are 1+ on the right side and 1+ on the left side.      Patellar reflexes are 1+ on the right side and 1+ on the left side. No dysarthria, Nystagmus, Tremors.   Skin: Skin is warm and dry. No rash noted. She is not diaphoretic. No erythema.  Psychiatric: She has a normal mood and affect.    ED Course  Procedures (including critical care time) Labs Review Labs Reviewed - No data to display  Imaging Review No results found.   EKG Interpretation None      MDM   [place] 63-year-old female with a history of migraine headaches A here for right-sided headache similar to her prior migraines. Rest of the history and exam as above. Patient denied any visual changes to suggest glaucoma or temporal arteritis. No recent fevers to suggest meningitis. The headache began 4 days ago gradually worsened, doubt subarachnoid hemorrhage. Patient was provided with IV fluids, Toradol, Tylenol, Decadron, Reglan which had minimal improvement of her pain. Patient requests and Dilaudid for her headache stating that the symptoms anything that made her pain go away in the last time she was here. She was informed that we're unable to provide her with Dilaudid for headache. Patient became frustrated and requesting to leave. Patient is in good condition with no focal deficits unable to ambulate. We feel she is safe for discharge with strict return precautions. To follow-up with her PCP for further management.  Patient seen in conjunction with Dr. Christy Gentles.  Final diagnoses:  Intractable migraine without aura and without status migrainosus        Addison Lank, MD 12/21/14 6811  Ripley Fraise, MD 12/21/14 [phone]

## 2014-12-20 NOTE — ED Notes (Signed)
Pt ambulated in hallway with out any complaints

## 2014-12-20 NOTE — Telephone Encounter (Signed)
Patient states she having a migraine headache with vomiting.  She didn't want to schedule an appointment with Tommi Rumps because she said he will not give her the Demerol injection which helps the best.   She wanted to see another provider today that will give the injection but I advised her I couldn't do that since Tommi Rumps is her PCP.  Patient would like to know if Tommi Rumps will give her the injection before she schedules an appointment?

## 2014-12-20 NOTE — Telephone Encounter (Signed)
I am sorry that she is feeling so badly. I do not feel comfortable giving Demerol injections. I would be happy to refill her other migraine medications and refer her to the headache clinic though.

## 2014-12-20 NOTE — ED Notes (Signed)
She c/o migraine x 3 days. She has tried her migraine and nausea medications at home with no relief. She is A&Ox4, resp e/u

## 2014-12-27 ENCOUNTER — Telehealth: Payer: Self-pay | Admitting: Internal Medicine

## 2014-12-27 ENCOUNTER — Other Ambulatory Visit: Payer: Self-pay | Admitting: Internal Medicine

## 2014-12-27 NOTE — Telephone Encounter (Signed)
Missed appt so no can do, she'll need to make and keep appt with all meds in hand to regroup or get her primary to fill the rx

## 2014-12-27 NOTE — Telephone Encounter (Signed)
Pt is requesting refill on norco. Last OV/refill 11/03/14 #40 x 0 refills Pt cancelled appt 11/22/14 after was told to f/u in 2 weeks. No pending appt. Please advise MW thanks

## 2014-12-28 NOTE — Telephone Encounter (Signed)
Called made pt aware. appt scheduled for tomorrow at 4:15. Aware to bring all meds. Nothing further needed

## 2014-12-29 ENCOUNTER — Other Ambulatory Visit (INDEPENDENT_AMBULATORY_CARE_PROVIDER_SITE_OTHER): Payer: 59

## 2014-12-29 ENCOUNTER — Ambulatory Visit (INDEPENDENT_AMBULATORY_CARE_PROVIDER_SITE_OTHER): Payer: 59 | Admitting: Internal Medicine

## 2014-12-29 ENCOUNTER — Encounter: Payer: Self-pay | Admitting: Internal Medicine

## 2014-12-29 VITALS — BP 166/96 | HR 96 | Ht 67.0 in | Wt 209.0 lb

## 2014-12-29 DIAGNOSIS — J45991 Cough variant asthma: Secondary | ICD-10-CM

## 2014-12-29 DIAGNOSIS — I10 Essential (primary) hypertension: Secondary | ICD-10-CM

## 2014-12-29 DIAGNOSIS — R05 Cough: Secondary | ICD-10-CM

## 2014-12-29 DIAGNOSIS — R058 Other specified cough: Secondary | ICD-10-CM

## 2014-12-29 MED ORDER — HYDROCODONE-ACETAMINOPHEN 5-325 MG PO TABS: 1.0000 | ORAL_TABLET | ORAL | Status: DC | PRN

## 2014-12-29 MED ORDER — HYDROCHLOROTHIAZIDE 50 MG PO TABS: 50.0000 mg | ORAL_TABLET | Freq: Every day | ORAL | Status: DC

## 2014-12-29 MED ORDER — IRBESARTAN 150 MG PO TABS: 150.0000 mg | ORAL_TABLET | Freq: Every day | ORAL | Status: DC

## 2014-12-29 MED ORDER — BUDESONIDE-FORMOTEROL FUMARATE 80-4.5 MCG/ACT IN AERO: INHALATION_SPRAY | RESPIRATORY_TRACT | Status: DC

## 2014-12-29 NOTE — Patient Instructions (Addendum)
Start symbicort 80 Take 2 puffs first thing in am and then another 2 puffs about 12 hours later.   See calendar for specific medication instructions and bring it back for each and every office visit for every healthcare provider you see.  Without it,  you may not receive the best quality medical care that we feel you deserve.  You will note that the calendar groups together  your maintenance  medications that are timed at particular times of the day.  Think of this as your checklist for what your doctor has instructed you to do until your next evaluation to see what benefit  there is  to staying on a consistent group of medications intended to keep you well.  The other group at the bottom is entirely up to you to use as you see fit  for specific symptoms that may arise between visits that require you to treat them on an as needed basis.  Think of this as your action plan or "what if" list.   Separating the top medications from the bottom group is fundamental to providing you adequate care going forward.    Please schedule a follow up office visit in 4 weeks, sooner if needed to see Tammy with all meds in hand

## 2014-12-29 NOTE — Progress Notes (Signed)
Subjective:    Patient ID: Gloria [name], female    DOB: October 27, 1950    MRN: [id number]    Brief patient profile:  12 yobf never smoker sickly child (? Pna, whooping cough, asthma) never full aerobic tolerance then better ages 48- 73 and no daily rx or need to go doctor and self rx with otcs like nyquil then much  Worse since October 02, 2004 when mother died and since then freq prns including  albuterol neb worse since October 03, 2011 and needing daily dulera 200 (though admits not consistent with it) referred 08/02/2013 by Dr Gloria [name] for chronic variable cough worse since Jan 2015    History of Present Illness  08/02/2013 1st [facility] office visit/ Gloria [name] cc min prod (always white mucus) cough every day esp in am x sev years better on prednisone but "gained too much wt" from baseline 180  - wakes up daily 3 am smothering / hacking > goes to [facility] and uses dulera and helps and props up head and goes back to bed and then wakes up s flare. Also neb maybe once a month. Worse with pollen exp x 2 years  rec Pepcid ac 20 mg one at bedtime until return. GERD diet  Dulera 200 2bid Work on hfa   09/08/2013 acute  ov/Gloria [name] re: asthma/ cough > sob Chief Complaint  Patient presents with  . Acute Visit    Pt c/o increased cough, SOB and wheezing x 4 days. Cough is prod with white sputum. The past few days she has been waking up SOB and having to use albuterol nebs.   no sob p nebs/ still violent cough though no excess mucus rec Depromedrol 120 mg today Nexium  40 mg   Take 30-60 min before first meal of the day and Pepcid 20 mg one bedtime until return to office - this is the best way to tell whether stomach acid is contributing to your problem.   Stop hyzar and use valsartan 160/ 25 one daily  For short of breath > nebulizer every 4 hours For cough > hydrocone cough syrup. Once cough better use dulera Take 2 puffs first thing in am and then another 2 puffs about 12 hours later.  Please schedule a follow up office visit in  4 weeks, sooner if needed with pfts on return > did not return, request switch docs    03/29/2014 f/u ov/Gloria [name] re: pseudoasthma Chief Complaint  Patient presents with  . Acute Visit    Pt c/o increased cough- prod with yellow to brown sputum.  She also wheezing and increased SOB. She is using nebulizer with albuterol at least 5 times per day.   she does report improving p depomedrol but only for a few days then gradually back to baseline cough and sob, not clear she followed any of the instructions given.  Mucus turned clear on levaquin then back to brown w/in a few days of stopping it.  rec Continue to use the albuterol up to every 4 hours as needed but the goal is to minimize need for this  We will treat you with levaquin 500mg  daily for 7 day Take delsym two tsp every 12 hours and supplement if needed with hydrocodone  up to 2 every 4 hours to suppress the urge to cough. Swallowing water or using ice chips/non mint and menthol containing candies (such as lifesavers or sugarless jolly ranchers) are also effective.  You should rest your voice and avoid activities that you know make you cough.Once  you have eliminated the cough for 3 straight days try reducing the hydrocodone first,  then the delsym as tolerated.   fexofenadine 180mg  daily Start fluticasone nasal spray, 2 sprays each nostril daily Start nexium 40mg  Take 30- 60 min before your first and last meals of the day  Prednisone 10 mg take  4 each am x 2 days,   2 each am x 2 days,  1 each am x 2 days and stop  Use the flutter valve as much as possible  GERD  Diet    09/12/2014 f/u ov/Gloria [name] re: sick since January 2015  Chief Complaint  Patient presents with  . Acute Visit    pt has had cough,wheezing and chest tightness for the past month, Pt says mucus is green/ to brownish in color.  says never really cough free x > one year, confused with maint vs prns >>omnicef x 10 d and   10/03/2014 NP  Follow up : Asthma vs psuedoasthma /chronic  cough  Pt returns for  2 week follow up and med review .  She is feeling better but cough is not completely better.  Flare of AEAB last ov ,  tx w/ omnicef x 10 d. Changed from hyzaar to avapro /hctz.  Says norco helps and wants refill, discussed the side effects and addiction problems with narcotics.  Advised will give limited refills in hopes to control cough by other means.  B/p is doing okay.  We reviewed all her meds and organized them into a med calendar with pt education  Stopped pepcid . Discussed restarting.  No chest pain, orthopnea, edema or fever.  Workup has shown : NML  PFT w/ no sign BD response . 05/2014  nml cXR 03/2014  Using Dulera As needed  , discussed proair as rescue.  rec Follow med calendar closely and bring to each visit .  We are setting you up for labs and CT sinus> neg  Stop use Dulera .  ProAir and Albuterol are your rescue inhalers to be used if needed.  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 Cough    Reports increased wheezing depending on the weather.  wakes up each day around 7 am feeling ok but when walks across the room  Starts wheezing > cough  Started back dulera 200 but not using correctly (see a/p) and down to zero but didn't know it was empty  Says has flutter valve not clear she's using it  rec dulera 100 2bid  Follow med calendar    12/29/2014 f/u ov/Gloria [name] re: recurrent cough / no med calendar/ no meds  Chief Complaint  Patient presents with  . Follow-up    Pt states that her cough and SOB are unchanged. She is only using Dulera prn "not that often".   not following any of the instructions we gave her and very negative overall about her care except for stating always gets better p steroid rx    No obvious patterns in day to day or daytime variabilty or assoc excess or purulent sputum or  cp or chest  tightness,   overt sinus or hb symptoms. No unusual exp hx or h/o childhood pna/ asthma or knowledge of premature birth.  Sleeping ok without nocturnal  or early am exacerbation  of respiratory  c/o's or need for noct saba.  Also denies any obvious fluctuation of symptoms with weather or environmental changes or other aggravating or alleviating factors except as outlined above   Current Medications, Allergies, Complete Past Medical History, Past Surgical History, Family History, and Social History were reviewed in [employer] record.  ROS  The following are not active complaints unless bolded sore throat, dysphagia, dental problems, itching, sneezing,  nasal congestion or excess/ purulent secretions, ear ache,   fever, chills, sweats, unintended wt loss, pleuritic or exertional cp, hemoptysis,  orthopnea pnd or leg swelling, presyncope, palpitations, heartburn, abdominal pain, anorexia, nausea, vomiting, diarrhea  or change in bowel or urinary habits, change in stools or urine, dysuria,hematuria,  rash, arthralgias, visual complaints, headache, numbness weakness or ataxia or problems with walking or coordination,  change in mood/affect or memory.                  Objective:   Physical Exam  amb mod hoarse obese bf nad     12/29/14           209  Wt Readings from Last 3 Encounters:  11/03/14 212 lb (96.163 kg)  10/17/14 213 lb 1.6 oz (96.662 kg)  10/03/14 214 lb (97.07 kg)   vs 09/08/2013        215  > 03/29/2014  199      Vital signs reviewed    HEENT: nl dentition, turbinates, and orophanx. Nl external ear canals without cough reflex   NECK :  without JVD/Nodes/TM/ nl carotid upstrokes bilaterally   LUNGS: no acc muscle use,  Mild pseudowheezing/ lungs clear   CV:  RRR  no s3 or murmur or increase in P2, no edema   ABD:  soft and nontender with nl excursion in the supine position. No bruits or organomegaly, bowel sounds nl  MS:  warm without  deformities, calf tenderness, cyanosis or clubbing  SKIN: warm and dry without lesions    NEURO:  alert, approp, no deficits              Assessment & Plan:

## 2014-12-30 LAB — BASIC METABOLIC PANEL
BUN: 13 mg/dL (ref 6–23)
CO2: 29 mEq/L (ref 19–32)
Calcium: 9.8 mg/dL (ref 8.4–10.5)
Chloride: 97 mEq/L (ref 96–112)
Creatinine, Ser: 0.87 mg/dL (ref 0.40–1.20)
GFR: 84.16 mL/min (ref >60.00)
Glucose, Bld: 126 mg/dL — ABNORMAL HIGH (ref 70–99)
Potassium: 3.8 mEq/L (ref 3.5–5.1)
Sodium: 136 mEq/L (ref 135–145)

## 2015-01-01 ENCOUNTER — Encounter: Payer: Self-pay | Admitting: Internal Medicine

## 2015-01-01 NOTE — Assessment & Plan Note (Signed)
Changed cozar to valsartan 09/09/2013 ? Improved - retrial off losartan due to persistent cough   09/12/14   Not Adequate control on present rx, reviewed > no change in rx needed  For now but will need to avoid stress/ na/ be sure she takes her meds on time an f/u primary care

## 2015-01-01 NOTE — Assessment & Plan Note (Signed)
-   12/29/14  p extensive coaching HFA effectiveness =    75% >   Since reports improvement from steroids systemically there may be an element of airways inflammation/ cough variant asthma here >> rechallenge with symibcort 80 2bid since this is less likely to irritate her upper airway cough syndrome and is presently free under [facility]

## 2015-01-01 NOTE — Assessment & Plan Note (Signed)
Body mass index is 32.73 kg/(m^2).  Lab Results  Component Value Date   TSH 2.79 03/29/2014     Contributing to gerd tendency/hbp/aodm/ doe/ needs to achieve and maintain neg calorie balance > f/u primary care

## 2015-01-01 NOTE — Assessment & Plan Note (Signed)
Trial of flutter valve 03/29/2014 >>> 10/03/2014 med calendar > not using 11/03/2014  -10/03/14  Allergy profile >  IgE 80 but neg RAST -CT sinus 10/11/2014 >Majority of visualized paranasal sinuses are clear with the exception of partial opacification of the left ethmoid sinus air cell. >>Ceftin x 14 d >no better   I had an extended discussion with the patient reviewing all relevant studies completed to date and  lasting 15 to 20 minutes of a 25 minute visit    1) The standardized cough guidelines published in Chest by Lissa Morales in 2006 are still the best available and consist of a multiple step process (up to 12!) , not a single office visit,  and are intended  to address this problem logically,  with an alogrithm dependent on response to empiric treatment at  each progressive step  to determine a specific diagnosis with  minimal addtional testing needed. Therefore if adherence is an issue or can't be accurately verified,  it's very unlikely the standard evaluation and treatment will be successful here.    Furthermore, response to therapy (other than acute cough suppression, which should only be used short term with avoidance of narcotic containing cough syrups if possible), can be a gradual process for which the patient may perceive immediate benefit.  Unlike going to an eye doctor where the best perscription is almost always the first one and is immediately effective, this is almost never the case in the management of chronic cough syndromes. Therefore the patient needs to commit up front to consistently adhere to recommendations  for up to 6 weeks of therapy directed at the likely underlying problem(s) before the response can be reasonably evaluated.   2) Each maintenance medication was reviewed in detail including most importantly the difference between maintenance and prns and under what circumstances the prns are to be triggered using an action plan format that is not reflected in the computer  generated alphabetically organized AVS.    3) next step is trust but verify with all meds in hand before go any further in the w/u   Please see instructions for details which were reviewed in writing and the patient given a copy highlighting the part that I personally wrote and discussed at today's ov.

## 2015-01-04 NOTE — Progress Notes (Signed)
Quick Note:  Spoke with pt and notified of results per Dr. Wert. Pt verbalized understanding and denied any questions.  ______ 

## 2015-01-23 ENCOUNTER — Telehealth: Payer: Self-pay | Admitting: Family

## 2015-01-23 NOTE — Telephone Encounter (Signed)
Patient Name: Gloria [name] DOB: Sep 05, 1950 Initial Comment caller states her BS is elevated, she was put on Metformin and its giving her diarrhea Nurse Assessment Nurse: Marcelline Deist, RN, Kermit Balo Date/Time (Eastern Time): 01/23/2015 10:14:45 AM Confirm and document reason for call. If symptomatic, describe symptoms. ---Caller states her blood sugar has been elevated. 165 in am yesterday, 191 in am today before eating. She was put on Metformin in April and its giving her frequent & watery diarrhea. Has been trying to eat right, more vegetables, etc. Has had some neuropathy/feet burning. Has the patient traveled out of the country within the last 30 days? ---Not Applicable Does the patient require triage? ---Yes Related visit to physician within the last 2 weeks? ---No Does the PT have any chronic conditions? (i.e. diabetes, asthma, etc.) ---Yes List chronic conditions. ---diabetes, BP rx, asthma Guidelines Guideline Title Affirmed Question Affirmed Notes Diabetes - High Blood Sugar [1] Blood glucose > 240 mg/dl (13 mmol/ l) AND [2] does not use insulin (e.g., not insulin-dependent; most type 2 diabetics) (all triage questions negative) Final Disposition User See Physician within [place], RN, [employer] states she is on Metformin 500 mg twice a day, but had to go off of it for about a week d/t diarrhea. She has been back on it now for about a week. States the diarrhea is every day. Had one episode of vomiting yesterday d/t the heat & eating before going outside. Referrals REFERRED TO PCP OFFICE Disagree/Comply: Comply

## 2015-01-23 NOTE — Telephone Encounter (Signed)
Appointment with MD Sarajane Jews tomorrow afternoon

## 2015-01-24 ENCOUNTER — Ambulatory Visit: Payer: Self-pay | Admitting: Family Medicine

## 2015-01-25 ENCOUNTER — Encounter: Payer: Self-pay | Admitting: Adult Health

## 2015-01-25 ENCOUNTER — Telehealth: Payer: Self-pay | Admitting: Adult Health

## 2015-01-25 ENCOUNTER — Ambulatory Visit (INDEPENDENT_AMBULATORY_CARE_PROVIDER_SITE_OTHER): Payer: 59 | Admitting: Adult Health

## 2015-01-25 VITALS — BP 110/70 | Temp 98.4°F | Ht 67.0 in | Wt 208.1 lb

## 2015-01-25 DIAGNOSIS — E1165 Type 2 diabetes mellitus with hyperglycemia: Secondary | ICD-10-CM

## 2015-01-25 DIAGNOSIS — IMO0002 Reserved for concepts with insufficient information to code with codable children: Secondary | ICD-10-CM

## 2015-01-25 LAB — BASIC METABOLIC PANEL
BUN: 15 mg/dL (ref 6–23)
CO2: 31 mEq/L (ref 19–32)
Calcium: 9.8 mg/dL (ref 8.4–10.5)
Chloride: 95 mEq/L — ABNORMAL LOW (ref 96–112)
Creatinine, Ser: 0.72 mg/dL (ref 0.40–1.20)
GFR: 104.68 mL/min (ref >60.00)
Glucose, Bld: 72 mg/dL (ref 70–99)
Potassium: 2.9 mEq/L — ABNORMAL LOW (ref 3.5–5.1)
Sodium: 135 mEq/L (ref 135–145)

## 2015-01-25 LAB — HEMOGLOBIN A1C: Hgb A1c MFr Bld: 6.7 % — ABNORMAL HIGH (ref 4.6–6.5)

## 2015-01-25 MED ORDER — POTASSIUM CHLORIDE ER 10 MEQ PO TBCR: 20.0000 meq | EXTENDED_RELEASE_TABLET | Freq: Two times a day (BID) | ORAL | Status: DC

## 2015-01-25 NOTE — Telephone Encounter (Signed)
Spoke to patient on the phone. Informed her that her A1c is 6.7. I am going to see how her body handles 250mg  Metformin BID. Her potassium is also low at 2.9 - likely from diarrhea. Will supplement with two weeks of K-Dur 9meq.

## 2015-01-25 NOTE — Progress Notes (Signed)
Subjective:    Patient ID: Gloria [name], female    DOB: Sep 25, 1950, 64 y.o.   MRN: [id number]  HPI  64 year old female who presents to the office today for her elevated blood sugars. She has been off of her Metformin for two weeks due to diarrhea, she restarted the Metformin last week. Has not had any diarrhea or abdominal pain since restarting Metformin.   Her blood sugars have been as high as 243 on 01/20/2015. This morning her blood sugar was 128- fasting.   She is eating healthier but is not exercising.   She is also complaining of burning in her legs on occasion. The burning has improved since restarting the Metformin.    Review of Systems  Respiratory: Negative.   Gastrointestinal: Negative.   Musculoskeletal: Negative.   Skin: Negative.   Neurological: Negative.   Psychiatric/Behavioral: Negative.   All other systems reviewed and are negative.  Past Medical History  Diagnosis Date  . Depressive disorder, not elsewhere classified   . Other and unspecified hyperlipidemia   . Unspecified essential hypertension   . Migraine, unspecified, without mention of intractable migraine without mention of status migrainosus   . Unspecified urinary incontinence   . Arthritis   . Chronic bronchitis   . Anxiety state, unspecified   . Postmenopausal symptoms   . Panic attacks   . Allergic rhinitis, cause unspecified 03/30/2013  . Borderline diabetes mellitus     a1c 7.0 (07/26/13)     History   Social History  . Marital Status: Divorced    Spouse Name: N/A  . Number of Children: 1  . Years of Education: N/A   Occupational History  . Self employed-interior design/flower arrangements    Social History Main Topics  . Smoking status: Never Smoker   . Smokeless tobacco: Never Used  . Alcohol Use: No  . Drug Use: No  . Sexual Activity: Not Currently   Other Topics Concern  . Not on file   Social History Narrative   -Retired - does Company secretary   -Divorced in 2007   -One son  - lives locally. 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 and entertain family. Bowling. Interior decorating          Past Surgical History  Procedure Laterality Date  . Abdominal hysterectomy  2002  . Dislocated shoulder      right  . Diagnostic laparoscopy    . Tumor right ankle      Family History  Problem Relation Age of Onset  . Arthritis      family history  . Hypertension      grandparent  . Colon cancer Maternal Grandfather   . Asthma Son     Allergies  Allergen Reactions  . Compazine Other (See Comments)    Stroke like symptoms  . Haloperidol Decanoate Other (See Comments)    Extrapyramidal syndrome  . Penicillins Nausea And Vomiting  . Codeine Itching  . Lisinopril Cough    Current Outpatient Prescriptions on File Prior to Visit  Medication Sig Dispense Refill  . albuterol (PROVENTIL) (2.5 MG/3ML) 0.083% nebulizer solution Inhale 3 mLs (2.5 mg total) into the lungs every 4 (four) hours as needed for wheezing or shortness of breath (((PLAN C))). 75 mL 2  . ALPRAZolam (XANAX) 1 MG tablet Take 1 mg by mouth daily as needed for anxiety.    . budesonide-formoterol (SYMBICORT) 80-4.5 MCG/ACT inhaler Take 2  puffs first thing in am and then another 2 puffs about 12 hours later. 1 Inhaler 11  . Cyanocobalamin (VITAMIN B 12 PO) Take 1 capsule by mouth daily.      Marland Kitchen dextromethorphan-guaiFENesin (MUCINEX DM) 30-600 MG per 12 hr tablet Take 1 tablet by mouth 2 (two) times daily as needed (w/ flutter valve).    . fexofenadine (ALLEGRA) 180 MG tablet Take 1 tablet (180 mg total) by mouth daily. 30 tablet 5  . fluticasone (FLONASE) 50 MCG/ACT nasal spray Place 2 sprays into both nostrils daily as needed for allergies or rhinitis. 1 g 11  . glucose blood (ACCU-CHEK AVIVA) test strip Use as instructed 100 each 12  . hydrochlorothiazide (HYDRODIURIL) 50 MG tablet Take 1 tablet (50 mg total) by mouth daily. 30 tablet 11  .  HYDROcodone-acetaminophen (NORCO/VICODIN) 5-325 MG per tablet Take 1-2 tablets by mouth every 4 (four) hours as needed. 40 tablet 0  . irbesartan (AVAPRO) 150 MG tablet Take 1 tablet (150 mg total) by mouth daily. 30 tablet 11  . metFORMIN (GLUCOPHAGE) 500 MG tablet Take 1 tablet (500 mg total) by mouth 2 (two) times daily with a meal. 180 tablet 3  . mometasone-formoterol (DULERA) 100-5 MCG/ACT AERO Inhale 2 puffs into the lungs every morning. 8.8 g 3  . omeprazole (PRILOSEC) 40 MG capsule Take 1 capsule (40 mg total) by mouth daily. 30 capsule 11  . Respiratory Therapy Supplies (FLUTTER) DEVI Use as directed 1 each 0   No current facility-administered medications on file prior to visit.    BP 110/70 mmHg  Temp(Src) 98.4 F (36.9 C) (Oral)  Ht 5\' 7"  (1.702 m)  Wt 208 lb 1.6 oz (94.394 kg)  BMI 32.59 kg/m2       Objective:   Physical Exam  Constitutional: She is oriented to person, place, and time. She appears well-developed and well-nourished.  obese  Cardiovascular: Normal rate, regular rhythm, normal heart sounds and intact distal pulses.  Exam reveals no gallop and no friction rub.   No murmur heard. Pulmonary/Chest: Effort normal and breath sounds normal. No respiratory distress. She has no wheezes. She has no rales. She exhibits no tenderness.  Musculoskeletal: Normal range of motion. She exhibits no edema or tenderness.  Neurological: She is alert and oriented to person, place, and time. She has normal reflexes.  Skin: Skin is warm and dry. No rash noted. She is not diaphoretic. No erythema. No pallor.  Psychiatric: She has a normal mood and affect. Her behavior is normal. Judgment and thought content normal.  Nursing note and vitals reviewed.      Assessment & Plan:  1. Diabetes type 2, uncontrolled - Decrease Metformin to 250mg  BID - Basic metabolic panel - Hemoglobin A1c - Will follow up on labs  - Consider adding Glipizide - Information given on diabetic diet and  the importance of exercise.  - Foot exam complete- No decreased sensation in either foot.

## 2015-01-25 NOTE — Patient Instructions (Signed)
It was great seeing you again!  Decrease your Metformin to 250mg  (1/2 pill) in the morning and night  I will follow up with you regarding your A1c  Follow a diabetic diet- there are a lot of meals plans online.   Follow up soon for a complete physical.

## 2015-01-31 ENCOUNTER — Ambulatory Visit: Payer: 59 | Admitting: Adult Health

## 2015-02-23 ENCOUNTER — Telehealth: Payer: Self-pay | Admitting: Adult Health

## 2015-02-23 ENCOUNTER — Other Ambulatory Visit: Payer: Self-pay | Admitting: Adult Health

## 2015-02-23 DIAGNOSIS — E876 Hypokalemia: Secondary | ICD-10-CM

## 2015-02-23 NOTE — Telephone Encounter (Signed)
Can we get her to come back in for repeat BMP?

## 2015-02-23 NOTE — Telephone Encounter (Signed)
Appointment made and lab order placed

## 2015-02-24 ENCOUNTER — Other Ambulatory Visit: Payer: 59

## 2015-03-01 ENCOUNTER — Other Ambulatory Visit (INDEPENDENT_AMBULATORY_CARE_PROVIDER_SITE_OTHER): Payer: 59

## 2015-03-01 DIAGNOSIS — E876 Hypokalemia: Secondary | ICD-10-CM | POA: Diagnosis not present

## 2015-03-01 LAB — BASIC METABOLIC PANEL
BUN: 14 mg/dL (ref 6–23)
CO2: 29 mEq/L (ref 19–32)
Calcium: 9.8 mg/dL (ref 8.4–10.5)
Chloride: 100 mEq/L (ref 96–112)
Creatinine, Ser: 0.91 mg/dL (ref 0.40–1.20)
GFR: 79.87 mL/min (ref >60.00)
Glucose, Bld: 113 mg/dL — ABNORMAL HIGH (ref 70–99)
Potassium: 3.7 mEq/L (ref 3.5–5.1)
Sodium: 138 mEq/L (ref 135–145)

## 2015-03-07 ENCOUNTER — Telehealth: Payer: Self-pay | Admitting: Adult Health

## 2015-03-07 DIAGNOSIS — G43909 Migraine, unspecified, not intractable, without status migrainosus: Secondary | ICD-10-CM

## 2015-03-07 NOTE — Telephone Encounter (Signed)
Ok per [facility] for referral to neurology.  It has been ordered.

## 2015-03-07 NOTE — Telephone Encounter (Signed)
Advised of labs, potassium level is normal. She should trial 25mg  HCTZ instead of 50mg . Monitor BP at home and inform me if it increases.

## 2015-03-07 NOTE — Telephone Encounter (Signed)
Pt has migraines and states cory is aware.  Pt would like referral to a neurologist, and pt would Like to see dr Para March dohmier  w/ [facility].  When pt gets they are very bad, but only gets 3-4 /yr, used to be every month until pt had hysterectomy

## 2015-03-16 ENCOUNTER — Telehealth: Payer: Self-pay | Admitting: Internal Medicine

## 2015-03-16 MED ORDER — BUDESONIDE-FORMOTEROL FUMARATE 80-4.5 MCG/ACT IN AERO: INHALATION_SPRAY | RESPIRATORY_TRACT | Status: DC

## 2015-03-16 MED ORDER — HYDROCODONE-ACETAMINOPHEN 5-325 MG PO TABS: 1.0000 | ORAL_TABLET | ORAL | Status: DC | PRN

## 2015-03-16 NOTE — Telephone Encounter (Signed)
Patient wants refill of Hydrocodone.  Patient says to tell Dr. Melvyn Novas that she only missed her last appointment because she had death in the family.  Last OV: 12/29/14 No OV scheduled.  Last refill: 12/29/14  Ok to refill?

## 2015-03-16 NOTE — Telephone Encounter (Signed)
appt scheduled to see MW next Friday. Aware she needs to bring meds and keep appt. She also needed symbicort sent in. I have done so. Nothing further needed

## 2015-03-16 NOTE — Telephone Encounter (Signed)
Make appt first, then give enough to last till then, ov with all bottles/spray,s nebs

## 2015-03-24 ENCOUNTER — Ambulatory Visit (INDEPENDENT_AMBULATORY_CARE_PROVIDER_SITE_OTHER): Payer: 59 | Admitting: Internal Medicine

## 2015-03-24 ENCOUNTER — Telehealth: Payer: Self-pay | Admitting: Adult Health

## 2015-03-24 ENCOUNTER — Encounter: Payer: Self-pay | Admitting: Internal Medicine

## 2015-03-24 VITALS — BP 152/90 | HR 98 | Wt 206.6 lb

## 2015-03-24 DIAGNOSIS — J45991 Cough variant asthma: Secondary | ICD-10-CM

## 2015-03-24 MED ORDER — MONTELUKAST SODIUM 10 MG PO TABS: 10.0000 mg | ORAL_TABLET | Freq: Every day | ORAL | Status: DC

## 2015-03-24 MED ORDER — HYDROCODONE-ACETAMINOPHEN 5-325 MG PO TABS: 1.0000 | ORAL_TABLET | ORAL | Status: DC | PRN

## 2015-03-24 NOTE — Patient Instructions (Signed)
Work on inhaler technique:  relax and gently blow all the way out then take a nice smooth deep breath back in, triggering the inhaler at same time you start breathing in.  Hold for up to 5 seconds if you can. Blow out thru nose. Rinse and gargle with water when done  Add singulair (montelukast) 10 mg each pm  See calendar for specific medication instructions and bring it back for each and every office visit for every healthcare provider you see.  Without it,  you may not receive the best quality medical care that we feel you deserve.  You will note that the calendar groups together  your maintenance  medications that are timed at particular times of the day.  Think of this as your checklist for what your doctor has instructed you to do until your next evaluation to see what benefit  there is  to staying on a consistent group of medications intended to keep you well.  The other group at the bottom is entirely up to you to use as you see fit  for specific symptoms that may arise between visits that require you to treat them on an as needed basis.  Think of this as your action plan or "what if" list.   Separating the top medications from the bottom group is fundamental to providing you adequate care going forward.    Please schedule a follow up office visit in 6 weeks, call sooner if needed

## 2015-03-24 NOTE — Telephone Encounter (Signed)
It is ok with me if she would like to switch back.

## 2015-03-24 NOTE — Telephone Encounter (Signed)
Patient used to be a patient of Dr. Jenny Reichmann and she would like to switch back to him Please advise

## 2015-03-24 NOTE — Progress Notes (Signed)
Subjective:    Patient ID: Gloria [name], female    DOB: October 27, 1950    MRN: [id number]    Brief patient profile:  12 yobf never smoker sickly child (? Pna, whooping cough, asthma) never full aerobic tolerance then better ages 48- 73 and no daily rx or need to go doctor and self rx with otcs like nyquil then much  Worse since October 02, 2004 when mother died and since then freq prns including  albuterol neb worse since October 03, 2011 and needing daily dulera 200 (though admits not consistent with it) referred 08/02/2013 by Dr Basilio Cairo for chronic variable cough worse since Jan 2015    History of Present Illness  08/02/2013 1st [facility] office visit/ Wert cc min prod (always white mucus) cough every day esp in am x sev years better on prednisone but "gained too much wt" from baseline 180  - wakes up daily 3 am smothering / hacking > goes to [facility] and uses dulera and helps and props up head and goes back to bed and then wakes up s flare. Also neb maybe once a month. Worse with pollen exp x 2 years  rec Pepcid ac 20 mg one at bedtime until return. GERD diet  Dulera 200 2bid Work on hfa   09/08/2013 acute  ov/Wert re: asthma/ cough > sob Chief Complaint  Patient presents with  . Acute Visit    Pt c/o increased cough, SOB and wheezing x 4 days. Cough is prod with white sputum. The past few days she has been waking up SOB and having to use albuterol nebs.   no sob p nebs/ still violent cough though no excess mucus rec Depromedrol 120 mg today Nexium  40 mg   Take 30-60 min before first meal of the day and Pepcid 20 mg one bedtime until return to office - this is the best way to tell whether stomach acid is contributing to your problem.   Stop hyzar and use valsartan 160/ 25 one daily  For short of breath > nebulizer every 4 hours For cough > hydrocone cough syrup. Once cough better use dulera Take 2 puffs first thing in am and then another 2 puffs about 12 hours later.  Please schedule a follow up office visit in  4 weeks, sooner if needed with pfts on return > did not return, request switch docs    03/29/2014 f/u ov/Wert re: pseudoasthma Chief Complaint  Patient presents with  . Acute Visit    Pt c/o increased cough- prod with yellow to brown sputum.  She also wheezing and increased SOB. She is using nebulizer with albuterol at least 5 times per day.   she does report improving p depomedrol but only for a few days then gradually back to baseline cough and sob, not clear she followed any of the instructions given.  Mucus turned clear on levaquin then back to brown w/in a few days of stopping it.  rec Continue to use the albuterol up to every 4 hours as needed but the goal is to minimize need for this  We will treat you with levaquin 500mg  daily for 7 day Take delsym two tsp every 12 hours and supplement if needed with hydrocodone  up to 2 every 4 hours to suppress the urge to cough. Swallowing water or using ice chips/non mint and menthol containing candies (such as lifesavers or sugarless jolly ranchers) are also effective.  You should rest your voice and avoid activities that you know make you cough.Once  you have eliminated the cough for 3 straight days try reducing the hydrocodone first,  then the delsym as tolerated.   fexofenadine 180mg  daily Start fluticasone nasal spray, 2 sprays each nostril daily Start nexium 40mg  Take 30- 60 min before your first and last meals of the day  Prednisone 10 mg take  4 each am x 2 days,   2 each am x 2 days,  1 each am x 2 days and stop  Use the flutter valve as much as possible  GERD  Diet    09/12/2014 f/u ov/Wert re: sick since January 2015  Chief Complaint  Patient presents with  . Acute Visit    pt has had cough,wheezing and chest tightness for the past month, Pt says mucus is green/ to brownish in color.  says never really cough free x > one year, confused with maint vs prns >>omnicef x 10 d and   10/03/2014 NP  Follow up : Asthma vs psuedoasthma /chronic  cough  Pt returns for  2 week follow up and med review .  She is feeling better but cough is not completely better.  Flare of AEAB last ov ,  tx w/ omnicef x 10 d. Changed from hyzaar to avapro /hctz.  Says norco helps and wants refill, discussed the side effects and addiction problems with narcotics.  Advised will give limited refills in hopes to control cough by other means.  B/p is doing okay.  We reviewed all her meds and organized them into a med calendar with pt education  Stopped pepcid . Discussed restarting.  No chest pain, orthopnea, edema or fever.  Workup has shown : NML  PFT w/ no sign BD response . 05/2014  nml cXR 03/2014  Using Dulera As needed  , discussed proair as rescue.  rec Follow med calendar closely and bring to each visit .  We are setting you up for labs and CT sinus> neg  Stop use Dulera .  ProAir and Albuterol are your rescue inhalers to be used if needed.  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 Cough    Reports increased wheezing depending on the weather.  wakes up each day around 7 am feeling ok but when walks across the room  Starts wheezing > cough  Started back dulera 200 but not using correctly (see a/p) and down to zero but didn't know it was empty  Says has flutter valve not clear she's using it  rec dulera 100 2bid  Follow med calendar    12/29/2014 f/u ov/Wert re: recurrent cough / no med calendar/ no meds  Chief Complaint  Patient presents with  . Follow-up    Pt states that her cough and SOB are unchanged. She is only using Dulera prn "not that often".   not following any of the instructions we gave her and very negative overall about her care except for stating always gets better p steroid rx  rec Start symbicort 80 Take 2 puffs first thing in am and then another 2 puffs about 12 hours  later.     03/24/2015 f/u ov/Wert re: recurrent cough/ no med calendar  Chief Complaint  Patient presents with  . Follow-up    Pt states that her breathing is doing well.    Not limited by breathing from desired activities  /  coughing needs norco in am and around 4pm   No obvious patterns in day to day or daytime variabilty or assoc excess or purulent sputum or  cp or chest tightness,   overt sinus or hb symptoms. No unusual exp hx or h/o childhood pna/ asthma or knowledge of premature birth.  Sleeping ok without nocturnal  or early am exacerbation  of respiratory  c/o's or need for noct saba. Also denies any obvious fluctuation of symptoms with weather or environmental changes or other aggravating or alleviating factors except as outlined above   Current Medications, Allergies, Complete Past Medical History, Past Surgical History, Family History, and Social History were reviewed in [employer] record.  ROS  The following are not active complaints unless bolded sore throat, dysphagia, dental problems, itching, sneezing,  nasal congestion or excess/ purulent secretions, ear ache,   fever, chills, sweats, unintended wt loss, pleuritic or exertional cp, hemoptysis,  orthopnea pnd or leg swelling, presyncope, palpitations, heartburn, abdominal pain, anorexia, nausea, vomiting, diarrhea  or change in bowel or urinary habits, change in stools or urine, dysuria,hematuria,  rash, arthralgias, visual complaints, headache, numbness weakness or ataxia or problems with walking or coordination,  change in mood/affect or memory.                  Objective:   Physical Exam  amb obese bf nad     12/29/14           209 > 03/24/2015    207  Wt Readings from Last 3 Encounters:  11/03/14 212 lb (96.163 kg)  10/17/14 213 lb 1.6 oz (96.662 kg)  10/03/14 214 lb (97.07 kg)   vs 09/08/2013        215  > 03/29/2014  199      Vital signs reviewed    HEENT: nl dentition, turbinates,  and orophanx. Nl external ear canals without cough reflex   NECK :  without JVD/Nodes/TM/ nl carotid upstrokes bilaterally   LUNGS: no acc muscle use,   Lungs clear bilaterally    CV:  RRR  no s3 or murmur or increase in P2, no edema   ABD:  soft and nontender with nl excursion in the supine position. No bruits or organomegaly, bowel sounds nl  MS:  warm without deformities, calf tenderness, cyanosis or clubbing  SKIN: warm and dry without lesions    NEURO:  alert, approp, no deficits              Assessment & Plan:

## 2015-03-25 NOTE — Telephone Encounter (Signed)
Ok with me 

## 2015-03-26 ENCOUNTER — Encounter: Payer: Self-pay | Admitting: Internal Medicine

## 2015-03-26 NOTE — Assessment & Plan Note (Signed)
Body mass index is 32.35 trending down sltly  Lab Results  Component Value Date   TSH 2.79 03/29/2014     Contributing to gerd tendency/ doe/reviewed the need and the process to achieve and maintain neg calorie balance > defer f/u primary care including intermittently monitoring thyroid status

## 2015-03-26 NOTE — Assessment & Plan Note (Signed)
-   12/29/14  p extensive coaching HFA effectiveness =    75% > rechallenge with symibcort 80 2bid - 03/24/2015  extensive coaching HFA effectiveness =    50%  - Added singulair 03/24/2015   So far been unable to prove or disprove cough variant asthma because she hasn't been consistent with her medications nor adherent with the medication counter she was provided.   I had an extended discussion with the patient reviewing all relevant studies completed to date and  lasting 15 to 20 minutes of a 25 minute visit on the following ongoing concerns:   Unlike when you get a prescription for eyeglasses, it's not possible to always walk out of this or any medical office with a perfect prescription that is immediately effective  based on any test that we offer here.    On the contrary, it may take several weeks for the full impact of changes recommened today - hopefully you will respond well.  If not, then we'll adjust your medication on your next visit accordingly, knowing more then than we can possibly know now.      The only way to be sure we are getting things right is to do a trust but verify approach here.  The proper method of use, as well as anticipated side effects, of a metered-dose inhaler are discussed and demonstrated to the patient. Improved effectiveness after extensive coaching during this visit to a level of approximately  50% but no better  Try adding singulair each pm    Each maintenance medication was reviewed in detail including most importantly the difference between maintenance and as needed and under what circumstances the prns are to be used. This was done in the context of a newly printed medication calendar   which provided the patient with a user-friendly unambiguous mechanism for medication administration and reconciliation and provides an action plan for all active problems. It is critical that this be shown to every doctor  for modification during the office visit if necessary so  the patient can use it as a working document.

## 2015-03-27 ENCOUNTER — Telehealth: Payer: Self-pay | Admitting: Adult Health

## 2015-03-27 NOTE — Telephone Encounter (Signed)
I have no idea.

## 2015-03-27 NOTE — Telephone Encounter (Signed)
Pt informed. She will call back when she is ready to make appt

## 2015-03-27 NOTE — Telephone Encounter (Signed)
Lori from [place] called stating that Gloria [name] is going to transfer there and that she would like for [place] to call her when her paper work is ready for pick up.

## 2015-03-28 NOTE — Telephone Encounter (Signed)
Gloria [name] called and left voicemail saying her life insurance agent is Iona Beard -- phone # [phone].

## 2015-03-28 NOTE — Telephone Encounter (Signed)
Called and spoke with pt and pt states Gloria [name] should have received paper work from [employer] in [place].  Advised pt that [place] and I did not receive this paperwork. Asked pt to call the insurance company and the paperwork re-sent.  Pt states the papers should have come in the mail.  Pt will return my call because she is at a doctor's appointment.

## 2015-03-30 NOTE — Telephone Encounter (Signed)
Pt called back and states she spoke with her insurance agent and he states there is nothing he can do because he had already sent the fax over in August.  Advised pt again that we did not receive that information. I have checked pt's chart, my held paperwork file, and with Dr. Erick Blinks assistant. Advised pt to see if she could gather more information and call the office if needed. Pt verbalized understanding.

## 2015-03-30 NOTE — Telephone Encounter (Signed)
Called and advised pt that she would have to contact the insurance agent directly and have the paperwork faxed to the office.

## 2015-05-04 ENCOUNTER — Ambulatory Visit: Payer: 59 | Admitting: Neurology

## 2015-05-22 ENCOUNTER — Other Ambulatory Visit: Payer: Self-pay | Admitting: Internal Medicine

## 2015-05-26 ENCOUNTER — Telehealth: Payer: Self-pay | Admitting: *Deleted

## 2015-05-26 NOTE — Telephone Encounter (Signed)
PA for symbicort KEY: [id number] Attempted to do PA and received message Electronic Prior Authorization case cannot be initiated for this member as the plan is not supported. Please contact phone number on the back of the member prescription ID card. Called # [phone] and office closed today. [facility] Monday AM

## 2015-05-30 ENCOUNTER — Telehealth: Payer: Self-pay | Admitting: Internal Medicine

## 2015-05-30 NOTE — Telephone Encounter (Signed)
Give samples until ov with formulary in hand and all active meds in hand as well, to see Tammy NP or me

## 2015-05-30 NOTE — Telephone Encounter (Signed)
Called and spoke with pt and informed of MW rec  Pt voiced understanding and stated she would call her insurance for formulary Informed pt that at this time we do not have samples to give right now but to call office back to see if we have any later     Nothing further is needed at this time.

## 2015-05-30 NOTE — Telephone Encounter (Signed)
Spoke with pt this afternoon Pt requesting refill on her Hydrocodone 5/325mg  1-2 tablets q 4 hours as needed for pain Last filled 03/24/15 #40 with no additional refills Last OV 03/24/15 No follow up appt made at this time  Pt states that medication helps her shortness of breath  Dr Melvyn Novas, please advise if you are ok with this refill. Thanks

## 2015-05-30 NOTE — Telephone Encounter (Signed)
Called # provided below and was transferred 3 times and ws placed on hold x 16 min w/o response. received PA for pt symbicort. Dr. Melvyn Novas would you like to change medication or do PA? thanks

## 2015-05-30 NOTE — Telephone Encounter (Signed)
Ask her to first see Tammy NP with all meds to give her new med calendar with all meds in hand or can't prescribe meds thru this office - give her enough norco refills to bridge the space until the ov using no more than bid dosing

## 2015-05-31 MED ORDER — HYDROCODONE-ACETAMINOPHEN 5-325 MG PO TABS: 1.0000 | ORAL_TABLET | ORAL | Status: DC | PRN

## 2015-05-31 NOTE — Telephone Encounter (Signed)
Called spoke with pt. She scheduled appt at [facility] with TP for med calendar 06/23/15 with TP. RX for norco printed off. Will have MW sign this afternoon and call pt when ready for pick up.  I gave RX to Sheena to have MW sign and return to triage once done.

## 2015-05-31 NOTE — Telephone Encounter (Signed)
[phone] calling back

## 2015-05-31 NOTE — Telephone Encounter (Signed)
Rx given to MW to sign. Rx given to Oblong in Triage.

## 2015-05-31 NOTE — Telephone Encounter (Signed)
RX placed for pick up. Pt is aware. Nothing further needed

## 2015-06-08 ENCOUNTER — Ambulatory Visit: Payer: Self-pay | Admitting: Neurology

## 2015-06-09 ENCOUNTER — Telehealth: Payer: Self-pay | Admitting: Internal Medicine

## 2015-06-09 ENCOUNTER — Encounter: Payer: Self-pay | Admitting: Internal Medicine

## 2015-06-09 ENCOUNTER — Ambulatory Visit (INDEPENDENT_AMBULATORY_CARE_PROVIDER_SITE_OTHER): Payer: Medicare Other | Admitting: Internal Medicine

## 2015-06-09 VITALS — BP 142/94 | HR 93 | Temp 98.2°F | Ht 67.0 in | Wt 211.0 lb

## 2015-06-09 DIAGNOSIS — E876 Hypokalemia: Secondary | ICD-10-CM | POA: Diagnosis not present

## 2015-06-09 DIAGNOSIS — E785 Hyperlipidemia, unspecified: Secondary | ICD-10-CM

## 2015-06-09 DIAGNOSIS — I10 Essential (primary) hypertension: Secondary | ICD-10-CM | POA: Diagnosis not present

## 2015-06-09 MED ORDER — POTASSIUM CHLORIDE ER 10 MEQ PO TBCR: 10.0000 meq | EXTENDED_RELEASE_TABLET | Freq: Every day | ORAL | Status: DC

## 2015-06-09 NOTE — Patient Instructions (Signed)
Please continue all other medications as before, and refills have been done if requested.  Please have the pharmacy call with any other refills you may need.  Please continue your efforts at being more active, low cholesterol diet, and weight control.  You are otherwise up to date with prevention measures today.  Please keep your appointments with your specialists as you may have planned  Please return in 6 months, or sooner if needed 

## 2015-06-09 NOTE — Progress Notes (Signed)
Subjective:    Patient ID: Gloria [name], female    DOB: 10-30-1950, 64 y.o.   MRN: [id number]  HPI  Here to f/u; overall doing ok,  Pt denies chest pain, increasing sob or doe, wheezing, orthopnea, PND, increased LE swelling, palpitations, dizziness or syncope.  Pt denies new neurological symptoms such as new headache, or facial or extremity weakness or numbness.  Pt denies polydipsia, polyuria, or low sugar episode.   Pt denies new neurological symptoms such as new headache, or facial or extremity weakness or numbness.   Pt states overall good compliance with meds, mostly trying to follow appropriate diet, with wt overall stable,  but little exercise however. Declines immunizatiuons or labs work today.  Has had recent low K with need for repolacement, still on diuretic.   Past Medical History  Diagnosis Date  . Depressive disorder, not elsewhere classified   . Other and unspecified hyperlipidemia   . Unspecified essential hypertension   . Migraine, unspecified, without mention of intractable migraine without mention of status migrainosus   . Unspecified urinary incontinence   . Arthritis   . Chronic bronchitis   . Anxiety state, unspecified   . Postmenopausal symptoms   . Panic attacks   . Allergic rhinitis, cause unspecified 03/30/2013  . Borderline diabetes mellitus     a1c 7.0 (07/26/13)    Past Surgical History  Procedure Laterality Date  . Abdominal hysterectomy  2002  . Dislocated shoulder      right  . Diagnostic laparoscopy    . Tumor right ankle      reports that she has never smoked. She has never used smokeless tobacco. She reports that she does not drink alcohol or use illicit drugs. family history includes Arthritis in an other family member; Asthma in her son; Colon cancer in her maternal grandfather; Hypertension in an other family member. Allergies  Allergen Reactions  . Compazine Other (See Comments)    Stroke like symptoms  . Haloperidol Decanoate Other (See  Comments)    Extrapyramidal syndrome  . Penicillins Nausea And Vomiting  . Codeine Itching  . Lisinopril Cough   Current Outpatient Prescriptions on File Prior to Visit  Medication Sig Dispense Refill  . albuterol (PROVENTIL) (2.5 MG/3ML) 0.083% nebulizer solution Inhale 3 mLs (2.5 mg total) into the lungs every 4 (four) hours as needed for wheezing or shortness of breath (((PLAN C))). 75 mL 2  . budesonide-formoterol (SYMBICORT) 80-4.5 MCG/ACT inhaler Take 2 puffs first thing in am and then another 2 puffs about 12 hours later. 1 Inhaler 11  . dextromethorphan-guaiFENesin (MUCINEX DM) 30-600 MG per 12 hr tablet Take 1 tablet by mouth 2 (two) times daily as needed (w/ flutter valve).    . fexofenadine (ALLEGRA) 180 MG tablet Take 1 tablet (180 mg total) by mouth daily. 30 tablet 5  . fluticasone (FLONASE) 50 MCG/ACT nasal spray Place 2 sprays into both nostrils daily as needed for allergies or rhinitis. 1 g 11  . glucose blood (ACCU-CHEK AVIVA) test strip Use as instructed 100 each 12  . hydrochlorothiazide (HYDRODIURIL) 50 MG tablet Take 1 tablet (50 mg total) by mouth daily. 30 tablet 11  . HYDROcodone-acetaminophen (NORCO/VICODIN) 5-325 MG tablet Take 1-2 tablets by mouth every 4 (four) hours as needed. 40 tablet 0  . irbesartan (AVAPRO) 150 MG tablet Take 1 tablet (150 mg total) by mouth daily. 30 tablet 11  . montelukast (SINGULAIR) 10 MG tablet Take 1 tablet (10 mg total) by mouth at bedtime.  30 tablet 11  . omeprazole (PRILOSEC) 40 MG capsule TAKE ONE CAPSULE BY MOUTH DAILY 30 capsule 0  . Respiratory Therapy Supplies (FLUTTER) DEVI Use as directed 1 each 0  . metFORMIN (GLUCOPHAGE) 500 MG tablet Take 1 tablet (500 mg total) by mouth 2 (two) times daily with a meal. (Patient not taking: Reported on 06/09/2015) 180 tablet 3   No current facility-administered medications on file prior to visit.     Review of Systems  Constitutional: Negative for unusual diaphoresis or night  sweats HENT: Negative for ringing in ear or discharge Eyes: Negative for double vision or worsening visual disturbance.  Respiratory: Negative for choking and stridor.   Gastrointestinal: Negative for vomiting or other signifcant bowel change Genitourinary: Negative for hematuria or change in urine volume.  Musculoskeletal: Negative for other MSK pain or swelling Skin: Negative for color change and worsening wound.  Neurological: Negative for tremors and numbness other than noted  Psychiatric/Behavioral: Negative for decreased concentration or agitation other than above       Objective:   Physical Exam BP 142/94 mmHg  Pulse 93  Temp(Src) 98.2 F (36.8 C) (Oral)  Ht 5\' 7"  (1.702 m)  Wt 211 lb (95.709 kg)  BMI 33.04 kg/m2  SpO2 96% VS noted, not ill appearing Constitutional: Pt appears in no significant distress HENT: Head: NCAT.  Right Ear: External ear normal.  Left Ear: External ear normal.  Eyes: . Pupils are equal, round, and reactive to light. Conjunctivae and EOM are normal Neck: Normal range of motion. Neck supple.  Cardiovascular: Normal rate and regular rhythm.   Pulmonary/Chest: Effort normal and breath sounds without rales or wheezing.  Abd:  Soft, NT, ND, + BS Neurological: Pt is alert. Not confused , motor grossly intact Skin: Skin is warm. No rash, no LE edema Psychiatric: Pt behavior is normal. No agitation.     Assessment & Plan:

## 2015-06-09 NOTE — Telephone Encounter (Signed)
Called and spoke with pt Pt requesting samples of symbicort for pick up Pt informed that samples would be placed up front for pick up  Nothing further is needed

## 2015-06-09 NOTE — Progress Notes (Signed)
Pre visit review using our clinic review tool, if applicable. No additional management support is needed unless otherwise documented below in the visit note. 

## 2015-06-10 NOTE — Assessment & Plan Note (Signed)
stable overall by history and exam, recent data reviewed with pt, and pt to continue medical treatment as before,  to f/u any worsening symptoms or concerns BP Readings from Last 3 Encounters:  06/09/15 142/94  03/24/15 152/90  01/25/15 110/70

## 2015-06-10 NOTE — Assessment & Plan Note (Signed)
On chronic diuretic, ok for daily kdur 10 qd,  to f/u any worsening symptoms or concerns, f/u lab next visit

## 2015-06-10 NOTE — Assessment & Plan Note (Signed)
stable overall by history and exam, recent data reviewed with pt, and pt to continue medical treatment as before,  to f/u any worsening symptoms or concerns Lab Results  Component Value Date   CHOL 141 07/26/2013   CHOL 176 12/05/2010   CHOL 180 01/25/2010   Lab Results  Component Value Date   HDL 34.10* 07/26/2013   HDL 48.00 12/05/2010   HDL 38 01/25/2010   No results found for: [facility] Lab Results  Component Value Date   TRIG 322.0* 07/26/2013   TRIG 274.0* 12/05/2010   TRIG 558 01/25/2010   Lab Results  Component Value Date   CHOLHDL 4 07/26/2013   CHOLHDL 4 12/05/2010   Lab Results  Component Value Date   LDLDIRECT 65.8 07/26/2013   LDLDIRECT 96.3 12/05/2010

## 2015-06-16 DIAGNOSIS — H11153 Pinguecula, bilateral: Secondary | ICD-10-CM | POA: Diagnosis not present

## 2015-06-16 DIAGNOSIS — H11423 Conjunctival edema, bilateral: Secondary | ICD-10-CM | POA: Diagnosis not present

## 2015-06-16 DIAGNOSIS — H52223 Regular astigmatism, bilateral: Secondary | ICD-10-CM | POA: Diagnosis not present

## 2015-06-16 DIAGNOSIS — E119 Type 2 diabetes mellitus without complications: Secondary | ICD-10-CM | POA: Diagnosis not present

## 2015-06-16 DIAGNOSIS — H2513 Age-related nuclear cataract, bilateral: Secondary | ICD-10-CM | POA: Diagnosis not present

## 2015-06-16 DIAGNOSIS — Z7984 Long term (current) use of oral hypoglycemic drugs: Secondary | ICD-10-CM | POA: Diagnosis not present

## 2015-06-19 ENCOUNTER — Other Ambulatory Visit: Payer: Self-pay | Admitting: Internal Medicine

## 2015-06-23 ENCOUNTER — Ambulatory Visit (INDEPENDENT_AMBULATORY_CARE_PROVIDER_SITE_OTHER): Payer: Medicare Other | Admitting: Adult Health

## 2015-06-23 ENCOUNTER — Encounter: Payer: Self-pay | Admitting: Adult Health

## 2015-06-23 VITALS — BP 136/74 | HR 89 | Temp 98.0°F | Ht 66.0 in | Wt 208.0 lb

## 2015-06-23 DIAGNOSIS — R05 Cough: Secondary | ICD-10-CM | POA: Diagnosis not present

## 2015-06-23 DIAGNOSIS — J45991 Cough variant asthma: Secondary | ICD-10-CM | POA: Diagnosis not present

## 2015-06-23 DIAGNOSIS — R058 Other specified cough: Secondary | ICD-10-CM

## 2015-06-23 MED ORDER — BUDESONIDE-FORMOTEROL FUMARATE 80-4.5 MCG/ACT IN AERO: 2.0000 | INHALATION_SPRAY | Freq: Two times a day (BID) | RESPIRATORY_TRACT | Status: DC

## 2015-06-23 NOTE — Progress Notes (Signed)
Subjective:    Patient ID: Gloria [name], female    DOB: October 27, 1950    MRN: [id number]    Brief patient profile:  12 yobf never smoker sickly child (? Pna, whooping cough, asthma) never full aerobic tolerance then better ages 48- 73 and no daily rx or need to go doctor and self rx with otcs like nyquil then much  Worse since October 02, 2004 when mother died and since then freq prns including  albuterol neb worse since October 03, 2011 and needing daily dulera 200 (though admits not consistent with it) referred 08/02/2013 by Dr Basilio Cairo for chronic variable cough worse since Jan 2015    History of Present Illness  08/02/2013 1st [facility] office visit/ Wert cc min prod (always white mucus) cough every day esp in am x sev years better on prednisone but "gained too much wt" from baseline 180  - wakes up daily 3 am smothering / hacking > goes to [facility] and uses dulera and helps and props up head and goes back to bed and then wakes up s flare. Also neb maybe once a month. Worse with pollen exp x 2 years  rec Pepcid ac 20 mg one at bedtime until return. GERD diet  Dulera 200 2bid Work on hfa   09/08/2013 acute  ov/Wert re: asthma/ cough > sob Chief Complaint  Patient presents with  . Acute Visit    Pt c/o increased cough, SOB and wheezing x 4 days. Cough is prod with white sputum. The past few days she has been waking up SOB and having to use albuterol nebs.   no sob p nebs/ still violent cough though no excess mucus rec Depromedrol 120 mg today Nexium  40 mg   Take 30-60 min before first meal of the day and Pepcid 20 mg one bedtime until return to office - this is the best way to tell whether stomach acid is contributing to your problem.   Stop hyzar and use valsartan 160/ 25 one daily  For short of breath > nebulizer every 4 hours For cough > hydrocone cough syrup. Once cough better use dulera Take 2 puffs first thing in am and then another 2 puffs about 12 hours later.  Please schedule a follow up office visit in  4 weeks, sooner if needed with pfts on return > did not return, request switch docs    03/29/2014 f/u ov/Wert re: pseudoasthma Chief Complaint  Patient presents with  . Acute Visit    Pt c/o increased cough- prod with yellow to brown sputum.  She also wheezing and increased SOB. She is using nebulizer with albuterol at least 5 times per day.   she does report improving p depomedrol but only for a few days then gradually back to baseline cough and sob, not clear she followed any of the instructions given.  Mucus turned clear on levaquin then back to brown w/in a few days of stopping it.  rec Continue to use the albuterol up to every 4 hours as needed but the goal is to minimize need for this  We will treat you with levaquin 500mg  daily for 7 day Take delsym two tsp every 12 hours and supplement if needed with hydrocodone  up to 2 every 4 hours to suppress the urge to cough. Swallowing water or using ice chips/non mint and menthol containing candies (such as lifesavers or sugarless jolly ranchers) are also effective.  You should rest your voice and avoid activities that you know make you cough.Once  you have eliminated the cough for 3 straight days try reducing the hydrocodone first,  then the delsym as tolerated.   fexofenadine 180mg  daily Start fluticasone nasal spray, 2 sprays each nostril daily Start nexium 40mg  Take 30- 60 min before your first and last meals of the day  Prednisone 10 mg take  4 each am x 2 days,   2 each am x 2 days,  1 each am x 2 days and stop  Use the flutter valve as much as possible  GERD  Diet    09/12/2014 f/u ov/Wert re: sick since January 2015  Chief Complaint  Patient presents with  . Acute Visit    pt has had cough,wheezing and chest tightness for the past month, Pt says mucus is green/ to brownish in color.  says never really cough free x > one year, confused with maint vs prns >>omnicef x 10 d and   10/03/2014 NP  Follow up : Asthma vs psuedoasthma /chronic  cough  Pt returns for  2 week follow up and med review .  She is feeling better but cough is not completely better.  Flare of AEAB last ov ,  tx w/ omnicef x 10 d. Changed from hyzaar to avapro /hctz.  Says norco helps and wants refill, discussed the side effects and addiction problems with narcotics.  Advised will give limited refills in hopes to control cough by other means.  B/p is doing okay.  We reviewed all her meds and organized them into a med calendar with pt education  Stopped pepcid . Discussed restarting.  No chest pain, orthopnea, edema or fever.  Workup has shown : NML  PFT w/ no sign BD response . 05/2014  nml cXR 03/2014  Using Dulera As needed  , discussed proair as rescue.  rec Follow med calendar closely and bring to each visit .  We are setting you up for labs and CT sinus> neg  Stop use Dulera .  ProAir and Albuterol are your rescue inhalers to be used if needed.  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 Cough    Reports increased wheezing depending on the weather.  wakes up each day around 7 am feeling ok but when walks across the room  Starts wheezing > cough  Started back dulera 200 but not using correctly (see a/p) and down to zero but didn't know it was empty  Says has flutter valve not clear she's using it  rec dulera 100 2bid  Follow med calendar    12/29/2014 f/u ov/Wert re: recurrent cough / no med calendar/ no meds  Chief Complaint  Patient presents with  . Follow-up    Pt states that her cough and SOB are unchanged. She is only using Dulera prn "not that often".   not following any of the instructions we gave her and very negative overall about her care except for stating always gets better p steroid rx  rec Start symbicort 80 Take 2 puffs first thing in am and then another 2 puffs about 12 hours  later.     03/24/2015 f/u ov/Wert re: recurrent cough/ no med calendar  Chief Complaint  Patient presents with  . Follow-up    Pt states that her breathing is doing well.    Not limited by breathing from desired activities  /  coughing needs norco in am and around 4pm  >>add singulair   06/23/2015 Follow up : Recurrent cough  Pt returns for 3 months follow up .  Last ov Singulair was added to her regimen.  Feeling better. Feels singulair is helping.  Cough is decreased. She denies chest pain, orthopnea, or edema.   reviewed all her medications organize them into a medication count with patient education.  To be taking correctly  Current Medications, Allergies, Complete Past Medical History, Past Surgical History, Family History, and Social History were reviewed in [employer] record.  ROS  The following are not active complaints unless bolded sore throat, dysphagia, dental problems, itching, sneezing,  nasal congestion or excess/ purulent secretions, ear ache,   fever, chills, sweats, unintended wt loss, pleuritic or exertional cp, hemoptysis,  orthopnea pnd or leg swelling, presyncope, palpitations, heartburn, abdominal pain, anorexia, nausea, vomiting, diarrhea  or change in bowel or urinary habits, change in stools or urine, dysuria,hematuria,  rash, arthralgias, visual complaints, headache, numbness weakness or ataxia or problems with walking or coordination,  change in mood/affect or memory.                  Objective:   Physical Exam  amb obese bf nad     12/29/14           209 > 03/24/2015    207  >208 06/23/2015     Vital signs reviewed    HEENT: nl dentition, turbinates, and orophanx. Nl external ear canals without cough reflex   NECK :  without JVD/Nodes/TM/ nl carotid upstrokes bilaterally   LUNGS: no acc muscle use,   Lungs clear bilaterally    CV:  RRR  no s3 or murmur or increase in P2, no edema   ABD:  soft and nontender with nl  excursion in the supine position. No bruits or organomegaly, bowel sounds nl  MS:  warm without deformities, calf tenderness, cyanosis or clubbing  SKIN: warm and dry without lesions    NEURO:  alert, approp, no deficits              Assessment & Plan:

## 2015-06-23 NOTE — Patient Instructions (Signed)
Follow med calendar closely and bring to each visit.  Follow up Dr. Wert  In 3 months and As needed   Please contact office for sooner follow up if symptoms do not improve or worsen or seek emergency care   

## 2015-06-23 NOTE — Addendum Note (Signed)
Addended by: Osa Craver on: 06/23/2015 12:42 PM   Modules accepted: Orders

## 2015-06-23 NOTE — Assessment & Plan Note (Signed)
Improved on current regimen  Patient's medications were reviewed today and patient education was given. Computerized medication calendar was adjusted/completed  Plan  Follow med calendar closely and bring to each visit .  Follow up Dr. Melvyn Novas  In 3 months and As needed   Please contact office for sooner follow up if symptoms do not improve or worsen or seek emergency care

## 2015-06-23 NOTE — Assessment & Plan Note (Signed)
Improved on current regimen 

## 2015-06-23 NOTE — Addendum Note (Signed)
Addended by: Osa Craver on: 06/23/2015 01:21 PM   Modules accepted: Orders, Medications

## 2015-06-24 NOTE — Progress Notes (Signed)
Chart and office note reviewed in detail  > agree with a/p as outlined    

## 2015-07-05 DIAGNOSIS — F410 Panic disorder [episodic paroxysmal anxiety] without agoraphobia: Secondary | ICD-10-CM | POA: Diagnosis not present

## 2015-07-20 ENCOUNTER — Telehealth: Payer: Self-pay | Admitting: Internal Medicine

## 2015-07-20 MED ORDER — HYDROCODONE-ACETAMINOPHEN 5-325 MG PO TABS: 1.0000 | ORAL_TABLET | ORAL | Status: DC | PRN

## 2015-07-20 NOTE — Telephone Encounter (Signed)
Ok but let her know I need to see her before next refill because my understanding was she didn't need it anymore for the cough  - tell her When return bring your medications in 2 separate bags, the ones you take no matter(automatically)  what vs the as needed (only when you feel you need them)

## 2015-07-20 NOTE — Telephone Encounter (Signed)
[place] x 1 - RX left at front desk for pick up

## 2015-07-20 NOTE — Telephone Encounter (Signed)
Patient called back.  I advised her the RX was left up front for her but that she needed to make an appointment and bring her meds with her as instructed by Dr. Melvyn Novas.  Patient said she will call back tomorrow to make the appointment.

## 2015-07-20 NOTE — Telephone Encounter (Signed)
Spoke with pt, requesting refill on hydrocodone-states she will pick up this rx in office. Last refill hydrocodone 5/325 #40 with 0 refills on 05/31/15- take 1-2 tabs q4h prn cough  MW please advise on refill.  Thanks!

## 2015-07-28 DIAGNOSIS — F410 Panic disorder [episodic paroxysmal anxiety] without agoraphobia: Secondary | ICD-10-CM | POA: Diagnosis not present

## 2015-08-24 ENCOUNTER — Other Ambulatory Visit: Payer: Self-pay | Admitting: Internal Medicine

## 2015-09-19 ENCOUNTER — Telehealth: Payer: Self-pay | Admitting: Internal Medicine

## 2015-09-19 ENCOUNTER — Ambulatory Visit (INDEPENDENT_AMBULATORY_CARE_PROVIDER_SITE_OTHER): Payer: Medicare Other | Admitting: Acute Care

## 2015-09-19 ENCOUNTER — Encounter: Payer: Self-pay | Admitting: Acute Care

## 2015-09-19 VITALS — BP 152/88 | HR 101 | Ht 66.0 in | Wt 203.8 lb

## 2015-09-19 DIAGNOSIS — J4520 Mild intermittent asthma, uncomplicated: Secondary | ICD-10-CM

## 2015-09-19 MED ORDER — HYDROCODONE-ACETAMINOPHEN 5-325 MG PO TABS: 1.0000 | ORAL_TABLET | ORAL | Status: DC | PRN

## 2015-09-19 NOTE — Telephone Encounter (Signed)
Patient stopped in the office requesting work note.  Patient also requested a disability parking placard to be completed.  Patient scheduled to see Eric Form, NP today at 3:30pm.  Patient aware of appointment. Nothing further needed.

## 2015-09-19 NOTE — Progress Notes (Signed)
Subjective:    Patient ID: Gloria [name], female    DOB: 1950-07-21, 65 y.o.   MRN: [id number]   Brief patient profile:   77 yobf never smoker sickly child (? Pna, whooping cough, asthma) never full aerobic tolerance then better ages 64- 62 and no daily rx or need to go doctor and self rx with otcs like nyquil then much Worse since 2004-10-18 when mother died and since then freq prns including albuterol neb worse since 19-Oct-2011 and needing daily dulera 200 (though admits not consistent with it) referred 08/02/2013 by Dr Basilio Cairo for chronic variable cough worse since Jan 20168    HPI  65 year old female Patient seen by Dr. Melvyn Novas, never smoker ,with asthma, who is currently controlled on Symbicort, and Singulair (questionable compliance with these medications), with Plan B of Proventil HFA and plan B of Proventil neb solution. Additionally she is treated for GERD with Prilosec and Pepcid as maintenance, and hydrocodone for cough.  09/19/2015 acute office visit: Patient presents to the office today stating she has taken the week off work and requesting a physician's note. She is also requesting a handicap placard and a refill for her cough medication. She states she has had some shortness of breath and coughing. Intermittently coughing up some brown sputum. She states earlier in the week she did have some vomiting and diarrhea. She denies chest pain ,fever, shortness of breath, orthopnea hemoptysis or wheezing. She states that she is feeling better but feels she needs this week to rest prior to going back to work. Patient states she is taking her blood pressure medication as prescribed. Blood pressure was slightly elevated in the office today, however patient states is because she is anxious.   Current outpatient prescriptions:  .  albuterol (PROAIR HFA) 108 (90 BASE) MCG/ACT inhaler, Inhale 2 puffs into the lungs every 4 (four) hours as needed for wheezing (((PLAN B)))., Disp: , Rfl:  .  albuterol (PROVENTIL)  (2.5 MG/3ML) 0.083% nebulizer solution, Inhale 3 mLs (2.5 mg total) into the lungs every 4 (four) hours as needed for wheezing or shortness of breath (((PLAN C)))., Disp: 75 mL, Rfl: 2 .  ALPRAZolam (XANAX) 1 MG tablet, Take 1 mg by mouth 2 (two) times daily as needed for anxiety. , Disp: , Rfl:  .  budesonide-formoterol (SYMBICORT) 160-4.5 MCG/ACT inhaler, Inhale 2 puffs into the lungs 2 (two) times daily., Disp: , Rfl:  .  Cholecalciferol (VITAMIN D3) 5000 UNITS CAPS, Take 1 capsule by mouth every morning., Disp: , Rfl:  .  dextromethorphan-guaiFENesin (MUCINEX DM) 30-600 MG per 12 hr tablet, Take 1 tablet by mouth 2 (two) times daily as needed (w/ flutter valve for cough and congestion). , Disp: , Rfl:  .  famotidine (PEPCID) 20 MG tablet, Take 20 mg by mouth at bedtime., Disp: , Rfl:  .  fexofenadine (ALLEGRA) 180 MG tablet, Take 1 tablet (180 mg total) by mouth daily. (Patient taking differently: Take 180 mg by mouth daily as needed (drainage). ), Disp: 30 tablet, Rfl: 5 .  fluticasone (FLONASE) 50 MCG/ACT nasal spray, Place 2 sprays into both nostrils daily as needed for allergies or rhinitis. (Patient taking differently: Place 2 sprays into both nostrils daily as needed (nasal congestion). ), Disp: 1 g, Rfl: 11 .  glucose blood (ACCU-CHEK AVIVA) test strip, Use as instructed, Disp: 100 each, Rfl: 12 .  hydrochlorothiazide (HYDRODIURIL) 50 MG tablet, Take 1 tablet (50 mg total) by mouth daily. (Patient taking differently: Take 50 mg  by mouth every morning. ), Disp: 30 tablet, Rfl: 11 .  HYDROcodone-acetaminophen (NORCO/VICODIN) 5-325 MG tablet, Take 1-2 tablets by mouth every 4 (four) hours as needed (if still coughing)., Disp: 40 tablet, Rfl: 0 .  irbesartan (AVAPRO) 150 MG tablet, Take 1 tablet (150 mg total) by mouth daily. (Patient taking differently: Take 150 mg by mouth every morning. ), Disp: 30 tablet, Rfl: 11 .  montelukast (SINGULAIR) 10 MG tablet, Take 1 tablet (10 mg total) by mouth at  bedtime., Disp: 30 tablet, Rfl: 11 .  Multiple Vitamins-Minerals (MULTIVITAMIN WITH MINERALS) tablet, Take 1 tablet by mouth every morning., Disp: , Rfl:  .  omeprazole (PRILOSEC) 40 MG capsule, TAKE 1 CAPSULE BY MOUTH DAILY, Disp: 30 capsule, Rfl: 0 .  ondansetron (ZOFRAN) 4 MG tablet, Take 4 mg by mouth every 8 (eight) hours as needed for nausea or vomiting., Disp: , Rfl:  .  potassium chloride (K-DUR) 10 MEQ tablet, Take 1 tablet (10 mEq total) by mouth daily. (Patient taking differently: Take 10 mEq by mouth every morning. ), Disp: 90 tablet, Rfl: 3 .  Respiratory Therapy Supplies (FLUTTER) DEVI, Use as directed, Disp: 1 each, Rfl: 0   Past Medical History  Diagnosis Date  . Depressive disorder, not elsewhere classified   . Other and unspecified hyperlipidemia   . Unspecified essential hypertension   . Migraine, unspecified, without mention of intractable migraine without mention of status migrainosus   . Unspecified urinary incontinence   . Arthritis   . Chronic bronchitis   . Anxiety state, unspecified   . Postmenopausal symptoms   . Panic attacks   . Allergic rhinitis, cause unspecified 03/30/2013  . Borderline diabetes mellitus     a1c 7.0 (07/26/13)     Allergies  Allergen Reactions  . Compazine Other (See Comments)    Stroke like symptoms  . Haloperidol Decanoate Other (See Comments)    Extrapyramidal syndrome  . Penicillins Nausea And Vomiting  . Codeine Itching  . Lisinopril Cough    Review of Systems Constitutional:   No  weight loss, night sweats,  Fevers, chills, fatigue, or  lassitude.  HEENT:   No headaches,  Difficulty swallowing,  Tooth/dental problems, or  Sore throat,                No sneezing, itching, ear ache, nasal congestion, post nasal drip,   CV:  No chest pain,  Orthopnea, PND, swelling in lower extremities, anasarca, dizziness, palpitations, syncope.   GI  No heartburn, indigestion, abdominal pain, nausea, +vomiting, +diarrhea, no change in  bowel habits, loss of appetite, bloody stools.   Resp: + shortness of breath with exertion not at rest.  + excess mucus, + productive cough,  No non-productive cough,  No coughing up of blood.  No change in color of mucus.   wheezing.  No chest wall deformity  Skin: no rash or lesions.  GU: no dysuria, change in color of urine, no urgency or frequency.  No flank pain, no hematuria   MS:  No joint pain or swelling.  No decreased range of motion.  No back pain.  Psych:  No change in mood or affect. No depression or anxiety.  No memory loss.        Objective:   Physical Exam BP 152/88 mmHg  Pulse 101  Ht 5\' 6"  (1.676 m)  Wt 203 lb 12.8 oz (92.443 kg)  BMI 32.91 kg/m2  SpO2 92%  Physical Exam:  General- No distress,  A&Ox3 ENT:  No sinus tenderness, TM clear, pale nasal mucosa, no oral exudate,no post nasal drip, no LAN Cardiac: S1, S2, regular rate and rhythm, no murmur Chest: No wheeze/ rales/ dullness; no accessory muscle use, no nasal flaring, no sternal retractions Abd.: Soft Non-tender Ext: No clubbing cyanosis, edema Neuro:  normal strength Skin: No rashes, warm and dry Psych: normal mood and behavior  Magdalen Spatz, AGACNP-BC [place] Pager # [phone] 09/19/2015    Assessment & Plan:

## 2015-09-19 NOTE — Assessment & Plan Note (Signed)
Patient presents to the office stating she has had an asthma exacerbation and requesting a note for work for a week of absence starting Monday through this Friday 09/22/2015. Normal physical exam, no wheezing or shortness of breath. Agree with rest for 2 days and return to work on Friday, 09/22/2015.  Plan: Return to work on Friday. We will give you a note. Continue your Symbicort 2 puffs in the morning and 2 puffs in the evening. Add your Singulair one 10 mg tablet at night as directed. Use your flonase 2 sprays daily as needed for allergies or runny nose. We will renew your handicap placard for 1 year. We will refill your hydrocodone #40 tablets for your cough, no refills. Mucinex 2 tablets every 12 hours to help expectorate thick mucus. Drink plenty of water with the Mucinex. 6 month follow up with Dr. Melvyn Novas Follow up with Dr. Jenny Reichmann regarding your diabetes. Please contact office for sooner follow up if symptoms do not improve or worsen or seek emergency care

## 2015-09-19 NOTE — Patient Instructions (Addendum)
Return to work on Friday. We will give you a note. Continue your Symbicort 2 puffs in the morning and 2 puffs in the evening. Add your Singulair one 10 mg tablet at night as directed. Use your flonase 2 sprays daily as needed for allergies or runny nose. We will renew your handicap placard for 1 year. We will refill your hydrocodone #40 tablets for your cough, no refills. Mucinex 2 tablets every 12 hours to help expectorate thick mucus. Drink plenty of water with the Mucinex. 6 month follow up with Gloria [name] Follow up with Gloria [name] regarding your diabetes. Please contact office for sooner follow up if symptoms do not improve or worsen or seek emergency care

## 2015-09-19 NOTE — Telephone Encounter (Signed)
Spoke with pt and she states that she has been out of work all week and needs a dr's note to go back to work. Pt has been out of work all week and is not able to return until this coming Monday and cannot return without a dr's clearance note. Pt c/o cough with brown mucus, vomiting, diarrhea, fever that stopped Sunday. All symptoms resolved except cough. Pt denies wheeze/SOB/CP/tightness. Pt has used albuterol HFA and albuterol nebs with symptom relief. Pt has also taken Mucinex-D. Pt appt scheduled for 09/21/15 @ 9am with CY. Pt aware.

## 2015-09-20 NOTE — Progress Notes (Signed)
Chart and office note reviewed in detail  > agree with a/p as outlined    

## 2015-09-21 ENCOUNTER — Ambulatory Visit: Payer: Medicare Other | Admitting: Internal Medicine

## 2015-10-03 ENCOUNTER — Encounter: Payer: Self-pay | Admitting: Internal Medicine

## 2015-10-03 ENCOUNTER — Other Ambulatory Visit (INDEPENDENT_AMBULATORY_CARE_PROVIDER_SITE_OTHER): Payer: Medicare Other

## 2015-10-03 ENCOUNTER — Ambulatory Visit (INDEPENDENT_AMBULATORY_CARE_PROVIDER_SITE_OTHER): Payer: Medicare Other | Admitting: Internal Medicine

## 2015-10-03 VITALS — BP 140/78 | HR 92 | Temp 98.2°F | Resp 20 | Wt 214.0 lb

## 2015-10-03 DIAGNOSIS — Z0189 Encounter for other specified special examinations: Secondary | ICD-10-CM

## 2015-10-03 DIAGNOSIS — R7989 Other specified abnormal findings of blood chemistry: Secondary | ICD-10-CM | POA: Diagnosis not present

## 2015-10-03 DIAGNOSIS — E1140 Type 2 diabetes mellitus with diabetic neuropathy, unspecified: Secondary | ICD-10-CM

## 2015-10-03 DIAGNOSIS — Z1159 Encounter for screening for other viral diseases: Secondary | ICD-10-CM | POA: Diagnosis not present

## 2015-10-03 DIAGNOSIS — R609 Edema, unspecified: Secondary | ICD-10-CM

## 2015-10-03 DIAGNOSIS — E1165 Type 2 diabetes mellitus with hyperglycemia: Secondary | ICD-10-CM | POA: Diagnosis not present

## 2015-10-03 DIAGNOSIS — Z0001 Encounter for general adult medical examination with abnormal findings: Secondary | ICD-10-CM | POA: Diagnosis not present

## 2015-10-03 DIAGNOSIS — IMO0002 Reserved for concepts with insufficient information to code with codable children: Secondary | ICD-10-CM

## 2015-10-03 DIAGNOSIS — I10 Essential (primary) hypertension: Secondary | ICD-10-CM

## 2015-10-03 DIAGNOSIS — Z0000 Encounter for general adult medical examination without abnormal findings: Secondary | ICD-10-CM

## 2015-10-03 DIAGNOSIS — R6889 Other general symptoms and signs: Secondary | ICD-10-CM

## 2015-10-03 DIAGNOSIS — R600 Localized edema: Secondary | ICD-10-CM

## 2015-10-03 LAB — URINALYSIS, ROUTINE W REFLEX MICROSCOPIC
Bilirubin Urine: NEGATIVE
Ketones, ur: NEGATIVE
Nitrite: NEGATIVE
Specific Gravity, Urine: 1.01 (ref 1.000–1.030)
Total Protein, Urine: NEGATIVE
Urine Glucose: NEGATIVE
Urobilinogen, UA: 0.2 (ref 0.0–1.0)
pH: 6.5 (ref 5.0–8.0)

## 2015-10-03 LAB — CBC WITH DIFFERENTIAL/PLATELET
Basophils Absolute: 0.1 10*3/uL (ref 0.0–0.1)
Basophils Relative: 0.5 % (ref 0.0–3.0)
Eosinophils Absolute: 0.3 10*3/uL (ref 0.0–0.7)
Eosinophils Relative: 2.7 % (ref 0.0–5.0)
HCT: 34 % — ABNORMAL LOW (ref 36.0–46.0)
Hemoglobin: 11.1 g/dL — ABNORMAL LOW (ref 12.0–15.0)
Lymphocytes Relative: 17.7 % (ref 12.0–46.0)
Lymphs Abs: 2.1 10*3/uL (ref 0.7–4.0)
MCHC: 32.7 g/dL (ref 30.0–36.0)
MCV: 84.7 fl (ref 78.0–100.0)
Monocytes Absolute: 0.5 10*3/uL (ref 0.1–1.0)
Monocytes Relative: 4.1 % (ref 3.0–12.0)
Neutro Abs: 8.9 10*3/uL — ABNORMAL HIGH (ref 1.4–7.7)
Neutrophils Relative %: 75 % (ref 43.0–77.0)
Platelets: 364 10*3/uL (ref 150.0–400.0)
RBC: 4.01 Mil/uL (ref 3.87–5.11)
RDW: 15.6 % — ABNORMAL HIGH (ref 11.5–15.5)
WBC: 11.8 10*3/uL — ABNORMAL HIGH (ref 4.0–10.5)

## 2015-10-03 LAB — TSH: TSH: 2.04 u[IU]/mL (ref 0.35–4.50)

## 2015-10-03 LAB — MICROALBUMIN / CREATININE URINE RATIO
Creatinine,U: 73.3 mg/dL
Microalb Creat Ratio: 1 mg/g (ref 0.0–30.0)
Microalb, Ur: <0.7 mg/dL (ref 0.0–1.9)

## 2015-10-03 LAB — HEMOGLOBIN A1C: Hgb A1c MFr Bld: 6.9 % — ABNORMAL HIGH (ref 4.6–6.5)

## 2015-10-03 MED ORDER — FUROSEMIDE 20 MG PO TABS: 20.0000 mg | ORAL_TABLET | Freq: Every day | ORAL | Status: DC

## 2015-10-03 MED ORDER — GABAPENTIN 100 MG PO CAPS: 100.0000 mg | ORAL_CAPSULE | Freq: Three times a day (TID) | ORAL | Status: DC

## 2015-10-03 NOTE — Progress Notes (Signed)
Pre visit review using our clinic review tool, if applicable. No additional management support is needed unless otherwise documented below in the visit note. 

## 2015-10-03 NOTE — Patient Instructions (Addendum)
Ok to stop the HCT fluid pill  Please take all new medication as prescribed - the lasix 20 mg per day, and the gabapentin 100 mg three times per day  Please continue all other medications as before, and refills have been done if requested.  Please have the pharmacy call with any other refills you may need.  Please continue your efforts at being more active, low cholesterol diet, and weight control.  You are otherwise up to date with prevention measures today.  Please keep your appointments with your specialists as you may have planned  You will be contacted regarding the referral for: Echocardiogram  Please go to the LAB in the Basement (turn left off the elevator) for the tests to be done today  You will be contacted by phone if any changes need to be made immediately.  Otherwise, you will receive a letter about your results with an explanation, but please check with MyChart first.  Please remember to sign up for MyChart if you have not done so, as this will be important to you in the future with finding out test results, communicating by private email, and scheduling acute appointments online when needed.  Please return in 6 months, or sooner if needed, with Lab testing done 3-5 days before

## 2015-10-03 NOTE — Progress Notes (Signed)
Subjective:    Patient ID: Gloria [name], female    DOB: Dec 16, 1950, 65 y.o.   MRN: [id number]  HPI  Here for wellness and f/u;  Overall doing ok;  Pt denies Chest pain, worsening SOB, DOE, wheezing, orthopnea, PND,  palpitations, dizziness or syncope.  Pt denies neurological change such as new headache, facial or extremity weakness.  Pt denies polydipsia, polyuria, or low sugar symptoms. Pt states overall good compliance with treatment and medications, good tolerability, and has been trying to follow appropriate diet.  Pt denies worsening depressive symptoms, suicidal ideation or panic. No fever, night sweats, wt loss, loss of appetite, or other constitutional symptoms.  Pt states good ability with ADL's, has low fall risk, home safety reviewed and adequate, no other significant changes in hearing or vision, and only occasionally active with exercise. Also with here with 1 wk new onset bialt LE swelling left > right, also suspicious of enlarging abd girth., despite taking the hct 50 mg, and wt gain.  Also with ongoing not necessarily worse but persistent burning pain to the LE's she attributes to neuropathic pain which was on going prior to onset swelling, asks for tx as she has seen ads on TV.    Wt Readings from Last 3 Encounters:  10/03/15 214 lb (97.07 kg)  09/19/15 203 lb 12.8 oz (92.443 kg)  06/23/15 208 lb (94.348 kg)  Took herself off the prilosec, Denies worsening reflux, abd pain, dysphagia, n/v, bowel change or blood.  Still sees psychiatry, has cut back on xanax but had a panic attack recently. Has plan to f/u soon. Declines ecg or cxr, ok for echo but not today. Denies urinary symptoms such as dysuria, frequency, urgency, flank pain, hematuria or n/v, fever, chills.  Denies forced fluids.  Past Medical History  Diagnosis Date  . Depressive disorder, not elsewhere classified   . Other and unspecified hyperlipidemia   . Unspecified essential hypertension   . Migraine, unspecified, without  mention of intractable migraine without mention of status migrainosus   . Unspecified urinary incontinence   . Arthritis   . Chronic bronchitis   . Anxiety state, unspecified   . Postmenopausal symptoms   . Panic attacks   . Allergic rhinitis, cause unspecified 03/30/2013  . Borderline diabetes mellitus     a1c 7.0 (07/26/13)    Past Surgical History  Procedure Laterality Date  . Abdominal hysterectomy  2002  . Dislocated shoulder      right  . Diagnostic laparoscopy    . Tumor right ankle      reports that she has never smoked. She has never used smokeless tobacco. She reports that she does not drink alcohol or use illicit drugs. family history includes Asthma in her son; Colon cancer in her maternal grandfather. Allergies  Allergen Reactions  . Compazine Other (See Comments)    Stroke like symptoms  . Haloperidol Decanoate Other (See Comments)    Extrapyramidal syndrome  . Penicillins Nausea And Vomiting  . Codeine Itching  . Lisinopril Cough   Review of Systems Constitutional: Negative for increased diaphoresis, or other activity, appetite or siginficant weight change other than noted HENT: Negative for worsening hearing loss, ear pain, facial swelling, mouth sores and neck stiffness.   Eyes: Negative for other worsening pain, redness or visual disturbance.  Respiratory: Negative for choking or stridor Cardiovascular: Negative for other chest pain and palpitations.  Gastrointestinal: Negative for worsening diarrhea, blood in stool, or abdominal distention Genitourinary: Negative for hematuria, flank pain  or change in urine volume.  Musculoskeletal: Negative for myalgias or other joint complaints.  Skin: Negative for other color change and wound or drainage.  Neurological: Negative for syncope and numbness. other than noted Hematological: Negative for adenopathy. or other swelling Psychiatric/Behavioral: Negative for hallucinations, SI, self-injury, decreased concentration  or other worsening agitation.      Objective:   Physical Exam BP 140/78 mmHg  Pulse 92  Temp(Src) 98.2 F (36.8 C) (Oral)  Resp 20  Wt 214 lb (97.07 kg)  SpO2 90% VS noted, non toxic Constitutional: Pt is oriented to person, place, and time. Appears well-developed and well-nourished, in no significant distress Head: Normocephalic and atraumatic  Eyes: Conjunctivae and EOM are normal. Pupils are equal, round, and reactive to light Right Ear: External ear normal.  Left Ear: External ear normal Nose: Nose normal.  Mouth/Throat: Oropharynx is clear and moist  Neck: Normal range of motion. Neck supple. No JVD present. No tracheal deviation present or significant neck LA or mass Cardiovascular: Normal rate, regular rhythm, normal heart sounds and intact distal pulses.   Pulmonary/Chest: Effort normal and breath sounds without rales or wheezing  Abdominal: Soft. Bowel sounds are normal. NT. No HSM  Musculoskeletal: Normal range of motion. Exhibits n2+ edema bilat to knees Lymphadenopathy: Has no cervical adenopathy.  Neurological: Pt is alert and oriented to person, place, and time. Pt has normal reflexes. No cranial nerve deficit. Motor grossly intact, sens decreased to bilat LEs to LT Skin: Skin is warm and dry. No rash noted or new ulcers Psychiatric:  Has 1-2+ nervous mood and affect. Behavior is normal.     Assessment & Plan:

## 2015-10-04 ENCOUNTER — Encounter: Payer: Self-pay | Admitting: Internal Medicine

## 2015-10-04 LAB — LIPID PANEL
Cholesterol: 140 mg/dL (ref 0–200)
HDL: 37.5 mg/dL — ABNORMAL LOW (ref >39.00)
NonHDL: 102.55
Total CHOL/HDL Ratio: 4
Triglycerides: 256 mg/dL — ABNORMAL HIGH (ref 0.0–149.0)
VLDL: 51.2 mg/dL — ABNORMAL HIGH (ref 0.0–40.0)

## 2015-10-04 LAB — BASIC METABOLIC PANEL
BUN: 14 mg/dL (ref 6–23)
CO2: 29 mEq/L (ref 19–32)
Calcium: 9.7 mg/dL (ref 8.4–10.5)
Chloride: 99 mEq/L (ref 96–112)
Creatinine, Ser: 0.76 mg/dL (ref 0.40–1.20)
GFR: 98.14 mL/min (ref >60.00)
Glucose, Bld: 103 mg/dL — ABNORMAL HIGH (ref 70–99)
Potassium: 3.7 mEq/L (ref 3.5–5.1)
Sodium: 137 mEq/L (ref 135–145)

## 2015-10-04 LAB — HEPATITIS C ANTIBODY: HCV Ab: NEGATIVE

## 2015-10-04 LAB — HEPATIC FUNCTION PANEL
ALT: 14 U/L (ref 0–35)
AST: 14 U/L (ref 0–37)
Albumin: 4 g/dL (ref 3.5–5.2)
Alkaline Phosphatase: 90 U/L (ref 39–117)
Bilirubin, Direct: 0 mg/dL (ref 0.0–0.3)
Total Bilirubin: 0.4 mg/dL (ref 0.2–1.2)
Total Protein: 8.3 g/dL (ref 6.0–8.3)

## 2015-10-04 LAB — LDL CHOLESTEROL, DIRECT: Direct LDL: 64 mg/dL

## 2015-10-06 NOTE — Assessment & Plan Note (Signed)
stable overall by history and exam, recent data reviewed with pt, and pt to continue medical treatment as before,  to f/u any worsening symptoms or concerns Lab Results  Component Value Date   HGBA1C 6.9* 10/03/2015

## 2015-10-06 NOTE — Assessment & Plan Note (Addendum)
Etiology unclear, cant r/o CHF - for d/c hct, start lasix 40, check daily wts, echocardiogram, f/u 2 wks for f/u labs to include bmp  In addition to the time spent performing CPE, I spent an additional 40 minutes face to face,in which greater than 50% of this time was spent in counseling and coordination of care for patient's acute illness as documented.

## 2015-10-06 NOTE — Assessment & Plan Note (Signed)
stable overall by history and exam, recent data reviewed with pt, and pt to continue medical treatment as before,  to f/u any worsening symptoms or concerns BP Readings from Last 3 Encounters:  10/03/15 140/78  09/19/15 152/88  06/23/15 136/74

## 2015-10-06 NOTE — Assessment & Plan Note (Signed)

## 2015-10-17 DIAGNOSIS — F410 Panic disorder [episodic paroxysmal anxiety] without agoraphobia: Secondary | ICD-10-CM | POA: Diagnosis not present

## 2015-10-19 ENCOUNTER — Other Ambulatory Visit: Payer: Self-pay | Admitting: Internal Medicine

## 2015-11-02 ENCOUNTER — Telehealth: Payer: Self-pay | Admitting: Internal Medicine

## 2015-11-02 MED ORDER — HYDROCODONE-ACETAMINOPHEN 5-325 MG PO TABS: 1.0000 | ORAL_TABLET | ORAL | Status: DC | PRN

## 2015-11-02 NOTE — Telephone Encounter (Signed)
Spoke with pt, aware of recs.  rx printed and given to MW for signature, placed up front for pickup.  Nothing further needed.

## 2015-11-02 NOTE — Telephone Encounter (Signed)
Spoke with pt, requesting refill on hydrocodone 5/325mg -states she uses this to suppress her cough.   Last fill 09/25/15 #40 with 0 refills.  Pt would like to pick up refill from office.   MW please advise on refill.  Thanks!    (AVS from 09/25/15 visit with SG-last ov with pt)  Patient Instructions       Return to work on Friday. We will give you a note. Continue your Symbicort 2 puffs in the morning and 2 puffs in the evening. Add your Singulair one 10 mg tablet at night as directed. Use your flonase 2 sprays daily as needed for allergies or runny nose. We will renew your handicap placard for 1 year. We will refill your hydrocodone #40 tablets for your cough, no refills. Mucinex 2 tablets every 12 hours to help expectorate thick mucus. Drink plenty of water with the Mucinex. 6 month follow up with Dr. Melvyn Novas Follow up with Dr. Jenny Reichmann regarding your diabetes. Please contact office for sooner follow up if symptoms do not improve or worsen or seek emergency care

## 2015-11-02 NOTE — Telephone Encounter (Signed)
Ok x one but need f/u ov with all meds in hand before any more refills for narcotics to regroup as this covers up the problem instead of solving it

## 2015-11-06 ENCOUNTER — Other Ambulatory Visit (HOSPITAL_COMMUNITY): Payer: Medicare Other

## 2015-11-14 ENCOUNTER — Encounter: Payer: Self-pay | Admitting: Internal Medicine

## 2015-12-04 DIAGNOSIS — H35033 Hypertensive retinopathy, bilateral: Secondary | ICD-10-CM | POA: Diagnosis not present

## 2015-12-04 DIAGNOSIS — H43813 Vitreous degeneration, bilateral: Secondary | ICD-10-CM | POA: Diagnosis not present

## 2015-12-04 DIAGNOSIS — H18413 Arcus senilis, bilateral: Secondary | ICD-10-CM | POA: Diagnosis not present

## 2015-12-04 DIAGNOSIS — H35372 Puckering of macula, left eye: Secondary | ICD-10-CM | POA: Diagnosis not present

## 2015-12-04 DIAGNOSIS — H353131 Nonexudative age-related macular degeneration, bilateral, early dry stage: Secondary | ICD-10-CM | POA: Diagnosis not present

## 2015-12-21 DIAGNOSIS — F410 Panic disorder [episodic paroxysmal anxiety] without agoraphobia: Secondary | ICD-10-CM | POA: Diagnosis not present

## 2015-12-25 ENCOUNTER — Encounter: Payer: Self-pay | Admitting: Internal Medicine

## 2015-12-28 ENCOUNTER — Telehealth: Payer: Self-pay | Admitting: Internal Medicine

## 2015-12-28 NOTE — Telephone Encounter (Signed)
Called spoke with pt. Informed her of MW's recs. Scheduled her for an ov with MW on 12/28/15. She voiced understanding and had no further questions.

## 2015-12-28 NOTE — Telephone Encounter (Signed)
No can do - narcotics require ov with all meds in hand to document she's compliant with other options and only using the narcs as a last resort

## 2015-12-28 NOTE — Telephone Encounter (Signed)
Spoke with pt and she states that she has increased SOB/wheeze and cough for several days. Pt c/o increasing chest tightness. Pt reports that she has been using Symbicort BID and albuterol prn with no symptom relief. Pt offered appt for today but does not want to come in. Pt states that she gets better when she takes Norco and is requesting refill of this.  MW Please advise. Thanks!   LOV  09/19/15 w/SG  Patient Instructions     Return to work on Friday. We will give you a note. Continue your Symbicort 2 puffs in the morning and 2 puffs in the evening. Add your Singulair one 10 mg tablet at night as directed. Use your flonase 2 sprays daily as needed for allergies or runny nose. We will renew your handicap placard for 1 year. We will refill your hydrocodone #40 tablets for your cough, no refills. Mucinex 2 tablets every 12 hours to help expectorate thick mucus. Drink plenty of water with the Mucinex. 6 month follow up with Dr. Melvyn Novas Follow up with Dr. Jenny Reichmann regarding your diabetes. Please contact office for sooner follow up if symptoms do not improve or worsen or seek emergency care

## 2015-12-29 ENCOUNTER — Encounter: Payer: Self-pay | Admitting: Internal Medicine

## 2015-12-29 ENCOUNTER — Ambulatory Visit (INDEPENDENT_AMBULATORY_CARE_PROVIDER_SITE_OTHER): Payer: Medicare Other | Admitting: Internal Medicine

## 2015-12-29 VITALS — BP 134/80 | HR 97 | Temp 98.5°F | Ht 67.0 in | Wt 191.0 lb

## 2015-12-29 DIAGNOSIS — J45991 Cough variant asthma: Secondary | ICD-10-CM

## 2015-12-29 DIAGNOSIS — R0600 Dyspnea, unspecified: Secondary | ICD-10-CM | POA: Diagnosis not present

## 2015-12-29 LAB — NITRIC OXIDE: Nitric Oxide: 54

## 2015-12-29 MED ORDER — METHYLPREDNISOLONE ACETATE 80 MG/ML IJ SUSP
120.0000 mg | Freq: Once | INTRAMUSCULAR | Status: AC
  Administered 2015-12-29: 120 mg via INTRAMUSCULAR

## 2015-12-29 MED ORDER — HYDROCODONE-ACETAMINOPHEN 5-325 MG PO TABS: 1.0000 | ORAL_TABLET | ORAL | Status: DC | PRN

## 2015-12-29 NOTE — Assessment & Plan Note (Signed)
-   12/29/14  p extensive coaching HFA effectiveness =    75% > rechallenge with symibcort 80 2bid  - Added singulair 03/24/2015  -med calendar 06/23/2015 > not using 12/29/2015  - NO 12/29/2015   53  - 12/29/2015  After extensive coaching HFA effectiveness =    75% > rec resume symbicort 80 2bid and ? Singulair(not clear she has the latter)    Severe flare ? Etiology. DDX of  difficult airways management almost all start with A and  include Adherence, Ace Inhibitors, Acid Reflux, Active Sinus Disease, Alpha 1 Antitripsin deficiency, Anxiety masquerading as Airways dz,  ABPA,  Allergy(esp in young), Aspiration (esp in elderly), Adverse effects of meds,  Active smokers, A bunch of PE's (a small clot burden can't cause this syndrome unless there is already severe underlying pulm or vascular dz with poor reserve) plus two Bs  = Bronchiectasis and Beta blocker use..and one C= CHF   Adherence is always the initial "prime suspect" and is a multilayered concern that requires a "trust but verify" approach in every patient - starting with knowing how to use medications, especially inhalers, correctly, keeping up with refills and understanding the fundamental difference between maintenance and prns vs those medications only taken for a very short course and then stopped and not refilled.  - - The proper method of use, as well as anticipated side effects, of a metered-dose inhaler are discussed and demonstrated to the patient. Improved effectiveness after extensive coaching during this visit to a level of approximately 75 % from a baseline of 50 %  > continue symbicort 80 2bid - trust but verify at each ov:  To keep things simple, I have asked the patient to first separate medicines that are perceived as maintenance, that is to be taken daily "no matter what", from those medicines that are taken on only on an as-needed basis and I have given the patient examples of both, and then return to see our NP to generate a   detailed  medication calendar which should be followed until the next physician sees the patient and updates it.    ? Acid (or non-acid) GERD > always difficult to exclude as up to 75% of pts in some series report no assoc GI/ Heartburn symptoms> rec max (24h)  acid suppression and diet restrictions/ reviewed and instructions given in writing.   ? Allergy/asthma suggested by NO though not all that high considering having flare so should do fine with Depomedrol 120 and symb 80 and continue singulair if she's really on it at all    ? Anxiety/ dx of exclusion > control cyclical cough with short course of norco only  I had an extended discussion with the patient reviewing all relevant studies completed to date and  lasting 25 minutes of a 40  minute acute  visit    Each maintenance medication was reviewed in detail including most importantly the difference between maintenance and prns and under what circumstances the prns are to be triggered using an action plan format that is not reflected in the computer generated alphabetically organized AVS.    Please see instructions for details which were reviewed in writing and the patient given a copy highlighting the part that I personally wrote and discussed at today's ov.

## 2015-12-29 NOTE — Patient Instructions (Addendum)
Work on inhaler technique:  relax and gently blow all the way out then take a nice smooth deep breath back in, triggering the inhaler at same time you start breathing in.  Hold for up to 5 seconds if you can. Blow out thru nose. Rinse and gargle with water when done  Try omeprazole  40mg   Take 30-60 min before first meal of the day and Pepcid ac (famotidine) 20 mg one @  bedtime until cough is completely gone for at least a week without the need for cough suppression  Continue singulair (montelukast) 10 mg each  Day as per med calendar   For cough > mucinex dm up to 1200 mg every 12 hours with the flutter as much as possible and if can't control the cough norco up to every 4 hours if needed but only 3-5 days  depomedrol 120 mg IM today   Please schedule a follow up office visit in 4 weeks, sooner if needed with Tammy  NP with all medications in hand including over the counters

## 2015-12-29 NOTE — Progress Notes (Signed)
Subjective:    Patient ID: Gloria [name], female    DOB: October 27, 1950    MRN: [id number]    Brief patient profile:  12 yobf never smoker sickly child (? Pna, whooping cough, asthma) never full aerobic tolerance then better ages 48- 73 and no daily rx or need to go doctor and self rx with otcs like nyquil then much  Worse since October 02, 2004 when mother died and since then freq prns including  albuterol neb worse since October 03, 2011 and needing daily dulera 200 (though admits not consistent with it) referred 08/02/2013 by Dr Basilio Cairo for chronic variable cough worse since Jan 2015    History of Present Illness  08/02/2013 1st [facility] office visit/ Wert cc min prod (always white mucus) cough every day esp in am x sev years better on prednisone but "gained too much wt" from baseline 180  - wakes up daily 3 am smothering / hacking > goes to [facility] and uses dulera and helps and props up head and goes back to bed and then wakes up s flare. Also neb maybe once a month. Worse with pollen exp x 2 years  rec Pepcid ac 20 mg one at bedtime until return. GERD diet  Dulera 200 2bid Work on hfa   09/08/2013 acute  ov/Wert re: asthma/ cough > sob Chief Complaint  Patient presents with  . Acute Visit    Pt c/o increased cough, SOB and wheezing x 4 days. Cough is prod with white sputum. The past few days she has been waking up SOB and having to use albuterol nebs.   no sob p nebs/ still violent cough though no excess mucus rec Depromedrol 120 mg today Nexium  40 mg   Take 30-60 min before first meal of the day and Pepcid 20 mg one bedtime until return to office - this is the best way to tell whether stomach acid is contributing to your problem.   Stop hyzar and use valsartan 160/ 25 one daily  For short of breath > nebulizer every 4 hours For cough > hydrocone cough syrup. Once cough better use dulera Take 2 puffs first thing in am and then another 2 puffs about 12 hours later.  Please schedule a follow up office visit in  4 weeks, sooner if needed with pfts on return > did not return, request switch docs    03/29/2014 f/u ov/Wert re: pseudoasthma Chief Complaint  Patient presents with  . Acute Visit    Pt c/o increased cough- prod with yellow to brown sputum.  She also wheezing and increased SOB. She is using nebulizer with albuterol at least 5 times per day.   she does report improving p depomedrol but only for a few days then gradually back to baseline cough and sob, not clear she followed any of the instructions given.  Mucus turned clear on levaquin then back to brown w/in a few days of stopping it.  rec Continue to use the albuterol up to every 4 hours as needed but the goal is to minimize need for this  We will treat you with levaquin 500mg  daily for 7 day Take delsym two tsp every 12 hours and supplement if needed with hydrocodone  up to 2 every 4 hours to suppress the urge to cough. Swallowing water or using ice chips/non mint and menthol containing candies (such as lifesavers or sugarless jolly ranchers) are also effective.  You should rest your voice and avoid activities that you know make you cough.Once  you have eliminated the cough for 3 straight days try reducing the hydrocodone first,  then the delsym as tolerated.   fexofenadine 180mg  daily Start fluticasone nasal spray, 2 sprays each nostril daily Start nexium 40mg  Take 30- 60 min before your first and last meals of the day  Prednisone 10 mg take  4 each am x 2 days,   2 each am x 2 days,  1 each am x 2 days and stop  Use the flutter valve as much as possible  GERD  Diet    09/12/2014 f/u ov/Wert re: sick since January 2015  Chief Complaint  Patient presents with  . Acute Visit    pt has had cough,wheezing and chest tightness for the past month, Pt says mucus is green/ to brownish in color.  says never really cough free x > one year, confused with maint vs prns >>omnicef x 10 d and   10/03/2014 NP  Follow up : Asthma vs psuedoasthma /chronic  cough  Pt returns for  2 week follow up and med review .  She is feeling better but cough is not completely better.  Flare of AEAB last ov ,  tx w/ omnicef x 10 d. Changed from hyzaar to avapro /hctz.  Says norco helps and wants refill, discussed the side effects and addiction problems with narcotics.  Advised will give limited refills in hopes to control cough by other means.  B/p is doing okay.  We reviewed all her meds and organized them into a med calendar with pt education  Stopped pepcid . Discussed restarting.  No chest pain, orthopnea, edema or fever.  Workup has shown : NML  PFT w/ no sign BD response . 05/2014  nml cXR 03/2014  Using Dulera As needed  , discussed proair as rescue.  rec Follow med calendar closely and bring to each visit .  We are setting you up for labs and CT sinus> neg  Stop use Dulera .  ProAir and Albuterol are your rescue inhalers to be used if needed.  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 Cough    Reports increased wheezing depending on the weather.  wakes up each day around 7 am feeling ok but when walks across the room  Starts wheezing > cough  Started back dulera 200 but not using correctly (see a/p) and down to zero but didn't know it was empty  Says has flutter valve not clear she's using it  rec dulera 100 2bid  Follow med calendar    12/29/2014 f/u ov/Wert re: recurrent cough / no med calendar/ no meds  Chief Complaint  Patient presents with  . Follow-up    Pt states that her cough and SOB are unchanged. She is only using Dulera prn "not that often".   not following any of the instructions we gave her and very negative overall about her care except for stating always gets better p steroid rx  rec Start symbicort 80 Take 2 puffs first thing in am and then another 2 puffs about 12 hours  later.     03/24/2015 f/u ov/Wert re: recurrent cough/ no med calendar  Chief Complaint  Patient presents with  . Follow-up    Pt states that her breathing is doing well.    Not limited by breathing from desired activities  /  coughing needs norco in am and around 4pm  rec Work on inhaler technique:   Add singulair (montelukast) 10 mg each pm See calendar for specific medication instructions    06/23/2015  NP Follow up : Recurrent cough  Pt returns for 3 months follow up .  Last ov Singulair was added to her regimen.  Feeling better. Feels singulair is helping.  Cough is decreased. She denies chest pain, orthopnea, or edema.   reviewed all her medications organize them into a medication calendar with patient education.  To be taking correctly rec No change/ follow med calendar   09/20/15 NP flare of cough  Continue your Symbicort 2 puffs in the morning and 2 puffs in the evening. Add your Singulair one 10 mg tablet at night as directed. Use your flonase 2 sprays daily as needed for allergies or runny nose. We will renew your handicap placard for 1 year. We will refill your hydrocodone #40 tablets for your cough, no refills. Mucinex 2 tablets every 12 hours to help expectorate thick mucus. Drink plenty of water with the Mucinex.   12/28/15 pc requesting norco for cough rec narcotics require ov with all meds in hand to document she's compliant with other options and only using the narcs as a last resort     12/29/2015 acute extended ov/Wert re: flare of cough since 12/24/15 - no calendar/ no singulair confused with meds/ instructions esp omeprazole/ poor hfa  Chief Complaint  Patient presents with  . Acute Visit    Pt c/o increased SOB, cough with yellow sputum and wheezing for the past wk.   was better ? On what maint rx ? Until 5 d prior to OV  With severe coughing fits and sob with minimal activity. Worse day > noct / no better with saba   No obvious day to day or  daytime variability or assoc  mucus plugs or hemoptysis or cp or chest tightness, subjective wheeze or overt sinus or hb symptoms. No unusual exp hx or h/o childhood pna/ asthma or knowledge of premature birth.  Sleeping ok without nocturnal  or early am exacerbation  of respiratory  c/o's or need for noct saba. Also denies any obvious fluctuation of symptoms with weather or environmental changes or other aggravating or alleviating factors except as outlined above   Current Medications, Allergies, Complete Past Medical History, Past Surgical History, Family History, and Social History were reviewed in [employer] record.  ROS  The following are not active complaints unless bolded sore throat, dysphagia, dental problems, itching, sneezing,  nasal congestion or excess/ purulent secretions, ear ache,   fever, chills, sweats, unintended wt loss, classically pleuritic or exertional cp,  orthopnea pnd or leg swelling, presyncope, palpitations, abdominal pain, anorexia, nausea, vomiting, diarrhea  or change in bowel or bladder habits, change in stools or urine, dysuria,hematuria,  rash, arthralgias, visual complaints, headache, numbness, weakness or ataxia or problems with walking or coordination,  change in mood/affect or memory.                       Objective:   Physical Exam  amb obese hoarse bf severe coughing fits     12/29/14  Wt 209 > 03/24/2015    207  >208 06/23/2015 > 12/29/2015  191     Vital signs reviewed    HEENT: nl dentition, turbinates, and orophanx. Nl external ear canals without cough reflex   NECK :  without JVD/Nodes/TM/ nl carotid  upstrokes bilaterally   LUNGS: no acc muscle use,   insp and exp rhonchi and pseudowheezing    CV:  RRR  no s3 or murmur or increase in P2, no edema   ABD:  soft and nontender with nl excursion in the supine position. No bruits or organomegaly, bowel sounds nl  MS:  warm without deformities, calf tenderness,  cyanosis or clubbing  SKIN: warm and dry without lesions    NEURO:  alert, approp, no deficits              Assessment & Plan:

## 2016-01-05 ENCOUNTER — Other Ambulatory Visit: Payer: Self-pay | Admitting: Internal Medicine

## 2016-01-16 ENCOUNTER — Other Ambulatory Visit (HOSPITAL_COMMUNITY): Payer: 59

## 2016-01-30 ENCOUNTER — Ambulatory Visit: Payer: 59 | Admitting: Adult Health

## 2016-02-05 ENCOUNTER — Encounter (HOSPITAL_COMMUNITY): Payer: Self-pay | Admitting: Emergency Medicine

## 2016-02-05 ENCOUNTER — Ambulatory Visit (HOSPITAL_COMMUNITY)
Admission: EM | Admit: 2016-02-05 | Discharge: 2016-02-05 | Disposition: A | Discharge status: Home / Self Care | Payer: Medicare Other | Attending: Emergency Medicine | Admitting: Emergency Medicine

## 2016-02-05 DIAGNOSIS — N390 Urinary tract infection, site not specified: Secondary | ICD-10-CM | POA: Diagnosis not present

## 2016-02-05 DIAGNOSIS — Z880 Allergy status to penicillin: Secondary | ICD-10-CM | POA: Insufficient documentation

## 2016-02-05 DIAGNOSIS — Z885 Allergy status to narcotic agent status: Secondary | ICD-10-CM | POA: Diagnosis not present

## 2016-02-05 DIAGNOSIS — Z888 Allergy status to other drugs, medicaments and biological substances status: Secondary | ICD-10-CM | POA: Diagnosis not present

## 2016-02-05 DIAGNOSIS — R109 Unspecified abdominal pain: Secondary | ICD-10-CM | POA: Diagnosis present

## 2016-02-05 DIAGNOSIS — Z79899 Other long term (current) drug therapy: Secondary | ICD-10-CM | POA: Insufficient documentation

## 2016-02-05 LAB — POCT URINALYSIS DIP (DEVICE)
Bilirubin Urine: NEGATIVE
Glucose, UA: NEGATIVE mg/dL
Ketones, ur: NEGATIVE mg/dL
Nitrite: POSITIVE — AB
Protein, ur: NEGATIVE mg/dL
Specific Gravity, Urine: <=1.005 (ref 1.005–1.030)
Urobilinogen, UA: 0.2 mg/dL (ref 0.0–1.0)
pH: 6 (ref 5.0–8.0)

## 2016-02-05 MED ORDER — CEPHALEXIN 500 MG PO CAPS: 500.0000 mg | ORAL_CAPSULE | Freq: Four times a day (QID) | ORAL | 0 refills | Status: DC

## 2016-02-05 MED ORDER — PHENAZOPYRIDINE HCL 200 MG PO TABS: 200.0000 mg | ORAL_TABLET | Freq: Three times a day (TID) | ORAL | 0 refills | Status: DC

## 2016-02-05 NOTE — Discharge Instructions (Signed)
You have a urinary tract infection. Take Keflex 4 times a day for 1 week. Use the Pyridium 3 times a day for the next 2 days. This will numb the bladder. It will also turned your urine orange. You should see improvement in 24-48 hours. Follow up as needed.

## 2016-02-05 NOTE — ED Triage Notes (Signed)
The patient presented to the [facility] with a complaint of right sided flank pain for 2 weeks that now has associated dysuria that started today.

## 2016-02-05 NOTE — ED Notes (Signed)
Patient states that she is unable to give enough urine at this time for the urine culture, she will return to clinic in about 1hr to give specimen.. Will send for culture at that time

## 2016-02-05 NOTE — ED Provider Notes (Signed)
[facility]    CSN: [id number] Arrival date & time: 02/05/16  1301  First Provider Contact:  First MD Initiated Contact with Patient 02/05/16 1345        History   Chief Complaint Chief Complaint  Patient presents with  . Flank Pain    HPI Gloria [name] is a 65 y.o. female.   She is a 65 year old woman here for evaluation of possible UTI. She states for the last 1-2 weeks she has had discomfort in her bilateral sides. In the last several days it has moved mostly to the right side. Yesterday, she developed some burning with urination. She has been trying cranberry juice without improvement. Denies any fevers or chills. No nausea or vomiting. She has had a UTI in the past, but it was several years ago.    Past Medical History:  Diagnosis Date  . Allergic rhinitis, cause unspecified 03/30/2013  . Anxiety state, unspecified   . Arthritis   . Borderline diabetes mellitus    a1c 7.0 (07/26/13)   . Chronic bronchitis   . Depressive disorder, not elsewhere classified   . Migraine, unspecified, without mention of intractable migraine without mention of status migrainosus   . Other and unspecified hyperlipidemia   . Panic attacks   . Postmenopausal symptoms   . Unspecified essential hypertension   . Unspecified urinary incontinence     Patient Active Problem List   Diagnosis Date Noted  . Encounter for preventative adult health care exam with abnormal findings 10/03/2015  . Peripheral edema 10/03/2015  . Morbid obesity ([place]) 01/01/2015  . 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 Asthma, chronic 10/17/2014  . Dyspnea 03/29/2014  . Upper airway cough syndrome 03/29/2014  . Vocal cord dysfunction 03/21/2014  . Diabetes type 2, uncontrolled ([place])   . Allergic rhinitis, cause unspecified 03/30/2013  . Chronic insomnia 09/21/2012  . Menopause 02/28/2011  . Hot flashes, menopausal 02/28/2011  . Hyperlipidemia 06/18/2010  . ARTHRITIS 06/18/2010  . ANXIETY  06/14/2010  . DEPRESSION 06/14/2010  . Migraine 06/14/2010  . Essential hypertension 06/14/2010    Past Surgical History:  Procedure Laterality Date  . ABDOMINAL HYSTERECTOMY  2002  . DIAGNOSTIC LAPAROSCOPY    . dislocated shoulder     right  . tumor right ankle      OB History    Gravida Para Term Preterm AB Living   1 1 1     1    SAB TAB Ectopic Multiple Live Births                   Home Medications    Prior to Admission medications   Medication Sig Start Date End Date Taking? Authorizing Provider  albuterol (PROAIR HFA) 108 (90 BASE) MCG/ACT inhaler Inhale 2 puffs into the lungs every 4 (four) hours as needed for wheezing (((PLAN B))).    Historical Provider, MD  albuterol (PROVENTIL) (2.5 MG/3ML) 0.083% nebulizer solution Inhale 3 mLs (2.5 mg total) into the lungs every 4 (four) hours as needed for wheezing or shortness of breath (((PLAN C))). 10/17/14   Dorothyann Peng, NP  ALPRAZolam Duanne Moron) 1 MG tablet Take 1 mg by mouth 2 (two) times daily as needed for anxiety.     Historical Provider, MD  budesonide-formoterol (SYMBICORT) 160-4.5 MCG/ACT inhaler Inhale 2 puffs into the lungs 2 (two) times daily.    Historical Provider, MD  cephALEXin (KEFLEX) 500 MG capsule Take 1 capsule (500 mg total) by mouth 4 (four) times daily. 02/05/16  Melony Overly, MD  Cholecalciferol (VITAMIN D3) 5000 UNITS CAPS Take 1 capsule by mouth every morning.    Historical Provider, MD  dextromethorphan-guaiFENesin (MUCINEX DM) 30-600 MG per 12 hr tablet Take 1 tablet by mouth 2 (two) times daily as needed (w/ flutter valve for cough and congestion).     Historical Provider, MD  fluticasone (FLONASE) 50 MCG/ACT nasal spray Place 2 sprays into both nostrils daily as needed for allergies or rhinitis. Patient taking differently: Place 2 sprays into both nostrils daily as needed (nasal congestion).  11/03/14   Tanda Rockers, MD  furosemide (LASIX) 20 MG tablet Take 1 tablet (20 mg total) by mouth daily. 10/03/15    Biagio Borg, MD  glucose blood (ACCU-CHEK AVIVA) test strip Use as instructed 01/21/14   Kennyth Arnold, FNP  HYDROcodone-acetaminophen (NORCO/VICODIN) 5-325 MG tablet Take 1-2 tablets by mouth every 4 (four) hours as needed (if still coughing). 12/29/15   Tanda Rockers, MD  irbesartan (AVAPRO) 150 MG tablet TAKE 1 TABLET(150 MG) BY MOUTH DAILY 01/08/16   Tanda Rockers, MD  omeprazole (PRILOSEC) 40 MG capsule TAKE 1 CAPSULE BY MOUTH DAILY 10/19/15   Tanda Rockers, MD  ondansetron (ZOFRAN) 4 MG tablet Take 4 mg by mouth every 8 (eight) hours as needed for nausea or vomiting.    Historical Provider, MD  phenazopyridine (PYRIDIUM) 200 MG tablet Take 1 tablet (200 mg total) by mouth 3 (three) times daily. 02/05/16   Melony Overly, MD  potassium chloride (K-DUR) 10 MEQ tablet Take 1 tablet (10 mEq total) by mouth daily. Patient taking differently: Take 10 mEq by mouth every morning.  06/09/15   Biagio Borg, MD  Respiratory Therapy Supplies (FLUTTER) DEVI Use as directed 03/30/14   Tanda Rockers, MD    Family History Family History  Problem Relation Age of Onset  . Arthritis      family history  . Hypertension      grandparent  . Colon cancer Maternal Grandfather   . Asthma Son     Social History Social History  Substance Use Topics  . Smoking status: Never Smoker  . Smokeless tobacco: Never Used  . Alcohol use No     Allergies   Compazine; Haloperidol decanoate; Penicillins; Metformin and related; Codeine; and Lisinopril   Review of Systems Review of Systems  Constitutional: Negative for chills and fever.  Gastrointestinal: Positive for abdominal pain (bilateral sides). Negative for nausea and vomiting.  Genitourinary: Positive for dysuria.     Physical Exam Triage Vital Signs ED Triage Vitals  Enc Vitals Group     BP 02/05/16 1325 153/82     Pulse Rate 02/05/16 1325 94     Resp 02/05/16 1325 16     Temp 02/05/16 1325 98.6 F (37 C)     Temp Source 02/05/16 1325 Oral      SpO2 02/05/16 1325 98 %     Weight --      Height --      Head Circumference --      Peak Flow --      Pain Score 02/05/16 1349 10     Pain Loc --      Pain Edu? --      Excl. in [place]? --    No data found.   Updated Vital Signs BP 153/82 (BP Location: Right Arm)   Pulse 94   Temp 98.6 F (37 C) (Oral)   Resp 16  SpO2 98%   Visual Acuity Right Eye Distance:   Left Eye Distance:   Bilateral Distance:    Right Eye Near:   Left Eye Near:    Bilateral Near:     Physical Exam  Constitutional: She is oriented to person, place, and time. She appears well-developed and well-nourished. No distress.  Cardiovascular: Normal rate.   Pulmonary/Chest: Effort normal.  Abdominal: Soft. She exhibits no distension and no mass. There is no tenderness. There is no guarding.  No CVA tenderness  Neurological: She is alert and oriented to person, place, and time.     UC Treatments / Results  Labs (all labs ordered are listed, but only abnormal results are displayed) Labs Reviewed  POCT URINALYSIS DIP (DEVICE) - Abnormal; Notable for the following:       Result Value   Hgb urine dipstick TRACE (*)    Nitrite POSITIVE (*)    Leukocytes, UA TRACE (*)    All other components within normal limits  URINE CULTURE    EKG  EKG Interpretation None       Radiology No results found.  Procedures Procedures (including critical care time)  Medications Ordered in UC Medications - No data to display   Initial Impression / Assessment and Plan / UC Course  I have reviewed the triage vital signs and the nursing notes.  Pertinent labs & imaging results that were available during my care of the patient were reviewed by me and considered in my medical decision making (see chart for details).  Clinical Course    UA consistent with UTI. Urine culture ordered. Patient to return to provide sample. Treat with Keflex and Pyridium. Return precautions reviewed.  Final Clinical  Impressions(s) / UC Diagnoses   Final diagnoses:  UTI (lower urinary tract infection)    New Prescriptions New Prescriptions   CEPHALEXIN (KEFLEX) 500 MG CAPSULE    Take 1 capsule (500 mg total) by mouth 4 (four) times daily.   PHENAZOPYRIDINE (PYRIDIUM) 200 MG TABLET    Take 1 tablet (200 mg total) by mouth 3 (three) times daily.     Melony Overly, MD 02/05/16 1416

## 2016-02-06 ENCOUNTER — Telehealth: Payer: Self-pay | Admitting: Internal Medicine

## 2016-02-06 ENCOUNTER — Ambulatory Visit: Payer: 59 | Admitting: Internal Medicine

## 2016-02-06 ENCOUNTER — Ambulatory Visit (AMBULATORY_SURGERY_CENTER): Payer: Self-pay

## 2016-02-06 VITALS — Ht 67.0 in | Wt 215.0 lb

## 2016-02-06 DIAGNOSIS — E119 Type 2 diabetes mellitus without complications: Secondary | ICD-10-CM

## 2016-02-06 DIAGNOSIS — Z800 Family history of malignant neoplasm of digestive organs: Secondary | ICD-10-CM

## 2016-02-06 DIAGNOSIS — Z86010 Personal history of colonic polyps: Secondary | ICD-10-CM

## 2016-02-06 LAB — URINE CULTURE

## 2016-02-06 MED ORDER — NA SULFATE-K SULFATE-MG SULF 17.5-3.13-1.6 GM/177ML PO SOLN: ORAL | 0 refills | Status: DC

## 2016-02-06 NOTE — Telephone Encounter (Signed)
Referral done

## 2016-02-06 NOTE — Progress Notes (Signed)
Per pt, no allergies to soy or egg products.Pt not taking any weight loss meds or using  O2 at home. 

## 2016-02-06 NOTE — Telephone Encounter (Signed)
patient walked in todjay to request that we refer her to an endocrinologist. She states that she has not been taking her metformin because it gives her diarrhea. She feels that her condition is serious enough to get an endo involved.   She is hoping to get an appt before 02/20/2016, when she has a colonoscopy scheduled

## 2016-02-08 NOTE — Telephone Encounter (Signed)
Pt states she is still taking her metformin 1 time a day and still giving her diarrhea. Pt wants to know if she can stop it and prescribe something else. Please advise thanks.

## 2016-02-08 NOTE — Telephone Encounter (Signed)
Patient aware.

## 2016-02-08 NOTE — Telephone Encounter (Signed)
Ok to stop the metformin  Would need ROV for further consideration of change in tx

## 2016-02-20 ENCOUNTER — Encounter: Payer: Self-pay | Admitting: Internal Medicine

## 2016-02-20 ENCOUNTER — Ambulatory Visit (AMBULATORY_SURGERY_CENTER): Payer: Medicare Other | Admitting: Internal Medicine

## 2016-02-20 VITALS — BP 149/58 | HR 83 | Temp 97.5°F | Resp 16 | Ht 67.0 in | Wt 215.0 lb

## 2016-02-20 DIAGNOSIS — Z86010 Personal history of colonic polyps: Secondary | ICD-10-CM

## 2016-02-20 LAB — GLUCOSE, CAPILLARY
Glucose-Capillary: 154 mg/dL — ABNORMAL HIGH (ref 65–99)
Glucose-Capillary: 149 mg/dL — ABNORMAL HIGH (ref 65–99)

## 2016-02-20 MED ORDER — SODIUM CHLORIDE 0.9 % IV SOLN: 500.0000 mL | INTRAVENOUS | Status: DC

## 2016-02-20 NOTE — Progress Notes (Signed)
To recovery, report to [place], RN, VSS 

## 2016-02-20 NOTE — Patient Instructions (Signed)
YOU HAD AN ENDOSCOPIC PROCEDURE TODAY AT THE [facility]:   Refer to the procedure report that was given to you for any specific questions about what was found during the examination.  If the procedure report does not answer your questions, please call your gastroenterologist to clarify.  If you requested that your care partner not be given the details of your procedure findings, then the procedure report has been included in a sealed envelope for you to review at your convenience later.  YOU SHOULD EXPECT: Some feelings of bloating in the abdomen. Passage of more gas than usual.  Walking can help get rid of the air that was put into your GI tract during the procedure and reduce the bloating. If you had a lower endoscopy (such as a colonoscopy or flexible sigmoidoscopy) you may notice spotting of blood in your stool or on the toilet paper. If you underwent a bowel prep for your procedure, you may not have a normal bowel movement for a few days.  Please Note:  You might notice some irritation and congestion in your nose or some drainage.  This is from the oxygen used during your procedure.  There is no need for concern and it should clear up in a day or so.  SYMPTOMS TO REPORT IMMEDIATELY:   Following lower endoscopy (colonoscopy or flexible sigmoidoscopy):  Excessive amounts of blood in the stool  Significant tenderness or worsening of abdominal pains  Swelling of the abdomen that is new, acute  Fever of 100F or higher  For urgent or emergent issues, a gastroenterologist can be reached at any hour by calling [phone].  DIET:  We do recommend a small meal at first, but then you may proceed to your regular diet.  Drink plenty of fluids but you should avoid alcoholic beverages for 24 hours.  ACTIVITY:  You should plan to take it easy for the rest of today and you should NOT DRIVE or use heavy machinery until tomorrow (because of the sedation medicines used during the test).     FOLLOW UP: Our staff will call the number listed on your records the next business day following your procedure to check on you and address any questions or concerns that you may have regarding the information given to you following your procedure. If we do not reach you, we will leave a message.  However, if you are feeling well and you are not experiencing any problems, there is no need to return our call.  We will assume that you have returned to your regular daily activities without incident.  SIGNATURES/CONFIDENTIALITY: You and/or your care partner have signed paperwork which will be entered into your electronic medical record.  These signatures attest to the fact that that the information above on your After Visit Summary has been reviewed and is understood.  Full responsibility of the confidentiality of this discharge information lies with you and/or your care-partner.  Next colonoscopy- 10 years  Please read over handouts about diverticulosis, hemorrhoids and high fiber diets  Continue your normal medications 

## 2016-02-20 NOTE — Op Note (Signed)
Gloria [name]: Gloria [name] Procedure Date: 02/20/2016 7:59 AM MRN: [id number] Endoscopist: Docia Chuck. Gloria [name] , MD Age: 65 Referring MD:  Date of Birth: 1950-07-12 Gender: Female Account #: [id number] Procedure:                Colonoscopy Indications:              Surveillance: Personal history of adenomatous                            polyps on last colonoscopy 5 years ago, High risk                            colon cancer surveillance: Personal history of                            non-advanced adenoma, index 10-2010 w/ diminutive TAs Medicines:                Monitored Anesthesia Care Procedure:                Pre-Anesthesia Assessment:                           - Prior to the procedure, a History and Physical                            was performed, and patient medications and                            allergies were reviewed. The patient's tolerance of                            previous anesthesia was also reviewed. The risks                            and benefits of the procedure and the sedation                            options and risks were discussed with the patient.                            All questions were answered, and informed consent                            was obtained. Prior Anticoagulants: The patient has                            taken no previous anticoagulant or antiplatelet                            agents. ASA Grade Assessment: II - A patient with                            mild systemic disease. After reviewing the risks  and benefits, the patient was deemed in                            satisfactory condition to undergo the procedure.                           After obtaining informed consent, the colonoscope                            was passed under direct vision. Throughout the                            procedure, the patient's blood pressure, pulse, and                            oxygen saturations  were monitored continuously. The                            Model CF-HQ190L [id number]) scope was introduced                            through the anus and advanced to the the cecum,                            identified by appendiceal orifice and ileocecal                            valve. The ileocecal valve, appendiceal orifice,                            and rectum were photographed. The quality of the                            bowel preparation was excellent. The colonoscopy                            was performed without difficulty. The patient                            tolerated the procedure well. The bowel preparation                            used was SUPREP. Scope In: 8:11:33 AM Scope Out: 8:21:36 AM Scope Withdrawal Time: 0 hours 8 minutes 15 seconds  Total Procedure Duration: 0 hours 10 minutes 3 seconds  Findings:                 Multiple diverticula were found in the entire colon.                           Internal hemorrhoids were found during retroflexion.                           The exam was otherwise without abnormality on  direct and retroflexion views. Complications:            No immediate complications. Estimated blood loss:                            None. Estimated Blood Loss:     Estimated blood loss: none. Impression:               - Diverticulosis in the entire examined colon.                           - Internal hemorrhoids.                           - The examination was otherwise normal on direct                            and retroflexion views.                           - No specimens collected. Recommendation:           - Repeat colonoscopy in 10 years for surveillance.                           - Patient has a contact number available for                            emergencies. The signs and symptoms of potential                            delayed complications were discussed with the                             patient. Return to normal activities tomorrow.                            Written discharge instructions were provided to the                            patient.                           - Resume previous diet.                           - Continue present medications. Docia Chuck. Gloria Pastor, MD 02/20/2016 8:26:34 AM This report has been signed electronically.

## 2016-02-21 ENCOUNTER — Telehealth: Payer: Self-pay

## 2016-02-21 NOTE — Telephone Encounter (Signed)
  Follow up Call-  Call back number 02/20/2016  Post procedure Call Back phone  # [phone]  Permission to leave phone message Yes  Some recent data might be hidden     Patient questions:  Do you have a fever, pain , or abdominal swelling? No. Pain Score  0 *  Have you tolerated food without any problems? Yes.    Have you been able to return to your normal activities? Yes.    Do you have any questions about your discharge instructions: Diet   No. Medications  No. Follow up visit  No.  Do you have questions or concerns about your Care? No.  Actions: * If pain score is 4 or above: No action needed, pain <4.

## 2016-02-28 ENCOUNTER — Encounter: Payer: Self-pay | Admitting: Internal Medicine

## 2016-02-28 ENCOUNTER — Ambulatory Visit (INDEPENDENT_AMBULATORY_CARE_PROVIDER_SITE_OTHER): Payer: Medicare Other | Admitting: Internal Medicine

## 2016-02-28 VITALS — BP 140/80 | HR 94 | Temp 98.0°F | Resp 20 | Wt 209.0 lb

## 2016-02-28 DIAGNOSIS — M25561 Pain in right knee: Secondary | ICD-10-CM | POA: Diagnosis not present

## 2016-02-28 DIAGNOSIS — N390 Urinary tract infection, site not specified: Secondary | ICD-10-CM | POA: Insufficient documentation

## 2016-02-28 DIAGNOSIS — E1140 Type 2 diabetes mellitus with diabetic neuropathy, unspecified: Secondary | ICD-10-CM

## 2016-02-28 DIAGNOSIS — M533 Sacrococcygeal disorders, not elsewhere classified: Secondary | ICD-10-CM

## 2016-02-28 DIAGNOSIS — E1165 Type 2 diabetes mellitus with hyperglycemia: Secondary | ICD-10-CM

## 2016-02-28 DIAGNOSIS — N3000 Acute cystitis without hematuria: Secondary | ICD-10-CM | POA: Diagnosis not present

## 2016-02-28 DIAGNOSIS — M545 Low back pain, unspecified: Secondary | ICD-10-CM | POA: Insufficient documentation

## 2016-02-28 DIAGNOSIS — M25572 Pain in left ankle and joints of left foot: Secondary | ICD-10-CM | POA: Insufficient documentation

## 2016-02-28 DIAGNOSIS — IMO0002 Reserved for concepts with insufficient information to code with codable children: Secondary | ICD-10-CM

## 2016-02-28 DIAGNOSIS — R300 Dysuria: Secondary | ICD-10-CM

## 2016-02-28 DIAGNOSIS — M25562 Pain in left knee: Secondary | ICD-10-CM

## 2016-02-28 DIAGNOSIS — M25571 Pain in right ankle and joints of right foot: Secondary | ICD-10-CM | POA: Insufficient documentation

## 2016-02-28 LAB — POCT URINALYSIS DIPSTICK
Bilirubin, UA: NEGATIVE
Blood, UA: NEGATIVE
Glucose, UA: NEGATIVE
Ketones, UA: NEGATIVE
Leukocytes, UA: NEGATIVE
Protein, UA: NEGATIVE
Spec Grav, UA: 1.025
Urobilinogen, UA: NEGATIVE
pH, UA: 6

## 2016-02-28 MED ORDER — CELECOXIB 200 MG PO CAPS: 200.0000 mg | ORAL_CAPSULE | Freq: Two times a day (BID) | ORAL | 3 refills | Status: DC | PRN

## 2016-02-28 MED ORDER — HYDROCODONE-ACETAMINOPHEN 5-325 MG PO TABS: 1.0000 | ORAL_TABLET | ORAL | 0 refills | Status: DC | PRN

## 2016-02-28 NOTE — Progress Notes (Signed)
Pre visit review using our clinic review tool, if applicable. No additional management support is needed unless otherwise documented below in the visit note. 

## 2016-02-28 NOTE — Progress Notes (Addendum)
Subjective:    Patient ID: Gloria [name], female    DOB: 22-Mar-1951, 65 y.o.   MRN: [id number]  HPI  Here to fu recent clinical UTI abd pain and dysuria, abnormal UA and indeterminate culture from aug 7.  Did improve with cephalexain, and no further symptoms except did notice some urinary orange kind of color possibly yesterday but o/w Denies urinary symptoms such as dysuria, frequency, urgency, flank pain, hematuria or n/v, fever, chills.   Pt denies polydipsia, polyuria, has appt to f/u with endo soon for DM,  Due for DM education referral today.  Also c/o ongoing coccyx dull aching pain intermittent mild for many months after a fall.  Also has mod to occas severe left > right knee pain with swelling worse in past few months, without giveaways or falls.  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  Did not try the gabapentin after read the side effects listed at the pharmacy including dementia.  Has been taking advil and tylenol frequently.  Has only taken the hydrocodone infreq as prescribed in late June per Dr Melvyn Novas and has not abused or diverted.   Past Medical History:  Diagnosis Date  . Allergic rhinitis, cause unspecified 03/30/2013  . Anxiety state, unspecified   . Arthritis   . Asthma   . Borderline diabetes mellitus    a1c 7.0 (07/26/13)   . Chronic bronchitis   . Depressive disorder, not elsewhere classified   . Diabetes mellitus without complication ([place])   . Difficulty waking    hard to wake up past sedation!  . Migraine, unspecified, without mention of intractable migraine without mention of status migrainosus   . Other and unspecified hyperlipidemia   . Panic attacks   . Postmenopausal symptoms   . Unspecified essential hypertension   . Unspecified urinary incontinence   . UTI (lower urinary tract infection)    on 02/05/16/on meds   Past Surgical History:  Procedure Laterality Date  . ABDOMINAL  HYSTERECTOMY  2002  . DIAGNOSTIC LAPAROSCOPY    . dislocated shoulder     right  . tumor right ankle      reports that she has never smoked. She has never used smokeless tobacco. She reports that she does not drink alcohol or use drugs. family history includes Asthma in her son; Colon cancer in her maternal grandfather; Diabetes in her maternal grandmother, maternal uncle, and paternal uncle; Hypertension in her mother; Rheum arthritis in her mother. Allergies  Allergen Reactions  . Compazine Other (See Comments)    Stroke like symptoms  . Haloperidol Decanoate Other (See Comments)    Extrapyramidal syndrome  . Penicillins Nausea And Vomiting  . Metformin And Related Other (See Comments)    GI upset  . Codeine Itching  . Lisinopril Cough   Current Outpatient Prescriptions on File Prior to Visit  Medication Sig Dispense Refill  . albuterol (PROAIR HFA) 108 (90 BASE) MCG/ACT inhaler Inhale 2 puffs into the lungs every 4 (four) hours as needed for wheezing (((PLAN B))).    Marland Kitchen albuterol (PROVENTIL) (2.5 MG/3ML) 0.083% nebulizer solution Inhale 3 mLs (2.5 mg total) into the lungs every 4 (four) hours as needed for wheezing or shortness of breath (((PLAN C))). 75 mL 2  . ALPRAZolam (XANAX) 1 MG tablet Take 1 mg by mouth 2 (two) times daily as needed for anxiety.     . budesonide-formoterol (SYMBICORT) 160-4.5 MCG/ACT inhaler Inhale 2 puffs  into the lungs 2 (two) times daily.    . Cholecalciferol (VITAMIN D3) 5000 UNITS CAPS Take 1 capsule by mouth every morning.    Marland Kitchen dextromethorphan-guaiFENesin (MUCINEX DM) 30-600 MG per 12 hr tablet Take 1 tablet by mouth 2 (two) times daily as needed (w/ flutter valve for cough and congestion).     . fluticasone (FLONASE) 50 MCG/ACT nasal spray Place 2 sprays into both nostrils daily as needed for allergies or rhinitis. 1 g 11  . furosemide (LASIX) 20 MG tablet Take 1 tablet (20 mg total) by mouth daily. (Patient taking differently: Take 20 mg by mouth as  needed. ) 90 tablet 1  . glucose blood (ACCU-CHEK AVIVA) test strip Use as instructed 100 each 12  . irbesartan (AVAPRO) 150 MG tablet TAKE 1 TABLET(150 MG) BY MOUTH DAILY 90 tablet 0  . Multiple Vitamin (MULTIVITAMIN) tablet Take 1 tablet by mouth daily. Woman's MVI-TAke one daily    . omeprazole (PRILOSEC) 40 MG capsule TAKE 1 CAPSULE BY MOUTH DAILY 90 capsule 2  . ondansetron (ZOFRAN) 4 MG tablet Take 4 mg by mouth every 8 (eight) hours as needed for nausea or vomiting.    . potassium chloride (K-DUR) 10 MEQ tablet Take 1 tablet (10 mEq total) by mouth daily. (Patient taking differently: Take 10 mEq by mouth every morning. ) 90 tablet 3  . Respiratory Therapy Supplies (FLUTTER) DEVI Use as directed 1 each 0   No current facility-administered medications on file prior to visit.    Review of Systems  Constitutional: Negative for unusual diaphoresis or night sweats HENT: Negative for ear swelling or discharge Eyes: Negative for worsening visual haziness  Respiratory: Negative for choking and stridor.   Gastrointestinal: Negative for distension or worsening eructation Genitourinary: Negative for retention or change in urine volume.  Musculoskeletal: Negative for other MSK pain or swelling Skin: Negative for color change and worsening wound Neurological: Negative for tremors and numbness other than noted  Psychiatric/Behavioral: Negative for decreased concentration or agitation other than above       Objective:   Physical Exam BP 140/80   Pulse 94   Temp 98 F (36.7 C) (Oral)   Resp 20   Wt 209 lb (94.8 kg)   SpO2 96%   BMI 32.73 kg/m  VS noted,  Constitutional: Pt appears in no apparent distress HENT: Head: NCAT.  Right Ear: External ear normal.  Left Ear: External ear normal.  Eyes: . Pupils are equal, round, and reactive to light. Conjunctivae and EOM are normal Neck: Normal range of motion. Neck supple.  Cardiovascular: Normal rate and regular rhythm.   Pulmonary/Chest:  Effort normal and breath sounds without rales or wheezing.  Abd:  Soft, NT, ND, + BS, no flank tender Neurological: Pt is alert. Not confused , motor grossly intact Skin: Skin is warm. No rash, no LE edema Psychiatric: Pt behavior is normal. No agitation.  Coccyx nontender, no swelling, no rash Left > right knee with bony degen changes with left trace effusion, decreased ROM Left > right bilat ankle bony degen changes without effusion but bilat reduced ROM  POCT urinalysis dipstick  Order: [id number]  Status:  Final result Visible to patient:  No (Not Released) Dx:  Dysuria    Ref Range & Units 08:45 3wk ago 52mo ago   Color, UA  yellow     Clarity, UA  cloudy     Glucose, UA  negative NEGATIVE    Bilirubin, UA  negative  Ketones, UA  negative     Spec Grav, UA  1.025     Blood, UA  negative     pH, UA  6.0     Protein, UA  negative     Urobilinogen, UA  negative 0.2 0.2   Nitrite, UA  postive     Leukocytes, UA Negative Negative TRACER, CM  TRACE   Resulting Agency               Assessment & Plan:

## 2016-02-28 NOTE — Assessment & Plan Note (Signed)
C/w most likely DJD, cant r/o cartilage, for sport med referral., celebrex bid prn, also very limited refill hydrocodone prn

## 2016-02-28 NOTE — Assessment & Plan Note (Signed)
Chronic mild recurrent, states tylenol ok, cont to follow for now, declines films

## 2016-02-28 NOTE — Assessment & Plan Note (Signed)
C/w djd, will hold on imaging for now, refer sports medicine

## 2016-02-28 NOTE — Patient Instructions (Addendum)
We are sending the specimen for culture, and you should be called in 2-3 days if it is not normal  Please take all new medication as prescribed - the celebrex for pain  It is ok to try the gabapentin as you have been prescribed for the nerve issue  Please continue all other medications as before, and refills have been done if requested - the hydrocodone for rare breakthrough pain  Please have the pharmacy call with any other refills you may need.  Please continue your efforts at being more active, low cholesterol diet, and weight control.  You will be contacted regarding the referral for: DM education (for diet and everything else)  You will be contacted regarding the referral for: Dr Tamala Julian (sports med in this office) - or you can make an appt at the desk as you leave  Please keep your appointments with your specialists as you may have planned  .

## 2016-02-28 NOTE — Assessment & Plan Note (Signed)
.   Lab Results  Component Value Date   HGBA1C 6.9 (H) 10/03/2015   Fair control, for endo f/u per pt request, also refer DM educaiton, cont same tx

## 2016-02-28 NOTE — Assessment & Plan Note (Addendum)
Udip in office neg, symptoms improved, afeb, vss, ok to hold on furhter antibx, check urine cx and tx only if abnormal  Note:  Total time for pt hx, exam, review of record with pt in the room, determination of diagnoses and plan for further eval and tx is > 40 min, with over 50% spent in coordination and counseling of patient

## 2016-02-29 ENCOUNTER — Telehealth: Payer: Self-pay

## 2016-02-29 ENCOUNTER — Other Ambulatory Visit: Payer: Medicare Other

## 2016-02-29 DIAGNOSIS — N3000 Acute cystitis without hematuria: Secondary | ICD-10-CM

## 2016-02-29 NOTE — Telephone Encounter (Signed)
Patient is coming in for a recollect of urine specimen.

## 2016-03-01 ENCOUNTER — Other Ambulatory Visit (INDEPENDENT_AMBULATORY_CARE_PROVIDER_SITE_OTHER): Payer: Medicare Other

## 2016-03-01 DIAGNOSIS — N3000 Acute cystitis without hematuria: Secondary | ICD-10-CM | POA: Diagnosis not present

## 2016-03-01 LAB — URINALYSIS, ROUTINE W REFLEX MICROSCOPIC
Bilirubin Urine: NEGATIVE
Ketones, ur: NEGATIVE
Leukocytes, UA: NEGATIVE
Nitrite: POSITIVE — AB
Specific Gravity, Urine: 1.015 (ref 1.000–1.030)
Total Protein, Urine: NEGATIVE
Urine Glucose: NEGATIVE
Urobilinogen, UA: 0.2 (ref 0.0–1.0)
pH: 6 (ref 5.0–8.0)

## 2016-03-03 ENCOUNTER — Other Ambulatory Visit: Payer: Self-pay | Admitting: Internal Medicine

## 2016-03-03 LAB — URINE CULTURE

## 2016-03-03 MED ORDER — LEVOFLOXACIN 250 MG PO TABS: 250.0000 mg | ORAL_TABLET | Freq: Every day | ORAL | 0 refills | Status: AC

## 2016-03-05 ENCOUNTER — Encounter: Payer: Self-pay | Admitting: Internal Medicine

## 2016-03-11 ENCOUNTER — Other Ambulatory Visit (HOSPITAL_COMMUNITY): Payer: 59

## 2016-03-19 ENCOUNTER — Encounter: Payer: Self-pay | Admitting: Student

## 2016-03-21 ENCOUNTER — Ambulatory Visit (HOSPITAL_COMMUNITY): Payer: Medicare Other | Attending: Internal Medicine

## 2016-03-21 ENCOUNTER — Other Ambulatory Visit: Payer: Self-pay

## 2016-03-21 DIAGNOSIS — I5031 Acute diastolic (congestive) heart failure: Secondary | ICD-10-CM | POA: Diagnosis not present

## 2016-03-21 DIAGNOSIS — R931 Abnormal findings on diagnostic imaging of heart and coronary circulation: Secondary | ICD-10-CM | POA: Insufficient documentation

## 2016-03-21 DIAGNOSIS — I340 Nonrheumatic mitral (valve) insufficiency: Secondary | ICD-10-CM | POA: Insufficient documentation

## 2016-03-21 DIAGNOSIS — I517 Cardiomegaly: Secondary | ICD-10-CM | POA: Insufficient documentation

## 2016-03-21 DIAGNOSIS — R609 Edema, unspecified: Secondary | ICD-10-CM | POA: Diagnosis not present

## 2016-03-22 ENCOUNTER — Encounter: Payer: Self-pay | Admitting: Internal Medicine

## 2016-03-22 ENCOUNTER — Other Ambulatory Visit: Payer: Self-pay | Admitting: Internal Medicine

## 2016-03-22 DIAGNOSIS — R931 Abnormal findings on diagnostic imaging of heart and coronary circulation: Secondary | ICD-10-CM

## 2016-03-26 NOTE — Progress Notes (Signed)
[name] [address] [place], [place] [address] Phone: [phone] Subjective:    I'm seeing this patient by the request  of:  Cathlean Cower, MD   CC: Knees, ankles and back pain f/u   [id number]  Gloria [name] is a 65 y.o. female coming in with complaint of Multiple muscle and joint pain. Patient states this past medical history in her family has discussed consistent with rheumatoid arthritis. Patient states that she is having increasing pain. Seems to be mostly in the knees bilaterally. States that this is severe enough that stops her from activity. Significant sounds she states when she goes up and down stairs as well. Sometimes has some instability. States that unfortunately though her back, hips and ankles also hurt. Only thing that seems to help her lidocaine patches and dilaudid. Patient feels like she should be walking with a cane. States that this hasn't cramping sensations are wake her up at night as well. States that this is affecting daily activities. Rates the severity of pain a 7 out of 10 on a regular basis and can be 10 out of 10.      Past Medical History:  Diagnosis Date  . Allergic rhinitis, cause unspecified 03/30/2013  . Anxiety state, unspecified   . Arthritis   . Asthma   . Borderline diabetes mellitus    a1c 7.0 (07/26/13)   . Chronic bronchitis   . Depressive disorder, not elsewhere classified   . Diabetes mellitus without complication ([place])   . Difficulty waking    hard to wake up past sedation!  . Migraine, unspecified, without mention of intractable migraine without mention of status migrainosus   . Other and unspecified hyperlipidemia   . Panic attacks   . Postmenopausal symptoms   . Unspecified essential hypertension   . Unspecified urinary incontinence   . UTI (lower urinary tract infection)    on 02/05/16/on meds   Past Surgical History:  Procedure Laterality Date  . ABDOMINAL HYSTERECTOMY  2002  . DIAGNOSTIC  LAPAROSCOPY    . dislocated shoulder     right  . tumor right ankle     Social History   Social History  . Marital status: Divorced    Spouse name: N/A  . Number of children: 1  . Years of education: N/A   Occupational History  . Self employed-interior design/flower arrangements    Social History Main Topics  . Smoking status: Never Smoker  . Smokeless tobacco: Never Used  . Alcohol use No  . Drug use: No  . Sexual activity: Not Currently   Other Topics Concern  . None   Social History Narrative   -Retired - does Company secretary   -Divorced in 2007   -One son - lives locally. 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 and entertain family. Bowling. Interior decorating         Allergies  Allergen Reactions  . Compazine Other (See Comments)    Stroke like symptoms  . Haloperidol Decanoate Other (See Comments)    Extrapyramidal syndrome  . Penicillins Nausea And Vomiting  . Metformin And Related Other (See Comments)    GI upset  . Codeine Itching  . Lisinopril Cough   Family History  Problem Relation Age of Onset  . Arthritis      family history  . Hypertension      grandparent  . Colon cancer Maternal Grandfather   .  Asthma Son   . Rheum arthritis Mother   . Hypertension Mother   . Diabetes Maternal Uncle   . Diabetes Paternal Uncle   . Diabetes Maternal Grandmother     Past medical history, social, surgical and family history all reviewed in electronic medical record.  No pertanent information unless stated regarding to the chief complaint.   Review of Systems: Pain positive  Objective  Blood pressure 130/84, pulse 90, weight 218 lb (98.9 kg), SpO2 98 %.  General: No apparent distress alert and oriented x3 mood and affect normal, dressed appropriately.  HEENT: Pupils equal, extraocular movements intact  Respiratory: Patient's speak in full sentences and does not appear short of breath  Cardiovascular: No lower  extremity edema, non tender, no erythema  Skin: Warm dry intact with no signs of infection or rash on extremities or on axial skeleton.  Abdomen: Soft nontender  Neuro: Cranial nerves II through XII are intact, neurovascularly intact in all extremities with 2+ DTRs and 2+ pulses.  Lymph: No lymphadenopathy of posterior or anterior cervical chain or axillae bilaterally.  Gait normal with good balance and coordination.  MSK:  Multiple joint pains. Patient has multiple tenderness to palpation on muscles as well as joints. Greater than 20 points. Knees bilaterally has some mild valgus deformity. No significant instability. Increasing crepitus with range of motion. Patient is tender to palpation over the medial joint line. No effusion noted. Full range of motion. Neurovascular intact distally.  After informed written and verbal consent, patient was seated on exam table. Right knee was prepped with alcohol swab and utilizing anterolateral approach, patient's right knee space was injected with 4:1  marcaine 0.5%: Kenalog 40mg /dL. Patient tolerated the procedure well without immediate complications.  After informed written and verbal consent, patient was seated on exam table. Left knee was prepped with alcohol swab and utilizing anterolateral approach, patient's left knee space was injected with 4:1  marcaine 0.5%: Kenalog 40mg /dL. Patient tolerated the procedure well without immediate complications.      Impression and Recommendations:     This case required medical decision making of moderate complexity.      Note: This dictation was prepared with Dragon dictation along with smaller phrase technology. Any transcriptional errors that result from this process are unintentional.

## 2016-03-27 ENCOUNTER — Encounter: Payer: Self-pay | Admitting: Family Medicine

## 2016-03-27 ENCOUNTER — Other Ambulatory Visit (INDEPENDENT_AMBULATORY_CARE_PROVIDER_SITE_OTHER): Payer: Medicare Other

## 2016-03-27 ENCOUNTER — Ambulatory Visit (INDEPENDENT_AMBULATORY_CARE_PROVIDER_SITE_OTHER): Payer: Medicare Other | Admitting: Family Medicine

## 2016-03-27 VITALS — BP 130/84 | HR 90 | Wt 218.0 lb

## 2016-03-27 DIAGNOSIS — M2550 Pain in unspecified joint: Secondary | ICD-10-CM | POA: Diagnosis not present

## 2016-03-27 DIAGNOSIS — R768 Other specified abnormal immunological findings in serum: Secondary | ICD-10-CM

## 2016-03-27 DIAGNOSIS — M170 Bilateral primary osteoarthritis of knee: Secondary | ICD-10-CM | POA: Diagnosis not present

## 2016-03-27 LAB — CBC WITH DIFFERENTIAL/PLATELET
Basophils Absolute: 0 10*3/uL (ref 0.0–0.1)
Basophils Relative: 0.3 % (ref 0.0–3.0)
Eosinophils Absolute: 0.6 10*3/uL (ref 0.0–0.7)
Eosinophils Relative: 4.9 % (ref 0.0–5.0)
HCT: 33.7 % — ABNORMAL LOW (ref 36.0–46.0)
Hemoglobin: 11.4 g/dL — ABNORMAL LOW (ref 12.0–15.0)
Lymphocytes Relative: 18.4 % (ref 12.0–46.0)
Lymphs Abs: 2.4 10*3/uL (ref 0.7–4.0)
MCHC: 33.8 g/dL (ref 30.0–36.0)
MCV: 84.1 fl (ref 78.0–100.0)
Monocytes Absolute: 0.4 10*3/uL (ref 0.1–1.0)
Monocytes Relative: 3.3 % (ref 3.0–12.0)
Neutro Abs: 9.3 10*3/uL — ABNORMAL HIGH (ref 1.4–7.7)
Neutrophils Relative %: 73.1 % (ref 43.0–77.0)
Platelets: 287 10*3/uL (ref 150.0–400.0)
RBC: 4.01 Mil/uL (ref 3.87–5.11)
RDW: 15.2 % (ref 11.5–15.5)
WBC: 12.8 10*3/uL — ABNORMAL HIGH (ref 4.0–10.5)

## 2016-03-27 LAB — VITAMIN D 25 HYDROXY (VIT D DEFICIENCY, FRACTURES): VITD: 24.35 ng/mL — ABNORMAL LOW (ref 30.00–100.00)

## 2016-03-27 LAB — IBC PANEL
Iron: 36 ug/dL — ABNORMAL LOW (ref 42–145)
Saturation Ratios: 12.2 % — ABNORMAL LOW (ref 20.0–50.0)
Transferrin: 210 mg/dL — ABNORMAL LOW (ref 212.0–360.0)

## 2016-03-27 LAB — TSH: TSH: 3.26 u[IU]/mL (ref 0.35–4.50)

## 2016-03-27 LAB — C-REACTIVE PROTEIN: CRP: 2.1 mg/dL (ref 0.5–20.0)

## 2016-03-27 LAB — FERRITIN: Ferritin: 52.4 ng/mL (ref 10.0–291.0)

## 2016-03-27 LAB — SEDIMENTATION RATE: Sed Rate: 82 mm/hr — ABNORMAL HIGH (ref 0–30)

## 2016-03-27 MED ORDER — TIZANIDINE HCL 4 MG PO TABS: 4.0000 mg | ORAL_TABLET | Freq: Every evening | ORAL | 2 refills | Status: AC

## 2016-03-27 MED ORDER — VITAMIN D (ERGOCALCIFEROL) 1.25 MG (50000 UNIT) PO CAPS: 50000.0000 [IU] | ORAL_CAPSULE | ORAL | 0 refills | Status: DC

## 2016-03-27 NOTE — Assessment & Plan Note (Signed)
Bilateral injections given today. Tolerated the procedure well. We discussed icing regimen and home exercises. We discussed which activities to do in which ones to potentially avoid. Patient will try topical anti-inflammatories. Patient come back and see me again in 4 weeks. She could be a candidate for viscous supplementation. X-rays will be needed at follow-up.

## 2016-03-27 NOTE — Patient Instructions (Addendum)
God to see you  Ice 20 minutes 2 times daily. Usually after activity and before bed. Injected both knees today  We will get some labs soon.  Spenco orthotics "total support" online would be great  pennsaid pinkie amount topically 2 times daily as needed.  Once weekly vitamin D for next 12 weeks.  zanaflex at night/.  Stay active,  Turmeric 500mg  twice daily  Iron 65mg  daily with 500mg  of vitamin C See me again in 3 weeks.

## 2016-03-27 NOTE — Assessment & Plan Note (Signed)
Patient is complaining of significant number of joint pains. Patient seems to be worse over the knees and does have some degenerative joint disease. Was given injections for the knees today. Encourage weight loss. We will get labs to rule out any autoimmune diseases that could be contributing to her in the systemic pain. Patient did ask for pain medications which we declined. We discussed over-the-counter medications a could be beneficial. Patient given topical anti-inflammatories a try. Follow-up again in 4 weeks.

## 2016-03-28 ENCOUNTER — Other Ambulatory Visit: Payer: Self-pay | Admitting: *Deleted

## 2016-03-28 DIAGNOSIS — R768 Other specified abnormal immunological findings in serum: Secondary | ICD-10-CM

## 2016-03-28 LAB — ANTI-DNA ANTIBODY, DOUBLE-STRANDED: ds DNA Ab: 3 IU/mL

## 2016-03-28 LAB — ANTI-NUCLEAR AB-TITER (ANA TITER): ANA Titer 1: 1:320 {titer} — ABNORMAL HIGH

## 2016-03-28 LAB — CYCLIC CITRUL PEPTIDE ANTIBODY, IGG: Cyclic Citrullin Peptide Ab: <16 Units

## 2016-03-28 LAB — ANGIOTENSIN CONVERTING ENZYME: Angiotensin-Converting Enzyme: 17 U/L (ref 9–67)

## 2016-03-28 LAB — ANA: Anti Nuclear Antibody(ANA): POSITIVE — AB

## 2016-03-28 NOTE — Addendum Note (Signed)
Addended by: Douglass Rivers T on: 03/28/2016 04:14 PM   Modules accepted: Orders

## 2016-04-01 ENCOUNTER — Ambulatory Visit (INDEPENDENT_AMBULATORY_CARE_PROVIDER_SITE_OTHER): Payer: Medicare Other | Admitting: Internal Medicine

## 2016-04-01 ENCOUNTER — Encounter: Payer: Self-pay | Admitting: Internal Medicine

## 2016-04-01 VITALS — BP 140/90 | HR 90 | Ht 65.5 in | Wt 216.0 lb

## 2016-04-01 DIAGNOSIS — G6289 Other specified polyneuropathies: Secondary | ICD-10-CM | POA: Diagnosis not present

## 2016-04-01 DIAGNOSIS — E1165 Type 2 diabetes mellitus with hyperglycemia: Secondary | ICD-10-CM

## 2016-04-01 LAB — POCT GLYCOSYLATED HEMOGLOBIN (HGB A1C): Hemoglobin A1C: 6.6

## 2016-04-01 LAB — VITAMIN B12: Vitamin B-12: >1500 pg/mL — ABNORMAL HIGH (ref 211–911)

## 2016-04-01 MED ORDER — GLUCOSE BLOOD VI STRP: ORAL_STRIP | 3 refills | Status: DC

## 2016-04-01 NOTE — Progress Notes (Signed)
Patient ID: Gloria [name], female   DOB: March 03, 1951, 65 y.o.   MRN: [id number]   HPI: Gloria [name] is a 65 y.o.-year-old female, referred by her PCP, Dr. Jenny Reichmann, for management of DM2, dx in 2015, non-insulin-dependent, uncontrolled, without complications.  Last hemoglobin A1c was: Lab Results  Component Value Date   HGBA1C 6.9 (H) 10/03/2015   HGBA1C 6.7 (H) 01/25/2015   HGBA1C 7.0 (H) 01/21/2014  She has asthma  - at change of season  - 2x a year >> Prednisone. Last course: 08/2015. Recent knee steroid inj's.   Pt is not on meds for DM2. She was on Metformin >> diarrhea. Stopped 6 month ago. Was on this 5-6 months.   Pt did not check her sugars in 6 month. - am: n/c - 2h after b'fast: n/c - before lunch: n/c - 2h after lunch: n/c - before dinner: n/c - 2h after dinner: n/c - bedtime: n/c - nighttime: n/c No lows; ? hypoglycemia awareness. Highest sugar was 200.  Glucometer: Prodigy (talking meter)  Pt's meals are: - Breakfast: oatmeal + berries or banana, coffee + creamer, OJ - Lunch: salad, hamburger - Dinner: fish, chicken - Snacks: 1 She stopped fresh fries, sweets. She drinks diet sodas. No exercise.  She has appt with Nutrition in few days.   - no CKD, last BUN/creatinine:  Lab Results  Component Value Date   BUN 14 10/03/2015   BUN 14 03/01/2015   CREATININE 0.76 10/03/2015   CREATININE 0.91 03/01/2015   - She does have hyperlipidemia; last set of lipids: Lab Results  Component Value Date   CHOL 140 10/03/2015   HDL 37.50 (L) 10/03/2015   LDLDIRECT 64.0 10/03/2015   TRIG 256.0 (H) 10/03/2015   CHOLHDL 4 10/03/2015   - last eye exam was in 06/2015. No DR. + cataracts B. - + numbness and tingling in her feet.  Pt has FH of DM in M uncles, MGM, cousins.  She also has a history of hypertension  ROS: Constitutional: + weight gain, + fatigue, + hot flushes, + poor sleep Eyes: no blurry vision, no xerophthalmia ENT: no sore throat, no nodules palpated in  throat, no dysphagia/odynophagia, no hoarseness Cardiovascular: no CP/+ SOB/no palpitations/+ leg swelling Respiratory: no cough/+ SOB Gastrointestinal: no N/V/D/C, + wheezing Musculoskeletal: + muscle/+ joint aches Skin: no rashes, + excessive hair growth Neurological: no tremors/numbness/tingling/dizziness Psychiatric: no depression/+ anxiety + Low libido  Past Medical History:  Diagnosis Date  . Allergic rhinitis, cause unspecified 03/30/2013  . Anxiety state, unspecified   . Arthritis   . Asthma   . Borderline diabetes mellitus    a1c 7.0 (07/26/13)   . Chronic bronchitis   . Depressive disorder, not elsewhere classified   . Diabetes mellitus without complication ([place])   . Difficulty waking    hard to wake up past sedation!  . Migraine, unspecified, without mention of intractable migraine without mention of status migrainosus   . Other and unspecified hyperlipidemia   . Panic attacks   . Postmenopausal symptoms   . Unspecified essential hypertension   . Unspecified urinary incontinence   . UTI (lower urinary tract infection)    on 02/05/16/on meds   Past Surgical History:  Procedure Laterality Date  . ABDOMINAL HYSTERECTOMY  2002  . DIAGNOSTIC LAPAROSCOPY    . dislocated shoulder     right  . tumor right ankle     Social History   Occupational History  . Self employed-interior design/flower arrangements    Social History  Main Topics  . Smoking status: Never Smoker  . Smokeless tobacco: Never Used  . Alcohol use No  . Drug use: No   Social History Narrative   -Retired - does Company secretary work   -Divorced in 2007   -One son - lives locally. 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 and entertain family. Bowling. Interior decorating   Current Outpatient Prescriptions on File Prior to Visit  Medication Sig Dispense Refill  . albuterol (PROAIR HFA) 108 (90 BASE) MCG/ACT inhaler Inhale 2 puffs into the lungs every 4 (four)  hours as needed for wheezing (((PLAN B))).    Marland Kitchen albuterol (PROVENTIL) (2.5 MG/3ML) 0.083% nebulizer solution Inhale 3 mLs (2.5 mg total) into the lungs every 4 (four) hours as needed for wheezing or shortness of breath (((PLAN C))). 75 mL 2  . ALPRAZolam (XANAX) 1 MG tablet Take 1 mg by mouth 2 (two) times daily as needed for anxiety.     . budesonide-formoterol (SYMBICORT) 160-4.5 MCG/ACT inhaler Inhale 2 puffs into the lungs 2 (two) times daily.    . celecoxib (CELEBREX) 200 MG capsule Take 1 capsule (200 mg total) by mouth 2 (two) times daily as needed. 180 capsule 3  . Cholecalciferol (VITAMIN D3) 5000 UNITS CAPS Take 1 capsule by mouth every morning.    Marland Kitchen dextromethorphan-guaiFENesin (MUCINEX DM) 30-600 MG per 12 hr tablet Take 1 tablet by mouth 2 (two) times daily as needed (w/ flutter valve for cough and congestion).     . fluticasone (FLONASE) 50 MCG/ACT nasal spray Place 2 sprays into both nostrils daily as needed for allergies or rhinitis. 1 g 11  . furosemide (LASIX) 20 MG tablet Take 1 tablet (20 mg total) by mouth daily. (Patient taking differently: Take 20 mg by mouth as needed. ) 90 tablet 1  . HYDROcodone-acetaminophen (NORCO/VICODIN) 5-325 MG tablet Take 1-2 tablets by mouth every 4 (four) hours as needed (if still coughing). 40 tablet 0  . irbesartan (AVAPRO) 150 MG tablet TAKE 1 TABLET(150 MG) BY MOUTH DAILY 90 tablet 0  . Multiple Vitamin (MULTIVITAMIN) tablet Take 1 tablet by mouth daily. Woman's MVI-TAke one daily    . omeprazole (PRILOSEC) 40 MG capsule TAKE 1 CAPSULE BY MOUTH DAILY 90 capsule 2  . ondansetron (ZOFRAN) 4 MG tablet Take 4 mg by mouth every 8 (eight) hours as needed for nausea or vomiting.    . potassium chloride (K-DUR) 10 MEQ tablet Take 1 tablet (10 mEq total) by mouth daily. (Patient taking differently: Take 10 mEq by mouth every morning. ) 90 tablet 3  . Respiratory Therapy Supplies (FLUTTER) DEVI Use as directed 1 each 0  . tiZANidine (ZANAFLEX) 4 MG tablet  Take 1 tablet (4 mg total) by mouth Nightly. 30 tablet 2  . Vitamin D, Ergocalciferol, (DRISDOL) 50000 units CAPS capsule Take 1 capsule (50,000 Units total) by mouth every 7 (seven) days. 12 capsule 0   No current facility-administered medications on file prior to visit.    Allergies  Allergen Reactions  . Compazine Other (See Comments)    Stroke like symptoms  . Haloperidol Decanoate Other (See Comments)    Extrapyramidal syndrome  . Penicillins Nausea And Vomiting  . Metformin And Related Other (See Comments)    GI upset  . Codeine Itching  . Lisinopril Cough   Family History  Problem Relation Age of Onset  . Arthritis      family history  .  Hypertension      grandparent  . Colon cancer Maternal Grandfather   . Asthma Son   . Rheum arthritis Mother   . Hypertension Mother   . Diabetes Maternal Uncle   . Diabetes Paternal Uncle   . Diabetes Maternal Grandmother    PE: BP 140/90 (BP Location: Left Arm, Patient Position: Sitting)   Pulse 90   Ht 5' 5.5" (1.664 m)   Wt 216 lb (98 kg)   SpO2 97%   BMI 35.40 kg/m  Wt Readings from Last 3 Encounters:  04/01/16 216 lb (98 kg)  03/27/16 218 lb (98.9 kg)  02/28/16 209 lb (94.8 kg)   Constitutional: Obese, in NAD Eyes: PERRLA, EOMI, no exophthalmos ENT: moist mucous membranes, no thyromegaly, no cervical lymphadenopathy Cardiovascular: RRR, No MRG Respiratory: CTA B Gastrointestinal: abdomen soft, NT, ND, BS+ Musculoskeletal: no deformities, strength intact in all 4 Skin: moist, warm, no rashes Neurological: no tremor with outstretched hands, DTR normal in all 4  ASSESSMENT: 1. DM2, non-insulin-dependent, uncontrolled, without long term complications, but with hyperglycemia  2. Peripheral neuropathy  PLAN:  1. Patient with long-standing, controlled, diet-diabetes. At this visit, her HbA1c is 6.6% (lower). Unfortunately, I do not have recent blood sugar checks; per review of the chart her sugars were 149 and 154 a  month ago. -There is no need to start medication for now. We did discuss about improving her diet, and she has an open with nutrition coming up. - She had severe diarrhea from metformin and would not want to restart that. Fortunately, there is no need for starting it now.  - I suggested to:  Patient Instructions  Please stay off Metformin for now.  Please go to see nutrition as scheduled.  Please stop at the lab.  Please return in 3 months with your sugar log.   - Strongly advised her to start checking sugars at different times of the day - check 1 times a day, rotating checks - given sugar log and advised how to fill it and to bring it at next appt  - given foot care handout and explained the principles  - given instructions for hypoglycemia management "15-15 rule"  - advised for yearly eye exams  - Return to clinic in 3 mo with sugar log   2. PN - Patient has numbness and tingling in her feet, out of proportion with her diabetes control. She never had very uncontrolled diabetes. I doubt that her neuropathy is related to her diabetes, therefore, I will check a B12 vitamin to see if this is low. I reviewed previous workup for this performed by PCP: Ferritin is normal, however, her iron was low, and she tells me that she is set up for iron infusions. Her vitamin D was slightly low, she was started on replacement. Her thyroid tests were normal.Her ESR was high and her ANA antibodies were positive. She was referred to rheumatology. - We discussed that if her vitamin B12 is normal, I would suggest to see a neurologist regarding her neuropathy  Office Visit on 04/01/2016  Component Date Value Ref Range Status  . Vitamin B-12 04/01/2016 >1500* 211 - 911 pg/mL Final  . Hemoglobin A1C 04/01/2016 6.6   Final  Vitamin D is not low.  Philemon Kingdom, MD PhD [facility]

## 2016-04-01 NOTE — Patient Instructions (Signed)
Please stay off Metformin for now.  Please go to see nutrition as scheduled.  Please stop at the lab.  Please return in 3 months with your sugar log.   PATIENT INSTRUCTIONS FOR TYPE 2 DIABETES:  **Please join MyChart!** - see attached instructions about how to join if you have not done so already.  DIET AND EXERCISE Diet and exercise is an important part of diabetic treatment.  We recommended aerobic exercise in the form of brisk walking (working between 40-60% of maximal aerobic capacity, similar to brisk walking) for 150 minutes per week (such as 30 minutes five days per week) along with 3 times per week performing 'resistance' training (using various gauge rubber tubes with handles) 5-10 exercises involving the major muscle groups (upper body, lower body and core) performing 10-15 repetitions (or near fatigue) each exercise. Start at half the above goal but build slowly to reach the above goals. If limited by weight, joint pain, or disability, we recommend daily walking in a swimming pool with water up to waist to reduce pressure from joints while allow for adequate exercise.    BLOOD GLUCOSES Monitoring your blood glucoses is important for continued management of your diabetes. Please check your blood glucoses 2-4 times a day: fasting, before meals and at bedtime (you can rotate these measurements - e.g. one day check before the 3 meals, the next day check before 2 of the meals and before bedtime, etc.).   HYPOGLYCEMIA (low blood sugar) Hypoglycemia is usually a reaction to not eating, exercising, or taking too much insulin/ other diabetes drugs.  Symptoms include tremors, sweating, hunger, confusion, headache, etc. Treat IMMEDIATELY with 15 grams of Carbs: . 4 glucose tablets .  cup regular juice/soda . 2 tablespoons raisins . 4 teaspoons sugar . 1 tablespoon honey Recheck blood glucose in 15 mins and repeat above if still symptomatic/blood glucose <100.  RECOMMENDATIONS TO REDUCE  YOUR RISK OF DIABETIC COMPLICATIONS: * Take your prescribed MEDICATION(S) * Follow a DIABETIC diet: Complex carbs, fiber rich foods, (monounsaturated and polyunsaturated) fats * AVOID saturated/trans fats, high fat foods, >2,300 mg salt per day. * EXERCISE at least 5 times a week for 30 minutes or preferably daily.  * DO NOT SMOKE OR DRINK more than 1 drink a day. * Check your FEET every day. Do not wear tightfitting shoes. Contact us if you develop an ulcer * See your EYE doctor once a year or more if needed * Get a FLU shot once a year * Get a PNEUMONIA vaccine once before and once after age 62 years  GOALS:  * Your Hemoglobin A1c of <7%  * fasting sugars need to be <130 * after meals sugars need to be <180 (2h after you start eating) * Your Systolic BP should be [id number] or lower  * Your Diastolic BP should be 80 or lower  * Your HDL (Good Cholesterol) should be 40 or higher  * Your LDL (Bad Cholesterol) should be 100 or lower. * Your Triglycerides should be 150 or lower  * Your Urine microalbumin (kidney function) should be <30 * Your Body Mass Index should be 25 or lower    Please consider the following ways to cut down carbs and fat and increase fiber and micronutrients in your diet: - substitute whole grain for white bread or pasta - substitute brown rice for white rice - substitute 90-calorie flat bread pieces for slices of bread when possible - substitute sweet potatoes or yams for white potatoes - substitute  humus for margarine - substitute tofu for cheese when possible - substitute almond or rice milk for regular milk (would not drink soy milk daily due to concern for soy estrogen influence on breast cancer risk) - substitute dark chocolate for other sweets when possible - substitute water - can add lemon or orange slices for taste - for diet sodas (artificial sweeteners will trick your body that you can eat sweets without getting calories and will lead you to overeating and  weight gain in the long run) - do not skip breakfast or other meals (this will slow down the metabolism and will result in more weight gain over time)  - can try smoothies made from fruit and almond/rice milk in am instead of regular breakfast - can also try old-fashioned (not instant) oatmeal made with almond/rice milk in am - order the dressing on the side when eating salad at a restaurant (pour less than half of the dressing on the salad) - eat as little meat as possible - can try juicing, but should not forget that juicing will get rid of the fiber, so would alternate with eating raw veg./fruits or drinking smoothies - use as little oil as possible, even when using olive oil - can dress a salad with a mix of balsamic vinegar and lemon juice, for e.g. - use agave nectar, stevia sugar, or regular sugar rather than artificial sweateners - steam or broil/roast veggies  - snack on veggies/fruit/nuts (unsalted, preferably) when possible, rather than processed foods - reduce or eliminate aspartame in diet (it is in diet sodas, chewing gum, etc) Read the labels!  Try to read Dr. Janene Harvey book: "Program for Reversing Diabetes" for other ideas for healthy eating.

## 2016-04-02 ENCOUNTER — Telehealth: Payer: Self-pay

## 2016-04-02 NOTE — Telephone Encounter (Signed)
Called patient and gave lab results. Patient had no questions or concerns.  

## 2016-04-04 ENCOUNTER — Encounter: Payer: Medicare Other | Attending: Internal Medicine | Admitting: *Deleted

## 2016-04-04 ENCOUNTER — Encounter: Payer: Self-pay | Admitting: *Deleted

## 2016-04-04 DIAGNOSIS — E1165 Type 2 diabetes mellitus with hyperglycemia: Secondary | ICD-10-CM | POA: Insufficient documentation

## 2016-04-04 DIAGNOSIS — E119 Type 2 diabetes mellitus without complications: Secondary | ICD-10-CM

## 2016-04-04 NOTE — Progress Notes (Signed)
Diabetes Self-Management Education  Visit Type: First/Initial  Appt. Start Time: 0800 Appt. End Time: 0900  04/04/2016  Ms. Gloria [name], identified by name and date of birth, is a 65 y.o. female with a diagnosis of Diabetes: Type 2.   ASSESSMENT       Diabetes Self-Management Education - 04/04/16 0808      Visit Information   Visit Type First/Initial     Initial Visit   Diabetes Type Type 2   Are you currently following a meal plan? No   Are you taking your medications as prescribed? No   Date Diagnosed recently     Health Coping   How would you rate your overall health? Good     Psychosocial Assessment   Patient Belief/Attitude about Diabetes Denial   Self-care barriers None   Other persons present Patient   Patient Concerns Nutrition/Meal planning   Special Needs None   Preferred Learning Style Auditory;Visual   Learning Readiness Contemplating   How often do you need to have someone help you when you read instructions, pamphlets, or other written materials from your doctor or pharmacy? 1 - Never   What is the last grade level you completed in school? college     Pre-Education Assessment   Patient understands the diabetes disease and treatment process. Needs Instruction   Patient understands incorporating nutritional management into lifestyle. Needs Instruction   Patient undertands incorporating physical activity into lifestyle. Needs Instruction   Patient understands using medications safely. Needs Instruction   Patient understands monitoring blood glucose, interpreting and using results Needs Instruction   Patient understands prevention, detection, and treatment of acute complications. Needs Instruction   Patient understands prevention, detection, and treatment of chronic complications. Needs Instruction   Patient understands how to develop strategies to address psychosocial issues. Needs Instruction   Patient understands how to develop strategies to promote  health/change behavior. Needs Instruction     Complications   Last HgB A1C per patient/outside source 6.6 %   How often do you check your blood sugar? 0 times/day (not testing)   Have you had a dilated eye exam in the past 12 months? Yes   Have you had a dental exam in the past 12 months? Yes   Are you checking your feet? Yes   How many days per week are you checking your feet? 1     Dietary Intake   Breakfast oatmeal, blueberries, coffee   Lunch salad   Snack (afternoon) pear   Dinner pork chop sandwich   Beverage(s) water     Exercise   Exercise Type ADL's     Patient Education   Previous Diabetes Education No   Disease state  Definition of diabetes, type 1 and 2, and the diagnosis of diabetes;Factors that contribute to the development of diabetes;Explored patient's options for treatment of their diabetes   Nutrition management  Role of diet in the treatment of diabetes and the relationship between the three main macronutrients and blood glucose level;Food label reading, portion sizes and measuring food.;Carbohydrate counting;Information on hints to eating out and maintain blood glucose control.   Physical activity and exercise  Role of exercise on diabetes management, blood pressure control and cardiac health.;Helped patient identify appropriate exercises in relation to his/her diabetes, diabetes complications and other health issue.   Monitoring Purpose and frequency of SMBG.;Taught/discussed recording of test results and interpretation of SMBG.;Daily foot exams;Yearly dilated eye exam;Identified appropriate SMBG and/or A1C goals.   Acute complications Taught treatment of hypoglycemia -  the 15 rule.;Discussed and identified patients' treatment of hyperglycemia.;Covered sick day management with medication and food.   Chronic complications Relationship between chronic complications and blood glucose control   Psychosocial adjustment Role of stress on diabetes;Travel strategies      Individualized Goals (developed by patient)   Nutrition General guidelines for healthy choices and portions discussed   Physical Activity Exercise 1-2 times per week   Reducing Risk examine blood glucose patterns     Post-Education Assessment   Patient understands the diabetes disease and treatment process. Needs Review   Patient understands incorporating nutritional management into lifestyle. Needs Review   Patient undertands incorporating physical activity into lifestyle. Needs Review   Patient understands using medications safely. Needs Review   Patient understands monitoring blood glucose, interpreting and using results Needs Review   Patient understands prevention, detection, and treatment of acute complications. Needs Review   Patient understands prevention, detection, and treatment of chronic complications. Needs Review   Patient understands how to develop strategies to address psychosocial issues. Needs Review   Patient understands how to develop strategies to promote health/change behavior. Needs Review     Outcomes   Expected Outcomes Demonstrated interest in learning. Expect positive outcomes   Future DMSE PRN   Program Status Completed      Individualized Plan for Diabetes Self-Management Training:   Learning Objective:  Patient will have a greater understanding of diabetes self-management. Patient education plan is to attend individual and/or group sessions per assessed needs and concerns.   Plan:   There are no Patient Instructions on file for this visit.  Expected Outcomes:  Demonstrated interest in learning. Expect positive outcomes  Education material provided: Living Well with Diabetes and Meal plan card  If problems or questions, patient to contact team via:  Phone  Future DSME appointment: PRN

## 2016-04-15 NOTE — Progress Notes (Signed)
[name] [place], [name] [address] Phone: [phone] Subjective:    I'm seeing this patient by the request  of:  Cathlean Cower, MD   CC: Knees, ankles and back pain f/u   [id number]  Gloria [name] is a 65 y.o. female coming in with complaint of Multiple muscle and joint pain. Patient was found to have significant bilateral knee pain at last follow-up. States that they were making significantly worsening pain. Was given injections in 3 weeks ago. Patient states No significant improvement at this time. Continuing to have pain overall. States that this is very difficulty with walking on a regular basis. Affecting daily activities. Feels that her back as well as her shoulders and many other joint still hurt. States that the pain is unbearable.    patient was also found to have a severely positive ANA. Patient has been referred to rheumatology. Has not seen anyone at this time.  Past Medical History:  Diagnosis Date  . Allergic rhinitis, cause unspecified 03/30/2013  . Anxiety state, unspecified   . Arthritis   . Asthma   . Borderline diabetes mellitus    a1c 7.0 (07/26/13)   . Chronic bronchitis   . Depressive disorder, not elsewhere classified   . Diabetes mellitus without complication ([place])   . Difficulty waking    hard to wake up past sedation!  . Migraine, unspecified, without mention of intractable migraine without mention of status migrainosus   . Other and unspecified hyperlipidemia   . Panic attacks   . Postmenopausal symptoms   . Unspecified essential hypertension   . Unspecified urinary incontinence   . UTI (lower urinary tract infection)    on 02/05/16/on meds   Past Surgical History:  Procedure Laterality Date  . ABDOMINAL HYSTERECTOMY  2002  . DIAGNOSTIC LAPAROSCOPY    . dislocated shoulder     right  . tumor right ankle     Social History   Social History  . Marital status: Divorced    Spouse name: N/A  . Number  of children: 1  . Years of education: N/A   Occupational History  . Self employed-interior design/flower arrangements    Social History Main Topics  . Smoking status: Never Smoker  . Smokeless tobacco: Never Used  . Alcohol use No  . Drug use: No  . Sexual activity: Not Currently   Other Topics Concern  . None   Social History Narrative   -Retired - does Company secretary   -Divorced in 2007   -One son - lives locally. 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 and entertain family. Bowling. Interior decorating         Allergies  Allergen Reactions  . Compazine Other (See Comments)    Stroke like symptoms  . Haloperidol Decanoate Other (See Comments)    Extrapyramidal syndrome  . Penicillins Nausea And Vomiting  . Metformin And Related Other (See Comments)    GI upset  . Codeine Itching  . Lisinopril Cough   Family History  Problem Relation Age of Onset  . Arthritis      family history  . Hypertension      grandparent  . Colon cancer Maternal Grandfather   . Asthma Son   . Rheum arthritis Mother   . Hypertension Mother   . Diabetes Maternal Uncle   . Diabetes Paternal Uncle   . Diabetes Maternal Grandmother  Review of Systems: Pan positive of all systems.   Objective  Blood pressure 128/84, pulse 90, weight 210 lb (95.3 kg), SpO2 97 %.  General: No apparent distress alert and oriented x3 mood and affect normal, dressed appropriately.  HEENT: Pupils equal, extraocular movements intact  Respiratory: Patient's speak in full sentences and does not appear short of breath  Cardiovascular: No lower extremity edema, non tender, no erythema  Skin: Warm dry intact with no signs of infection or rash on extremities or on axial skeleton.  Abdomen: Soft nontender  Neuro: Cranial nerves II through XII are intact, neurovascularly intact in all extremities with 2+ DTRs and 2+ pulses.  Lymph: No lymphadenopathy of posterior or anterior  cervical chain or axillae bilaterally.  Gait Antalgic gait MSK:  Multiple joint pains. Patient has multiple tenderness to palpation on muscles as well as joints. Greater than 20 points. Knees bilaterally has some mild valgus deformity. No significant instability. Increasing crepitus with range of motion. Patient is tender to palpation over the medial joint line. No effusion noted. Full range of motion. Neurovascular intact distally.       Impression and Recommendations:     This case required medical decision making of moderate complexity.      Note: This dictation was prepared with Dragon dictation along with smaller phrase technology. Any transcriptional errors that result from this process are unintentional.

## 2016-04-16 ENCOUNTER — Ambulatory Visit (INDEPENDENT_AMBULATORY_CARE_PROVIDER_SITE_OTHER): Payer: Medicare Other | Admitting: Family Medicine

## 2016-04-16 ENCOUNTER — Encounter: Payer: Self-pay | Admitting: Family Medicine

## 2016-04-16 DIAGNOSIS — M170 Bilateral primary osteoarthritis of knee: Secondary | ICD-10-CM

## 2016-04-16 DIAGNOSIS — M2550 Pain in unspecified joint: Secondary | ICD-10-CM

## 2016-04-16 NOTE — Assessment & Plan Note (Signed)
Do believe with patients positive ANA likely autoimmune diseases the primary problem. Discussed with patient about the importance of going to a rheumatologist for further evaluation. Patient will call today. Referral has been in the computer for quite some time. We discussed that there are different medications a could be beneficial. Encourage her to try the over-the-counter medications. Continue with the once weekly vitamin D. Follow-up again more on an as-needed basis.

## 2016-04-16 NOTE — Assessment & Plan Note (Signed)
Encourage weight loss. 

## 2016-04-16 NOTE — Patient Instructions (Signed)
Good to see you  I do think a lot of your pain is autoimmune disease.  Rheumatology should be calling you and they will be the next step. Dr. Bennie Dallas at [facility] [phone] If you decide you like to try the injection then give me a call Otherwise I am here if you have questions.

## 2016-04-16 NOTE — Assessment & Plan Note (Signed)
No improvement with bilateral injections. We did discuss the possibility of viscous supplementation which patient declined. Discussed with patient that if she would like to try this or formal physical therapy that patient can come back.

## 2016-04-25 ENCOUNTER — Other Ambulatory Visit: Payer: Self-pay | Admitting: Internal Medicine

## 2016-04-25 ENCOUNTER — Telehealth: Payer: Self-pay | Admitting: Internal Medicine

## 2016-04-25 MED ORDER — BUDESONIDE-FORMOTEROL FUMARATE 160-4.5 MCG/ACT IN AERO: 2.0000 | INHALATION_SPRAY | Freq: Two times a day (BID) | RESPIRATORY_TRACT | 5 refills | Status: DC

## 2016-04-25 NOTE — Telephone Encounter (Signed)
Called and spoke to pt. Pt is requesting a refill of Symbicort 160, rx sent to preferred pharmacy. Pt verbalized understanding and denied any further questions or concerns at this time.

## 2016-04-26 ENCOUNTER — Encounter: Payer: Self-pay | Admitting: Cardiovascular Disease

## 2016-04-26 ENCOUNTER — Ambulatory Visit (INDEPENDENT_AMBULATORY_CARE_PROVIDER_SITE_OTHER): Payer: Medicare Other | Admitting: Cardiovascular Disease

## 2016-04-26 VITALS — BP 169/100 | HR 89 | Ht 65.0 in | Wt 221.8 lb

## 2016-04-26 DIAGNOSIS — R079 Chest pain, unspecified: Secondary | ICD-10-CM | POA: Diagnosis not present

## 2016-04-26 DIAGNOSIS — R0602 Shortness of breath: Secondary | ICD-10-CM | POA: Diagnosis not present

## 2016-04-26 DIAGNOSIS — R0789 Other chest pain: Secondary | ICD-10-CM | POA: Diagnosis not present

## 2016-04-26 DIAGNOSIS — I10 Essential (primary) hypertension: Secondary | ICD-10-CM

## 2016-04-26 NOTE — Patient Instructions (Addendum)
Medication Instructions:  Your physician recommends that you continue on your current medications as directed. Please refer to the Current Medication list given to you today.  Labwork: none  Testing/Procedures: Your physician has requested that you have a lexiscan myoview. For further information please visit [email or web address]. Please follow instruction sheet, as given. 2 DAY STUDY  Follow-Up: Your physician recommends that you schedule a follow-up appointment in: 1 month ov  If you need a refill on your cardiac medications before your next appointment, please call your pharmacy.

## 2016-04-26 NOTE — Progress Notes (Signed)
Cardiology Office Note   Date:  04/28/2016   ID:  Gloria [name], DOB [birth year], MRN [id number]  PCP:  Cathlean Cower, MD  Cardiologist:   Skeet Latch, MD   Chief Complaint  Patient presents with  . New Patient (Initial Visit)    sob; when exerting self.       History of Present Illness: Gloria [name] is a 65 y.o. female with hypertension, hyperlipidemia, diabetes, and morbid obesity who presents for evaluation of an abnormal echo.  Gloria [name] had an echo 03/21/16 that revealed LVEF 50% with grade 2 diastolic dysfunction. There was also mild mitral regurgitation. It was recommended that she have a limited echo with Definity contrast to better evaluate for regional wall motion.  The echo was initially performed due to shortness of breath.  She notes shortness of breath that has gotten worse over years.  She notes it with walking but not at rest.  She also notes that her blood pressure increases with exertion.  Gloria [name] does not get any formal exercise.  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  She denies chest pain with exertion.  Gloria [name] has been dealing with a lot of stress and has been feeling anxious.  Several family members have died 58 the last couple of years.  She sometimes has chest pressure when she is upset but not with exertion.  She constantly feels tired and doesn't sleep well.  She has pain and cramps in her legs when laying down.  She has been prescribed gabapentin but hasn't taken it due to fear of potential side effects.  She also notes tingling in her toes that has been ongoing for one year.    Gloria [name] does sometimes check her blood presure and it is generally well-controlled and in the 130s/80s.  She gets mld swelling in her left foot and gets a good response to lasix.  She denies orthopnea or PND.    Past Medical History:  Diagnosis Date  . Allergic rhinitis, cause unspecified 03/30/2013  . Anxiety state, unspecified   . Arthritis   . Asthma     . Borderline diabetes mellitus    a1c 7.0 (07/26/13)   . Chronic bronchitis   . Depressive disorder, not elsewhere classified   . Diabetes mellitus without complication ([place])   . Difficulty waking    hard to wake up past sedation!  . Migraine, unspecified, without mention of intractable migraine without mention of status migrainosus   . Other and unspecified hyperlipidemia   . Panic attacks   . Postmenopausal symptoms   . Unspecified essential hypertension   . Unspecified urinary incontinence   . UTI (lower urinary tract infection)    on 02/05/16/on meds    Past Surgical History:  Procedure Laterality Date  . ABDOMINAL HYSTERECTOMY  2002  . DIAGNOSTIC LAPAROSCOPY    . dislocated shoulder     right  . tumor right ankle       Current Outpatient Prescriptions  Medication Sig Dispense Refill  . albuterol (PROAIR HFA) 108 (90 BASE) MCG/ACT inhaler Inhale 2 puffs into the lungs every 4 (four) hours as needed for wheezing (((PLAN B))).    Marland Kitchen albuterol (PROVENTIL) (2.5 MG/3ML) 0.083% nebulizer solution Inhale 3 mLs (2.5 mg total) into the lungs every 4 (four) hours as needed for wheezing or shortness of breath (((PLAN C))). 75 mL 2  . ALPRAZolam (XANAX) 1 MG tablet Take 1 mg by mouth 2 (two) times daily  as needed for anxiety.     . budesonide-formoterol (SYMBICORT) 160-4.5 MCG/ACT inhaler Inhale 2 puffs into the lungs 2 (two) times daily. 1 Inhaler 5  . celecoxib (CELEBREX) 200 MG capsule Take 1 capsule (200 mg total) by mouth 2 (two) times daily as needed. 180 capsule 3  . Cholecalciferol (VITAMIN D3) 5000 UNITS CAPS Take 1 capsule by mouth every morning.    Marland Kitchen dextromethorphan-guaiFENesin (MUCINEX DM) 30-600 MG per 12 hr tablet Take 1 tablet by mouth 2 (two) times daily as needed (w/ flutter valve for cough and congestion).     . fluticasone (FLONASE) 50 MCG/ACT nasal spray Place 2 sprays into both nostrils daily as needed for allergies or rhinitis. 1 g 11  . furosemide (LASIX) 20 MG  tablet Take 1 tablet (20 mg total) by mouth daily. (Patient taking differently: Take 20 mg by mouth as needed. ) 90 tablet 1  . glucose blood (ACCU-CHEK AVIVA) test strip Use 1x a day 100 each 3  . HYDROcodone-acetaminophen (NORCO/VICODIN) 5-325 MG tablet Take 1-2 tablets by mouth every 4 (four) hours as needed (if still coughing). 40 tablet 0  . irbesartan (AVAPRO) 150 MG tablet TAKE 1 TABLET(150 MG) BY MOUTH DAILY 90 tablet 0  . Multiple Vitamin (MULTIVITAMIN) tablet Take 1 tablet by mouth daily. Woman's MVI-TAke one daily    . omeprazole (PRILOSEC) 40 MG capsule TAKE 1 CAPSULE BY MOUTH DAILY 90 capsule 2  . ondansetron (ZOFRAN) 4 MG tablet Take 4 mg by mouth every 8 (eight) hours as needed for nausea or vomiting.    . potassium chloride (K-DUR) 10 MEQ tablet Take 1 tablet (10 mEq total) by mouth daily. (Patient taking differently: Take 10 mEq by mouth every morning. ) 90 tablet 3  . Respiratory Therapy Supplies (FLUTTER) DEVI Use as directed 1 each 0  . Vitamin D, Ergocalciferol, (DRISDOL) 50000 units CAPS capsule Take 1 capsule (50,000 Units total) by mouth every 7 (seven) days. 12 capsule 0   No current facility-administered medications for this visit.     Allergies:   Compazine; Haloperidol decanoate; Penicillins; Metformin and related; Codeine; and Lisinopril    Social History:  The patient  reports that she has never smoked. She has never used smokeless tobacco. She reports that she does not drink alcohol or use drugs.   Family History:  The patient's family history includes Asthma in her son; Colon cancer in her maternal grandfather; Diabetes in her maternal grandmother, maternal uncle, and paternal uncle; Hypertension in her mother; Rheum arthritis in her mother.    ROS:  Please see the history of present illness.   Otherwise, review of systems are positive for none.   All other systems are reviewed and negative.    PHYSICAL EXAM: VS:  BP (!) 169/100   Pulse 89   Ht 5\' 5"  (1.651  m)   Wt 100.6 kg (221 lb 12.8 oz)   BMI 36.91 kg/m  , BMI Body mass index is 36.91 kg/m. GENERAL:  Well appearing HEENT:  Pupils equal round and reactive, fundi not visualized, oral mucosa unremarkable NECK:  No jugular venous distention, waveform within normal limits, carotid upstroke brisk and symmetric, no bruits, no thyromegaly LYMPHATICS:  No cervical adenopathy LUNGS:  Clear to auscultation bilaterally HEART:  RRR.  PMI not displaced or sustained,S1 and S2 within normal limits, no S3, no S4, no clicks, no rubs, no murmurs ABD:  Flat, positive bowel sounds normal in frequency in pitch, no bruits, no rebound, no guarding, no  midline pulsatile mass, no hepatomegaly, no splenomegaly EXT:  2 plus pulses throughout, no edema, no cyanosis no clubbing SKIN:  No rashes no nodules NEURO:  Cranial nerves II through XII grossly intact, motor grossly intact throughout PSYCH:  Cognitively intact, oriented to person place and time    EKG:  EKG is ordered today. The ekg ordered today demonstrates sinus rhythm rate 89 bpm.  Non-specific ST-T changes.  Echo 03/21/16: Study Conclusions  - Left ventricle: Global longitudinal strain is -14.3% The cavity   size was mildly dilated. Wall thickness was normal. Systolic   function was normal. The estimated ejection fraction was in the   range of 50%. Features are consistent with a pseudonormal left   ventricular filling pattern, with concomitant abnormal relaxation   and increased filling pressure (grade 2 diastolic dysfunction). - Mitral valve: There was mild regurgitation. - Left atrium: The atrium was mildly dilated.  Impressions:  - Would recomm limited echo with Definity use to further evaluate   regional wall motion.   Recent Labs: 10/03/2015: ALT 14; BUN 14; Creatinine, Ser 0.76; Potassium 3.7; Sodium 137 03/27/2016: Hemoglobin 11.4; Platelets 287.0; TSH 3.26    Lipid Panel    Component Value Date/Time   CHOL 140 10/03/2015 1634    TRIG 256.0 (H) 10/03/2015 1634   TRIG 558 01/25/2010   HDL 37.50 (L) 10/03/2015 1634   CHOLHDL 4 10/03/2015 1634   VLDL 51.2 (H) 10/03/2015 1634   LDLDIRECT 64.0 10/03/2015 1634      Wt Readings from Last 3 Encounters:  04/26/16 100.6 kg (221 lb 12.8 oz)  04/16/16 95.3 kg (210 lb)  04/01/16 98 kg (216 lb)      ASSESSMENT AND PLAN:  # Shortness of breath: # Atypical chest pain: Symptoms are atypical.  We will get a Lexiscan Myoview to evaluate for ischemia.  She has no evidence of heart failure on exam or by history.   # Hypertension:  Ms. Rumler' blood pressure is poorly-controlled today.  On repeat it was 158/82.  I have asked her to keep a log of her blood pressure.  Continue irbesartan and lasix.    Current medicines are reviewed at length with the patient today.  The patient does not have concerns regarding medicines.  The following changes have been made:  no change  Labs/ tests ordered today include:   Orders Placed This Encounter  Procedures  . Myocardial Perfusion Imaging  . EKG 12-Lead     Disposition:   FU with Phoua Hoadley C. Oval Linsey, MD, [facility] in     This note was written with the assistance of speech recognition software.  Please excuse any transcriptional errors.  Signed, Alvena Kiernan C. Oval Linsey, MD, [facility]  04/28/2016 1:25 PM     [facility]

## 2016-05-08 ENCOUNTER — Telehealth: Payer: Self-pay | Admitting: Cardiovascular Disease

## 2016-05-08 NOTE — Telephone Encounter (Signed)
Called patient as she had called me to reschedule her stress test.  Left my name and number.  She should be scheduled as a one day as she is under 225 lbs.

## 2016-05-12 ENCOUNTER — Other Ambulatory Visit: Payer: Self-pay | Admitting: Internal Medicine

## 2016-05-14 ENCOUNTER — Telehealth (HOSPITAL_COMMUNITY): Payer: Self-pay

## 2016-05-14 NOTE — Telephone Encounter (Signed)
Encounter complete. 

## 2016-05-16 ENCOUNTER — Other Ambulatory Visit: Payer: Self-pay | Admitting: *Deleted

## 2016-05-16 ENCOUNTER — Ambulatory Visit (HOSPITAL_COMMUNITY)
Admission: RE | Admit: 2016-05-16 | Discharge: 2016-05-16 | Disposition: A | Discharge status: Home / Self Care | Payer: Medicare Other | Source: Ambulatory Visit | Attending: Cardiology | Admitting: Cardiology

## 2016-05-16 DIAGNOSIS — R079 Chest pain, unspecified: Secondary | ICD-10-CM

## 2016-05-16 DIAGNOSIS — R0602 Shortness of breath: Secondary | ICD-10-CM | POA: Insufficient documentation

## 2016-05-16 MED ORDER — IRBESARTAN 150 MG PO TABS: 150.0000 mg | ORAL_TABLET | Freq: Every day | ORAL | 1 refills | Status: DC

## 2016-05-16 MED ORDER — REGADENOSON 0.4 MG/5ML IV SOLN
0.4000 mg | Freq: Once | INTRAVENOUS | Status: AC
  Administered 2016-05-16: 0.4 mg via INTRAVENOUS

## 2016-05-16 MED ORDER — TECHNETIUM TC 99M TETROFOSMIN IV KIT
30.6000 | PACK | Freq: Once | INTRAVENOUS | Status: AC | PRN
  Administered 2016-05-16: 30.6 via INTRAVENOUS
  Filled 2016-05-16: qty 31

## 2016-05-16 NOTE — Telephone Encounter (Signed)
Irbesartan 150 mg prescription filled and sent to patient's pharmaacy on file.

## 2016-05-17 ENCOUNTER — Ambulatory Visit (HOSPITAL_COMMUNITY)
Admission: RE | Admit: 2016-05-17 | Discharge: 2016-05-17 | Disposition: A | Discharge status: Home / Self Care | Payer: Medicare Other | Source: Ambulatory Visit | Attending: Cardiovascular Disease | Admitting: Cardiovascular Disease

## 2016-05-17 LAB — MYOCARDIAL PERFUSION IMAGING
LV dias vol: 101 mL (ref 46–106)
LV sys vol: 55 mL
Peak HR: 116 {beats}/min
Rest HR: 108 {beats}/min
SDS: 5
SRS: 2
SSS: 7
TID: 0.87

## 2016-05-17 MED ORDER — REGADENOSON 0.4 MG/5ML IV SOLN: 0.4000 mg | Freq: Once | INTRAVENOUS | Status: DC

## 2016-05-17 MED ORDER — AMINOPHYLLINE 25 MG/ML IV SOLN
75.0000 mg | Freq: Once | INTRAVENOUS | Status: AC
  Administered 2016-05-16: 75 mg via INTRAVENOUS

## 2016-05-17 MED ORDER — TECHNETIUM TC 99M TETROFOSMIN IV KIT
30.1000 | PACK | Freq: Once | INTRAVENOUS | Status: AC | PRN
  Administered 2016-05-17: 30.1 via INTRAVENOUS

## 2016-05-20 ENCOUNTER — Telehealth: Payer: Self-pay | Admitting: Emergency Medicine

## 2016-05-20 NOTE — Telephone Encounter (Signed)
Pt called and wants to know if she can get a refill on her HYDROcodone-acetaminophen (NORCO/VICODIN) 5-325 MG tablet. Please advise thanks.

## 2016-05-21 ENCOUNTER — Encounter: Payer: Self-pay | Admitting: Nurse Practitioner

## 2016-05-21 ENCOUNTER — Ambulatory Visit (INDEPENDENT_AMBULATORY_CARE_PROVIDER_SITE_OTHER): Payer: Medicare Other | Admitting: Nurse Practitioner

## 2016-05-21 ENCOUNTER — Other Ambulatory Visit: Payer: Medicare Other

## 2016-05-21 VITALS — BP 142/72 | HR 110 | Temp 97.8°F | Wt 221.0 lb

## 2016-05-21 DIAGNOSIS — R300 Dysuria: Secondary | ICD-10-CM | POA: Diagnosis not present

## 2016-05-21 DIAGNOSIS — R81 Glycosuria: Secondary | ICD-10-CM

## 2016-05-21 LAB — POCT URINALYSIS DIPSTICK
Bilirubin, UA: NEGATIVE
Blood, UA: 10
Glucose, UA: NEGATIVE
Ketones, UA: NEGATIVE
Leukocytes, UA: NEGATIVE
Nitrite, UA: POSITIVE
Protein, UA: NEGATIVE
Spec Grav, UA: >=1.03
Urobilinogen, UA: 0.2
pH, UA: 6

## 2016-05-21 LAB — GLUCOSE, POCT (MANUAL RESULT ENTRY): POC Glucose: 150 mg/dl — AB (ref 70–99)

## 2016-05-21 MED ORDER — CIPROFLOXACIN HCL 250 MG PO TABS: 250.0000 mg | ORAL_TABLET | Freq: Two times a day (BID) | ORAL | 0 refills | Status: DC

## 2016-05-21 MED ORDER — HYDROCODONE-ACETAMINOPHEN 5-325 MG PO TABS: 1.0000 | ORAL_TABLET | ORAL | 0 refills | Status: DC | PRN

## 2016-05-21 NOTE — Patient Instructions (Signed)
Encourage adequate oral hydration.  You will be called with urine culture results. 

## 2016-05-21 NOTE — Progress Notes (Signed)
Pre visit review using our clinic review tool, if applicable. No additional management support is needed unless otherwise documented below in the visit note. 

## 2016-05-21 NOTE — Progress Notes (Signed)
Subjective:  Patient ID: Gloria [name], female    DOB: 1950/09/22  Age: 65 y.o. MRN: [id number]  CC: Urinary Tract Infection (fruit smelling urine, discolored)   Urinary Tract Infection   This is a new problem. The current episode started in the past 7 days. The problem occurs every urination. The problem has been unchanged. The quality of the pain is described as burning. There has been no fever. Gloria [name] is not sexually active. Associated symptoms include frequency and urgency. Pertinent negatives include no chills, discharge, flank pain, hematuria, hesitancy, nausea, sweats or vomiting. Gloria [name] has tried nothing for the symptoms. Her past medical history is significant for urinary stasis.  treated with keflex 08/2017for UTI caused by e.coli per urine culture.  DM: last Hgb a1c 6.6. Does not check glucose at home.  Outpatient Medications Prior to Visit  Medication Sig Dispense Refill  . albuterol (PROAIR HFA) 108 (90 BASE) MCG/ACT inhaler Inhale 2 puffs into the lungs every 4 (four) hours as needed for wheezing (((PLAN B))).    Marland Kitchen albuterol (PROVENTIL) (2.5 MG/3ML) 0.083% nebulizer solution Inhale 3 mLs (2.5 mg total) into the lungs every 4 (four) hours as needed for wheezing or shortness of breath (((PLAN C))). 75 mL 2  . ALPRAZolam (XANAX) 1 MG tablet Take 1 mg by mouth 2 (two) times daily as needed for anxiety.     . budesonide-formoterol (SYMBICORT) 160-4.5 MCG/ACT inhaler Inhale 2 puffs into the lungs 2 (two) times daily. 1 Inhaler 5  . celecoxib (CELEBREX) 200 MG capsule Take 1 capsule (200 mg total) by mouth 2 (two) times daily as needed. 180 capsule 3  . Cholecalciferol (VITAMIN D3) 5000 UNITS CAPS Take 1 capsule by mouth every morning.    Marland Kitchen dextromethorphan-guaiFENesin (MUCINEX DM) 30-600 MG per 12 hr tablet Take 1 tablet by mouth 2 (two) times daily as needed (w/ flutter valve for cough and congestion).     . fluticasone (FLONASE) 50 MCG/ACT nasal spray Place 2 sprays into both nostrils  daily as needed for allergies or rhinitis. 1 g 11  . furosemide (LASIX) 20 MG tablet Take 1 tablet (20 mg total) by mouth daily. (Patient taking differently: Take 20 mg by mouth as needed. ) 90 tablet 1  . glucose blood (ACCU-CHEK AVIVA) test strip Use 1x a day 100 each 3  . irbesartan (AVAPRO) 150 MG tablet Take 1 tablet (150 mg total) by mouth daily. 90 tablet 1  . Multiple Vitamin (MULTIVITAMIN) tablet Take 1 tablet by mouth daily. Woman's MVI-TAke one daily    . omeprazole (PRILOSEC) 40 MG capsule TAKE 1 CAPSULE BY MOUTH DAILY 90 capsule 2  . ondansetron (ZOFRAN) 4 MG tablet Take 4 mg by mouth every 8 (eight) hours as needed for nausea or vomiting.    . potassium chloride (K-DUR) 10 MEQ tablet Take 1 tablet (10 mEq total) by mouth daily. (Patient taking differently: Take 10 mEq by mouth every morning. ) 90 tablet 3  . Respiratory Therapy Supplies (FLUTTER) DEVI Use as directed 1 each 0  . Vitamin D, Ergocalciferol, (DRISDOL) 50000 units CAPS capsule Take 1 capsule (50,000 Units total) by mouth every 7 (seven) days. 12 capsule 0  . HYDROcodone-acetaminophen (NORCO/VICODIN) 5-325 MG tablet Take 1-2 tablets by mouth every 4 (four) hours as needed (if still coughing). 40 tablet 0   No facility-administered medications prior to visit.     ROS See HPI  Objective:  BP (!) 142/72 (BP Location: Right Arm, Patient Position: Sitting)   Pulse Marland Kitchen)  110   Temp 97.8 F (36.6 C) (Oral)   Wt 221 lb (100.2 kg)   SpO2 98%   BMI 36.78 kg/m   BP Readings from Last 3 Encounters:  05/21/16 (!) 142/72  04/26/16 (!) 169/100  04/16/16 128/84    Wt Readings from Last 3 Encounters:  05/21/16 221 lb (100.2 kg)  04/26/16 221 lb 12.8 oz (100.6 kg)  04/16/16 210 lb (95.3 kg)    Physical Exam  Constitutional: Gloria [name] is oriented to person, place, and time. No distress.  Cardiovascular: Normal rate.   Pulmonary/Chest: Effort normal.  Abdominal: Soft. Bowel sounds are normal. Gloria [name] exhibits no distension.  There is tenderness.  suprapubic  Neurological: Gloria [name] is alert and oriented to person, place, and time.  Vitals reviewed.   Lab Results  Component Value Date   WBC 12.8 (H) 03/27/2016   HGB 11.4 (L) 03/27/2016   HCT 33.7 (L) 03/27/2016   PLT 287.0 03/27/2016   GLUCOSE 103 (H) 10/03/2015   CHOL 140 10/03/2015   TRIG 256.0 (H) 10/03/2015   HDL 37.50 (L) 10/03/2015   LDLDIRECT 64.0 10/03/2015   ALT 14 10/03/2015   AST 14 10/03/2015   NA 137 10/03/2015   K 3.7 10/03/2015   CL 99 10/03/2015   CREATININE 0.76 10/03/2015   BUN 14 10/03/2015   CO2 29 10/03/2015   TSH 3.26 03/27/2016   HGBA1C 6.6 04/01/2016   MICROALBUR <0.7 10/03/2015    No results found.  Assessment & Plan:   Melaine was seen today for urinary tract infection.  Diagnoses and all orders for this visit:  Dysuria -     Cancel: Urine culture; Future -     ciprofloxacin (CIPRO) 250 MG tablet; Take 1 tablet (250 mg total) by mouth 2 (two) times daily.  Glucose found in urine on examination -     POCT urinalysis dipstick -     Urine Culture; Future -     Cancel: POCT CBG (Fasting - Glucose) -     POCT Glucose (CBG)   I am having Gloria [name] start on ciprofloxacin. I am also having her maintain her FLUTTER, dextromethorphan-guaiFENesin, albuterol, fluticasone, potassium chloride, Vitamin D3, ALPRAZolam, ondansetron, albuterol, furosemide, omeprazole, multivitamin, celecoxib, Vitamin D (Ergocalciferol), glucose blood, budesonide-formoterol, and irbesartan.  Meds ordered this encounter  Medications  . ciprofloxacin (CIPRO) 250 MG tablet    Sig: Take 1 tablet (250 mg total) by mouth 2 (two) times daily.    Dispense:  20 tablet    Refill:  0    Order Specific Question:   Supervising Provider    Answer:   Cassandria Anger [[id number]]    Follow-up: No Follow-up on file.  Wilfred Lacy, NP

## 2016-05-21 NOTE — Telephone Encounter (Signed)
Given to patient  

## 2016-05-21 NOTE — Telephone Encounter (Signed)
Done hardcopy to Corinne  

## 2016-05-21 NOTE — Progress Notes (Signed)
Reviewed with patient in office. See office note

## 2016-05-23 LAB — URINE CULTURE: Colony Count: >=100000

## 2016-06-13 NOTE — Progress Notes (Addendum)
Office Visit Note  Patient: Gloria [name]             Date of Birth: March 08, 1951           MRN: [id number]             PCP: Cathlean Cower, MD Referring: Biagio Borg, MD Visit Date: 06/14/2016 Occupation: care giver    Subjective:  LBP and knee pain.   History of Present Illness: Gloria [name] is a 65 y.o. female seen in consultation for evaluation of positive ANA. According to patient she has had arthritis in her knee joints for several years. Her knees hurts only with the weather change. She recalls about 10 years ago she fell and landed up on her coccyx and had coccyx fracture. She is off-and-on pain in her coccyx region. She also had an injury to her right football she was in college required surgery which also gives her some discomfort. She denies any joint swelling. She states she has been treated by her PCP with anti-inflammatories. She recently had an episode of shortness of breath for which she was referred to cardiologist according to her after cardiology workup no follow-up is required.  Activities of Daily Living:  Patient reports morning stiffness for 10 minutes.   Patient Reports nocturnal pain.  Difficulty dressing/grooming: Denies Difficulty climbing stairs: Denies Difficulty getting out of chair: Denies Difficulty using hands for taps, buttons, cutlery, and/or writing: Denies   Review of Systems  Constitutional: Positive for fatigue and weight gain. Negative for night sweats, weight loss and weakness.  HENT: Negative for mouth sores, trouble swallowing, trouble swallowing, mouth dryness and nose dryness.   Eyes: Positive for dryness. Negative for pain, redness and visual disturbance.  Respiratory: Positive for shortness of breath. Negative for cough and difficulty breathing.   Cardiovascular: Positive for hypertension and swelling in legs/feet. Negative for chest pain, palpitations and irregular heartbeat.  Gastrointestinal: Negative for blood in stool, constipation and  diarrhea.  Endocrine: Negative for increased urination.  Genitourinary: Negative for vaginal dryness.  Musculoskeletal: Positive for arthralgias and joint pain. Negative for joint swelling, myalgias, muscle weakness, morning stiffness, muscle tenderness and myalgias.       Muscle spasm, pain/tingling of hands and feet  Skin: Negative for color change, rash, hair loss, skin tightness, ulcers and sensitivity to sunlight.  Allergic/Immunologic: Negative for susceptible to infections.  Neurological: Positive for headaches. Negative for dizziness, memory loss and night sweats.  Hematological: Negative for swollen glands.  Psychiatric/Behavioral: Positive for sleep disturbance. Negative for depressed mood. The patient is nervous/anxious.     PMFS History:  Patient Active Problem List   Diagnosis Date Noted  . Polyarthralgia 03/27/2016  . Degenerative arthritis of knee, bilateral 03/27/2016  . UTI (urinary tract infection) 02/28/2016  . Low back pain 02/28/2016  . Coccyxdynia 02/28/2016  . Bilateral knee pain 02/28/2016  . Bilateral ankle pain 02/28/2016  . Encounter for preventative adult health care exam with abnormal findings 10/03/2015  . Peripheral edema 10/03/2015  . Morbid obesity ([place]) 01/01/2015  . 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 Asthma, chronic 10/17/2014  . Dyspnea 03/29/2014  . Upper airway cough syndrome 03/29/2014  . Vocal cord dysfunction 03/21/2014  . Diabetes type 2, uncontrolled ([place])   . Allergic rhinitis, cause unspecified 03/30/2013  . Chronic insomnia 09/21/2012  . Menopause 02/28/2011  . Hot flashes, menopausal 02/28/2011  . Hyperlipidemia 06/18/2010  . ARTHRITIS 06/18/2010  . ANXIETY 06/14/2010  . DEPRESSION 06/14/2010  . Migraine  06/14/2010  . Essential hypertension 06/14/2010    Past Medical History:  Diagnosis Date  . Allergic rhinitis, cause unspecified 03/30/2013  . Anxiety state, unspecified   . Arthritis   . Asthma   .  Borderline diabetes mellitus    a1c 7.0 (07/26/13)   . Chronic bronchitis   . Depressive disorder, not elsewhere classified   . Diabetes mellitus without complication ([place])   . Difficulty waking    hard to wake up past sedation!  . Migraine, unspecified, without mention of intractable migraine without mention of status migrainosus   . Other and unspecified hyperlipidemia   . Panic attacks   . Postmenopausal symptoms   . Unspecified essential hypertension   . Unspecified urinary incontinence   . UTI (lower urinary tract infection)    on 02/05/16/on meds    Family History  Problem Relation Age of Onset  . Arthritis      family history  . Hypertension      grandparent  . Colon cancer Maternal Grandfather   . Asthma Son   . Rheum arthritis Mother   . Hypertension Mother   . Diabetes Maternal Uncle   . Diabetes Paternal Uncle   . Diabetes Maternal Grandmother    Past Surgical History:  Procedure Laterality Date  . ABDOMINAL HYSTERECTOMY  2002  . DIAGNOSTIC LAPAROSCOPY    . dislocated shoulder     right  . tumor right ankle     Social History   Social History Narrative   -Retired - does Company secretary   -Divorced in 2007   -One son - lives locally. 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 and entertain family. Bowling. Interior decorating           Objective: Vital Signs: BP (!) 167/83 (BP Location: Left Arm, Patient Position: Sitting, Cuff Size: Large)   Pulse 91   Resp 13   Ht '5\' 5"'  (1.651 m)   Wt 218 lb (98.9 kg)   BMI 36.28 kg/m    Physical Exam  Constitutional: She is oriented to person, place, and time. She appears well-developed and well-nourished.  HENT:  Head: Normocephalic and atraumatic.  Eyes: Conjunctivae and EOM are normal.  Neck: Normal range of motion.  Cardiovascular: Normal rate, regular rhythm, normal heart sounds and intact distal pulses.   Pulmonary/Chest: Effort normal and breath sounds normal.    Abdominal: Soft. Bowel sounds are normal.  Lymphadenopathy:    She has no cervical adenopathy.  Neurological: She is alert and oriented to person, place, and time.  Skin: Skin is warm and dry. Capillary refill takes less than 2 seconds.  Psychiatric: She has a normal mood and affect. Her behavior is normal.  Nursing note and vitals reviewed.    Musculoskeletal Exam: C-spine, thoracic, lumbar spine good range of motion. Some tenderness over coccyx region. Shoulder joints elbow joints wrist joint MCPs PIPs DIPs with good range of motion with no synovitis. Hip joints knee joints ankles MTPs PIPs with good range of motion with no synovitis. She is some crepitus in bilateral knee joints. She had bilateral pes planus.  CDAI Exam: No CDAI exam completed.    Investigation: Findings:  April 2017 LFTs normal, 03/28/2016 CBC WBC 12.8, hemoglobin 11.4, platelets 287, iron saturation low at 12.2, ESR 82, C-reactive protein 2.1, TSH normal, vitamin D 24.35, ANA 1:320 homogeneous, double-stranded DNA negative, ace 17, CCP less than 16, 06/14/2016 UA negative, 03/29/2014 chest x-ray  normal. Echo 03/21/16 that revealed LVEF 50% with grade 2 diastolic dysfunction. There was also mild mitral regurgitation. It was recommended that she have a limited echo with Definity contrast to better evaluate for regional wall motion.    Imaging: No results found.  Speciality Comments: No specialty comments available.    Procedures:  No procedures performed Allergies: Compazine; Haloperidol decanoate; Penicillins; Metformin and related; Codeine; and Lisinopril   Assessment / Plan:     Visit Diagnoses: Polyarthralgia -patient complains of pain mostly in her knee joints which is going on for last few years. She also has some generalized arthralgias but no joint swelling. She has positive ANA and elevated sedimentation rate no clinical features of autoimmune disease on examination. She has no features of polymyalgia  rheumatica. I did not see any synovitis on examination today. To complete the workup I'll obtain following x-rays and the lab work. Plan: XR Hand 2 View Right, XR Hand 2 View Left, XR KNEE 3 VIEW RIGHT, XR KNEE 3 VIEW LEFT, CBC with Differential/Platelet, COMPLETE METABOLIC PANEL WITH GFR, Urinalysis, Routine w reflex microscopic, Sedimentation rate, CK, ANA, C3 and C4, Rheumatoid factor, Protein electrophoresis, serum, Sjogrens syndrome-B extractable nuclear antibody, Sjogrens syndrome-A extractable nuclear antibody, Anti-scleroderma antibody, Anti-Smith antibody, RNP Antibody   Osteoarthritis of hands mild: Clinical findings and radiographic findings were consistent with mild osteoarthritis of bilateral hands.   Primary osteoarthritis of both knees: X-ray of bilateral knee joints today revealed moderate osteoarthritis of medial compartment more prominent in the left than the right and chondromalacia patella. There was no chondrocalcinosis.  She has following medical problems for which she's seeing other physicians:  Uncontrolled type 2 diabetes mellitus with diabetic neuropathy, without long-term current use of insulin ([place])  Essential hypertension  Mixed hyperlipidemia  Upper airway cough syndrome  Morbid obesity ([facility])  Chronic insomnia  Migraine without status migrainosus, not intractable, unspecified migraine type  Family history of rheumatoid arthritis - In mother, sister and other relatives on maternal side    Orders: Orders Placed This Encounter  Procedures  . XR Hand 2 View Right  . XR Hand 2 View Left  . XR KNEE 3 VIEW RIGHT  . XR KNEE 3 VIEW LEFT  . CBC with Differential/Platelet  . COMPLETE METABOLIC PANEL WITH GFR  . Urinalysis, Routine w reflex microscopic  . Sedimentation rate  . CK  . ANA  . C3 and C4  . Rheumatoid factor  . Protein electrophoresis, serum  . Sjogrens syndrome-B extractable nuclear antibody  . Sjogrens syndrome-A extractable nuclear antibody   . Anti-scleroderma antibody  . Anti-Smith antibody  . RNP Antibody   No orders of the defined types were placed in this encounter.   Face-to-face time spent with patient was 45 minutes. 50% of time was spent in counseling and coordination of care.  Follow-Up Instructions: Return for Polyarthralgia.   Bo Merino, MD

## 2016-06-14 ENCOUNTER — Encounter: Payer: Self-pay | Admitting: Rheumatology

## 2016-06-14 ENCOUNTER — Ambulatory Visit (INDEPENDENT_AMBULATORY_CARE_PROVIDER_SITE_OTHER): Payer: Medicare Other

## 2016-06-14 ENCOUNTER — Ambulatory Visit (INDEPENDENT_AMBULATORY_CARE_PROVIDER_SITE_OTHER): Payer: Medicare Other | Admitting: Rheumatology

## 2016-06-14 ENCOUNTER — Ambulatory Visit: Payer: Self-pay | Admitting: Rheumatology

## 2016-06-14 VITALS — BP 167/83 | HR 91 | Resp 13 | Ht 65.0 in | Wt 218.0 lb

## 2016-06-14 DIAGNOSIS — R058 Other specified cough: Secondary | ICD-10-CM

## 2016-06-14 DIAGNOSIS — E1165 Type 2 diabetes mellitus with hyperglycemia: Secondary | ICD-10-CM

## 2016-06-14 DIAGNOSIS — E782 Mixed hyperlipidemia: Secondary | ICD-10-CM

## 2016-06-14 DIAGNOSIS — F5104 Psychophysiologic insomnia: Secondary | ICD-10-CM

## 2016-06-14 DIAGNOSIS — G43909 Migraine, unspecified, not intractable, without status migrainosus: Secondary | ICD-10-CM | POA: Diagnosis not present

## 2016-06-14 DIAGNOSIS — I10 Essential (primary) hypertension: Secondary | ICD-10-CM

## 2016-06-14 DIAGNOSIS — M170 Bilateral primary osteoarthritis of knee: Secondary | ICD-10-CM

## 2016-06-14 DIAGNOSIS — M2550 Pain in unspecified joint: Secondary | ICD-10-CM

## 2016-06-14 DIAGNOSIS — E1140 Type 2 diabetes mellitus with diabetic neuropathy, unspecified: Secondary | ICD-10-CM | POA: Diagnosis not present

## 2016-06-14 DIAGNOSIS — R05 Cough: Secondary | ICD-10-CM | POA: Diagnosis not present

## 2016-06-14 DIAGNOSIS — IMO0002 Reserved for concepts with insufficient information to code with codable children: Secondary | ICD-10-CM

## 2016-06-14 DIAGNOSIS — Z8261 Family history of arthritis: Secondary | ICD-10-CM | POA: Diagnosis not present

## 2016-06-14 LAB — CBC WITH DIFFERENTIAL/PLATELET
Basophils Absolute: 0 cells/uL (ref 0–200)
Basophils Relative: 0 %
Eosinophils Absolute: 440 cells/uL (ref 15–500)
Eosinophils Relative: 4 %
HCT: 36.9 % (ref 35.0–45.0)
Hemoglobin: 11.8 g/dL (ref 11.7–15.5)
Lymphocytes Relative: 22 %
Lymphs Abs: 2420 cells/uL (ref 850–3900)
MCH: 27.8 pg (ref 27.0–33.0)
MCHC: 32 g/dL (ref 32.0–36.0)
MCV: 87 fL (ref 80.0–100.0)
MPV: 9.5 fL (ref 7.5–12.5)
Monocytes Absolute: 550 cells/uL (ref 200–950)
Monocytes Relative: 5 %
Neutro Abs: 7590 cells/uL (ref 1500–7800)
Neutrophils Relative %: 69 %
Platelets: 335 10*3/uL (ref 140–400)
RBC: 4.24 MIL/uL (ref 3.80–5.10)
RDW: 15.2 % — ABNORMAL HIGH (ref 11.0–15.0)
WBC: 11 10*3/uL — ABNORMAL HIGH (ref 3.8–10.8)

## 2016-06-14 LAB — URINALYSIS, ROUTINE W REFLEX MICROSCOPIC
Bilirubin Urine: NEGATIVE
Glucose, UA: NEGATIVE
Hgb urine dipstick: NEGATIVE
Ketones, ur: NEGATIVE
Nitrite: NEGATIVE
Protein, ur: NEGATIVE
Specific Gravity, Urine: 1.01 (ref 1.001–1.035)
pH: 6 (ref 5.0–8.0)

## 2016-06-15 LAB — COMPLETE METABOLIC PANEL WITH GFR
ALT: 12 U/L (ref 6–29)
AST: 12 U/L (ref 10–35)
Albumin: 4 g/dL (ref 3.6–5.1)
Alkaline Phosphatase: 111 U/L (ref 33–130)
BUN: 9 mg/dL (ref 7–25)
CO2: 24 mmol/L (ref 20–31)
Calcium: 9 mg/dL (ref 8.6–10.4)
Chloride: 104 mmol/L (ref 98–110)
Creat: 0.69 mg/dL (ref 0.50–0.99)
GFR, Est African American: >89 mL/min (ref >=60)
GFR, Est Non African American: >89 mL/min (ref >=60)
Glucose, Bld: 92 mg/dL (ref 65–99)
Potassium: 3.8 mmol/L (ref 3.5–5.3)
Sodium: 139 mmol/L (ref 135–146)
Total Bilirubin: 0.2 mg/dL (ref 0.2–1.2)
Total Protein: 7.5 g/dL (ref 6.1–8.1)

## 2016-06-15 LAB — URINALYSIS, MICROSCOPIC ONLY
Bacteria, UA: NONE SEEN [HPF]
Casts: NONE SEEN [LPF]
Crystals: NONE SEEN [HPF]
RBC / HPF: NONE SEEN RBC/HPF (ref <=2)
Squamous Epithelial / LPF: NONE SEEN [HPF] (ref <=5)
WBC, UA: NONE SEEN WBC/HPF (ref <=5)
Yeast: NONE SEEN [HPF]

## 2016-06-15 LAB — CK: Total CK: 124 U/L (ref 7–177)

## 2016-06-15 LAB — SEDIMENTATION RATE: Sed Rate: 55 mm/hr — ABNORMAL HIGH (ref 0–30)

## 2016-06-17 LAB — SJOGRENS SYNDROME-A EXTRACTABLE NUCLEAR ANTIBODY: SSA (Ro) (ENA) Antibody, IgG: <1

## 2016-06-17 LAB — ANTI-SMITH ANTIBODY: ENA SM Ab Ser-aCnc: <1

## 2016-06-17 LAB — C3 AND C4
C3 Complement: 183 mg/dL — ABNORMAL HIGH (ref 90–180)
C4 Complement: 36 mg/dL (ref 16–47)

## 2016-06-17 LAB — ANTI-SCLERODERMA ANTIBODY: Scleroderma (Scl-70) (ENA) Antibody, IgG: <1

## 2016-06-17 LAB — RHEUMATOID FACTOR: Rhuematoid fact SerPl-aCnc: <14 IU/mL (ref <14)

## 2016-06-17 LAB — RNP ANTIBODY: Ribonucleic Protein(ENA) Antibody, IgG: <1

## 2016-06-17 LAB — SJOGRENS SYNDROME-B EXTRACTABLE NUCLEAR ANTIBODY: SSB (La) (ENA) Antibody, IgG: <1

## 2016-06-18 LAB — PROTEIN ELECTROPHORESIS, SERUM
Albumin ELP: 3.7 g/dL — ABNORMAL LOW (ref 3.8–4.8)
Alpha-1-Globulin: 0.4 g/dL — ABNORMAL HIGH (ref 0.2–0.3)
Alpha-2-Globulin: 0.8 g/dL (ref 0.5–0.9)
Beta 2: 0.7 g/dL — ABNORMAL HIGH (ref 0.2–0.5)
Beta Globulin: 0.5 g/dL (ref 0.4–0.6)
Gamma Globulin: 1.4 g/dL (ref 0.8–1.7)
Total Protein, Serum Electrophoresis: 7.5 g/dL (ref 6.1–8.1)

## 2016-06-18 LAB — ANTI-NUCLEAR AB-TITER (ANA TITER): ANA Titer 1: 1:320 {titer} — ABNORMAL HIGH

## 2016-06-18 LAB — ANA: Anti Nuclear Antibody(ANA): POSITIVE — AB

## 2016-06-19 NOTE — Progress Notes (Signed)
ANA positive, ENA negative. No clinical features of auto dis. Will discuss at fu visit.

## 2016-06-20 ENCOUNTER — Ambulatory Visit (HOSPITAL_COMMUNITY)
Admission: EM | Admit: 2016-06-20 | Discharge: 2016-06-20 | Disposition: A | Discharge status: Home / Self Care | Payer: Medicaid Other | Attending: Emergency Medicine | Admitting: Emergency Medicine

## 2016-06-20 ENCOUNTER — Encounter (HOSPITAL_COMMUNITY): Payer: Self-pay | Admitting: Family Medicine

## 2016-06-20 DIAGNOSIS — M791 Myalgia, unspecified site: Secondary | ICD-10-CM

## 2016-06-20 DIAGNOSIS — M609 Myositis, unspecified: Secondary | ICD-10-CM | POA: Diagnosis not present

## 2016-06-20 MED ORDER — TRAMADOL-ACETAMINOPHEN 37.5-325 MG PO TABS: ORAL_TABLET | ORAL | 0 refills | Status: DC

## 2016-06-20 MED ORDER — METHYLPREDNISOLONE ACETATE 80 MG/ML IJ SUSP
INTRAMUSCULAR | Status: AC
  Filled 2016-06-20: qty 1

## 2016-06-20 MED ORDER — KETOROLAC TROMETHAMINE 60 MG/2ML IM SOLN
INTRAMUSCULAR | Status: AC
  Filled 2016-06-20: qty 2

## 2016-06-20 MED ORDER — KETOROLAC TROMETHAMINE 60 MG/2ML IM SOLN
45.0000 mg | Freq: Once | INTRAMUSCULAR | Status: AC
  Administered 2016-06-20: 60 mg via INTRAMUSCULAR

## 2016-06-20 MED ORDER — METHYLPREDNISOLONE ACETATE 80 MG/ML IJ SUSP
80.0000 mg | Freq: Once | INTRAMUSCULAR | Status: AC
  Administered 2016-06-20: 80 mg via INTRAMUSCULAR

## 2016-06-20 MED ORDER — NAPROXEN 250 MG PO TABS: 250.0000 mg | ORAL_TABLET | Freq: Two times a day (BID) | ORAL | 0 refills | Status: DC

## 2016-06-20 NOTE — ED Provider Notes (Signed)
CSN: [id number]     Arrival date & time 06/20/16  1617 History   First MD Initiated Contact with Patient 06/20/16 1625     Chief Complaint  Patient presents with  . Back Pain    arm pain   (Consider location/radiation/quality/duration/timing/severity/associated sxs/prior Treatment) 65 year old female states that she developed muscle type pain yesterday around 5 PM. The pain is distributed along the tricep. Of the left upper arm from the elbow close to the axilla. Pain is also present along the far left lateral anterior chest and the infra scapularis musculature. Pain is exacerbated with movement of the arm. She is unable to recall any type of repetitive movement, injury, position or other possible etiology for pain in these areas. The pain is constant and exacerbated as above.      Past Medical History:  Diagnosis Date  . Allergic rhinitis, cause unspecified 03/30/2013  . Anxiety state, unspecified   . Arthritis   . Asthma   . Borderline diabetes mellitus    a1c 7.0 (07/26/13)   . Chronic bronchitis   . Depressive disorder, not elsewhere classified   . Diabetes mellitus without complication ([place])   . Difficulty waking    hard to wake up past sedation!  . Migraine, unspecified, without mention of intractable migraine without mention of status migrainosus   . Other and unspecified hyperlipidemia   . Panic attacks   . Postmenopausal symptoms   . Unspecified essential hypertension   . Unspecified urinary incontinence   . UTI (lower urinary tract infection)    on 02/05/16/on meds   Past Surgical History:  Procedure Laterality Date  . ABDOMINAL HYSTERECTOMY  2002  . DIAGNOSTIC LAPAROSCOPY    . dislocated shoulder     right  . tumor right ankle     Family History  Problem Relation Age of Onset  . Arthritis      family history  . Hypertension      grandparent  . Colon cancer Maternal Grandfather   . Asthma Son   . Rheum arthritis Mother   . Hypertension Mother   .  Diabetes Maternal Uncle   . Diabetes Paternal Uncle   . Diabetes Maternal Grandmother    Social History  Substance Use Topics  . Smoking status: Never Smoker  . Smokeless tobacco: Never Used  . Alcohol use No   OB History    Gravida Para Term Preterm AB Living   1 1 1     1    SAB TAB Ectopic Multiple Live Births                 Review of Systems  Constitutional: Positive for activity change. Negative for appetite change, fatigue and fever.  HENT: Negative.   Respiratory: Negative.   Gastrointestinal: Negative.   Genitourinary: Negative.   Musculoskeletal: Positive for myalgias. Negative for arthralgias.  Skin: Negative.   Neurological: Negative.     Allergies  Compazine; Haloperidol decanoate; Penicillins; Metformin and related; Codeine; and Lisinopril  Home Medications   Prior to Admission medications   Medication Sig Start Date End Date Taking? Authorizing Provider  albuterol (PROAIR HFA) 108 (90 BASE) MCG/ACT inhaler Inhale 2 puffs into the lungs every 4 (four) hours as needed for wheezing (((PLAN B))).    Historical Provider, MD  albuterol (PROVENTIL) (2.5 MG/3ML) 0.083% nebulizer solution Inhale 3 mLs (2.5 mg total) into the lungs every 4 (four) hours as needed for wheezing or shortness of breath (((PLAN C))). 10/17/14   Dorothyann Peng, NP  ALPRAZolam (XANAX) 1 MG tablet Take 1 mg by mouth 2 (two) times daily as needed for anxiety.     Historical Provider, MD  budesonide-formoterol (SYMBICORT) 160-4.5 MCG/ACT inhaler Inhale 2 puffs into the lungs 2 (two) times daily. 04/25/16   Tanda Rockers, MD  celecoxib (CELEBREX) 200 MG capsule Take 1 capsule (200 mg total) by mouth 2 (two) times daily as needed. 02/28/16   Biagio Borg, MD  Cholecalciferol (VITAMIN D3) 5000 UNITS CAPS Take by mouth every morning.     Historical Provider, MD  dextromethorphan-guaiFENesin (MUCINEX DM) 30-600 MG per 12 hr tablet Take 1 tablet by mouth 2 (two) times daily as needed (w/ flutter valve for  cough and congestion).     Historical Provider, MD  fluticasone (FLONASE) 50 MCG/ACT nasal spray Place 2 sprays into both nostrils daily as needed for allergies or rhinitis. 11/03/14   Tanda Rockers, MD  furosemide (LASIX) 20 MG tablet Take 1 tablet (20 mg total) by mouth daily. Patient not taking: Reported on 06/14/2016 10/03/15   Biagio Borg, MD  glucose blood (ACCU-CHEK AVIVA) test strip Use 1x a day Patient not taking: Reported on 06/14/2016 04/01/16   Philemon Kingdom, MD  HYDROcodone-acetaminophen (NORCO/VICODIN) 5-325 MG tablet Take 1-2 tablets by mouth every 4 (four) hours as needed (if still coughing). 05/21/16   Biagio Borg, MD  irbesartan (AVAPRO) 150 MG tablet Take 1 tablet (150 mg total) by mouth daily. 05/16/16   Skeet Latch, MD  Multiple Vitamin (MULTIVITAMIN) tablet Take 1 tablet by mouth daily. Woman's MVI-TAke one daily    Historical Provider, MD  naproxen (NAPROSYN) 250 MG tablet Take 1 tablet (250 mg total) by mouth 2 (two) times daily with a meal. 06/20/16   Janne Napoleon, NP  ondansetron (ZOFRAN) 4 MG tablet Take 4 mg by mouth every 8 (eight) hours as needed for nausea or vomiting.    Historical Provider, MD  QUEtiapine (SEROQUEL) 50 MG tablet TK 1/2 TO 1 T PO QHS 06/03/16   Historical Provider, MD  Respiratory Therapy Supplies (FLUTTER) DEVI Use as directed 03/30/14   Tanda Rockers, MD  traMADol-acetaminophen (ULTRACET) 37.5-325 MG tablet One tab po q 4 to 6 hr prn pain 06/20/16   Janne Napoleon, NP  Vitamin D, Ergocalciferol, (DRISDOL) 50000 units CAPS capsule Take 1 capsule (50,000 Units total) by mouth every 7 (seven) days. 03/27/16   Lyndal Pulley, DO   Meds Ordered and Administered this Visit   Medications  ketorolac (TORADOL) injection 45 mg (not administered)  methylPREDNISolone acetate (DEPO-MEDROL) injection 80 mg (not administered)    BP 187/97   Pulse 96   Temp 98.3 F (36.8 C) (Oral)   Resp 18   SpO2 98%  No data found.   Physical Exam  Constitutional:  She is oriented to person, place, and time. She appears well-developed and well-nourished. No distress.  HENT:  Head: Normocephalic and atraumatic.  Eyes: EOM are normal.  Neck: Normal range of motion. Neck supple.  Cardiovascular: Normal rate, regular rhythm, normal heart sounds and intact distal pulses.   Pulmonary/Chest: Effort normal and breath sounds normal. No respiratory distress. She has no wheezes. She has no rales. She exhibits tenderness.  Musculoskeletal: She exhibits no edema or deformity.  Soft tissue tenderness to the posterior left arm. This primarily involves the tricep muscle of the upper arm, left infra spinatus and left lateral anterior chest wall. Patient is able to abduct, adduct and rotate shoulder without shoulder joint pain  but in part exacerbates the other muscles described above. Left upper extremity, distal neurovascular motor sensory grossly intact. Radial pulse 2+. Normal color and warmth. No bony tenderness. No tenderness along the joint line.  Neurological: She is alert and oriented to person, place, and time. No cranial nerve deficit.  Skin: Skin is warm and dry.  Psychiatric: She has a normal mood and affect.  Nursing note and vitals reviewed.   Urgent Care Course   Clinical Course     Procedures (including critical care time)  Labs Review Labs Reviewed - No data to display  Imaging Review No results found.   Visual Acuity Review  Right Eye Distance:   Left Eye Distance:   Bilateral Distance:    Right Eye Near:   Left Eye Near:    Bilateral Near:         MDM   1. Myalgia   2. Myofasciitis    Apply the Pensaid cream that you have at home to the areas of pain up to 4 times a day. Recommend applying heat to the areas of pain as well 3-4 times a day. Take the medication as prescribed. Do not take any other anti-inflammatory medicines while taking these new prescription medications. Follow-up with your primary care doctor as needed. Meds  ordered this encounter  Medications  . ketorolac (TORADOL) injection 45 mg  . methylPREDNISolone acetate (DEPO-MEDROL) injection 80 mg  . naproxen (NAPROSYN) 250 MG tablet    Sig: Take 1 tablet (250 mg total) by mouth 2 (two) times daily with a meal.    Dispense:  20 tablet    Refill:  0    Order Specific Question:   Supervising Provider    Answer:   Melony Overly [id number]  . 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 The CSRS was reviewed and the patient has been receiving Norco 5 mg #40 every 1-2 months for the sheer. It is unclear as to why she is receiving this medication. Her last refill was 05/21/2016.    Janne Napoleon, NP 06/20/16 1654    Janne Napoleon, NP 06/20/16 1710    Janne Napoleon, NP 06/20/16 2052

## 2016-06-20 NOTE — Discharge Instructions (Signed)
Apply the Pensaid cream that you have at home to the areas of pain up to 4 times a day. Recommend applying heat to the areas of pain as well 3-4 times a day. Take the medication as prescribed. Do not take any other anti-inflammatory medicines while taking these new prescription medications. Follow-up with your primary care doctor as needed.

## 2016-06-20 NOTE — ED Triage Notes (Signed)
Pt here for muscle spasms in left arm and left upper back that started yesterday. sts that it has been constant

## 2016-06-23 ENCOUNTER — Encounter (HOSPITAL_COMMUNITY): Payer: Self-pay

## 2016-06-23 ENCOUNTER — Ambulatory Visit (HOSPITAL_COMMUNITY)
Admission: EM | Admit: 2016-06-23 | Discharge: 2016-06-23 | Disposition: A | Discharge status: Home / Self Care | Payer: Medicare Other | Attending: Family Medicine | Admitting: Family Medicine

## 2016-06-23 ENCOUNTER — Other Ambulatory Visit: Payer: Self-pay

## 2016-06-23 ENCOUNTER — Other Ambulatory Visit: Payer: Self-pay | Admitting: Family Medicine

## 2016-06-23 DIAGNOSIS — M79602 Pain in left arm: Secondary | ICD-10-CM

## 2016-06-23 MED ORDER — KETOROLAC TROMETHAMINE 60 MG/2ML IM SOLN
60.0000 mg | Freq: Once | INTRAMUSCULAR | Status: AC
  Administered 2016-06-23: 60 mg via INTRAMUSCULAR

## 2016-06-23 MED ORDER — NABUMETONE 500 MG PO TABS: 500.0000 mg | ORAL_TABLET | Freq: Every day | ORAL | 0 refills | Status: DC

## 2016-06-23 MED ORDER — GABAPENTIN 300 MG PO CAPS: 300.0000 mg | ORAL_CAPSULE | Freq: Two times a day (BID) | ORAL | 0 refills | Status: DC

## 2016-06-23 MED ORDER — KETOROLAC TROMETHAMINE 60 MG/2ML IM SOLN
INTRAMUSCULAR | Status: AC
  Filled 2016-06-23: qty 2

## 2016-06-23 NOTE — Discharge Instructions (Signed)
Your exam is normal. He did not have any   sign of a seriousillness here.  There is no abnormality seen on your cardiogram that would explain the pain.  Therefore I am prescribing medicine should help with pain. You take it twice a day. I would like you follow-up with your primary care doctor next week.

## 2016-06-23 NOTE — ED Provider Notes (Signed)
[place]    CSN: [id number] Arrival date & time: 06/23/16  1320     History   Chief Complaint Chief Complaint  Patient presents with  . Arm Pain    HPI Gloria [name] is a 65 y.o. female.   Is a 65 year old woman with spontaneous left arm pain on Wednesday night last week. Patient has been unremittent despite evaluation here on Thursday and given Toradol and Depo-Medrol. She says the pain is slightly worse when she moves her arm but is constant. The pain is most intense just above the left elbow and the triceps insertion area but she also has pain over the left shoulder blade.  She denies neck pain, chest pain, shortness of breath, trauma.      Past Medical History:  Diagnosis Date  . Allergic rhinitis, cause unspecified 03/30/2013  . Anxiety state, unspecified   . Arthritis   . Asthma   . Borderline diabetes mellitus    a1c 7.0 (07/26/13)   . Chronic bronchitis   . Depressive disorder, not elsewhere classified   . Diabetes mellitus without complication ([place])   . Difficulty waking    hard to wake up past sedation!  . Migraine, unspecified, without mention of intractable migraine without mention of status migrainosus   . Other and unspecified hyperlipidemia   . Panic attacks   . Postmenopausal symptoms   . Unspecified essential hypertension   . Unspecified urinary incontinence   . UTI (lower urinary tract infection)    on 02/05/16/on meds    Patient Active Problem List   Diagnosis Date Noted  . Polyarthralgia 03/27/2016  . Degenerative arthritis of knee, bilateral 03/27/2016  . UTI (urinary tract infection) 02/28/2016  . Low back pain 02/28/2016  . Coccyxdynia 02/28/2016  . Bilateral knee pain 02/28/2016  . Bilateral ankle pain 02/28/2016  . Encounter for preventative adult health care exam with abnormal findings 10/03/2015  . Peripheral edema 10/03/2015  . Morbid obesity ([place]) 01/01/2015  . 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  Asthma, chronic 10/17/2014  . Dyspnea 03/29/2014  . Upper airway cough syndrome 03/29/2014  . Vocal cord dysfunction 03/21/2014  . Diabetes type 2, uncontrolled ([place])   . Allergic rhinitis, cause unspecified 03/30/2013  . Chronic insomnia 09/21/2012  . Menopause 02/28/2011  . Hot flashes, menopausal 02/28/2011  . Hyperlipidemia 06/18/2010  . ARTHRITIS 06/18/2010  . ANXIETY 06/14/2010  . DEPRESSION 06/14/2010  . Migraine 06/14/2010  . Essential hypertension 06/14/2010    Past Surgical History:  Procedure Laterality Date  . ABDOMINAL HYSTERECTOMY  2002  . DIAGNOSTIC LAPAROSCOPY    . dislocated shoulder     right  . tumor right ankle      OB History    Gravida Para Term Preterm AB Living   1 1 1     1    SAB TAB Ectopic Multiple Live Births                   Home Medications    Prior to Admission medications   Medication Sig Start Date End Date Taking? Authorizing Provider  albuterol (PROAIR HFA) 108 (90 BASE) MCG/ACT inhaler Inhale 2 puffs into the lungs every 4 (four) hours as needed for wheezing (((PLAN B))).    Historical Provider, MD  albuterol (PROVENTIL) (2.5 MG/3ML) 0.083% nebulizer solution Inhale 3 mLs (2.5 mg total) into the lungs every 4 (four) hours as needed for wheezing or shortness of breath (((PLAN C))). 10/17/14   Dorothyann Peng, NP  ALPRAZolam (XANAX) 1 MG tablet Take 1 mg by mouth 2 (two) times daily as needed for anxiety.     Historical Provider, MD  budesonide-formoterol (SYMBICORT) 160-4.5 MCG/ACT inhaler Inhale 2 puffs into the lungs 2 (two) times daily. 04/25/16   Tanda Rockers, MD  celecoxib (CELEBREX) 200 MG capsule Take 1 capsule (200 mg total) by mouth 2 (two) times daily as needed. 02/28/16   Biagio Borg, MD  Cholecalciferol (VITAMIN D3) 5000 UNITS CAPS Take by mouth every morning.     Historical Provider, MD  fluticasone (FLONASE) 50 MCG/ACT nasal spray Place 2 sprays into both nostrils daily as needed for allergies or rhinitis. 11/03/14   Tanda Rockers, MD  gabapentin (NEURONTIN) 300 MG capsule Take 1 capsule (300 mg total) by mouth 2 (two) times daily. 06/23/16   Robyn Haber, MD  irbesartan (AVAPRO) 150 MG tablet Take 1 tablet (150 mg total) by mouth daily. 05/16/16   Skeet Latch, MD  Multiple Vitamin (MULTIVITAMIN) tablet Take 1 tablet by mouth daily. Woman's MVI-TAke one daily    Historical Provider, MD  QUEtiapine (SEROQUEL) 50 MG tablet TK 1/2 TO 1 T PO QHS 06/03/16   Historical Provider, MD  Respiratory Therapy Supplies (FLUTTER) DEVI Use as directed 03/30/14   Tanda Rockers, MD  Vitamin D, Ergocalciferol, (DRISDOL) 50000 units CAPS capsule Take 1 capsule (50,000 Units total) by mouth every 7 (seven) days. 03/27/16   Lyndal Pulley, DO    Family History Family History  Problem Relation Age of Onset  . Arthritis      family history  . Hypertension      grandparent  . Colon cancer Maternal Grandfather   . Asthma Son   . Rheum arthritis Mother   . Hypertension Mother   . Diabetes Maternal Uncle   . Diabetes Paternal Uncle   . Diabetes Maternal Grandmother     Social History Social History  Substance Use Topics  . Smoking status: Never Smoker  . Smokeless tobacco: Never Used  . Alcohol use No     Allergies   Compazine; Haloperidol decanoate; Penicillins; Metformin and related; Codeine; and Lisinopril   Review of Systems Review of Systems  Constitutional: Negative.   HENT: Negative.   Respiratory: Positive for chest tightness.   Cardiovascular: Positive for chest pain.  Musculoskeletal: Positive for back pain. Negative for joint swelling, neck pain and neck stiffness.  Neurological: Negative.   Psychiatric/Behavioral: Negative.      Physical Exam Triage Vital Signs ED Triage Vitals  Enc Vitals Group     BP 06/23/16 1341 129/95     Pulse Rate 06/23/16 1341 113     Resp 06/23/16 1341 16     Temp 06/23/16 1341 97.5 F (36.4 C)     Temp Source 06/23/16 1341 Oral     SpO2 06/23/16 1341 96 %      Weight --      Height --      Head Circumference --      Peak Flow --      Pain Score 06/23/16 1343 10     Pain Loc --      Pain Edu? --      Excl. in [place]? --    No data found.   Updated Vital Signs BP 129/95 (BP Location: Right Arm)   Pulse 113   Temp 97.5 F (36.4 C) (Oral)   Resp 16   SpO2 96%    Physical Exam  Constitutional: She  is oriented to person, place, and time. She appears well-developed and well-nourished.  HENT:  Head: Normocephalic.  Right Ear: External ear normal.  Left Ear: External ear normal.  Mouth/Throat: Oropharynx is clear and moist.  Eyes: Conjunctivae and EOM are normal.  Neck: Normal range of motion. Neck supple.  Cardiovascular: Regular rhythm and normal heart sounds.   Heart rate is approximately 1 20 bpm and regular  Pulmonary/Chest: Effort normal and breath sounds normal.  Abdominal: Soft. There is no tenderness.  Musculoskeletal: Normal range of motion.  Lymphadenopathy:    She has no cervical adenopathy.  Neurological: She is alert and oriented to person, place, and time.  Skin: Skin is warm and dry.  Nursing note and vitals reviewed.    UC Treatments / Results  Labs (all labs ordered are listed, but only abnormal results are displayed) Labs Reviewed - No data to display  EKG  EKG Interpretation None       Radiology No results found.  Procedures Procedures (including critical care time)  Medications Ordered in UC Medications - No data to display   Initial Impression / Assessment and Plan / UC Course  I have reviewed the triage vital signs and the nursing notes.  Pertinent labs & imaging results that were available during my care of the patient were reviewed by me and considered in my medical decision making (see chart for details).  Clinical Course     EKG was consistent with sinus tachycardia  Final Clinical Impressions(s) / UC Diagnoses   Final diagnoses:  Left arm pain    New Prescriptions New  Prescriptions   GABAPENTIN (NEURONTIN) 300 MG CAPSULE    Take 1 capsule (300 mg total) by mouth 2 (two) times daily.     Robyn Haber, MD 06/23/16 1434

## 2016-06-23 NOTE — ED Triage Notes (Signed)
Pt said she was here the other day and still having pain in her left arm and it isn't getting any better. Said the injections and medications we gave her did not help. From her left shoulder radiating down her left fingers.

## 2016-06-25 ENCOUNTER — Encounter: Payer: Self-pay | Admitting: Internal Medicine

## 2016-06-25 ENCOUNTER — Ambulatory Visit (INDEPENDENT_AMBULATORY_CARE_PROVIDER_SITE_OTHER): Payer: Medicare Other | Admitting: Internal Medicine

## 2016-06-25 VITALS — BP 140/80 | HR 100 | Temp 98.2°F | Resp 20 | Wt 216.0 lb

## 2016-06-25 DIAGNOSIS — M79602 Pain in left arm: Secondary | ICD-10-CM | POA: Diagnosis not present

## 2016-06-25 DIAGNOSIS — I10 Essential (primary) hypertension: Secondary | ICD-10-CM | POA: Diagnosis not present

## 2016-06-25 DIAGNOSIS — E1165 Type 2 diabetes mellitus with hyperglycemia: Secondary | ICD-10-CM | POA: Diagnosis not present

## 2016-06-25 DIAGNOSIS — E1140 Type 2 diabetes mellitus with diabetic neuropathy, unspecified: Secondary | ICD-10-CM

## 2016-06-25 DIAGNOSIS — IMO0002 Reserved for concepts with insufficient information to code with codable children: Secondary | ICD-10-CM

## 2016-06-25 MED ORDER — LIDOCAINE 5 % EX PTCH: 1.0000 | MEDICATED_PATCH | CUTANEOUS | 1 refills | Status: DC

## 2016-06-25 MED ORDER — IBUPROFEN 800 MG PO TABS: 800.0000 mg | ORAL_TABLET | Freq: Three times a day (TID) | ORAL | 0 refills | Status: DC | PRN

## 2016-06-25 MED ORDER — HYDROCODONE-ACETAMINOPHEN 5-325 MG PO TABS: 1.0000 | ORAL_TABLET | Freq: Four times a day (QID) | ORAL | 0 refills | Status: DC | PRN

## 2016-06-25 NOTE — Progress Notes (Signed)
Subjective:    Patient ID: Gloria [name], female    DOB: 1951/01/03, 65 y.o.   MRN: [id number]  HPI  Here after being seen twice at UC dec 21 and 24; symptoms began about dec 20, primarily c/o left arm discomfort diffusely but worst is at the left elbow, but also radiates and tenderness noted to the more prox arm to the shoulder, and even the left scapular area as well.  Denies HA and neck pain specifically, and denies reduced left grip strength.  Pain is worst to bend or use the arm at the elbow, but also with pain diffusely to the prox arm and with a kind of tenderness without swelling.  Does point to a subq post left elbow small mass but does not seem to be source of pain.  No recent trauma. No fever or skin changes.  States gabapentin not helping, but on further hx seems to not be taking at this time (vague on this point), due to med not really helping with feet pain at one point in the past.  Pain is really best to not move the left arm at the elbow, and asks for sling.   Past Medical History:  Diagnosis Date  . Allergic rhinitis, cause unspecified 03/30/2013  . Anxiety state, unspecified   . Arthritis   . Asthma   . Borderline diabetes mellitus    a1c 7.0 (07/26/13)   . Chronic bronchitis   . Depressive disorder, not elsewhere classified   . Diabetes mellitus without complication ([place])   . Difficulty waking    hard to wake up past sedation!  . Migraine, unspecified, without mention of intractable migraine without mention of status migrainosus   . Other and unspecified hyperlipidemia   . Panic attacks   . Postmenopausal symptoms   . Unspecified essential hypertension   . Unspecified urinary incontinence   . UTI (lower urinary tract infection)    on 02/05/16/on meds   Past Surgical History:  Procedure Laterality Date  . ABDOMINAL HYSTERECTOMY  2002  . DIAGNOSTIC LAPAROSCOPY    . dislocated shoulder     right  . tumor right ankle      reports that she has never smoked. She has  never used smokeless tobacco. She reports that she does not drink alcohol or use drugs. family history includes Asthma in her son; Colon cancer in her maternal grandfather; Diabetes in her maternal grandmother, maternal uncle, and paternal uncle; Hypertension in her mother; Rheum arthritis in her mother. Allergies  Allergen Reactions  . Compazine Other (See Comments)    Stroke like symptoms  . Haloperidol Decanoate Other (See Comments)    Extrapyramidal syndrome  . Penicillins Nausea And Vomiting  . Metformin And Related Other (See Comments)    GI upset  . Codeine Itching  . Lisinopril Cough   Current Outpatient Prescriptions on File Prior to Visit  Medication Sig Dispense Refill  . albuterol (PROAIR HFA) 108 (90 BASE) MCG/ACT inhaler Inhale 2 puffs into the lungs every 4 (four) hours as needed for wheezing (((PLAN B))).    Marland Kitchen albuterol (PROVENTIL) (2.5 MG/3ML) 0.083% nebulizer solution Inhale 3 mLs (2.5 mg total) into the lungs every 4 (four) hours as needed for wheezing or shortness of breath (((PLAN C))). 75 mL 2  . ALPRAZolam (XANAX) 1 MG tablet Take 1 mg by mouth 2 (two) times daily as needed for anxiety.     . budesonide-formoterol (SYMBICORT) 160-4.5 MCG/ACT inhaler Inhale 2 puffs into the lungs 2 (two)  times daily. 1 Inhaler 5  . celecoxib (CELEBREX) 200 MG capsule Take 1 capsule (200 mg total) by mouth 2 (two) times daily as needed. 180 capsule 3  . Cholecalciferol (VITAMIN D3) 5000 UNITS CAPS Take by mouth every morning.     . fluticasone (FLONASE) 50 MCG/ACT nasal spray Place 2 sprays into both nostrils daily as needed for allergies or rhinitis. 1 g 11  . gabapentin (NEURONTIN) 300 MG capsule Take 1 capsule (300 mg total) by mouth 2 (two) times daily. 14 capsule 0  . irbesartan (AVAPRO) 150 MG tablet Take 1 tablet (150 mg total) by mouth daily. 90 tablet 1  . Multiple Vitamin (MULTIVITAMIN) tablet Take 1 tablet by mouth daily. Woman's MVI-TAke one daily    . nabumetone (RELAFEN)  500 MG tablet Take 1 tablet (500 mg total) by mouth daily. 20 tablet 0  . QUEtiapine (SEROQUEL) 50 MG tablet TK 1/2 TO 1 T PO QHS  2  . Respiratory Therapy Supplies (FLUTTER) DEVI Use as directed 1 each 0  . Vitamin D, Ergocalciferol, (DRISDOL) 50000 units CAPS capsule Take 1 capsule (50,000 Units total) by mouth every 7 (seven) days. 12 capsule 0   No current facility-administered medications on file prior to visit.    Review of Systems  Constitutional: Negative for unusual diaphoresis or night sweats HENT: Negative for ear swelling or discharge Eyes: Negative for worsening visual haziness  Respiratory: Negative for choking and stridor.   Gastrointestinal: Negative for distension or worsening eructation Genitourinary: Negative for retention or change in urine volume.  Musculoskeletal: Negative for other MSK pain or swelling Skin: Negative for color change and worsening wound Neurological: Negative for tremors and numbness other than noted  Psychiatric/Behavioral: Negative for decreased concentration or agitation other than above   All other system neg per pt    Objective:   Physical Exam /BP 140/80   Pulse 100   Temp 98.2 F (36.8 C) (Oral)   Resp 20   Wt 216 lb (98 kg)   SpO2 96%   BMI 35.94 kg/m  VS noted, not ill appearing but anxious in pain, holding left arm across the body Constitutional: Pt appears in no other apparent distress HENT: Head: NCAT.  Right Ear: External ear normal.  Left Ear: External ear normal.  Eyes: . Pupils are equal, round, and reactive to light. Conjunctivae and EOM are normal Neck: Normal range of motion. Neck supple.  Cardiovascular: Normal rate and regular rhythm.   Pulmonary/Chest: Effort normal and breath sounds without rales or wheezing.  C-spine  Nontender, no swelling or rash Left upper arm with diffuse pain /tender ? Dysesthesia like without swelling or erythema, pain possible worse locally at the left ulnar groove?  Neurological: Pt is  alert. Not confused , motor 5/5 intact Skin: Skin is warm. No rash, no LE edema Psychiatric: Pt behavior is normal. No agitation.     Assessment & Plan:

## 2016-06-25 NOTE — Assessment & Plan Note (Signed)
Doubt mononeuritis related to DM, o/w stable overall by history and exam, recent data reviewed with pt, and pt to continue medical treatment as before,  to f/u any worsening symptoms or concerns Lab Results  Component Value Date   HGBA1C 6.6 04/01/2016

## 2016-06-25 NOTE — Progress Notes (Signed)
Letter done

## 2016-06-25 NOTE — Progress Notes (Signed)
Pre visit review using our clinic review tool, if applicable. No additional management support is needed unless otherwise documented below in the visit note. 

## 2016-06-25 NOTE — Assessment & Plan Note (Signed)
stable overall by history and exam, recent data reviewed with pt, and pt to continue medical treatment as before,  to f/u any worsening symptoms or concerns BP Readings from Last 3 Encounters:  06/25/16 140/80  06/23/16 129/95  06/20/16 187/97

## 2016-06-25 NOTE — Assessment & Plan Note (Signed)
Etiology unclear but differential includes ulnar neuritis, impingement syndrome of some type, vs other, doubt cspine radiculopathy; pt adamant gabapentin does not work for her, asks repeatedly for a shot, or patch or sling today which are not appropriate for this visit; for ibuprofen prn, with hydrocodone 5 325 prn breakthrough pain, for LUE NCS/EMG asap, work note given,  to f/u any worsening symptoms or concerns

## 2016-06-25 NOTE — Patient Instructions (Addendum)
Please take all new medication as prescribed - the ibuprofen, and hydrocodone as needed, as well as the lidoderm refills  Please continue all other medications as before, and refills have been done if requested.  Please have the pharmacy call with any other refills you may need.  Please keep your appointments with your specialists as you may have planned  You will be contacted regarding the referral for: Nerve testing for the Arm  You are given the work note today  Depending on how you do and the test results, you may need to see Neurosurgury or orthopedics

## 2016-06-26 ENCOUNTER — Ambulatory Visit: Payer: Medicaid Other | Admitting: Cardiovascular Disease

## 2016-07-04 ENCOUNTER — Encounter: Payer: Self-pay | Admitting: Internal Medicine

## 2016-07-04 ENCOUNTER — Ambulatory Visit (INDEPENDENT_AMBULATORY_CARE_PROVIDER_SITE_OTHER): Payer: Medicare Other | Admitting: Internal Medicine

## 2016-07-04 VITALS — BP 140/80 | HR 106 | Resp 20 | Wt 221.0 lb

## 2016-07-04 DIAGNOSIS — M79602 Pain in left arm: Secondary | ICD-10-CM | POA: Diagnosis not present

## 2016-07-04 DIAGNOSIS — E1140 Type 2 diabetes mellitus with diabetic neuropathy, unspecified: Secondary | ICD-10-CM

## 2016-07-04 DIAGNOSIS — I10 Essential (primary) hypertension: Secondary | ICD-10-CM | POA: Diagnosis not present

## 2016-07-04 DIAGNOSIS — IMO0002 Reserved for concepts with insufficient information to code with codable children: Secondary | ICD-10-CM

## 2016-07-04 DIAGNOSIS — E1165 Type 2 diabetes mellitus with hyperglycemia: Secondary | ICD-10-CM

## 2016-07-04 NOTE — Progress Notes (Signed)
Subjective:    Patient ID: Gloria [name], female    DOB: 1951/06/04, 66 y.o.   MRN: [id number]  HPI  Here to f/u, and fortunately LUE is near completely resolved with respect to pain, numbness and inability to use.  Very much wants to get back to work, needs form to attest to ability.  No further significant arm or neck pain,Pt denies chest pain, increased sob or doe, wheezing, orthopnea, PND, increased LE swelling, palpitations, dizziness or syncope.  Pt denies new neurological symptoms such as new headache, or facial or extremity weakness or numbness   Pt denies polydipsia, polyuria Past Medical History:  Diagnosis Date  . Allergic rhinitis, cause unspecified 03/30/2013  . Anxiety state, unspecified   . Arthritis   . Asthma   . Borderline diabetes mellitus    a1c 7.0 (07/26/13)   . Chronic bronchitis   . Depressive disorder, not elsewhere classified   . Diabetes mellitus without complication ([place])   . Difficulty waking    hard to wake up past sedation!  . Migraine, unspecified, without mention of intractable migraine without mention of status migrainosus   . Other and unspecified hyperlipidemia   . Panic attacks   . Postmenopausal symptoms   . Unspecified essential hypertension   . Unspecified urinary incontinence   . UTI (lower urinary tract infection)    on 02/05/16/on meds   Past Surgical History:  Procedure Laterality Date  . ABDOMINAL HYSTERECTOMY  2002  . DIAGNOSTIC LAPAROSCOPY    . dislocated shoulder     right  . tumor right ankle      reports that she has never smoked. She has never used smokeless tobacco. She reports that she does not drink alcohol or use drugs. family history includes Asthma in her son; Colon cancer in her maternal grandfather; Diabetes in her maternal grandmother, maternal uncle, and paternal uncle; Hypertension in her mother; Rheum arthritis in her mother. Allergies  Allergen Reactions  . Compazine Other (See Comments)    Stroke like symptoms  .  Haloperidol Decanoate Other (See Comments)    Extrapyramidal syndrome  . Penicillins Nausea And Vomiting  . Metformin And Related Other (See Comments)    GI upset  . Codeine Itching  . Lisinopril Cough   Current Outpatient Prescriptions on File Prior to Visit  Medication Sig Dispense Refill  . albuterol (PROAIR HFA) 108 (90 BASE) MCG/ACT inhaler Inhale 2 puffs into the lungs every 4 (four) hours as needed for wheezing (((PLAN B))).    Marland Kitchen albuterol (PROVENTIL) (2.5 MG/3ML) 0.083% nebulizer solution Inhale 3 mLs (2.5 mg total) into the lungs every 4 (four) hours as needed for wheezing or shortness of breath (((PLAN C))). 75 mL 2  . ALPRAZolam (XANAX) 1 MG tablet Take 1 mg by mouth 2 (two) times daily as needed for anxiety.     . budesonide-formoterol (SYMBICORT) 160-4.5 MCG/ACT inhaler Inhale 2 puffs into the lungs 2 (two) times daily. 1 Inhaler 5  . celecoxib (CELEBREX) 200 MG capsule Take 1 capsule (200 mg total) by mouth 2 (two) times daily as needed. 180 capsule 3  . Cholecalciferol (VITAMIN D3) 5000 UNITS CAPS Take by mouth every morning.     . fluticasone (FLONASE) 50 MCG/ACT nasal spray Place 2 sprays into both nostrils daily as needed for allergies or rhinitis. 1 g 11  . gabapentin (NEURONTIN) 300 MG capsule Take 1 capsule (300 mg total) by mouth 2 (two) times daily. 14 capsule 0  . HYDROcodone-acetaminophen (NORCO/VICODIN) 5-325 MG  tablet Take 1 tablet by mouth every 6 (six) hours as needed for moderate pain. 60 tablet 0  . ibuprofen (ADVIL,MOTRIN) 800 MG tablet Take 1 tablet (800 mg total) by mouth every 8 (eight) hours as needed. 60 tablet 0  . irbesartan (AVAPRO) 150 MG tablet Take 1 tablet (150 mg total) by mouth daily. 90 tablet 1  . lidocaine (LIDODERM) 5 % Place 1 patch onto the skin daily. Remove & Discard patch within 12 hours or as directed by MD 60 patch 1  . Multiple Vitamin (MULTIVITAMIN) tablet Take 1 tablet by mouth daily. Woman's MVI-TAke one daily    . nabumetone  (RELAFEN) 500 MG tablet Take 1 tablet (500 mg total) by mouth daily. 20 tablet 0  . QUEtiapine (SEROQUEL) 50 MG tablet TK 1/2 TO 1 T PO QHS  2  . Respiratory Therapy Supplies (FLUTTER) DEVI Use as directed 1 each 0  . Vitamin D, Ergocalciferol, (DRISDOL) 50000 units CAPS capsule Take 1 capsule (50,000 Units total) by mouth every 7 (seven) days. 12 capsule 0   No current facility-administered medications on file prior to visit.    Review of Systems All otherwise neg per pt     Objective:   Physical Exam BP 140/80   Pulse (!) 106   Resp 20   Wt 221 lb (100.2 kg)   SpO2 96%   BMI 36.78 kg/m  VS noted,  Constitutional: Pt appears in no apparent distress HENT: Head: NCAT.  Right Ear: External ear normal.  Left Ear: External ear normal.  Eyes: . Pupils are equal, round, and reactive to light. Conjunctivae and EOM are normal Neck: Normal range of motion. Neck supple.  Cardiovascular: Normal rate and regular rhythm.   Pulmonary/Chest: Effort normal and breath sounds without rales or wheezing.  Neurological: Pt is alert. Not confused , motor 5/5 intact with normal grasp, NT, no  Skin: Skin is warm. No rash, no LE edema Psychiatric: Pt behavior is normal. No agitation.  No other new exam findings    Assessment & Plan:

## 2016-07-04 NOTE — Progress Notes (Signed)
Pre visit review using our clinic review tool, if applicable. No additional management support is needed unless otherwise documented below in the visit note. 

## 2016-07-04 NOTE — Patient Instructions (Signed)
Your new work note and the forms are filled out today, to be faxed and you are given a copy  Please continue all other medications as before, and refills have been done if requested.  Please have the pharmacy call with any other refills you may need.  Please keep your appointments with your specialists as you may have planned

## 2016-07-06 NOTE — Assessment & Plan Note (Signed)
symptomaticallly resolved, exam benign, ok for work note

## 2016-07-06 NOTE — Assessment & Plan Note (Signed)
stable overall by history and exam, recent data reviewed with pt, and pt to continue medical treatment as before,  to f/u any worsening symptoms or concerns Lab Results  Component Value Date   HGBA1C 6.6 04/01/2016

## 2016-07-06 NOTE — Assessment & Plan Note (Signed)
stable overall by history and exam, recent data reviewed with pt, and pt to continue medical treatment as before,  to f/u any worsening symptoms or concerns BP Readings from Last 3 Encounters:  07/04/16 140/80  06/25/16 140/80  06/23/16 129/95

## 2016-07-11 DIAGNOSIS — S3210XA Unspecified fracture of sacrum, initial encounter for closed fracture: Secondary | ICD-10-CM | POA: Insufficient documentation

## 2016-07-11 DIAGNOSIS — R768 Other specified abnormal immunological findings in serum: Secondary | ICD-10-CM | POA: Insufficient documentation

## 2016-07-11 DIAGNOSIS — M19041 Primary osteoarthritis, right hand: Secondary | ICD-10-CM | POA: Insufficient documentation

## 2016-07-11 DIAGNOSIS — S322XXA Fracture of coccyx, initial encounter for closed fracture: Secondary | ICD-10-CM

## 2016-07-11 DIAGNOSIS — M19042 Primary osteoarthritis, left hand: Secondary | ICD-10-CM

## 2016-07-11 NOTE — Progress Notes (Deleted)
Office Visit Note  Patient: Gloria [name]             Date of Birth: [birth year]           MRN: [id number]             PCP: Cathlean Cower, MD Referring: Biagio Borg, MD Visit Date: 07/15/2016 Occupation: '@[email or web address]' @    Subjective:  No chief complaint on file.   History of Present Illness: Gloria [name] is a 66 y.o. female ***   Activities of Daily Living:  Patient reports morning stiffness for *** {minute/hour:19697}.   Patient {ACTIONS;DENIES/REPORTS:21021675::"Denies"} nocturnal pain.  Difficulty dressing/grooming: {ACTIONS;DENIES/REPORTS:21021675::"Denies"} Difficulty climbing stairs: {ACTIONS;DENIES/REPORTS:21021675::"Denies"} Difficulty getting out of chair: {ACTIONS;DENIES/REPORTS:21021675::"Denies"} Difficulty using hands for taps, buttons, cutlery, and/or writing: {ACTIONS;DENIES/REPORTS:21021675::"Denies"}   No Rheumatology ROS completed.   PMFS History:  Patient Active Problem List   Diagnosis Date Noted  . Positive ANA (antinuclear antibody) 07/11/2016  . Primary osteoarthritis of both hands 07/11/2016  . Fx sacrum/coccyx-closed ([place]) 07/11/2016  . Left arm pain 06/25/2016  . Polyarthralgia 03/27/2016  . Degenerative arthritis of knee, bilateral 03/27/2016  . UTI (urinary tract infection) 02/28/2016  . Low back pain 02/28/2016  . Coccyxdynia 02/28/2016  . Bilateral knee pain 02/28/2016  . Bilateral ankle pain 02/28/2016  . Encounter for preventative adult health care exam with abnormal findings 10/03/2015  . Peripheral edema 10/03/2015  . Morbid obesity ([place]) 01/01/2015  . 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 Asthma, chronic 10/17/2014  . Dyspnea 03/29/2014  . Upper airway cough syndrome 03/29/2014  . Vocal cord dysfunction 03/21/2014  . Diabetes type 2, uncontrolled ([place])   . Allergic rhinitis, cause unspecified 03/30/2013  . Chronic insomnia 09/21/2012  . Menopause 02/28/2011  . Hot flashes, menopausal 02/28/2011  . Hyperlipidemia 06/18/2010    . ARTHRITIS 06/18/2010  . ANXIETY 06/14/2010  . DEPRESSION 06/14/2010  . Migraine 06/14/2010  . Essential hypertension 06/14/2010    Past Medical History:  Diagnosis Date  . Allergic rhinitis, cause unspecified 03/30/2013  . Anxiety state, unspecified   . Arthritis   . Asthma   . Borderline diabetes mellitus    a1c 7.0 (07/26/13)   . Chronic bronchitis   . Depressive disorder, not elsewhere classified   . Diabetes mellitus without complication ([place])   . Difficulty waking    hard to wake up past sedation!  . Migraine, unspecified, without mention of intractable migraine without mention of status migrainosus   . Other and unspecified hyperlipidemia   . Panic attacks   . Postmenopausal symptoms   . Unspecified essential hypertension   . Unspecified urinary incontinence   . UTI (lower urinary tract infection)    on 02/05/16/on meds    Family History  Problem Relation Age of Onset  . Arthritis      family history  . Hypertension      grandparent  . Colon cancer Maternal Grandfather   . Asthma Son   . Rheum arthritis Mother   . Hypertension Mother   . Diabetes Maternal Uncle   . Diabetes Paternal Uncle   . Diabetes Maternal Grandmother    Past Surgical History:  Procedure Laterality Date  . ABDOMINAL HYSTERECTOMY  2002  . DIAGNOSTIC LAPAROSCOPY    . dislocated shoulder     right  . tumor right ankle     Social History   Social History Narrative   -Retired - does Company secretary   -Divorced in 2007   -One son - lives locally. 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 and entertain family. Bowling. Interior decorating           Objective: Vital Signs: There were no vitals taken for this visit.   Physical Exam   Musculoskeletal Exam: ***  CDAI Exam: No CDAI exam completed.    Investigation: Findings:  06/15/2016 CBC normal, CMP normal, UA negative, CK 124, SPEP normal, rheumatoid factor negative, ANA 1:320  homogeneous, ENA negative, C3-C4 normal, ESR 55, 03/28/2016 ESR 82, ace negative, ANA 1:320 homogeneous, CCP negative,    Imaging: Xr Hand 2 View Left  Result Date: 06/14/2016 PIP, DIP narrowing no MCP joint narrowing was noted no erosive changes were noted. Impression: These findings are consistent with mild osteoarthritis no evidence of inflammatory arthritis.  Xr Hand 2 View Right  Result Date: 06/14/2016 PIP/DIP narrowing, no MCP joint narrowing or erosive changes were noted no intercarpal joint space narrowing or erosive changes were noted. Impression these findings are consistent with mild osteoarthritis only  Xr Knee 3 View Left  Result Date: 06/14/2016 Moderate medial compartment narrowing. No chondromalacia patella. Moderate patellofemoral narrowing. Impression: These findings are consistent with moderate osteoarthritis of the knee joint and chondromalacia patella  Xr Knee 3 View Right  Result Date: 06/14/2016 Moderate medial compartment narrowing. No chondromalacia patella. Moderate patellofemoral narrowing. Impression: These findings are consistent with moderate osteoarthritis of the knee joint and chondromalacia patella   Speciality Comments: No specialty comments available.    Procedures:  No procedures performed Allergies: Compazine; Haloperidol decanoate; Penicillins; Metformin and related; Codeine; and Lisinopril   Assessment / Plan:     Visit Diagnoses: Positive ANA (antinuclear antibody) - ANA 1:320 homogeneous, ENA negative, ESR 55, RF negative, CCP negative, ace negative, no clinical features of autoimmune disease  Primary osteoarthritis of both knees - Bilateral moderate with moderate chondromalacia patella  Primary osteoarthritis of both hands - Mild  Closed fracture of sacrum and coccyx with routine healing, subsequent encounter    She has following medical problems for which she's seeing other physicians:  Uncontrolled type 2 diabetes mellitus  with diabetic neuropathy, without long-term current use of insulin (HCC)  Essential hypertension  Mixed hyperlipidemia  Upper airway cough syndrome  Morbid obesity (HCC)  Chronic insomnia  Migraine without status migrainosus, not intractable, unspecified migraine type  Family history of rheumatoid arthritis - In mother, sister and other relatives on maternal side   Orders: No orders of the defined types were placed in this encounter.  No orders of the defined types were placed in this encounter.   Face-to-face time spent with patient was *** minutes. 50% of time was spent in counseling and coordination of care.  Follow-Up Instructions: No Follow-up on file.   Bo Merino, MD  Note - This record has been created using Editor, commissioning.  Chart creation errors have been sought, but may not always  have been located. Such creation errors do not reflect on  the standard of medical care.

## 2016-07-15 ENCOUNTER — Ambulatory Visit: Payer: Self-pay | Admitting: Rheumatology

## 2016-09-06 ENCOUNTER — Ambulatory Visit (INDEPENDENT_AMBULATORY_CARE_PROVIDER_SITE_OTHER): Payer: Medicare Other | Admitting: Internal Medicine

## 2016-09-06 VITALS — BP 128/78 | HR 102 | Temp 98.3°F | Ht 66.5 in | Wt 216.0 lb

## 2016-09-06 DIAGNOSIS — R05 Cough: Secondary | ICD-10-CM | POA: Diagnosis not present

## 2016-09-06 DIAGNOSIS — R062 Wheezing: Secondary | ICD-10-CM | POA: Diagnosis not present

## 2016-09-06 DIAGNOSIS — R059 Cough, unspecified: Secondary | ICD-10-CM

## 2016-09-06 DIAGNOSIS — I10 Essential (primary) hypertension: Secondary | ICD-10-CM | POA: Diagnosis not present

## 2016-09-06 MED ORDER — LEVOFLOXACIN 250 MG PO TABS: 250.0000 mg | ORAL_TABLET | Freq: Every day | ORAL | 0 refills | Status: AC

## 2016-09-06 MED ORDER — HYDROCODONE-ACETAMINOPHEN 5-325 MG PO TABS: 1.0000 | ORAL_TABLET | Freq: Four times a day (QID) | ORAL | 0 refills | Status: DC | PRN

## 2016-09-06 NOTE — Assessment & Plan Note (Signed)
stable overall by history and exam, recent data reviewed with pt, and pt to continue medical treatment as before,  to f/u any worsening symptoms or concerns BP Readings from Last 3 Encounters:  09/06/16 128/78  07/04/16 140/80  06/25/16 140/80

## 2016-09-06 NOTE — Assessment & Plan Note (Addendum)
Mild to mod, c/w bronchitis vs pna, declines cxr, for antibx course, cough med prn,  to f/u any worsening symptoms or concerns 

## 2016-09-06 NOTE — Progress Notes (Signed)
Subjective:    Patient ID: Gloria [name], female    DOB: 08-03-1950, 66 y.o.   MRN: [id number]  HPI Here with acute onset mild to mod 2-3 days ST, HA, general weakness and malaise, with prod cough greenish sputum, but Pt denies chest pain, increased sob or doe, wheezing, orthopnea, PND, increased LE swelling, palpitations, dizziness or syncope, except for mild wheezing last PM with sob/doe, using inhaler more frequently.  Pt denies new neurological symptoms such as new headache, or facial or extremity weakness or numbness   Pt denies polydipsia, polyuria,  Past Medical History:  Diagnosis Date  . Allergic rhinitis, cause unspecified 03/30/2013  . Anxiety state, unspecified   . Arthritis   . Asthma   . Borderline diabetes mellitus    a1c 7.0 (07/26/13)   . Chronic bronchitis   . Depressive disorder, not elsewhere classified   . Diabetes mellitus without complication ([place])   . Difficulty waking    hard to wake up past sedation!  . Migraine, unspecified, without mention of intractable migraine without mention of status migrainosus   . Other and unspecified hyperlipidemia   . Panic attacks   . Postmenopausal symptoms   . Unspecified essential hypertension   . Unspecified urinary incontinence   . UTI (lower urinary tract infection)    on 02/05/16/on meds   Past Surgical History:  Procedure Laterality Date  . ABDOMINAL HYSTERECTOMY  2002  . DIAGNOSTIC LAPAROSCOPY    . dislocated shoulder     right  . tumor right ankle      reports that she has never smoked. She has never used smokeless tobacco. She reports that she does not drink alcohol or use drugs. family history includes Asthma in her son; Colon cancer in her maternal grandfather; Diabetes in her maternal grandmother, maternal uncle, and paternal uncle; Hypertension in her mother; Rheum arthritis in her mother. Allergies  Allergen Reactions  . Compazine Other (See Comments)    Stroke like symptoms  . Haloperidol Decanoate Other  (See Comments)    Extrapyramidal syndrome  . Penicillins Nausea And Vomiting  . Metformin And Related Other (See Comments)    GI upset  . Codeine Itching  . Lisinopril Cough   Current Outpatient Prescriptions on File Prior to Visit  Medication Sig Dispense Refill  . albuterol (PROAIR HFA) 108 (90 BASE) MCG/ACT inhaler Inhale 2 puffs into the lungs every 4 (four) hours as needed for wheezing (((PLAN B))).    Marland Kitchen albuterol (PROVENTIL) (2.5 MG/3ML) 0.083% nebulizer solution Inhale 3 mLs (2.5 mg total) into the lungs every 4 (four) hours as needed for wheezing or shortness of breath (((PLAN C))). 75 mL 2  . ALPRAZolam (XANAX) 1 MG tablet Take 1 mg by mouth 2 (two) times daily as needed for anxiety.     . budesonide-formoterol (SYMBICORT) 160-4.5 MCG/ACT inhaler Inhale 2 puffs into the lungs 2 (two) times daily. 1 Inhaler 5  . celecoxib (CELEBREX) 200 MG capsule Take 1 capsule (200 mg total) by mouth 2 (two) times daily as needed. 180 capsule 3  . gabapentin (NEURONTIN) 300 MG capsule Take 1 capsule (300 mg total) by mouth 2 (two) times daily. 14 capsule 0  . ibuprofen (ADVIL,MOTRIN) 800 MG tablet Take 1 tablet (800 mg total) by mouth every 8 (eight) hours as needed. 60 tablet 0  . irbesartan (AVAPRO) 150 MG tablet Take 1 tablet (150 mg total) by mouth daily. 90 tablet 1  . lidocaine (LIDODERM) 5 % Place 1 patch onto the  skin daily. Remove & Discard patch within 12 hours or as directed by MD 60 patch 1  . Multiple Vitamin (MULTIVITAMIN) tablet Take 1 tablet by mouth daily. Woman's MVI-TAke one daily    . Respiratory Therapy Supplies (FLUTTER) DEVI Use as directed 1 each 0  . Vitamin D, Ergocalciferol, (DRISDOL) 50000 units CAPS capsule Take 1 capsule (50,000 Units total) by mouth every 7 (seven) days. 12 capsule 0   No current facility-administered medications on file prior to visit.    Review of Systems  Constitutional: Negative for unusual diaphoresis or night sweats HENT: Negative for ear  swelling or discharge Eyes: Negative for worsening visual haziness  Respiratory: Negative for choking and stridor.   Gastrointestinal: Negative for distension or worsening eructation Genitourinary: Negative for retention or change in urine volume.  Musculoskeletal: Negative for other MSK pain or swelling Skin: Negative for color change and worsening wound Neurological: Negative for tremors and numbness other than noted  Psychiatric/Behavioral: Negative for decreased concentration or agitation other than above   All other system neg per pt    Objective:   Physical Exam BP 128/78   Pulse (!) 102   Temp 98.3 F (36.8 C)   Ht 5' 6.5" (1.689 m)   Wt 216 lb (98 kg)   SpO2 100%   BMI 34.34 kg/m  VS noted, mild ill Constitutional: Pt appears in no apparent distress HENT: Head: NCAT.  Right Ear: External ear normal.  Left Ear: External ear normal.  Eyes: . Pupils are equal, round, and reactive to light. Conjunctivae and EOM are normal Bilat tm's with mild erythema.  Max sinus areas non tender.  Pharynx with mild erythema, no exudate Neck: Normal range of motion. Neck supple.  Cardiovascular: Normal rate and regular rhythm.   Pulmonary/Chest: Effort normal and breath sounds decreased without rales or wheezing.  Neurological: Pt is alert. Not confused , motor grossly intact Skin: Skin is warm. No rash, no LE edema Psychiatric: Pt behavior is normal. No agitation.  No other exam findings    Assessment & Plan:

## 2016-09-06 NOTE — Assessment & Plan Note (Signed)
Mild to mod with increased inhaler use, declines depomedrol or prednisone,  to f/u any worsening symptoms or concerns

## 2016-09-06 NOTE — Patient Instructions (Signed)
Please take all new medication as prescribed - the antibiotic, and cough medicine  Please continue all other medications as before, and refills have been done if requested.  Please have the pharmacy call with any other refills you may need.  Please keep your appointments with your specialists as you may have planned      

## 2016-09-20 ENCOUNTER — Other Ambulatory Visit: Payer: Self-pay | Admitting: Family Medicine

## 2016-10-04 ENCOUNTER — Other Ambulatory Visit: Payer: Self-pay | Admitting: Internal Medicine

## 2016-10-19 ENCOUNTER — Other Ambulatory Visit: Payer: Self-pay | Admitting: Internal Medicine

## 2016-11-04 ENCOUNTER — Ambulatory Visit (INDEPENDENT_AMBULATORY_CARE_PROVIDER_SITE_OTHER): Payer: Medicare Other | Admitting: Internal Medicine

## 2016-11-04 ENCOUNTER — Encounter: Payer: Self-pay | Admitting: Internal Medicine

## 2016-11-04 ENCOUNTER — Other Ambulatory Visit (INDEPENDENT_AMBULATORY_CARE_PROVIDER_SITE_OTHER): Payer: Medicare Other

## 2016-11-04 VITALS — BP 146/88 | HR 78 | Temp 98.9°F | Resp 14 | Ht 66.5 in | Wt 215.0 lb

## 2016-11-04 DIAGNOSIS — E1165 Type 2 diabetes mellitus with hyperglycemia: Secondary | ICD-10-CM | POA: Diagnosis not present

## 2016-11-04 DIAGNOSIS — R350 Frequency of micturition: Secondary | ICD-10-CM

## 2016-11-04 DIAGNOSIS — E1169 Type 2 diabetes mellitus with other specified complication: Secondary | ICD-10-CM

## 2016-11-04 DIAGNOSIS — E669 Obesity, unspecified: Secondary | ICD-10-CM

## 2016-11-04 DIAGNOSIS — IMO0002 Reserved for concepts with insufficient information to code with codable children: Secondary | ICD-10-CM

## 2016-11-04 DIAGNOSIS — N3000 Acute cystitis without hematuria: Secondary | ICD-10-CM | POA: Diagnosis not present

## 2016-11-04 DIAGNOSIS — E1140 Type 2 diabetes mellitus with diabetic neuropathy, unspecified: Secondary | ICD-10-CM

## 2016-11-04 DIAGNOSIS — I10 Essential (primary) hypertension: Secondary | ICD-10-CM | POA: Diagnosis not present

## 2016-11-04 LAB — POC URINALSYSI DIPSTICK (AUTOMATED)
Bilirubin, UA: NEGATIVE
Blood, UA: NEGATIVE
Ketones, UA: NEGATIVE
Leukocytes, UA: NEGATIVE
Nitrite, UA: POSITIVE
Protein, UA: NEGATIVE
Spec Grav, UA: 1.01 (ref 1.010–1.025)
Urobilinogen, UA: 0.2 E.U./dL
pH, UA: 6 (ref 5.0–8.0)

## 2016-11-04 LAB — COMPREHENSIVE METABOLIC PANEL
ALT: 23 U/L (ref 0–35)
AST: 15 U/L (ref 0–37)
Albumin: 3.9 g/dL (ref 3.5–5.2)
Alkaline Phosphatase: 117 U/L (ref 39–117)
BUN: 6 mg/dL (ref 6–23)
CO2: 28 mEq/L (ref 19–32)
Calcium: 9.1 mg/dL (ref 8.4–10.5)
Chloride: 96 mEq/L (ref 96–112)
Creatinine, Ser: 0.71 mg/dL (ref 0.40–1.20)
GFR: 105.8 mL/min (ref >60.00)
Glucose, Bld: 457 mg/dL — ABNORMAL HIGH (ref 70–99)
Potassium: 3.3 mEq/L — ABNORMAL LOW (ref 3.5–5.1)
Sodium: 131 mEq/L — ABNORMAL LOW (ref 135–145)
Total Bilirubin: 0.3 mg/dL (ref 0.2–1.2)
Total Protein: 7.5 g/dL (ref 6.0–8.3)

## 2016-11-04 LAB — HEMOGLOBIN A1C: Hgb A1c MFr Bld: 11.4 % — ABNORMAL HIGH (ref 4.6–6.5)

## 2016-11-04 MED ORDER — NITROFURANTOIN MONOHYD MACRO 100 MG PO CAPS: 100.0000 mg | ORAL_CAPSULE | Freq: Two times a day (BID) | ORAL | 0 refills | Status: DC

## 2016-11-04 NOTE — Assessment & Plan Note (Signed)
Due to significant glucose in her urine as well as no recent HgA1c reading checking HgA1c today and will forward to PCP. Not on meds currently.

## 2016-11-04 NOTE — Assessment & Plan Note (Signed)
Doubt that BP is truly that high at home without symptoms. Her BP have been fairly normal in the office over the last several months when she insists that they are running that high at home. Could be slightly elevated due to UTI today. Will check CMP today and adjust if needed. Asked her to bring in cuff for validation of readings and make sure she is using correctly (some wrist cuffs have a different arm placement to get accurate readings). No signs of hypertensive urgency/emergency in the office. Continue irbesartan as before.

## 2016-11-04 NOTE — Progress Notes (Signed)
Pre visit review using our clinic review tool, if applicable. No additional management support is needed unless otherwise documented below in the visit note. 

## 2016-11-04 NOTE — Patient Instructions (Signed)
We have sent in an antibiotic called macrobid for the urine infection. Take 1 pill twice a day for 1 week.   We will check the blood work today.   We would like you to bring the blood pressure cuff in to see if it is accurate or if you are using properly.

## 2016-11-04 NOTE — Assessment & Plan Note (Signed)
U/A done in the office consistent with UTI and treating with macrobid in case this is causing BP to be elevated.

## 2016-11-04 NOTE — Progress Notes (Signed)
   Subjective:    Patient ID: Gloria [name], female    DOB: 10-27-1950, 66 y.o.   MRN: [id number]  HPI The patient is a 66 YO female coming in for high BP at home. She has been taking her current BP meds including her irbesartan. She checked it at home yesterday and today and the readings were much higher than normal at 190/110 and 160/115 in the last two days. She denies chest pains, some mild malaise. No headaches although she sometimes does get headaches. Upon further questioning she admits that her blood pressures have been running this high at home for several months including going on at the time of her last visit with PCP.  Next concern is that she thinks she is having a UTI at this time. Symptoms started several days ago with frequency and urgency. She denies specific burning but some pressure suprapubic. No back pain. No fevers or chills. Some malaise. Denies trying anything for the symptoms.   Review of Systems  Constitutional: Positive for fatigue. Negative for activity change, appetite change, chills, fever and unexpected weight change.  HENT: Negative.   Eyes: Negative.   Respiratory: Negative for cough, chest tightness and shortness of breath.   Cardiovascular: Negative for chest pain, palpitations and leg swelling.  Gastrointestinal: Negative for abdominal distention, abdominal pain, constipation, diarrhea, nausea and vomiting.  Genitourinary: Positive for frequency and urgency. Negative for decreased urine volume, difficulty urinating, dysuria, flank pain, hematuria, vaginal bleeding, vaginal discharge and vaginal pain.  Musculoskeletal: Positive for arthralgias. Negative for back pain, gait problem, myalgias and neck pain.  Skin: Negative.   Neurological: Negative.   Psychiatric/Behavioral: Negative.      Objective:   Physical Exam  Constitutional: She is oriented to person, place, and time. She appears well-developed and well-nourished.  HENT:  Head: Normocephalic and  atraumatic.  Eyes: Conjunctivae and EOM are normal. Pupils are equal, round, and reactive to light.  Neck: Normal range of motion.  Cardiovascular: Normal rate and regular rhythm.   Pulmonary/Chest: Effort normal and breath sounds normal. No respiratory distress. She has no wheezes. She has no rales.  Abdominal: Soft. Bowel sounds are normal. She exhibits no distension. There is no tenderness. There is no rebound.  Musculoskeletal: She exhibits no edema.  Neurological: She is alert and oriented to person, place, and time. Coordination normal.  Skin: Skin is warm and dry.  Psychiatric: She has a normal mood and affect.   Vitals:   11/04/16 1300 11/04/16 1328  BP: (!) 144/92 (!) 146/88  Pulse: 78   Resp: 14   Temp: 98.9 F (37.2 C)   TempSrc: Oral   Weight: 215 lb (97.5 kg)   Height: 5' 6.5" (1.689 m)       Assessment & Plan:

## 2016-11-05 ENCOUNTER — Telehealth: Payer: Self-pay | Admitting: Internal Medicine

## 2016-11-05 NOTE — Telephone Encounter (Signed)
error 

## 2016-11-06 ENCOUNTER — Ambulatory Visit (INDEPENDENT_AMBULATORY_CARE_PROVIDER_SITE_OTHER): Payer: Medicare Other | Admitting: Internal Medicine

## 2016-11-06 ENCOUNTER — Encounter: Payer: Self-pay | Admitting: Internal Medicine

## 2016-11-06 VITALS — BP 152/102 | HR 94 | Ht 66.5 in | Wt 213.0 lb

## 2016-11-06 DIAGNOSIS — E1140 Type 2 diabetes mellitus with diabetic neuropathy, unspecified: Secondary | ICD-10-CM | POA: Diagnosis not present

## 2016-11-06 DIAGNOSIS — I10 Essential (primary) hypertension: Secondary | ICD-10-CM | POA: Diagnosis not present

## 2016-11-06 DIAGNOSIS — Z0000 Encounter for general adult medical examination without abnormal findings: Secondary | ICD-10-CM

## 2016-11-06 DIAGNOSIS — IMO0002 Reserved for concepts with insufficient information to code with codable children: Secondary | ICD-10-CM

## 2016-11-06 DIAGNOSIS — E1165 Type 2 diabetes mellitus with hyperglycemia: Secondary | ICD-10-CM

## 2016-11-06 DIAGNOSIS — G4710 Hypersomnia, unspecified: Secondary | ICD-10-CM | POA: Diagnosis not present

## 2016-11-06 DIAGNOSIS — E782 Mixed hyperlipidemia: Secondary | ICD-10-CM

## 2016-11-06 MED ORDER — IRBESARTAN 300 MG PO TABS
300.0000 mg | ORAL_TABLET | Freq: Every day | ORAL | 3 refills | Status: DC
Start: 2016-11-06 — End: 2018-05-11

## 2016-11-06 MED ORDER — GLIPIZIDE ER 10 MG PO TB24: 10.0000 mg | ORAL_TABLET | Freq: Every day | ORAL | 3 refills | Status: DC

## 2016-11-06 NOTE — Progress Notes (Signed)
Subjective:    Patient ID: Gloria [name], female    DOB: 1951-04-25, 66 y.o.   MRN: [id number]  HPI  Here to f/u; overall doing ok,  Pt denies chest pain, increasing sob or doe, wheezing, orthopnea, PND, increased LE swelling, palpitations, dizziness or syncope.  Pt denies new neurological symptoms such as new headache, or facial or extremity weakness or numbness.  Pt denies polydipsia, polyuria, or low sugar episode.   Pt denies new neurological symptoms such as new headache, or facial or extremity weakness or numbness.   Pt states overall good compliance with meds, mostly trying to follow appropriate diet, with wt overall stable,  but little exercise however. Has been off metformin since oct 2018 as did not appear to further need per endo. BP Readings from Last 3 Encounters:  11/06/16 (!) 152/102  11/04/16 (!) 146/88  09/06/16 128/78  Home cuff 190/116 this am Wt Readings from Last 3 Encounters:  11/06/16 213 lb (96.6 kg)  11/04/16 215 lb (97.5 kg)  09/06/16 216 lb (98 kg)  Does c/o ongoing fatigue, but also has worseiningsignficant daytime hypersomnolence. Past Medical History:  Diagnosis Date  . Allergic rhinitis, cause unspecified 03/30/2013  . Anxiety state, unspecified   . Arthritis   . Asthma   . Borderline diabetes mellitus    a1c 7.0 (07/26/13)   . Chronic bronchitis   . Depressive disorder, not elsewhere classified   . Diabetes mellitus without complication ([place])   . Difficulty waking    hard to wake up past sedation!  . Migraine, unspecified, without mention of intractable migraine without mention of status migrainosus   . Other and unspecified hyperlipidemia   . Panic attacks   . Postmenopausal symptoms   . Unspecified essential hypertension   . Unspecified urinary incontinence   . UTI (lower urinary tract infection)    on 02/05/16/on meds   Past Surgical History:  Procedure Laterality Date  . ABDOMINAL HYSTERECTOMY  2002  . DIAGNOSTIC LAPAROSCOPY    . dislocated  shoulder     right  . tumor right ankle      reports that she has never smoked. She has never used smokeless tobacco. She reports that she does not drink alcohol or use drugs. family history includes Asthma in her son; Colon cancer in her maternal grandfather; Diabetes in her maternal grandmother, maternal uncle, and paternal uncle; Hypertension in her mother; Rheum arthritis in her mother. Allergies  Allergen Reactions  . Compazine Other (See Comments)    Stroke like symptoms  . Haloperidol Decanoate Other (See Comments)    Extrapyramidal syndrome  . Penicillins Nausea And Vomiting  . Metformin And Related Other (See Comments)    GI upset  . Codeine Itching  . Lisinopril Cough   Current Outpatient Prescriptions on File Prior to Visit  Medication Sig Dispense Refill  . albuterol (PROAIR HFA) 108 (90 BASE) MCG/ACT inhaler Inhale 2 puffs into the lungs every 4 (four) hours as needed for wheezing (((PLAN B))).    Marland Kitchen albuterol (PROVENTIL) (2.5 MG/3ML) 0.083% nebulizer solution Inhale 3 mLs (2.5 mg total) into the lungs every 4 (four) hours as needed for wheezing or shortness of breath (((PLAN C))). 75 mL 2  . ALPRAZolam (XANAX) 1 MG tablet Take 1 mg by mouth 2 (two) times daily as needed for anxiety.     . budesonide-formoterol (SYMBICORT) 160-4.5 MCG/ACT inhaler Inhale 2 puffs into the lungs 2 (two) times daily. 1 Inhaler 5  . celecoxib (CELEBREX) 200 MG capsule  Take 1 capsule (200 mg total) by mouth 2 (two) times daily as needed. 180 capsule 3  . gabapentin (NEURONTIN) 300 MG capsule Take 1 capsule (300 mg total) by mouth 2 (two) times daily. 14 capsule 0  . HYDROcodone-acetaminophen (NORCO/VICODIN) 5-325 MG tablet Take 1 tablet by mouth every 6 (six) hours as needed for moderate pain. 60 tablet 0  . ibuprofen (ADVIL,MOTRIN) 800 MG tablet TAKE 1 TABLET(800 MG) BY MOUTH EVERY 8 HOURS AS NEEDED 60 tablet 0  . lidocaine (LIDODERM) 5 % Place 1 patch onto the skin daily. Remove & Discard patch  within 12 hours or as directed by MD 60 patch 1  . Multiple Vitamin (MULTIVITAMIN) tablet Take 1 tablet by mouth daily. Woman's MVI-TAke one daily    . nitrofurantoin, macrocrystal-monohydrate, (MACROBID) 100 MG capsule Take 1 capsule (100 mg total) by mouth 2 (two) times daily. 14 capsule 0  . Respiratory Therapy Supplies (FLUTTER) DEVI Use as directed 1 each 0   No current facility-administered medications on file prior to visit.    Review of Systems  Constitutional: Negative for other unusual diaphoresis or sweats HENT: Negative for ear discharge or swelling Eyes: Negative for other worsening visual disturbances Respiratory: Negative for stridor or other swelling  Gastrointestinal: Negative for worsening distension or other blood Genitourinary: Negative for retention or other urinary change Musculoskeletal: Negative for other MSK pain or swelling Skin: Negative for color change or other new lesions Neurological: Negative for worsening tremors and other numbness  Psychiatric/Behavioral: Negative for worsening agitation or other fatigue All other system neg per pt    Objective:   Physical Exam BP (!) 152/102   Pulse 94   Ht 5' 6.5" (1.689 m)   Wt 213 lb (96.6 kg)   SpO2 100%   BMI 33.86 kg/m  VS noted, not ill appearing, non toxic, obese Constitutional: Pt appears in NAD HENT: Head: NCAT.  Right Ear: External ear normal.  Left Ear: External ear normal.  Eyes: . Pupils are equal, round, and reactive to light. Conjunctivae and EOM are normal Nose: without d/c or deformity Neck: Neck supple. Gross normal ROM Cardiovascular: Normal rate and regular rhythm.   Pulmonary/Chest: Effort normal and breath sounds without rales or wheezing.  Neurological: Pt is alert. At baseline orientation, motor grossly intact Skin: Skin is warm. No rashes, other new lesions, no LE edema Psychiatric: Pt behavior is normal without agitation  No other exam findings Lab Results  Component Value Date     WBC 11.0 (H) 06/14/2016   HGB 11.8 06/14/2016   HCT 36.9 06/14/2016   PLT 335 06/14/2016   GLUCOSE 457 (H) 11/04/2016   CHOL 140 10/03/2015   TRIG 256.0 (H) 10/03/2015   HDL 37.50 (L) 10/03/2015   LDLDIRECT 64.0 10/03/2015   ALT 23 11/04/2016   AST 15 11/04/2016   NA 131 (L) 11/04/2016   K 3.3 (L) 11/04/2016   CL 96 11/04/2016   CREATININE 0.71 11/04/2016   BUN 6 11/04/2016   CO2 28 11/04/2016   TSH 3.26 03/27/2016   HGBA1C 11.4 (H) 11/04/2016   MICROALBUR <0.7 10/03/2015      Assessment & Plan:

## 2016-11-06 NOTE — Patient Instructions (Signed)
OK to increase the avapro to 300 mg per day  Please take all new medication as prescribed - the glipizide XL 10 mg per day  Please restart checking your sugars  You will be contacted regarding the referral for: Endocrinology, and Pulmonary  Please continue all other medications as before, and refills have been done if requested.  Please have the pharmacy call with any other refills you may need.  Please continue your efforts at being more active, low cholesterol diabetic diet, and weight control.  Please keep your appointments with your specialists as you may have planned

## 2016-11-10 DIAGNOSIS — G4710 Hypersomnia, unspecified: Secondary | ICD-10-CM | POA: Insufficient documentation

## 2016-11-10 NOTE — Assessment & Plan Note (Addendum)
c/w severe uncontrolled, to start glipizide ER 10 qd, o/w stable overall by history and exam,  and pt to continue medical treatment as before,  to f/u any worsening symptoms or concerns, also for endo referral

## 2016-11-10 NOTE — Assessment & Plan Note (Signed)
Uncontrolled, for increased avapro to 300 qd,  to f/u any worsening symptoms or concerns, f/u BP at home and next visit

## 2016-11-10 NOTE — Assessment & Plan Note (Signed)
stable overall by history and exam, and pt to continue medical treatment as before,  to f/u any worsening symptoms or concerns, for f/u lab today 

## 2016-11-10 NOTE — Assessment & Plan Note (Signed)
Cant f/o OSA - for pulm referral for evaluation

## 2016-11-12 ENCOUNTER — Telehealth: Payer: Self-pay | Admitting: Internal Medicine

## 2016-11-12 DIAGNOSIS — R300 Dysuria: Secondary | ICD-10-CM

## 2016-11-12 NOTE — Telephone Encounter (Signed)
She had U/A and labs ordered for her by her PCP Dr. Jenny Reichmann after she saw him. Should get those done.

## 2016-11-12 NOTE — Telephone Encounter (Signed)
patient contacted and stated awareness

## 2016-11-12 NOTE — Telephone Encounter (Signed)
States she seen Dr. Sharlet Salina on 5/7 for UTI.  Patient states she has completed the medication prescribed but she still has a lot of burning.  Would like to know if Dr. Sharlet Salina would prescribe something else?

## 2016-11-15 ENCOUNTER — Encounter: Payer: Self-pay | Admitting: Pulmonary Disease

## 2016-11-15 ENCOUNTER — Ambulatory Visit (INDEPENDENT_AMBULATORY_CARE_PROVIDER_SITE_OTHER): Payer: Medicare Other | Admitting: Pulmonary Disease

## 2016-11-15 VITALS — BP 152/94 | HR 97 | Ht 67.0 in | Wt 214.0 lb

## 2016-11-15 DIAGNOSIS — R0683 Snoring: Secondary | ICD-10-CM | POA: Diagnosis not present

## 2016-11-15 DIAGNOSIS — J45909 Unspecified asthma, uncomplicated: Secondary | ICD-10-CM

## 2016-11-15 DIAGNOSIS — R29818 Other symptoms and signs involving the nervous system: Secondary | ICD-10-CM

## 2016-11-15 MED ORDER — MONTELUKAST SODIUM 10 MG PO TABS: 10.0000 mg | ORAL_TABLET | Freq: Every day | ORAL | 5 refills | Status: DC

## 2016-11-15 NOTE — Progress Notes (Signed)
Past Surgical History She  has a past surgical history that includes Abdominal hysterectomy (2002); dislocated shoulder; Diagnostic laparoscopy; and tumor right ankle.  Allergies  Allergen Reactions  . Compazine Other (See Comments)    Stroke like symptoms  . Haloperidol Decanoate Other (See Comments)    Extrapyramidal syndrome  . Penicillins Nausea And Vomiting  . Metformin And Related Other (See Comments)    GI upset  . Codeine Itching  . Lisinopril Cough    Family History Her family history includes Asthma in her son; Colon cancer in her maternal grandfather; Diabetes in her maternal grandmother, maternal uncle, and paternal uncle; Hypertension in her mother; Rheum arthritis in her mother.  Social History She  reports that she has never smoked. She has never used smokeless tobacco. She reports that she does not drink alcohol or use drugs.  Review of systems  Constitutional: Negative for fever and unexpected weight change.  HENT: Negative for congestion, dental problem, ear pain, nosebleeds, postnasal drip, rhinorrhea, sinus pressure, sneezing, sore throat and trouble swallowing.   Eyes: Negative for redness and itching.  Respiratory: Positive for shortness of breath. Negative for cough, chest tightness and wheezing.   Cardiovascular: Positive for leg swelling. Negative for palpitations.  Gastrointestinal: Negative for nausea and vomiting.  Genitourinary: Negative for dysuria.  Musculoskeletal: Negative for joint swelling.  Skin: Negative for rash.  Neurological: Negative for headaches.  Hematological: Does not bruise/bleed easily.  Psychiatric/Behavioral: Negative for dysphoric mood. The patient is nervous/anxious.     Current Outpatient Prescriptions on File Prior to Visit  Medication Sig  . albuterol (PROAIR HFA) 108 (90 BASE) MCG/ACT inhaler Inhale 2 puffs into the lungs every 4 (four) hours as needed for wheezing (((PLAN B))).  Marland Kitchen albuterol (PROVENTIL) (2.5 MG/3ML)  0.083% nebulizer solution Inhale 3 mLs (2.5 mg total) into the lungs every 4 (four) hours as needed for wheezing or shortness of breath (((PLAN C))).  Marland Kitchen ALPRAZolam (XANAX) 1 MG tablet Take 1 mg by mouth 2 (two) times daily as needed for anxiety.   . budesonide-formoterol (SYMBICORT) 160-4.5 MCG/ACT inhaler Inhale 2 puffs into the lungs 2 (two) times daily.  Marland Kitchen glipiZIDE (GLIPIZIDE XL) 10 MG 24 hr tablet Take 1 tablet (10 mg total) by mouth daily with breakfast.  . HYDROcodone-acetaminophen (NORCO/VICODIN) 5-325 MG tablet Take 1 tablet by mouth every 6 (six) hours as needed for moderate pain.  Marland Kitchen ibuprofen (ADVIL,MOTRIN) 800 MG tablet TAKE 1 TABLET(800 MG) BY MOUTH EVERY 8 HOURS AS NEEDED  . irbesartan (AVAPRO) 300 MG tablet Take 1 tablet (300 mg total) by mouth daily.  Marland Kitchen lidocaine (LIDODERM) 5 % Place 1 patch onto the skin daily. Remove & Discard patch within 12 hours or as directed by MD  . Multiple Vitamin (MULTIVITAMIN) tablet Take 1 tablet by mouth daily. Woman's MVI-TAke one daily  . Respiratory Therapy Supplies (FLUTTER) DEVI Use as directed  . gabapentin (NEURONTIN) 300 MG capsule Take 1 capsule (300 mg total) by mouth 2 (two) times daily. (Patient not taking: Reported on 11/15/2016)   No current facility-administered medications on file prior to visit.     Chief Complaint  Patient presents with  . SLEEP CONSULT    Referred by Dr Jenny Reichmann. Epworth Score: 2    Tests: Echo 03/21/16 >> EF 50%, grade 2 DD, mild MR  Past medical history She  has a past medical history of Allergic rhinitis, cause unspecified (03/30/2013); Anxiety state, unspecified; Arthritis; Asthma; Borderline diabetes mellitus; Chronic bronchitis; Depressive disorder, not elsewhere classified;  Diabetes mellitus without complication ([place]); Difficulty waking; Migraine, unspecified, without mention of intractable migraine without mention of status migrainosus; Other and unspecified hyperlipidemia; Panic attacks; Postmenopausal  symptoms; Unspecified essential hypertension; Unspecified urinary incontinence; and UTI (lower urinary tract infection).  Vital signs BP (!) 152/94 (BP Location: Left Arm, Cuff Size: Normal)   Pulse 97   Ht 5\' 7"  (1.702 m)   Wt 214 lb (97.1 kg)   SpO2 99%   BMI 33.52 kg/m   History of Present Illness Gloria [name] is a 66 y.o. female for evaluation of sleep problems.  She has noticed more trouble with her sleep.  She snores, and will wake up feeling choked.  She can't sleep on her back.  Her mouth gets dry.  She doesn't dream much.  She feels more tired during the day.  She goes to sleep at 11 pm.  She falls asleep after 30 minutes.  She wakes up 2 or 3 times to use the bathroom.  She gets out of bed at 730 am.  She feels tired in the morning.  She denies morning headache.  She takes xanax at night for anxiety, and this helps her sleep.  She drinks coffee in the morning.  She drinks several sodas during the day, but is trying to cut this down.  She denies sleep walking, sleep talking, bruxism, or nightmares.  There is no history of restless legs.  She denies sleep hallucinations, sleep paralysis, or cataplexy.  The Epworth score is 2 out of 24.  She has also noticed more trouble her breathing during the day.  She gets tight in her chest, and has a cough.  She has been using symbicort bid.  She hasn't been using albuterol.  She also gets some sinus congestion and post nasal drip, especially this time of year.   Physical Exam:  General - No distress Eyes - pupils reactive, wears glasses ENT - No sinus tenderness, no oral exudate, no LAN, no thyromegaly, TM clear, pupils equal/reactive, MP 3, dental work Cardiac - s1s2 regular, no murmur, pulses symmetric Chest - faint b/l expiratory wheeze clear with cough Back - No focal tenderness Abd - Soft, non-tender, no organomegaly, + bowel sounds Ext - No edema Neuro - Normal strength, cranial nerves intact Skin - No rashes Psych - Normal  mood, and behavior  Discussion: She has snoring, sleep disruption, apnea, and daytime sleepiness.  She has hx of DM, depression, and hypertension.  I am concerned she could have sleep apnea.  We discussed how sleep apnea can affect various health problems, including risks for hypertension, cardiovascular disease, and diabetes.  We also discussed how sleep disruption can increase risks for accidents, such as while driving.  Weight loss as a means of improving sleep apnea was also reviewed.  Additional treatment options discussed were CPAP therapy, oral appliance, and surgical intervention.  She has allergic asthma with increased symptoms recently likely to do excess pollen.  Assessment/plan:  Snoring with concern for sleep apnea. - will arrange for home sleep study  Allergic asthma. - will have her try singulair - continue symbicort - discussed when she should try using albuterol   Patient Instructions  Montelukast (singulair) 10 mg pill nightly  Will arrange for home sleep study  Will arrange for follow up after home sleep study reviewed    Chesley Mires, M.D. Pager [phone] 11/15/2016, 11:59 AM

## 2016-11-15 NOTE — Patient Instructions (Signed)
Montelukast (singulair) 10 mg pill nightly  Will arrange for home sleep study  Will arrange for follow up after home sleep study reviewed

## 2016-11-15 NOTE — Progress Notes (Signed)
   Subjective:    Patient ID: Gloria [name], female    DOB: [birth year], 66 y.o.   MRN: [id number]  HPI    Review of Systems  Constitutional: Negative for fever and unexpected weight change.  HENT: Negative for congestion, dental problem, ear pain, nosebleeds, postnasal drip, rhinorrhea, sinus pressure, sneezing, sore throat and trouble swallowing.   Eyes: Negative for redness and itching.  Respiratory: Positive for shortness of breath. Negative for cough, chest tightness and wheezing.   Cardiovascular: Positive for leg swelling. Negative for palpitations.  Gastrointestinal: Negative for nausea and vomiting.  Genitourinary: Negative for dysuria.  Musculoskeletal: Negative for joint swelling.  Skin: Negative for rash.  Neurological: Negative for headaches.  Hematological: Does not bruise/bleed easily.  Psychiatric/Behavioral: Negative for dysphoric mood. The patient is nervous/anxious.        Objective:   Physical Exam        Assessment & Plan:

## 2016-11-22 ENCOUNTER — Encounter: Payer: Self-pay | Admitting: Internal Medicine

## 2016-11-22 ENCOUNTER — Ambulatory Visit (INDEPENDENT_AMBULATORY_CARE_PROVIDER_SITE_OTHER): Payer: Medicare Other | Admitting: Internal Medicine

## 2016-11-22 VITALS — BP 132/90 | HR 101 | Wt 213.0 lb

## 2016-11-22 DIAGNOSIS — IMO0002 Reserved for concepts with insufficient information to code with codable children: Secondary | ICD-10-CM

## 2016-11-22 DIAGNOSIS — E1165 Type 2 diabetes mellitus with hyperglycemia: Secondary | ICD-10-CM

## 2016-11-22 DIAGNOSIS — E1140 Type 2 diabetes mellitus with diabetic neuropathy, unspecified: Secondary | ICD-10-CM | POA: Diagnosis not present

## 2016-11-22 MED ORDER — INSULIN GLARGINE 100 UNIT/ML SOLOSTAR PEN: 12.0000 [IU] | PEN_INJECTOR | Freq: Every day | SUBCUTANEOUS | 11 refills | Status: DC

## 2016-11-22 MED ORDER — PRODIGY VOICE BLOOD GLUCOSE W/DEVICE KIT: PACK | 1 refills | Status: DC

## 2016-11-22 MED ORDER — INSULIN PEN NEEDLE 32G X 4 MM MISC: 2 refills | Status: DC

## 2016-11-22 NOTE — Progress Notes (Addendum)
Patient ID: Gloria [name], female   DOB: 1950-09-24, 66 y.o.   MRN: [id number]   HPI: Lucine Bilski is a 66 y.o.-year-old female, returning for DM2, dx in 2015, non-insulin-dependent, uncontrolled, with complications (peripheral neuropathy). Last visit 7 months ago, she did not return as advised.  She had the HbA1c this month which was almost double of the one she had when I saw her last. Therefore, she returns for another visit.  Last hemoglobin A1c was: Lab Results  Component Value Date   HGBA1C 11.4 (H) 11/04/2016   HGBA1C 6.6 04/01/2016   HGBA1C 6.9 (H) 10/03/2015  She has asthma  - at change of season  - 2x a year >> Prednisone. She also had knee steroid inj's.   Pt started: - Glipizide 10 mg 1x a day before b'fast - started 2 weeks ago. She was on Metformin >> diarrhea. Stopped 6 month before last visit. Was on this for 5-6 mo.  Pt did not check her sugars in 6 month before our last visit >> now 1x a day: - am: n/c >> 160-200 - 2h after b'fast: n/c - before lunch: n/c - 2h after lunch: n/c - before dinner: n/c - 2h after dinner: n/c - bedtime: n/c - nighttime: n/c No lows; ? hypoglycemia awareness. Highest sugar was 200.  Glucometer: Prodigy (talking meter)  Pt's meals are: - Breakfast: oatmeal + berries or banana, coffee + creamer, OJ - Lunch: salad, hamburger - Dinner: fish, chicken - Snacks: 1 She stopped fresh fries, sweets. She drinks diet sodas. No exercise.  - No CKD, last BUN/creatinine:  Lab Results  Component Value Date   BUN 6 11/04/2016   BUN 9 06/14/2016   CREATININE 0.71 11/04/2016   CREATININE 0.69 06/14/2016   - + HL; last set of lipids: Lab Results  Component Value Date   CHOL 140 10/03/2015   HDL 37.50 (L) 10/03/2015   LDLDIRECT 64.0 10/03/2015   TRIG 256.0 (H) 10/03/2015   CHOLHDL 4 10/03/2015   - last eye exam was in 06/2015 >> no DR. + cataracts B. - She has numbness and tingling in her feet.  She also has a history of  hypertension  ROS: Constitutional: no weight gain/no weight loss, no fatigue, no subjective hyperthermia, no subjective hypothermia Eyes: no blurry vision, no xerophthalmia ENT: no sore throat, no nodules palpated in throat, no dysphagia, no odynophagia, no hoarseness Cardiovascular: no CP/no SOB/no palpitations/no leg swelling Respiratory: no cough/no SOB/no wheezing Gastrointestinal: no N/no V/no D/no C/no acid reflux Musculoskeletal: no muscle aches/no joint aches Skin: no rashes, no hair loss Neurological: no tremors/no numbness/no tingling/no dizziness  I reviewed pt's medications, allergies, PMH, social hx, family hx, and changes were documented in the history of present illness. Otherwise, unchanged from my initial visit note.   Past Medical History:  Diagnosis Date  . Allergic rhinitis, cause unspecified 03/30/2013  . Anxiety state, unspecified   . Arthritis   . Asthma   . Borderline diabetes mellitus    a1c 7.0 (07/26/13)   . Chronic bronchitis   . Depressive disorder, not elsewhere classified   . Diabetes mellitus without complication ([place])   . Difficulty waking    hard to wake up past sedation!  . Migraine, unspecified, without mention of intractable migraine without mention of status migrainosus   . Other and unspecified hyperlipidemia   . Panic attacks   . Postmenopausal symptoms   . Unspecified essential hypertension   . Unspecified urinary incontinence   . UTI (lower  urinary tract infection)    on 02/05/16/on meds   Past Surgical History:  Procedure Laterality Date  . ABDOMINAL HYSTERECTOMY  2002  . DIAGNOSTIC LAPAROSCOPY    . dislocated shoulder     right  . tumor right ankle     Social History   Occupational History  . Self employed-interior design/flower arrangements    Social History Main Topics  . Smoking status: Never Smoker  . Smokeless tobacco: Never Used  . Alcohol use No  . Drug use: No   Social History Narrative   -Retired - does  Company secretary work   -Divorced in 2007   -One son - lives locally. 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 and entertain family. Bowling. Interior decorating   Current Outpatient Prescriptions on File Prior to Visit  Medication Sig Dispense Refill  . albuterol (PROAIR HFA) 108 (90 BASE) MCG/ACT inhaler Inhale 2 puffs into the lungs every 4 (four) hours as needed for wheezing (((PLAN B))).    Marland Kitchen albuterol (PROVENTIL) (2.5 MG/3ML) 0.083% nebulizer solution Inhale 3 mLs (2.5 mg total) into the lungs every 4 (four) hours as needed for wheezing or shortness of breath (((PLAN C))). 75 mL 2  . ALPRAZolam (XANAX) 1 MG tablet Take 1 mg by mouth 2 (two) times daily as needed for anxiety.     . budesonide-formoterol (SYMBICORT) 160-4.5 MCG/ACT inhaler Inhale 2 puffs into the lungs 2 (two) times daily. 1 Inhaler 5  . gabapentin (NEURONTIN) 300 MG capsule Take 1 capsule (300 mg total) by mouth 2 (two) times daily. (Patient not taking: Reported on 11/15/2016) 14 capsule 0  . glipiZIDE (GLIPIZIDE XL) 10 MG 24 hr tablet Take 1 tablet (10 mg total) by mouth daily with breakfast. 90 tablet 3  . HYDROcodone-acetaminophen (NORCO/VICODIN) 5-325 MG tablet Take 1 tablet by mouth every 6 (six) hours as needed for moderate pain. 60 tablet 0  . ibuprofen (ADVIL,MOTRIN) 800 MG tablet TAKE 1 TABLET(800 MG) BY MOUTH EVERY 8 HOURS AS NEEDED 60 tablet 0  . irbesartan (AVAPRO) 300 MG tablet Take 1 tablet (300 mg total) by mouth daily. 90 tablet 3  . lidocaine (LIDODERM) 5 % Place 1 patch onto the skin daily. Remove & Discard patch within 12 hours or as directed by MD 60 patch 1  . montelukast (SINGULAIR) 10 MG tablet Take 1 tablet (10 mg total) by mouth at bedtime. 30 tablet 5  . Multiple Vitamin (MULTIVITAMIN) tablet Take 1 tablet by mouth daily. Woman's MVI-TAke one daily    . Respiratory Therapy Supplies (FLUTTER) DEVI Use as directed 1 each 0   No current facility-administered  medications on file prior to visit.    Allergies  Allergen Reactions  . Compazine Other (See Comments)    Stroke like symptoms  . Haloperidol Decanoate Other (See Comments)    Extrapyramidal syndrome  . Penicillins Nausea And Vomiting  . Metformin And Related Other (See Comments)    GI upset  . Codeine Itching  . Lisinopril Cough   Family History  Problem Relation Age of Onset  . Arthritis Unknown        family history  . Hypertension Unknown        grandparent  . Colon cancer Maternal Grandfather   . Asthma Son   . Rheum arthritis Mother   . Hypertension Mother   . Diabetes Maternal Uncle   . Diabetes Paternal Uncle   . Diabetes Maternal Grandmother  PE: BP 132/90 (BP Location: Left Arm, Patient Position: Sitting)   Pulse (!) 101   Wt 213 lb (96.6 kg)   SpO2 98%   BMI 33.36 kg/m  Wt Readings from Last 3 Encounters:  11/22/16 213 lb (96.6 kg)  11/15/16 214 lb (97.1 kg)  11/06/16 213 lb (96.6 kg)   Constitutional: obese, in NAD Eyes: PERRLA, EOMI, no exophthalmos ENT: moist mucous membranes, no thyromegaly, no cervical lymphadenopathy Cardiovascular: RRR, No MRG Respiratory: CTA B Gastrointestinal: abdomen soft, NT, ND, BS+ Musculoskeletal: no deformities, strength intact in all 4 Skin: moist, warm, no rashes Neurological: no tremor with outstretched hands, DTR normal in all 4  ASSESSMENT: 1. DM2, non-insulin-dependent, uncontrolled, without long term complications, but with hyperglycemia  2. Peripheral neuropathy  PLAN:  1. Patient with long-standing DM, now very uncontrolled >> drinking juice. We discussed that she is glucotoxic >> I suggested to start insulin. She reluctantly agrees. I do not feel this will be permanent if she changes her diet. We discussed about improving diet.  - demonstrated insulin pen use and discussed correct inj techniques - will also move glipizide to before b'fast - I suggested to:  Patient Instructions  Please continue: -  Glipizide 10 mg, but move this to 15-30 min before b'fast  Please start: - Lantus 12 units at bedtime If the sugars in am are not <130, increase Lantus by 3 units every 3 days  When injecting insulin:  Inject in the abdomen  Rotate the injection sites around the belly button  Change needle for each injection  Keep needle in for 10 sec after last unit of insulin in  Keep the insulin in use out of the fridge  Please let me know if the sugars are consistently <80 or >200.  Come back after 02/04/2017.  - continue checking sugars at different times of the day - check 2x a day, rotating checks - advised for yearly eye exams >> she is UTD - Return to clinic in 3 mo with sugar log   2. PN - worse - will reevaluate when DM is more controlled  Philemon Kingdom, MD PhD [facility] Endocrinology

## 2016-11-22 NOTE — Patient Instructions (Addendum)
Please continue: - Glipizide 10 mg, but move this to 15-30 min before b'fast  Please start: - Lantus 12 units at bedtime If the sugars in am are not <130, increase Lantus by 3 units every 3 days  When injecting insulin:  Inject in the abdomen  Rotate the injection sites around the belly button  Change needle for each injection  Keep needle in for 10 sec after last unit of insulin in  Keep the insulin in use out of the fridge  Please let me know if the sugars are consistently <80 or >200.  Come back after 02/04/2017.   Please consider the following ways to cut down carbs and fat and increase fiber and micronutrients in your diet: - substitute whole grain for white bread or pasta - substitute brown rice for white rice - substitute 90-calorie flat bread pieces for slices of bread when possible - substitute sweet potatoes or yams for white potatoes - substitute humus for margarine - substitute tofu for cheese when possible - substitute almond or rice milk for regular milk (would not drink soy milk daily due to concern for soy estrogen influence on breast cancer risk) - substitute dark chocolate for other sweets when possible - substitute water - can add lemon or orange slices for taste - for diet sodas (artificial sweeteners will trick your body that you can eat sweets without getting calories and will lead you to overeating and weight gain in the long run) - do not skip breakfast or other meals (this will slow down the metabolism and will result in more weight gain over time)  - can try smoothies made from fruit and almond/rice milk in am instead of regular breakfast - can also try old-fashioned (not instant) oatmeal made with almond/rice milk in am - order the dressing on the side when eating salad at a restaurant (pour less than half of the dressing on the salad) - eat as little meat as possible - can try juicing, but should not forget that juicing will get rid of the fiber, so  would alternate with eating raw veg./fruits or drinking smoothies - use as little oil as possible, even when using olive oil - can dress a salad with a mix of balsamic vinegar and lemon juice, for e.g. - use agave nectar, stevia sugar, or regular sugar rather than artificial sweateners - steam or broil/roast veggies  - snack on veggies/fruit/nuts (unsalted, preferably) when possible, rather than processed foods - reduce or eliminate aspartame in diet (it is in diet sodas, chewing gum, etc) Read the labels!  Try to read Gloria [name] book: "Program for Reversing Diabetes" for other ideas for healthy eating.

## 2016-11-27 ENCOUNTER — Telehealth: Payer: Self-pay | Admitting: Internal Medicine

## 2016-11-27 ENCOUNTER — Telehealth: Payer: Self-pay

## 2016-11-27 MED ORDER — GABAPENTIN 100 MG PO CAPS: 100.0000 mg | ORAL_CAPSULE | Freq: Two times a day (BID) | ORAL | 3 refills | Status: DC

## 2016-11-27 NOTE — Telephone Encounter (Signed)
We can start Neurontin 100 mg 2x a day. If the burning bothers her mostly at night, she can take both tablets at bedtime. Please let us know if the burning does not improve in a week.

## 2016-11-27 NOTE — Telephone Encounter (Signed)
Patient is requesting advice. States that she is experiencing extreme burning in both of her feet.this has been since her 05/25 visit.   Verified cell #

## 2016-11-27 NOTE — Telephone Encounter (Signed)
Called and advised of note, submitted the RX to pharmacy and advised patient to call back if burning persist.

## 2016-11-27 NOTE — Telephone Encounter (Signed)
Please advise. Thank you

## 2016-12-03 ENCOUNTER — Telehealth: Payer: Self-pay | Admitting: Internal Medicine

## 2016-12-03 ENCOUNTER — Telehealth: Payer: Self-pay

## 2016-12-03 NOTE — Telephone Encounter (Signed)
Called patient and submitted questions to MD/

## 2016-12-03 NOTE — Telephone Encounter (Signed)
Patient called to advise that she has been weak for the past week and blood sugar is at 140-160. Please call patient to advise. Okay to leave a detailed message on patient's phone. Patient thinks the medicine is too strong for her. Patient would like a call today as soon as possible.

## 2016-12-03 NOTE — Telephone Encounter (Signed)
She is not actually getting low, per the readings that she gave [place], however, as a precaution, we can change her glipizide XL to 5 mg to take before breakfast. Can you please take off her medication list the 10 mg and send a new prescription for the 5? She should not cut the 10 mg in half.

## 2016-12-03 NOTE — Telephone Encounter (Signed)
Called patient back, she was not at home now to give me all her readings, But does remember some, It was 141 last night, this after noon it was 150, and the only time it has been 160 was Monday morning around 11:00. Patient is doing her normal insulin and states that after she takes her glipizide she begins to feel weak, patient is wondering if she needs to continue this medication regimen or stop the glipizide.  Please advise, Thank you!

## 2016-12-04 ENCOUNTER — Other Ambulatory Visit: Payer: Self-pay

## 2016-12-04 MED ORDER — GLIPIZIDE ER 5 MG PO TB24: 5.0000 mg | ORAL_TABLET | Freq: Every day | ORAL | 1 refills | Status: DC

## 2016-12-04 NOTE — Telephone Encounter (Signed)
Called and advised patient of note, and submitted new RX.

## 2016-12-04 NOTE — Telephone Encounter (Signed)
Called patient and advised of Dr.Gherghes note, patient understood and had no questions. Submitted new RX dose.

## 2016-12-05 ENCOUNTER — Other Ambulatory Visit: Payer: Self-pay

## 2016-12-05 ENCOUNTER — Telehealth: Payer: Self-pay

## 2016-12-05 ENCOUNTER — Telehealth: Payer: Self-pay | Admitting: Internal Medicine

## 2016-12-05 MED ORDER — METFORMIN HCL ER 500 MG PO TB24: 500.0000 mg | ORAL_TABLET | Freq: Every day | ORAL | 2 refills | Status: DC

## 2016-12-05 NOTE — Telephone Encounter (Signed)
Patient called back to advise how she was feeling now, she states she did not take the glipizide this morning and feels much better, no SOB like before. Patient also states she is taking the insulin since the 25th, no problems with that. She checked her sugar just now and it was 142. Patient is asking if she can go back on her Metformin once a day, as she can not afford to keep spending money on medications.   Please advise. Thank you!

## 2016-12-05 NOTE — Telephone Encounter (Signed)
Called and advised patient of note, patient understood, no questions at this time.

## 2016-12-05 NOTE — Telephone Encounter (Signed)
OK, stop Glip. Start metformin ER 500 mg with dinner >> can you please send this?

## 2016-12-05 NOTE — Telephone Encounter (Signed)
Also, did she start the insulin?

## 2016-12-05 NOTE — Telephone Encounter (Signed)
Called patient advised of note below, advised patient to call back to discuss starting Januvia, and to ask if patient had started her insulin. Left call back number.

## 2016-12-05 NOTE — Telephone Encounter (Signed)
See below.  Thank you

## 2016-12-05 NOTE — Telephone Encounter (Signed)
That would be very unusual... We can stop Glipizide though and see if SOB improves. If SOB not improved >> contact PCP. We can try Januvia 100 mg before b'fast.

## 2016-12-05 NOTE — Telephone Encounter (Signed)
Please advise. Thank you

## 2016-12-05 NOTE — Telephone Encounter (Signed)
Patient called to advise that the glipiZIDE (GLIPIZIDE XL) 5 MG 24 hr tablet [[id number]] is giving her SOB, and she is currently experiencing this. She states that she is struggling to walk short distances. I messaged [employer], and was advised to submit a phone note to dr Cruzita Lederer for review. No triage completed. Verified cell #

## 2016-12-18 DIAGNOSIS — G4733 Obstructive sleep apnea (adult) (pediatric): Secondary | ICD-10-CM

## 2016-12-20 ENCOUNTER — Encounter: Payer: Self-pay | Admitting: Internal Medicine

## 2016-12-20 ENCOUNTER — Ambulatory Visit (INDEPENDENT_AMBULATORY_CARE_PROVIDER_SITE_OTHER): Payer: Medicare Other | Admitting: Internal Medicine

## 2016-12-20 VITALS — BP 126/84 | Ht 66.5 in | Wt 210.0 lb

## 2016-12-20 DIAGNOSIS — I10 Essential (primary) hypertension: Secondary | ICD-10-CM

## 2016-12-20 DIAGNOSIS — E782 Mixed hyperlipidemia: Secondary | ICD-10-CM | POA: Diagnosis not present

## 2016-12-20 DIAGNOSIS — E1165 Type 2 diabetes mellitus with hyperglycemia: Secondary | ICD-10-CM | POA: Diagnosis not present

## 2016-12-20 DIAGNOSIS — IMO0002 Reserved for concepts with insufficient information to code with codable children: Secondary | ICD-10-CM

## 2016-12-20 DIAGNOSIS — E1140 Type 2 diabetes mellitus with diabetic neuropathy, unspecified: Secondary | ICD-10-CM

## 2016-12-20 LAB — POCT GLYCOSYLATED HEMOGLOBIN (HGB A1C): Hemoglobin A1C: 8.5

## 2016-12-20 MED ORDER — ACCU-CHEK AVIVA DEVI: 0 refills | Status: AC

## 2016-12-20 MED ORDER — LANCETS MISC: 11 refills | Status: DC

## 2016-12-20 MED ORDER — GLUCOSE BLOOD VI STRP: ORAL_STRIP | 12 refills | Status: DC

## 2016-12-20 MED ORDER — INSULIN GLARGINE 100 UNIT/ML SOLOSTAR PEN: 16.0000 [IU] | PEN_INJECTOR | Freq: Every day | SUBCUTANEOUS | 11 refills | Status: DC

## 2016-12-20 NOTE — Assessment & Plan Note (Signed)
stable overall by history and exam, recent data reviewed with pt, and pt to continue medical treatment as before,  to f/u any worsening symptoms or concerns BP Readings from Last 3 Encounters:  12/20/16 126/84  11/22/16 132/90  11/15/16 (!) 152/94

## 2016-12-20 NOTE — Assessment & Plan Note (Signed)
stable overall by history and exam, recent data reviewed with pt, and pt to continue medical treatment as before,  to f/u any worsening symptoms or concernes Lab Results  Component Value Date   CHOL 140 10/03/2015   HDL 37.50 (L) 10/03/2015   LDLDIRECT 64.0 10/03/2015   TRIG 256.0 (H) 10/03/2015   CHOLHDL 4 10/03/2015

## 2016-12-20 NOTE — Progress Notes (Signed)
Subjective:    Patient ID: Gloria [name], female    DOB: 1950-07-13, 66 y.o.   MRN: [id number]  HPI Here to f/u after seen per Dr Sharlet Salina then Dr Renne Crigler Jaclyn Prime for symptomatic hyperglycemia. Taken off glipizide ER due to dyspnea., but seems to be tolerating the ER metformin now when did not tolerate regular metformin.  Pt denies polydipsia, polyuria, or low sugar symptoms such as weakness or confusion improved with po intake.  Pt states overall good compliance with meds, trying to follow lower cholesterol, diabetic diet but not sure what this diet is, wt overall stable but little exercise however.   CBG' much improved on current tx of lantus 12 units qhs and metfromin ER 500 qd with values averaging about 130. With range 111 to 210  Insurance would not cover prodigy glucometer - needs more strips for the accucheck aviva Wt Readings from Last 3 Encounters:  12/20/16 210 lb (95.3 kg)  11/22/16 213 lb (96.6 kg)  11/15/16 214 lb (97.1 kg)   Past Medical History:  Diagnosis Date  . Allergic rhinitis, cause unspecified 03/30/2013  . Anxiety state, unspecified   . Arthritis   . Asthma   . Borderline diabetes mellitus    a1c 7.0 (07/26/13)   . Chronic bronchitis   . Depressive disorder, not elsewhere classified   . Diabetes mellitus without complication ([place])   . Difficulty waking    hard to wake up past sedation!  . Migraine, unspecified, without mention of intractable migraine without mention of status migrainosus   . Other and unspecified hyperlipidemia   . Panic attacks   . Postmenopausal symptoms   . Unspecified essential hypertension   . Unspecified urinary incontinence   . UTI (lower urinary tract infection)    on 02/05/16/on meds   Past Surgical History:  Procedure Laterality Date  . ABDOMINAL HYSTERECTOMY  2002  . DIAGNOSTIC LAPAROSCOPY    . dislocated shoulder     right  . tumor right ankle      reports that she has never smoked. She has never used smokeless tobacco. She  reports that she does not drink alcohol or use drugs. family history includes Asthma in her son; Colon cancer in her maternal grandfather; Diabetes in her maternal grandmother, maternal uncle, and paternal uncle; Hypertension in her mother; Rheum arthritis in her mother. Allergies  Allergen Reactions  . Compazine Other (See Comments)    Stroke like symptoms  . Haloperidol Decanoate Other (See Comments)    Extrapyramidal syndrome  . Penicillins Nausea And Vomiting  . Glipizide Other (See Comments)    dyspnea  . Metformin And Related Other (See Comments)    GI upset  . Codeine Itching  . Lisinopril Cough   Current Outpatient Prescriptions on File Prior to Visit  Medication Sig Dispense Refill  . albuterol (PROAIR HFA) 108 (90 BASE) MCG/ACT inhaler Inhale 2 puffs into the lungs every 4 (four) hours as needed for wheezing (((PLAN B))).    Marland Kitchen albuterol (PROVENTIL) (2.5 MG/3ML) 0.083% nebulizer solution Inhale 3 mLs (2.5 mg total) into the lungs every 4 (four) hours as needed for wheezing or shortness of breath (((PLAN C))). 75 mL 2  . ALPRAZolam (XANAX) 1 MG tablet Take 1 mg by mouth 2 (two) times daily as needed for anxiety.     . budesonide-formoterol (SYMBICORT) 160-4.5 MCG/ACT inhaler Inhale 2 puffs into the lungs 2 (two) times daily. 1 Inhaler 5  . gabapentin (NEURONTIN) 100 MG capsule Take 1 capsule (100 mg  total) by mouth 2 (two) times daily. 90 capsule 3  . HYDROcodone-acetaminophen (NORCO/VICODIN) 5-325 MG tablet Take 1 tablet by mouth every 6 (six) hours as needed for moderate pain. 60 tablet 0  . ibuprofen (ADVIL,MOTRIN) 800 MG tablet TAKE 1 TABLET(800 MG) BY MOUTH EVERY 8 HOURS AS NEEDED 60 tablet 0  . Insulin Pen Needle (CAREFINE PEN NEEDLES) 32G X 4 MM MISC Use 1x a day 100 each 2  . irbesartan (AVAPRO) 300 MG tablet Take 1 tablet (300 mg total) by mouth daily. 90 tablet 3  . lidocaine (LIDODERM) 5 % Place 1 patch onto the skin daily. Remove & Discard patch within 12 hours or as  directed by MD 60 patch 1  . metFORMIN (GLUCOPHAGE-XR) 500 MG 24 hr tablet Take 1 tablet (500 mg total) by mouth daily with breakfast. 30 tablet 2  . montelukast (SINGULAIR) 10 MG tablet Take 1 tablet (10 mg total) by mouth at bedtime. 30 tablet 5  . Multiple Vitamin (MULTIVITAMIN) tablet Take 1 tablet by mouth daily. Woman's MVI-TAke one daily    . Respiratory Therapy Supplies (FLUTTER) DEVI Use as directed 1 each 0   No current facility-administered medications on file prior to visit.     Review of Systems  Constitutional: Negative for other unusual diaphoresis or sweats HENT: Negative for ear discharge or swelling Eyes: Negative for other worsening visual disturbances Respiratory: Negative for stridor or other swelling  Gastrointestinal: Negative for worsening distension or other blood Genitourinary: Negative for retention or other urinary change Musculoskeletal: Negative for other MSK pain or swelling Skin: Negative for color change or other new lesions Neurological: Negative for worsening tremors and other numbness  Psychiatric/Behavioral: Negative for worsening agitation or other fatigue All other sytem neg per pt    Objective:   Physical Exam BP 126/84   Ht 5' 6.5" (1.689 m)   Wt 210 lb (95.3 kg)   BMI 33.39 kg/m  VS noted,  Constitutional: Pt appears in NAD HENT: Head: NCAT.  Right Ear: External ear normal.  Left Ear: External ear normal.  Eyes: . Pupils are equal, round, and reactive to light. Conjunctivae and EOM are normal Nose: without d/c or deformity Neck: Neck supple. Gross normal ROM Cardiovascular: Normal rate and regular rhythm.   Pulmonary/Chest: Effort normal and breath sounds without rales or wheezing.  Neurological: Pt is alert. At baseline orientation, motor grossly intact Skin: Skin is warm. No rashes, other new lesions, no LE edema Psychiatric: Pt behavior is normal without agitation  No other exam findings Lab Results  Component Value Date    HGBA1C 11.4 (H) 11/04/2016      Assessment & Plan:

## 2016-12-20 NOTE — Assessment & Plan Note (Signed)
With recent severe uncontrolled, now improved, ok to increase the lantus to 16 units per day, cont metformin ER 500 qd, for glucometer supplies, refer DM education, f/u endo as planned; even though we are adjusting the lantus, pt is eager for f/u A1c today to make sure it is heading in the right direction

## 2016-12-20 NOTE — Patient Instructions (Addendum)
Ok to increase the lantus to 16 units per day  You will be contacted regarding the referral for: Diabetic education  Please continue all other medications as before, and refills have been done if requested.  Please have the pharmacy call with any other refills you may need.  Please continue your efforts at being more active, low cholesterol diet, and weight control.  Please keep your appointments with your specialists as you may have planned - Encocrinology next month  Please return in 6 months, or sooner if needed

## 2016-12-25 ENCOUNTER — Other Ambulatory Visit: Payer: Self-pay | Admitting: *Deleted

## 2016-12-25 ENCOUNTER — Encounter: Payer: Self-pay | Admitting: Pulmonary Disease

## 2016-12-25 ENCOUNTER — Other Ambulatory Visit: Payer: Self-pay

## 2016-12-25 ENCOUNTER — Telehealth: Payer: Self-pay | Admitting: Pulmonary Disease

## 2016-12-25 DIAGNOSIS — R0683 Snoring: Secondary | ICD-10-CM

## 2016-12-25 DIAGNOSIS — G4733 Obstructive sleep apnea (adult) (pediatric): Secondary | ICD-10-CM

## 2016-12-25 DIAGNOSIS — R29818 Other symptoms and signs involving the nervous system: Secondary | ICD-10-CM

## 2016-12-25 HISTORY — DX: Obstructive sleep apnea (adult) (pediatric): G47.33

## 2016-12-25 NOTE — Telephone Encounter (Signed)
Advised pt of sleep study and pt agreed to start CPAP. Order placed for CPAP and download requested in month after CPAP start.

## 2016-12-25 NOTE — Telephone Encounter (Signed)
HST 12/18/16 >> AHI 10.1, SaO2 low 85%   Will have my nurse inform pt that sleep study shows mild sleep apnea.  Options are 1) CPAP now, 2) ROV first.  If She is agreeable to CPAP, then please send order for auto CPAP range 5 to 15 cm H2O with heated humidity and mask of choice.  Have download sent 1 month after starting CPAP and set up ROV 2 months after starting CPAP.  ROV can be with me or NP.

## 2017-01-10 DIAGNOSIS — G4733 Obstructive sleep apnea (adult) (pediatric): Secondary | ICD-10-CM | POA: Diagnosis not present

## 2017-01-14 DIAGNOSIS — G4733 Obstructive sleep apnea (adult) (pediatric): Secondary | ICD-10-CM | POA: Diagnosis not present

## 2017-01-20 ENCOUNTER — Other Ambulatory Visit: Payer: Self-pay | Admitting: Internal Medicine

## 2017-02-04 ENCOUNTER — Telehealth: Payer: Self-pay

## 2017-02-04 ENCOUNTER — Telehealth: Payer: Self-pay | Admitting: Internal Medicine

## 2017-02-04 NOTE — Telephone Encounter (Signed)
Patient would like a call back RE bs being 102.  Ty, -LL

## 2017-02-04 NOTE — Telephone Encounter (Signed)
Called patient and advised questions. She wanted to ask if this was a good reading. I advised that it was. Patient had no other questions.

## 2017-02-10 DIAGNOSIS — G4733 Obstructive sleep apnea (adult) (pediatric): Secondary | ICD-10-CM | POA: Diagnosis not present

## 2017-02-27 ENCOUNTER — Encounter: Payer: Self-pay | Admitting: Internal Medicine

## 2017-02-27 ENCOUNTER — Ambulatory Visit (INDEPENDENT_AMBULATORY_CARE_PROVIDER_SITE_OTHER): Payer: Medicare Other | Admitting: Internal Medicine

## 2017-02-27 VITALS — BP 132/84 | HR 94 | Ht 66.5 in | Wt 209.0 lb

## 2017-02-27 DIAGNOSIS — E7849 Other hyperlipidemia: Secondary | ICD-10-CM

## 2017-02-27 DIAGNOSIS — IMO0002 Reserved for concepts with insufficient information to code with codable children: Secondary | ICD-10-CM

## 2017-02-27 DIAGNOSIS — E1165 Type 2 diabetes mellitus with hyperglycemia: Secondary | ICD-10-CM | POA: Diagnosis not present

## 2017-02-27 DIAGNOSIS — E1140 Type 2 diabetes mellitus with diabetic neuropathy, unspecified: Secondary | ICD-10-CM

## 2017-02-27 DIAGNOSIS — E784 Other hyperlipidemia: Secondary | ICD-10-CM | POA: Diagnosis not present

## 2017-02-27 LAB — LIPID PANEL
Cholesterol: 91 mg/dL (ref 0–200)
HDL: 26.6 mg/dL — ABNORMAL LOW (ref >39.00)
LDL Cholesterol: 45 mg/dL (ref 0–99)
NonHDL: 64.05
Total CHOL/HDL Ratio: 3
Triglycerides: 93 mg/dL (ref 0.0–149.0)
VLDL: 18.6 mg/dL (ref 0.0–40.0)

## 2017-02-27 MED ORDER — METFORMIN HCL ER 500 MG PO TB24: 500.0000 mg | ORAL_TABLET | Freq: Two times a day (BID) | ORAL | 3 refills | Status: DC

## 2017-02-27 NOTE — Patient Instructions (Addendum)
Please continue: - Lantus 16 units at bedtime  Please increase: - Metformin ER to 500 mg 2x a day with meals  Please come back for a follow-up appointment in 3 months.

## 2017-02-27 NOTE — Progress Notes (Signed)
Patient ID: Gloria [name], female   DOB: January 25, 1951, 66 y.o.   MRN: [id number]   HPI: Gloria [name] is a 66 y.o.-year-old female, returning for DM2, dx in 2015, non-insulin-dependent, uncontrolled, with complications (peripheral neuropathy). Last visit 3 mo ago.  Last hemoglobin A1c was: Lab Results  Component Value Date   HGBA1C 8.5 12/20/2016   HGBA1C 11.4 (H) 11/04/2016   HGBA1C 6.6 04/01/2016  She has asthma  - at change of season  - 2x a year >> Prednisone. She also had knee steroid inj's.   Pt is on: - Metformin ER 500 mg daily in am - started 10/2016 - Lantus 16 units at bedtime - started 10/2016 We had to stop Glipizide b/c weakness She was on Metformin >> diarrhea. Now tolerates Metformin ER better  Pt checks sugars 1x a day: - am: n/c >> 160-200 >> 87, 110-130, 200 (cantaloupe) - 2h after b'fast: n/c - before lunch: n/c - 2h after lunch: n/c - before dinner: n/c - 2h after dinner: n/c >> 189 - bedtime: n/c - nighttime: n/c No lows; ? hypoglycemia awareness. Highest sugar was 200 >> 200s.  Glucometer: Prodigy (talking meter)  Pt's meals are: - Breakfast: oatmeal + berries or banana, coffee + creamer, OJ - Lunch: salad, hamburger - Dinner: fish, chicken - Snacks: 1 She stopped fresh fries, sweets. She drinks diet sodas. No exercise.  - No CKD, last BUN/creatinine:  Lab Results  Component Value Date   BUN 6 11/04/2016   BUN 9 06/14/2016   CREATININE 0.71 11/04/2016   CREATININE 0.69 06/14/2016   - she has HL; last set of lipids: Lab Results  Component Value Date   CHOL 140 10/03/2015   HDL 37.50 (L) 10/03/2015   LDLDIRECT 64.0 10/03/2015   TRIG 256.0 (H) 10/03/2015   CHOLHDL 4 10/03/2015   - last eye exam was in 06/2015 >> No DR. + cataracts B. - she has numbness and tingling in her feet >> much better! On Neurontin.  She also has a history of HTN.  ROS: Constitutional: no weight gain/no weight loss, + fatigue, no subjective hyperthermia, no subjective  hypothermia, + nocturia Eyes: no blurry vision, no xerophthalmia ENT: no sore throat, no nodules palpated in throat, no dysphagia, no odynophagia, no hoarseness Cardiovascular: + CP/+ SOB/+ palpitations/no leg swelling Respiratory: no cough/+ SOB/no wheezing Gastrointestinal: no N/no V/+ D/no C/no acid reflux Musculoskeletal: no muscle aches/+ joint aches Skin: no rashes, no hair loss Neurological: no tremors/+ numbness/+ tingling/no dizziness  I reviewed pt's medications, allergies, PMH, social hx, family hx, and changes were documented in the history of present illness. Otherwise, unchanged from my initial visit note.  Past Medical History:  Diagnosis Date  . Allergic rhinitis, cause unspecified 03/30/2013  . Anxiety state, unspecified   . Arthritis   . Asthma   . Borderline diabetes mellitus    a1c 7.0 (07/26/13)   . Chronic bronchitis   . Depressive disorder, not elsewhere classified   . Diabetes mellitus without complication ([place])   . Difficulty waking    hard to wake up past sedation!  . Migraine, unspecified, without mention of intractable migraine without mention of status migrainosus   . OSA (obstructive sleep apnea) 12/25/2016  . Other and unspecified hyperlipidemia   . Panic attacks   . Postmenopausal symptoms   . Unspecified essential hypertension   . Unspecified urinary incontinence   . UTI (lower urinary tract infection)    on 02/05/16/on meds   Past Surgical History:  Procedure  Laterality Date  . ABDOMINAL HYSTERECTOMY  2002  . DIAGNOSTIC LAPAROSCOPY    . dislocated shoulder     right  . tumor right ankle     Social History   Occupational History  . Self employed-interior design/flower arrangements    Social History Main Topics  . Smoking status: Never Smoker  . Smokeless tobacco: Never Used  . Alcohol use No  . Drug use: No   Social History Narrative   -Retired - does Company secretary work   -Divorced in 2007   -One son - lives locally. 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 and entertain family. Bowling. Interior decorating   Current Outpatient Prescriptions on File Prior to Visit  Medication Sig Dispense Refill  . albuterol (PROAIR HFA) 108 (90 BASE) MCG/ACT inhaler Inhale 2 puffs into the lungs every 4 (four) hours as needed for wheezing (((PLAN B))).    Marland Kitchen albuterol (PROVENTIL) (2.5 MG/3ML) 0.083% nebulizer solution Inhale 3 mLs (2.5 mg total) into the lungs every 4 (four) hours as needed for wheezing or shortness of breath (((PLAN C))). 75 mL 2  . ALPRAZolam (XANAX) 1 MG tablet Take 1 mg by mouth 2 (two) times daily as needed for anxiety.     . Blood Glucose Monitoring Suppl (ACCU-CHEK AVIVA) device Use as instructed 1 each 0  . budesonide-formoterol (SYMBICORT) 160-4.5 MCG/ACT inhaler Inhale 2 puffs into the lungs 2 (two) times daily. 1 Inhaler 5  . gabapentin (NEURONTIN) 100 MG capsule Take 1 capsule (100 mg total) by mouth 2 (two) times daily. 90 capsule 3  . glucose blood (ACCU-CHEK AVIVA) test strip Use as instructed four times per day 400 each 12  . HYDROcodone-acetaminophen (NORCO/VICODIN) 5-325 MG tablet Take 1 tablet by mouth every 6 (six) hours as needed for moderate pain. 60 tablet 0  . ibuprofen (ADVIL,MOTRIN) 800 MG tablet TAKE 1 TABLET(800 MG) BY MOUTH EVERY 8 HOURS AS NEEDED 60 tablet 0  . Insulin Glargine (LANTUS SOLOSTAR) 100 UNIT/ML Solostar Pen Inject 16 Units into the skin daily at 10 pm. 5 pen 11  . Insulin Pen Needle (CAREFINE PEN NEEDLES) 32G X 4 MM MISC Use 1x a day 100 each 2  . irbesartan (AVAPRO) 150 MG tablet TAKE 1 TABLET(150 MG) BY MOUTH DAILY 90 tablet 2  . irbesartan (AVAPRO) 300 MG tablet Take 1 tablet (300 mg total) by mouth daily. 90 tablet 3  . Lancets MISC Use as directed 4 times per day 400 each 11  . lidocaine (LIDODERM) 5 % Place 1 patch onto the skin daily. Remove & Discard patch within 12 hours or as directed by MD 60 patch 1  . metFORMIN (GLUCOPHAGE-XR)  500 MG 24 hr tablet Take 1 tablet (500 mg total) by mouth daily with breakfast. 30 tablet 2  . montelukast (SINGULAIR) 10 MG tablet Take 1 tablet (10 mg total) by mouth at bedtime. 30 tablet 5  . Multiple Vitamin (MULTIVITAMIN) tablet Take 1 tablet by mouth daily. Woman's MVI-TAke one daily    . Respiratory Therapy Supplies (FLUTTER) DEVI Use as directed 1 each 0   No current facility-administered medications on file prior to visit.    Allergies  Allergen Reactions  . Compazine Other (See Comments)    Stroke like symptoms  . Haloperidol Decanoate Other (See Comments)    Extrapyramidal syndrome  . Penicillins Nausea And Vomiting  . Glipizide Other (See Comments)    dyspnea  .  Metformin And Related Other (See Comments)    GI upset  . Codeine Itching  . Lisinopril Cough   Family History  Problem Relation Age of Onset  . Arthritis Unknown        family history  . Hypertension Unknown        grandparent  . Colon cancer Maternal Grandfather   . Asthma Son   . Rheum arthritis Mother   . Hypertension Mother   . Diabetes Maternal Uncle   . Diabetes Paternal Uncle   . Diabetes Maternal Grandmother    PE: BP 132/84   Pulse 94   Ht 5' 6.5" (1.689 m)   Wt 209 lb (94.8 kg)   SpO2 97%   BMI 33.23 kg/m  Wt Readings from Last 3 Encounters:  02/27/17 209 lb (94.8 kg)  12/20/16 210 lb (95.3 kg)  11/22/16 213 lb (96.6 kg)   Constitutional: overweight, in NAD Eyes: PERRLA, EOMI, no exophthalmos ENT: moist mucous membranes, no thyromegaly, no cervical lymphadenopathy Cardiovascular: tachycardia, RR, No MRG Respiratory: CTA B Gastrointestinal: abdomen soft, NT, ND, BS+ Musculoskeletal: no deformities, strength intact in all 4 Skin: moist, warm, no rashes Neurological: no tremor with outstretched hands, DTR normal in all 4  ASSESSMENT: 1. DM2, non-insulin-dependent, uncontrolled, without long term complications, but with hyperglycemia  2. Peripheral neuropathy  3. HL  PLAN:   1. Patient with long-standing DM, very uncontrolled at last visit >> now with much better sugars after we started basl insulin. We changed from Glipizide to Metformin ER low dose b/c weakness >> she tolerates the Metformin ER well. Will try to increase the dose as sugars are still slightly above target. - we may be able to decrease insulin at last visit if she continues to improve her sugars - reviewed latest HbA1c from 2 mo ago >> already improved, 8.5% - I suggested to:  Patient Instructions  Please continue: - Lantus 16 units at bedtime  Please increase: - Metformin ER to 500 mg 2x a day with meals  Please come back for a follow-up appointment in 3 months.  - continue checking sugars at different times of the day - check 1-2x a day, rotating checks - advised for yearly eye exams >> she needs one - Return to clinic in 3 mo with sugar log    2. PN - improved with improving DM control - Continue Neurontin  3. HL - due for recheck >> will check today - h/o elevated TG  Component     Latest Ref Rng & Units 02/27/2017  Cholesterol     0 - 200 mg/dL 91  Triglycerides     0.0 - 149.0 mg/dL 93.0  HDL Cholesterol     >39.00 mg/dL 26.60 (L)  VLDL     0.0 - 40.0 mg/dL 18.6  LDL (calc)     0 - 99 mg/dL 45  Total CHOL/HDL Ratio      3  NonHDL      64.05    Philemon Kingdom, MD PhD [facility] Endocrinology

## 2017-02-28 ENCOUNTER — Telehealth: Payer: Self-pay | Admitting: Internal Medicine

## 2017-02-28 NOTE — Telephone Encounter (Signed)
Patient called stating she forgot to ask Dr. Cruzita Lederer about the new meter (pt does not know the name) where you do not have to stick your finger and it goes on the arm. Patient would like this meter. Call patient to get more information .

## 2017-03-04 NOTE — Telephone Encounter (Signed)
It is not approved by [facility] for pts that are not on multiple doses (3 or more) of insulin per day, unfortunately.

## 2017-03-04 NOTE — Telephone Encounter (Signed)
Please advise 

## 2017-03-06 ENCOUNTER — Other Ambulatory Visit: Payer: Self-pay | Admitting: Internal Medicine

## 2017-03-06 NOTE — Telephone Encounter (Signed)
Patient notified and voiced understanding. Patient advised on the phone she is still having difficulty breathing while taking the metformin. She wanted to advise you should would be contacting her pulmonologist about this but wanted to know your thoughts?

## 2017-03-06 NOTE — Telephone Encounter (Signed)
We may need to stop the Metformin for 1 week and see how her breathing is doing if she is convince they are related. In the meantime, may need a higher insulin dose, e.g. 20 units or higher.

## 2017-03-06 NOTE — Telephone Encounter (Signed)
Patient notified and voiced understanding.

## 2017-03-06 NOTE — Telephone Encounter (Signed)
Yes, this is fine.

## 2017-03-06 NOTE — Telephone Encounter (Signed)
I contacted the patient and she stated she stopped the metformin yesterday and wanted to confirm if she could start the 20 units daily of insulin today?

## 2017-03-13 ENCOUNTER — Other Ambulatory Visit: Payer: Self-pay

## 2017-03-13 DIAGNOSIS — G4733 Obstructive sleep apnea (adult) (pediatric): Secondary | ICD-10-CM | POA: Diagnosis not present

## 2017-03-13 MED ORDER — INSULIN GLARGINE 100 UNIT/ML SOLOSTAR PEN: 20.0000 [IU] | PEN_INJECTOR | Freq: Every day | SUBCUTANEOUS | 1 refills | Status: DC

## 2017-03-19 ENCOUNTER — Ambulatory Visit (INDEPENDENT_AMBULATORY_CARE_PROVIDER_SITE_OTHER): Payer: Medicare Other | Admitting: Internal Medicine

## 2017-03-19 ENCOUNTER — Encounter: Payer: Self-pay | Admitting: Internal Medicine

## 2017-03-19 VITALS — BP 150/90 | HR 98 | Temp 98.2°F | Ht 66.5 in | Wt 214.0 lb

## 2017-03-19 DIAGNOSIS — I5020 Unspecified systolic (congestive) heart failure: Secondary | ICD-10-CM

## 2017-03-19 DIAGNOSIS — E1140 Type 2 diabetes mellitus with diabetic neuropathy, unspecified: Secondary | ICD-10-CM

## 2017-03-19 DIAGNOSIS — I10 Essential (primary) hypertension: Secondary | ICD-10-CM | POA: Diagnosis not present

## 2017-03-19 DIAGNOSIS — E1165 Type 2 diabetes mellitus with hyperglycemia: Secondary | ICD-10-CM

## 2017-03-19 DIAGNOSIS — IMO0002 Reserved for concepts with insufficient information to code with codable children: Secondary | ICD-10-CM

## 2017-03-19 MED ORDER — FUROSEMIDE 20 MG PO TABS: 20.0000 mg | ORAL_TABLET | Freq: Every day | ORAL | 3 refills | Status: DC

## 2017-03-19 NOTE — Assessment & Plan Note (Signed)
Mild elevated today but did not take meds and has volume overload, to restart home meds and consider add BB such as low dose coreg

## 2017-03-19 NOTE — Assessment & Plan Note (Signed)
New onset worsening symptoms, will add lasix 20 qd prn (but start by taking daily for at least 10 days), dialy wts, and refer to Dr Oval Linsey per pt request

## 2017-03-19 NOTE — Assessment & Plan Note (Signed)
Overall improved, will need podiatry regular f/u as well  - will refer

## 2017-03-19 NOTE — Patient Instructions (Addendum)
Please take all new medication as prescribed - the lasix 20 mg every day for the first 10 days, then take after that only if have swelling in the legs  Please continue all other medications as before, and refills have been done if requested.  Please have the pharmacy call with any other refills you may need.  Please continue your efforts at being more active, low cholesterol diet, and weight control.  You will be contacted regarding the referral for: podiatry, and cardiology   Please keep your appointments with your specialists as you may have planned  Please return in 1 months, or sooner if needed, with Lab testing done 3-5 days before

## 2017-03-19 NOTE — Progress Notes (Signed)
Subjective:    Patient ID: Gloria [name], female    DOB: 20-Aug-1950, 66 y.o.   MRN: [id number]  HPI Here to f/u; overall doing ok,  Pt denies chest pain, wheezing, orthopnea, PND, palpitations, dizziness or syncope, but has had significant increased sob/doe/LE edema in the last 2 wks, worse to lie down, assoc with wt gain as well.  She has been trying to increase her fluids trying to really work on the DM diet and stay hydrated.  Has no prior hx of edema.  Did have echo with slightly low EF 50-55% 2017, also stress test at about the same time with EF 45%. Is on ACE, ASA but not on BB.   Pt denies new neurological symptoms such as new headache, or facial or extremity weakness   Pt denies polydipsia, polyuria, or low sugar episode. Most recent a1c about 8 per pt, much improved from over 11 earlier this yr, followed per endo.   Pt states overall good compliance with meds, mostly trying to follow appropriate diet, with wt overall stable,  but little exercise however. Has dx neuropathy, asks for podiatry referral.   Unfortunately,did not take meds this am due to not eating.   BP Readings from Last 3 Encounters:  03/19/17 (!) 150/90  02/27/17 132/84  12/20/16 126/84   Past Medical History:  Diagnosis Date  . Allergic rhinitis, cause unspecified 03/30/2013  . Anxiety state, unspecified   . Arthritis   . Asthma   . Borderline diabetes mellitus    a1c 7.0 (07/26/13)   . Chronic bronchitis   . Depressive disorder, not elsewhere classified   . Diabetes mellitus without complication ([place])   . Difficulty waking    hard to wake up past sedation!  . Migraine, unspecified, without mention of intractable migraine without mention of status migrainosus   . OSA (obstructive sleep apnea) 12/25/2016  . Other and unspecified hyperlipidemia   . Panic attacks   . Postmenopausal symptoms   . Unspecified essential hypertension   . Unspecified urinary incontinence   . UTI (lower urinary tract infection)    on  02/05/16/on meds   Past Surgical History:  Procedure Laterality Date  . ABDOMINAL HYSTERECTOMY  2002  . DIAGNOSTIC LAPAROSCOPY    . dislocated shoulder     right  . tumor right ankle      reports that she has never smoked. She has never used smokeless tobacco. She reports that she does not drink alcohol or use drugs. family history includes Arthritis in her unknown relative; Asthma in her son; Colon cancer in her maternal grandfather; Diabetes in her maternal grandmother, maternal uncle, and paternal uncle; Hypertension in her mother and unknown relative; Rheum arthritis in her mother. Allergies  Allergen Reactions  . Compazine Other (See Comments)    Stroke like symptoms  . Haloperidol Decanoate Other (See Comments)    Extrapyramidal syndrome  . Penicillins Nausea And Vomiting  . Glipizide Other (See Comments)    dyspnea  . Metformin And Related Other (See Comments)    GI upset  . Codeine Itching  . Lisinopril Cough   Current Outpatient Prescriptions on File Prior to Visit  Medication Sig Dispense Refill  . ALPRAZolam (XANAX) 1 MG tablet Take 1 mg by mouth 2 (two) times daily as needed for anxiety.     . Blood Glucose Monitoring Suppl (ACCU-CHEK AVIVA) device Use as instructed 1 each 0  . budesonide-formoterol (SYMBICORT) 160-4.5 MCG/ACT inhaler Inhale 2 puffs into the lungs 2 (two)  times daily. 1 Inhaler 5  . gabapentin (NEURONTIN) 100 MG capsule Take 1 capsule (100 mg total) by mouth 2 (two) times daily. 90 capsule 3  . glucose blood (ACCU-CHEK AVIVA) test strip Use as instructed four times per day 400 each 12  . ibuprofen (ADVIL,MOTRIN) 800 MG tablet TAKE 1 TABLET(800 MG) BY MOUTH EVERY 8 HOURS AS NEEDED 60 tablet 0  . Insulin Glargine (LANTUS SOLOSTAR) 100 UNIT/ML Solostar Pen Inject 20 Units into the skin daily at 10 pm. 10 pen 1  . irbesartan (AVAPRO) 300 MG tablet Take 1 tablet (300 mg total) by mouth daily. 90 tablet 3  . metFORMIN (GLUCOPHAGE-XR) 500 MG 24 hr tablet Take  1 tablet (500 mg total) by mouth 2 (two) times daily with a meal. 180 tablet 3   No current facility-administered medications on file prior to visit.    Review of Systems  Constitutional: Negative for other unusual diaphoresis or sweats HENT: Negative for ear discharge or swelling Eyes: Negative for other worsening visual disturbances Respiratory: Negative for stridor or other swelling  Gastrointestinal: Negative for worsening distension or other blood Genitourinary: Negative for retention or other urinary change Musculoskeletal: Negative for other MSK pain or swelling Skin: Negative for color change or other new lesions Neurological: Negative for worsening tremors and other numbness  Psychiatric/Behavioral: Negative for worsening agitation or other fatigue All other system neg per pt    Objective:   Physical Exam BP (!) 150/90   Pulse 98   Temp 98.2 F (36.8 C) (Oral)   Ht 5' 6.5" (1.689 m)   Wt 214 lb (97.1 kg)   SpO2 100%   BMI 34.02 kg/m  VS noted,  Constitutional: Pt appears in NAD HENT: Head: NCAT.  Right Ear: External ear normal.  Left Ear: External ear normal.  Eyes: . Pupils are equal, round, and reactive to light. Conjunctivae and EOM are normal Nose: without d/c or deformity Neck: Neck supple. Gross normal ROM Cardiovascular: Normal rate and regular rhythm.   Pulmonary/Chest: Effort normal and breath sounds decreased without rales or wheezing.  Abd:  Soft, NT, ND, + BS, no organomegaly Neurological: Pt is alert. At baseline orientation, motor grossly intact Skin: Skin is warm. No rashes, other new lesions, with right > left 1-2+ LE edema to knees Psychiatric: Pt behavior is normal without agitation  No other exam findings Lab Results  Component Value Date   WBC 11.0 (H) 06/14/2016   HGB 11.8 06/14/2016   HCT 36.9 06/14/2016   PLT 335 06/14/2016   GLUCOSE 457 (H) 11/04/2016   CHOL 91 02/27/2017   TRIG 93.0 02/27/2017   HDL 26.60 (L) 02/27/2017    LDLDIRECT 64.0 10/03/2015   LDLCALC 45 02/27/2017   ALT 23 11/04/2016   AST 15 11/04/2016   NA 131 (L) 11/04/2016   K 3.3 (L) 11/04/2016   CL 96 11/04/2016   CREATININE 0.71 11/04/2016   BUN 6 11/04/2016   CO2 28 11/04/2016   TSH 3.26 03/27/2016   HGBA1C 8.5 12/20/2016   MICROALBUR <0.7 10/03/2015       Assessment & Plan:

## 2017-03-21 ENCOUNTER — Other Ambulatory Visit: Payer: Self-pay | Admitting: Cardiovascular Disease

## 2017-03-21 ENCOUNTER — Encounter: Payer: Self-pay | Admitting: Cardiovascular Disease

## 2017-03-21 ENCOUNTER — Ambulatory Visit (INDEPENDENT_AMBULATORY_CARE_PROVIDER_SITE_OTHER): Payer: Medicare Other | Admitting: Cardiovascular Disease

## 2017-03-21 VITALS — BP 160/100 | HR 100 | Ht 67.0 in | Wt 209.0 lb

## 2017-03-21 DIAGNOSIS — R0789 Other chest pain: Secondary | ICD-10-CM

## 2017-03-21 DIAGNOSIS — I5041 Acute combined systolic (congestive) and diastolic (congestive) heart failure: Secondary | ICD-10-CM | POA: Diagnosis not present

## 2017-03-21 DIAGNOSIS — R0602 Shortness of breath: Secondary | ICD-10-CM

## 2017-03-21 DIAGNOSIS — I10 Essential (primary) hypertension: Secondary | ICD-10-CM

## 2017-03-21 MED ORDER — CARVEDILOL 6.25 MG PO TABS: 6.2500 mg | ORAL_TABLET | Freq: Two times a day (BID) | ORAL | 5 refills | Status: DC

## 2017-03-21 MED ORDER — FUROSEMIDE 40 MG PO TABS: ORAL_TABLET | ORAL | 1 refills | Status: DC

## 2017-03-21 NOTE — Patient Instructions (Addendum)
Medication Instructions:  START CARVEDILOL 6.25 MG TWICE A DAY   INCREASE YOUR FUROSEMIDE TO 40 MG TWICE A DAY FOR 3 DAYS. AFTER 3 DAYS GO TO 40 MG DAILY   Labwork: BMET NEXT WHEN YOU RETURN NEXT WEEK   Testing/Procedures: NONE  Follow-Up: Your physician recommends that you schedule a follow-up appointment in: [place]  Your physician recommends that you schedule a follow-up appointment in: NEXT AVAILABLE WITH DR [facility]   If you need a refill on your cardiac medications before your next appointment, please call your pharmacy.

## 2017-03-21 NOTE — Progress Notes (Signed)
Cardiology Office Note   Date:  03/21/2017   ID:  Gloria [name], Gloria [name] Mar 23, 1951, MRN [id number]  PCP:  Biagio Borg, MD  Cardiologist:   Skeet Latch, MD   Chief Complaint  [name] presents with  . Follow-up      History of Present Illness: Gloria [name] is a 66 y.o. female with chronic systolic and diastolic heart failure, hypertension, hyperlipidemia, diabetes, and morbid obesity who presents for evaluation of an abnormal echo.  Gloria [name] had an echo 03/21/16 that revealed LVEF 50% with grade 2 diastolic dysfunction. There was also mild mitral regurgitation. It was recommended that she have a limited echo with Definity contrast to better evaluate for regional wall motion.  She was referred for a Lexiscan Myoview 05/17/16 that showed LVEF 45% with breast attenuation artifact but no ischemia.  For Gloria last several months she has noted significant dyspnea on exertion.  She reports 3-4 pillow orthopnea and lower extremity edema.  She also has chest pressure when lying down or with movement.  She gets drained just walking to her mailbox.  She sometimes gets short of breath just talking.  She reported these symptoms to her PCP, Dr. Jenny Reichmann, who started her on lasix 20mg  daily.  She notes mild improvement but hasn't been urinating as much as she would expect.  Since her last appointment Gloria [name] was also diagnosed with diabetes.  Her hemaglobin A1c was 11%.  This has improved to 8.5% 11/2016.   Past Medical History:  Diagnosis Date  . Allergic rhinitis, cause unspecified 03/30/2013  . Anxiety state, unspecified   . Arthritis   . Asthma   . Borderline diabetes mellitus    a1c 7.0 (07/26/13)   . Chronic bronchitis   . Depressive disorder, not elsewhere classified   . Diabetes mellitus without complication ([place])   . Difficulty waking    hard to wake up past sedation!  . Migraine, unspecified, without mention of intractable migraine without mention of status migrainosus   . OSA (obstructive  sleep apnea) 12/25/2016  . Other and unspecified hyperlipidemia   . Panic attacks   . Postmenopausal symptoms   . Unspecified essential hypertension   . Unspecified urinary incontinence   . UTI (lower urinary tract infection)    on 02/05/16/on meds    Past Surgical History:  Procedure Laterality Date  . ABDOMINAL HYSTERECTOMY  2002  . DIAGNOSTIC LAPAROSCOPY    . dislocated shoulder     right  . tumor right ankle       Current Outpatient Prescriptions  Medication Sig Dispense Refill  . ALPRAZolam (XANAX) 1 MG tablet Take 1 mg by mouth 2 (two) times daily as needed for anxiety.     . Blood Glucose Monitoring Suppl (ACCU-CHEK AVIVA) device Use as instructed 1 each 0  . budesonide-formoterol (SYMBICORT) 160-4.5 MCG/ACT inhaler Inhale 2 puffs into Gloria lungs 2 (two) times daily. 1 Inhaler 5  . furosemide (LASIX) 40 MG tablet TAKE 1 TABLET BY MOUTH TWICE A DAY FOR 3 DAYS AND THEN 1 TABLET DAILY 35 tablet 1  . gabapentin (NEURONTIN) 100 MG capsule Take 1 capsule (100 mg total) by mouth 2 (two) times daily. 90 capsule 3  . glucose blood (ACCU-CHEK AVIVA) test strip Use as instructed four times per day 400 each 12  . ibuprofen (ADVIL,MOTRIN) 800 MG tablet TAKE 1 TABLET(800 MG) BY MOUTH EVERY 8 HOURS AS NEEDED 60 tablet 0  . Insulin Glargine (LANTUS SOLOSTAR) 100 UNIT/ML Solostar Pen Inject 20  Units into Gloria skin daily at 10 pm. 10 pen 1  . irbesartan (AVAPRO) 300 MG tablet Take 1 tablet (300 mg total) by mouth daily. 90 tablet 3  . metFORMIN (GLUCOPHAGE-XR) 500 MG 24 hr tablet Take 1 tablet (500 mg total) by mouth 2 (two) times daily with a meal. 180 tablet 3  . carvedilol (COREG) 6.25 MG tablet Take 1 tablet (6.25 mg total) by mouth 2 (two) times daily. 60 tablet 5   No current facility-administered medications for this visit.     Allergies:   Compazine; Haloperidol decanoate; Penicillins; Glipizide; Metformin and related; Codeine; and Lisinopril    Social History:  Gloria [name]  reports  that she has never smoked. She has never used smokeless tobacco. She reports that she does not drink alcohol or use drugs.   Family History:  Gloria [name]'s family history includes Arthritis in her unknown relative; Asthma in her son; Colon cancer in her maternal grandfather; Diabetes in her maternal grandmother, maternal uncle, and paternal uncle; Hypertension in her mother and unknown relative; Rheum arthritis in her mother.    ROS:  Please see Gloria history of present illness.   Otherwise, review of systems are positive for insomnia.   All other systems are reviewed and negative.    PHYSICAL EXAM: VS:  BP (!) 160/100   Pulse 100   Ht 5\' 7"  (1.702 m)   Wt 94.8 kg (209 lb)   BMI 32.73 kg/m  , BMI Body mass index is 32.73 kg/m. GENERAL:  Well appearing.  No acute distress HEENT: Pupils equal round and reactive, fundi not visualized, oral mucosa unremarkable NECK:  JVP 2cm above clavicle at 45 degrees, waveform within normal limits, carotid upstroke brisk and symmetric, no bruits, no thyromegaly LUNGS:  Clear to auscultation bilaterally HEART:  RRR.  PMI not displaced or sustained,S1 and S2 within normal limits, no S3, no S4, no clicks, no rubs, no murmurs ABD:  +abdominal distension.  positive bowel sounds normal in frequency in pitch, no bruits, no rebound, no guarding, no midline pulsatile mass, no hepatomegaly, no splenomegaly EXT:  2 plus pulses throughout, 1+ pitting edema to Gloria mid tibia bilaterally, no cyanosis no clubbing SKIN:  No rashes no nodules NEURO:  Cranial nerves II through XII grossly intact, motor grossly intact throughout PSYCH:  Cognitively intact, oriented to person place and time    EKG:  EKG is ordered today. Gloria ekg ordered today demonstrates sinus rhythm rate 89 bpm.  Non-specific ST-T changes. 03/21/17: Sinus tachycardia.  Rate 106 bpm.  LVH.  Lexiscan Myoview 05/17/16:   Gloria left ventricular ejection fraction is mildly decreased (45-54%).  Nuclear stress  EF: 45%.  Defect 1: There is a medium defect of mild severity present in Gloria mid anterior, mid anteroseptal and apical anterior location. This appears most consistent with shifting breast artifact.  This is a low risk study.   Echo 03/21/16: Study Conclusions  - Left ventricle: Global longitudinal strain is -14.3% Gloria cavity   size was mildly dilated. Wall thickness was normal. Systolic   function was normal. Gloria estimated ejection fraction was in Gloria   range of 50%. Features are consistent with a pseudonormal left   ventricular filling pattern, with concomitant abnormal relaxation   and increased filling pressure (grade 2 diastolic dysfunction). - Mitral valve: There was mild regurgitation. - Left atrium: Gloria atrium was mildly dilated.  Impressions:  - Would recomm limited echo with Definity use to further evaluate   regional wall motion.  Recent Labs: 03/27/2016: TSH 3.26 06/14/2016: Hemoglobin 11.8; Platelets 335 11/04/2016: ALT 23; BUN 6; Creatinine, Ser 0.71; Potassium 3.3; Sodium 131    Lipid Panel    Component Value Date/Time   CHOL 91 02/27/2017 1334   TRIG 93.0 02/27/2017 1334   TRIG 558 01/25/2010   HDL 26.60 (L) 02/27/2017 1334   CHOLHDL 3 02/27/2017 1334   VLDL 18.6 02/27/2017 1334   LDLCALC 45 02/27/2017 1334   LDLDIRECT 64.0 10/03/2015 1634      Wt Readings from Last 3 Encounters:  03/21/17 94.8 kg (209 lb)  03/19/17 97.1 kg (214 lb)  02/27/17 94.8 kg (209 lb)      ASSESSMENT AND PLAN:  # Acute on chronic systolic and diastolic heart failure:  # Shortness of breath: # Atypical chest pain: # Hypertension: Gloria [name] is volume overloaded and her BP is above goal.  Increase lasix to 40mg  bid x3 days then 40mg  daily.  Start carvedilol 6.25mg  bid.  Continue irbesartan 300 mg daily.  If she continues to have shortness of breath once euvolemic we will repeat her ischemia evaluation.  Check BMP in 1 week.   # Diabetes: Given her heart failure, she may  be a good candidate for an SGLT2 inhibitor such as Januvia.   Current medicines are reviewed at length with Gloria [name] today.  Gloria [name] does not have concerns regarding medicines.  Gloria following changes have been made:  no change  Labs/ tests ordered today include:   Orders Placed This Encounter  Procedures  . EKG 12-Lead     Disposition:   FU with Aily Tzeng C. Oval Linsey, MD, [facility] in  2 months.  APP in 1 week.   This note was written with Gloria assistance of speech recognition software.  Please excuse any transcriptional errors.  Signed, Drago Hammonds C. Oval Linsey, MD, [facility]  03/21/2017 6:38 PM    [place]

## 2017-03-24 NOTE — Telephone Encounter (Signed)
Refill Request.  

## 2017-03-26 ENCOUNTER — Ambulatory Visit (INDEPENDENT_AMBULATORY_CARE_PROVIDER_SITE_OTHER): Payer: Medicare Other | Admitting: Physician Assistant

## 2017-03-26 ENCOUNTER — Encounter: Payer: Self-pay | Admitting: Physician Assistant

## 2017-03-26 VITALS — BP 138/82 | HR 82 | Ht 67.0 in | Wt 207.2 lb

## 2017-03-26 DIAGNOSIS — E119 Type 2 diabetes mellitus without complications: Secondary | ICD-10-CM | POA: Diagnosis not present

## 2017-03-26 DIAGNOSIS — E785 Hyperlipidemia, unspecified: Secondary | ICD-10-CM | POA: Diagnosis not present

## 2017-03-26 DIAGNOSIS — I5020 Unspecified systolic (congestive) heart failure: Secondary | ICD-10-CM | POA: Diagnosis not present

## 2017-03-26 DIAGNOSIS — I10 Essential (primary) hypertension: Secondary | ICD-10-CM

## 2017-03-26 DIAGNOSIS — I5042 Chronic combined systolic (congestive) and diastolic (congestive) heart failure: Secondary | ICD-10-CM

## 2017-03-26 DIAGNOSIS — Z794 Long term (current) use of insulin: Secondary | ICD-10-CM | POA: Diagnosis not present

## 2017-03-26 LAB — BASIC METABOLIC PANEL
BUN/Creatinine Ratio: 24 (ref 12–28)
BUN: 17 mg/dL (ref 8–27)
CO2: 26 mmol/L (ref 20–29)
Calcium: 9 mg/dL (ref 8.7–10.3)
Chloride: 95 mmol/L — ABNORMAL LOW (ref 96–106)
Creatinine, Ser: 0.71 mg/dL (ref 0.57–1.00)
GFR calc Af Amer: 103 mL/min/{1.73_m2} (ref >59)
GFR calc non Af Amer: 89 mL/min/{1.73_m2} (ref >59)
Glucose: 128 mg/dL — ABNORMAL HIGH (ref 65–99)
Potassium: 3.2 mmol/L — ABNORMAL LOW (ref 3.5–5.2)
Sodium: 138 mmol/L (ref 134–144)

## 2017-03-26 MED ORDER — CARVEDILOL 12.5 MG PO TABS: 12.5000 mg | ORAL_TABLET | Freq: Two times a day (BID) | ORAL | 6 refills | Status: DC

## 2017-03-26 NOTE — Patient Instructions (Addendum)
Heart Failure Instruction: 1. Avoid salt 2. Limit daily fluid intake to between 32 - 64 oz of fluid 3. Weigh yourself every morning, call cardiology if weight increase by more than 3 lbs overnight or 5 lbs in a single week.    Medication Instructions: Increase Carvedilol to 12.5 mg twice daily.   Labwork: Your physician recommends that you return for lab work today: BMET   Follow-Up: Please keep your scheduled follow-up appointment with Dr. Oval Linsey on 05/08/17. Details listed below for your reference.  Any Other Special Instructions are listed below:  Please call us if your swelling or weight start to go up.   If you need a refill on your cardiac medications before your next appointment, please call your pharmacy.

## 2017-03-26 NOTE — Progress Notes (Signed)
Cardiology Office Note    Date:  03/28/2017   ID:  Gloria, [name] 05-14-1951, MRN [id number]  PCP:  Biagio Borg, MD  Cardiologist:  Dr. Oval Linsey   Chief Complaint  Patient presents with  . Follow-up    seen for Dr. Oval Linsey    History of Present Illness:  Gloria [name] is a 66 y.o. female with PMH of chronic combined systolic and diastolic heart failure, HTN, HLD, DM II and morbid obesity. She had an echocardiogram on 03/21/2016 that revealed LVEF 50% with grade 2 DD. There was also mild mitral regurgitation. It was recommended for her to have limited echocardiogram with Definity contrast to better evaluate regional wall motion. She was referred for Lexiscan on 05/17/2016 that showed EF 45% with breast attenuation artifact but no ischemia. Recently she was seen on 03/21/2017 with orthopnea and lower extremity edema. Her PCP started her on 20 mg Lasix daily. She was seen by Dr. Oval Linsey on 03/21/2017, she was started on Lasix 40 mg twice a day for 3 days before decreasing to 40 mg daily. She was also started on carvedilol 6.25 mg twice a day.  She presents today for cardiology office visit. Her blood pressure is 138/82, I will increase carvedilol to 12.5 mg twice a day. Since starting on the higher dose of diuretic, she has felt much better. She no longer feels bloated and has been breathing better as well. On physical exam, her lung is clear, there is no elevated JVD. She does however still have trace amount of pitting edema in bilateral lower extremity. She is going down to 40 mg daily of Lasix today. I will obtain a basic metabolic panel. Lower extremity edema is not very significant. I asked her to contact us if lower extremity edema does come back with the lower dose of Lasix. Otherwise she can follow-up with Dr. Oval Linsey in November.    Past Medical History:  Diagnosis Date  . Allergic rhinitis, cause unspecified 03/30/2013  . Anxiety state, unspecified   . Arthritis   . Asthma   .  Borderline diabetes mellitus    a1c 7.0 (07/26/13)   . Chronic bronchitis   . Depressive disorder, not elsewhere classified   . Diabetes mellitus without complication ([place])   . Difficulty waking    hard to wake up past sedation!  . Migraine, unspecified, without mention of intractable migraine without mention of status migrainosus   . OSA (obstructive sleep apnea) 12/25/2016  . Other and unspecified hyperlipidemia   . Panic attacks   . Postmenopausal symptoms   . Unspecified essential hypertension   . Unspecified urinary incontinence   . UTI (lower urinary tract infection)    on 02/05/16/on meds    Past Surgical History:  Procedure Laterality Date  . ABDOMINAL HYSTERECTOMY  2002  . DIAGNOSTIC LAPAROSCOPY    . dislocated shoulder     right  . tumor right ankle      Current Medications: Outpatient Medications Prior to Visit  Medication Sig Dispense Refill  . ALPRAZolam (XANAX) 1 MG tablet Take 1 mg by mouth 2 (two) times daily as needed for anxiety.     . Blood Glucose Monitoring Suppl (ACCU-CHEK AVIVA) device Use as instructed 1 each 0  . budesonide-formoterol (SYMBICORT) 160-4.5 MCG/ACT inhaler Inhale 2 puffs into the lungs 2 (two) times daily. 1 Inhaler 5  . gabapentin (NEURONTIN) 100 MG capsule Take 1 capsule (100 mg total) by mouth 2 (two) times daily. 90 capsule 3  .  glucose blood (ACCU-CHEK AVIVA) test strip Use as instructed four times per day 400 each 12  . ibuprofen (ADVIL,MOTRIN) 800 MG tablet TAKE 1 TABLET(800 MG) BY MOUTH EVERY 8 HOURS AS NEEDED 60 tablet 0  . Insulin Glargine (LANTUS SOLOSTAR) 100 UNIT/ML Solostar Pen Inject 20 Units into the skin daily at 10 pm. 10 pen 1  . irbesartan (AVAPRO) 300 MG tablet Take 1 tablet (300 mg total) by mouth daily. 90 tablet 3  . metFORMIN (GLUCOPHAGE-XR) 500 MG 24 hr tablet Take 1 tablet (500 mg total) by mouth 2 (two) times daily with a meal. 180 tablet 3  . carvedilol (COREG) 6.25 MG tablet Take 1 tablet (6.25 mg total) by  mouth 2 (two) times daily. 60 tablet 5  . furosemide (LASIX) 40 MG tablet Take 0.5 tablets (20 mg total) by mouth daily. 90 tablet 1   No facility-administered medications prior to visit.      Allergies:   Compazine; Haloperidol decanoate; Penicillins; Glipizide; Metformin and related; Codeine; and Lisinopril   Social History   Social History  . Marital status: Divorced    Spouse name: Gloria [name]  . Number of children: 1  . Years of education: Gloria [name]   Occupational History  . RETIRED ---Self employed-interior design/flower arrangements    Social History Main Topics  . Smoking status: Never Smoker  . Smokeless tobacco: Never Used  . Alcohol use No  . Drug use: No  . Sexual activity: Not Currently   Other Topics Concern  . None   Social History Narrative   -Retired - does Company secretary   -Divorced in 2007   -One son - lives locally. 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 and entertain family. Bowling. Interior decorating           Family History:  The patient's family history includes Arthritis in her unknown relative; Asthma in her son; Colon cancer in her maternal grandfather; Diabetes in her maternal grandmother, maternal uncle, and paternal uncle; Hypertension in her mother and unknown relative; Rheum arthritis in her mother.   ROS:   Please see the history of present illness.    ROS All other systems reviewed and are negative.   PHYSICAL EXAM:   VS:  BP 138/82   Pulse 82   Ht 5\' 7"  (1.702 m)   Wt 207 lb 3.2 oz (94 kg)   BMI 32.45 kg/m    GEN: Well nourished, well developed, in no acute distress  HEENT: normal  Neck: no JVD, carotid bruits, or masses Cardiac: RRR; no murmurs, rubs, or gallops,no edema  Respiratory:  clear to auscultation bilaterally, normal work of breathing GI: soft, nontender, nondistended, + BS MS: no deformity or atrophy  Skin: warm and dry, no rash Neuro:  Alert and Oriented x 3, Strength and sensation  are intact Psych: euthymic mood, full affect  Wt Readings from Last 3 Encounters:  03/26/17 207 lb 3.2 oz (94 kg)  03/21/17 209 lb (94.8 kg)  03/19/17 214 lb (97.1 kg)      Studies/Labs Reviewed:   EKG:  EKG is not ordered today.    Recent Labs: 06/14/2016: Hemoglobin 11.8; Platelets 335 11/04/2016: ALT 23 03/26/2017: BUN 17; Creatinine, Ser 0.71; Potassium 3.2; Sodium 138   Lipid Panel    Component Value Date/Time   CHOL 91 02/27/2017 1334   TRIG 93.0 02/27/2017 1334   TRIG 558 01/25/2010   HDL 26.60 (L) 02/27/2017 1334  CHOLHDL 3 02/27/2017 1334   VLDL 18.6 02/27/2017 1334   LDLCALC 45 02/27/2017 1334   LDLDIRECT 64.0 10/03/2015 1634    Additional studies/ records that were reviewed today include:   Echo 03/21/2016 LV EF: 50% -   55%  ------------------------------------------------------------------- Indications:      CHF (I50.31).  ------------------------------------------------------------------- History:   PMH:  Edema, Asthma.  Congestive heart failure.  Risk factors:  Hypertension. Diabetes mellitus. Dyslipidemia.  ------------------------------------------------------------------- Study Conclusions  - Left ventricle: Global longitudinal strain is -14.3% The cavity   size was mildly dilated. Wall thickness was normal. Systolic   function was normal. The estimated ejection fraction was in the   range of 50%. Features are consistent with a pseudonormal left   ventricular filling pattern, with concomitant abnormal relaxation   and increased filling pressure (grade 2 diastolic dysfunction). - Mitral valve: There was mild regurgitation. - Left atrium: The atrium was mildly dilated.  Impressions:  - Would recomm limited echo with Definity use to further evaluate   regional wall motion.    Myoview 05/17/2016 Study Highlights    The left ventricular ejection fraction is mildly decreased (45-54%).  Nuclear stress EF: 45%.  Defect 1: There is a  medium defect of mild severity present in the mid anterior, mid anteroseptal and apical anterior location. This appears most consistent with shifting breast artifact.  This is a low risk study.    ASSESSMENT:    1. Chronic combined systolic and diastolic CHF (congestive heart failure) ([place])   2. Essential hypertension   3. Hyperlipidemia, unspecified hyperlipidemia type   4. Controlled type 2 diabetes mellitus without complication, with long-term current use of insulin (HCC)      PLAN:  In order of problems listed above:  1. Chronic combined systolic and diastolic heart failure: Going down to 40 mg daily of Lasix. Will obtain basic metabolic panel today. She only has trace amount of edema in the lower extremity. Will continue on the current medication.  2. Hypertension: Blood pressure well controlled  3. Hyperlipidemia: Most recent lab work reviewed, her HDL is low, however total cholesterol, triglyceride and LDL well controlled  4. DM 2: On insulin managed by primary care provider    Medication Adjustments/Labs and Tests Ordered: Current medicines are reviewed at length with the patient today.  Concerns regarding medicines are outlined above.  Medication changes, Labs and Tests ordered today are listed in the Patient Instructions below. Patient Instructions  Heart Failure Instruction: 1. Avoid salt 2. Limit daily fluid intake to between 32 - 64 oz of fluid 3. Weigh yourself every morning, call cardiology if weight increase by more than 3 lbs overnight or 5 lbs in a single week.    Medication Instructions: Increase Carvedilol to 12.5 mg twice daily.   Labwork: Your physician recommends that you return for lab work today: BMET   Follow-Up: Please keep your scheduled follow-up appointment with Dr. Oval Linsey on 05/08/17. Details listed below for your reference.  Any Other Special Instructions are listed below:  Please call us if your swelling or weight start to go  up.   If you need a refill on your cardiac medications before your next appointment, please call your pharmacy.     Hilbert Corrigan, [place]  03/28/2017 7:53 AM    [address], [place], [place]  [address] Phone: [phone]; Fax: [phone]

## 2017-03-27 ENCOUNTER — Encounter: Payer: Self-pay | Admitting: *Deleted

## 2017-03-27 ENCOUNTER — Telehealth: Payer: Self-pay | Admitting: *Deleted

## 2017-03-27 MED ORDER — POTASSIUM CHLORIDE ER 10 MEQ PO TBCR: 10.0000 meq | EXTENDED_RELEASE_TABLET | Freq: Every day | ORAL | 5 refills | Status: DC

## 2017-03-27 NOTE — Telephone Encounter (Signed)
-----   Message from [place], [place] sent at 03/27/2017  4:36 PM EDT ----- Potassium persistently low since 4 month ago, recommend 10 meq KCl daily. Aware she is also on irbesartan which can raise potassium

## 2017-03-27 NOTE — Telephone Encounter (Signed)
Results and recommendations discussed with patient, who verbalized understanding and thanks. rx sent to her preferred pharmacy.

## 2017-03-27 NOTE — Progress Notes (Signed)
Potassium persistently low since 4 month ago, recommend 10 meq KCl daily. Aware she is also on irbesartan which can raise potassium

## 2017-03-28 ENCOUNTER — Encounter: Payer: Self-pay | Admitting: Physician Assistant

## 2017-04-02 ENCOUNTER — Ambulatory Visit (INDEPENDENT_AMBULATORY_CARE_PROVIDER_SITE_OTHER): Payer: Medicare Other | Admitting: Internal Medicine

## 2017-04-02 ENCOUNTER — Ambulatory Visit (INDEPENDENT_AMBULATORY_CARE_PROVIDER_SITE_OTHER): Payer: Medicare Other | Admitting: Podiatry

## 2017-04-02 VITALS — BP 154/98 | HR 96 | Ht 67.0 in | Wt 200.0 lb

## 2017-04-02 VITALS — BP 148/88 | Temp 97.9°F | Ht 67.0 in | Wt 209.0 lb

## 2017-04-02 DIAGNOSIS — B351 Tinea unguium: Secondary | ICD-10-CM

## 2017-04-02 DIAGNOSIS — M25561 Pain in right knee: Secondary | ICD-10-CM

## 2017-04-02 DIAGNOSIS — E1140 Type 2 diabetes mellitus with diabetic neuropathy, unspecified: Secondary | ICD-10-CM | POA: Diagnosis not present

## 2017-04-02 DIAGNOSIS — IMO0002 Reserved for concepts with insufficient information to code with codable children: Secondary | ICD-10-CM

## 2017-04-02 DIAGNOSIS — E1142 Type 2 diabetes mellitus with diabetic polyneuropathy: Secondary | ICD-10-CM

## 2017-04-02 DIAGNOSIS — E1165 Type 2 diabetes mellitus with hyperglycemia: Secondary | ICD-10-CM | POA: Diagnosis not present

## 2017-04-02 DIAGNOSIS — I10 Essential (primary) hypertension: Secondary | ICD-10-CM

## 2017-04-02 MED ORDER — HYDROCODONE-ACETAMINOPHEN 7.5-325 MG PO TABS: 1.0000 | ORAL_TABLET | Freq: Four times a day (QID) | ORAL | 0 refills | Status: DC | PRN

## 2017-04-02 MED ORDER — KETOCONAZOLE 2 % EX CREA
TOPICAL_CREAM | CUTANEOUS | 0 refills | Status: DC
Start: 2017-04-02 — End: 2017-11-18

## 2017-04-02 NOTE — Assessment & Plan Note (Signed)
Elevated today likely situational, o/w stable overall by history and exam, recent data reviewed with pt, and pt to continue medical treatment as before,  to f/u any worsening symptoms or concerns BP Readings from Last 3 Encounters:  04/02/17 (!) 148/88  04/02/17 (!) 154/98  03/26/17 138/82

## 2017-04-02 NOTE — Assessment & Plan Note (Signed)
stable overall by history and exam, recent data reviewed with pt, and pt to continue medical treatment as before,  to f/u any worsening symptoms or concerns  

## 2017-04-02 NOTE — Progress Notes (Signed)
Subjective:    Patient ID: Gloria [name], female    DOB: [birth year], 66 y.o.   MRN: [id number]  HPI  Here with 1 wk severe right knee pain with some swelling over the past wk, has been seeing sport med but limited success, now almost tearful, pain 10/10 today   But no fever, trauma, giveaways or falls.  Pt denies chest pain, increased sob or doe, wheezing, orthopnea, PND, increased LE swelling, palpitations, dizziness or syncope.  Pt denies new neurological symptoms such as new headache, or facial or extremity weakness or numbness  .  Has had  Hydrocodone for flares in the past.  Is specifically asking for a narcotic shot today/declines toradol, and also asking the hydrocodone as before when she was getting fairly regular refills by me, prior to my discontinuing chronic pain management from my /. Pt denies chest pain, increased sob or doe, wheezing, orthopnea, PND, increased LE swelling, palpitations, dizziness or syncope.   Pt denies polydipsia, polyuria Past Medical History:  Diagnosis Date  . Allergic rhinitis, cause unspecified 03/30/2013  . Anxiety state, unspecified   . Arthritis   . Asthma   . Borderline diabetes mellitus    a1c 7.0 (07/26/13)   . Chronic bronchitis   . Depressive disorder, not elsewhere classified   . Diabetes mellitus without complication ([place])   . Difficulty waking    hard to wake up past sedation!  . Migraine, unspecified, without mention of intractable migraine without mention of status migrainosus   . OSA (obstructive sleep apnea) 12/25/2016  . Other and unspecified hyperlipidemia   . Panic attacks   . Postmenopausal symptoms   . Unspecified essential hypertension   . Unspecified urinary incontinence   . UTI (lower urinary tract infection)    on 02/05/16/on meds   Past Surgical History:  Procedure Laterality Date  . ABDOMINAL HYSTERECTOMY  2002  . DIAGNOSTIC LAPAROSCOPY    . dislocated shoulder     right  . tumor right ankle      reports that she has  never smoked. She has never used smokeless tobacco. She reports that she does not drink alcohol or use drugs. family history includes Arthritis in her unknown relative; Asthma in her son; Colon cancer in her maternal grandfather; Diabetes in her maternal grandmother, maternal uncle, and paternal uncle; Hypertension in her mother and unknown relative; Rheum arthritis in her mother. Allergies  Allergen Reactions  . Compazine Other (See Comments)    Stroke like symptoms  . Haloperidol Decanoate Other (See Comments)    Extrapyramidal syndrome  . Penicillins Nausea And Vomiting  . Glipizide Other (See Comments)    dyspnea  . Metformin And Related Other (See Comments)    GI upset  . Codeine Itching  . Lisinopril Cough   Current Outpatient Prescriptions on File Prior to Visit  Medication Sig Dispense Refill  . ALPRAZolam (XANAX) 1 MG tablet Take 1 mg by mouth 2 (two) times daily as needed for anxiety.     . Ascorbic Acid (VITAMIN C DROPS MT) Use as directed 1 Dose in the mouth or throat daily.    . Blood Glucose Monitoring Suppl (ACCU-CHEK AVIVA) device Use as instructed 1 each 0  . budesonide-formoterol (SYMBICORT) 160-4.5 MCG/ACT inhaler Inhale 2 puffs into the lungs 2 (two) times daily. 1 Inhaler 5  . carvedilol (COREG) 12.5 MG tablet Take 1 tablet (12.5 mg total) by mouth 2 (two) times daily. 60 tablet 6  . Cholecalciferol (VITAMIN D3) 1000000 UNIT/GM LIQD  by Does not apply route.    . Cholecalciferol (VITAMIN D3) 5000 units CAPS Take 10,000 Units by mouth daily.    . furosemide (LASIX) 40 MG tablet Take 40 mg by mouth daily.    Marland Kitchen gabapentin (NEURONTIN) 100 MG capsule Take 1 capsule (100 mg total) by mouth 2 (two) times daily. 90 capsule 3  . glucose blood (ACCU-CHEK AVIVA) test strip Use as instructed four times per day 400 each 12  . ibuprofen (ADVIL,MOTRIN) 800 MG tablet TAKE 1 TABLET(800 MG) BY MOUTH EVERY 8 HOURS AS NEEDED 60 tablet 0  . Insulin Glargine (LANTUS SOLOSTAR) 100 UNIT/ML  Solostar Pen Inject 20 Units into the skin daily at 10 pm. 10 pen 1  . irbesartan (AVAPRO) 300 MG tablet Take 1 tablet (300 mg total) by mouth daily. 90 tablet 3  . ketoconazole (NIZORAL) 2 % cream Apply 1 fingertip amount to each foot daily. 30 g 0  . metFORMIN (GLUCOPHAGE-XR) 500 MG 24 hr tablet Take 1 tablet (500 mg total) by mouth 2 (two) times daily with a meal. 180 tablet 3  . NON FORMULARY [facility]  Peripheral Neuropathy Cream- Bupivacaine 1%, Doxepin 3%, Gabapentin 6%, Pentoxifylline 3%, Topiramate 1% Apply 1-2 grams to affected area 3-4 times daily Qty. 120 gm 3 refills    . potassium chloride (K-DUR) 10 MEQ tablet Take 1 tablet (10 mEq total) by mouth daily. 30 tablet 5   No current facility-administered medications on file prior to visit.    Review of Systems  Constitutional: Negative for other unusual diaphoresis or sweats HENT: Negative for ear discharge or swelling Eyes: Negative for other worsening visual disturbances Respiratory: Negative for stridor or other swelling  Gastrointestinal: Negative for worsening distension or other blood Genitourinary: Negative for retention or other urinary change Musculoskeletal: Negative for other MSK pain or swelling Skin: Negative for color change or other new lesions Neurological: Negative for worsening tremors and other numbness  Psychiatric/Behavioral: Negative for worsening agitation or other fatigue All other system neg per pt    Objective:   Physical Exam BP (!) 148/88   Temp 97.9 F (36.6 C) (Oral)   Ht 5\' 7"  (1.702 m)   Wt 209 lb (94.8 kg)   BMI 32.73 kg/m  VS noted,  Constitutional: Pt appears in NAD HENT: Head: NCAT.  Right Ear: External ear normal.  Left Ear: External ear normal.  Eyes: . Pupils are equal, round, and reactive to light. Conjunctivae and EOM are normal Nose: without d/c or deformity Neck: Neck supple. Gross normal ROM Cardiovascular: Normal rate and regular rhythm.   Pulmonary/Chest:  Effort normal and breath sounds without rales or wheezing.  Right knee with 2+ effusion, severe pain to ambulate and reduced ROM Neurological: Pt is alert. At baseline orientation, motor grossly intact Skin: Skin is warm. No rashes, other new lesions, no LE edema Psychiatric: Pt behavior is normal without agitation  No other exam findings       Assessment & Plan:

## 2017-04-02 NOTE — Progress Notes (Signed)
Subjective:  Patient ID: Gloria [name], female    DOB: [birth year],  MRN: [id number] HPI Chief Complaint  Patient presents with  . Diabetes    check/trim nails   66 y.o. female presents with the above complaint. Reports that she is diabetic. Last A1c 8.0. Endorses numbness and tingling in her feet. Denies cramping in legs and thighs.  Past Medical History:  Diagnosis Date  . Allergic rhinitis, cause unspecified 03/30/2013  . Anxiety state, unspecified   . Arthritis   . Asthma   . Borderline diabetes mellitus    a1c 7.0 (07/26/13)   . Chronic bronchitis   . Depressive disorder, not elsewhere classified   . Diabetes mellitus without complication ([place])   . Difficulty waking    hard to wake up past sedation!  . Migraine, unspecified, without mention of intractable migraine without mention of status migrainosus   . OSA (obstructive sleep apnea) 12/25/2016  . Other and unspecified hyperlipidemia   . Panic attacks   . Postmenopausal symptoms   . Unspecified essential hypertension   . Unspecified urinary incontinence   . UTI (lower urinary tract infection)    on 02/05/16/on meds   Past Surgical History:  Procedure Laterality Date  . ABDOMINAL HYSTERECTOMY  2002  . DIAGNOSTIC LAPAROSCOPY    . dislocated shoulder     right  . tumor right ankle      Current Outpatient Prescriptions:  .  NON FORMULARY, [facility]  Peripheral Neuropathy Cream- Bupivacaine 1%, Doxepin 3%, Gabapentin 6%, Pentoxifylline 3%, Topiramate 1% Apply 1-2 grams to affected area 3-4 times daily Qty. 120 gm 3 refills, Disp: , Rfl:  .  ALPRAZolam (XANAX) 1 MG tablet, Take 1 mg by mouth 2 (two) times daily as needed for anxiety. , Disp: , Rfl:  .  Ascorbic Acid (VITAMIN C DROPS MT), Use as directed 1 Dose in the mouth or throat daily., Disp: , Rfl:  .  Blood Glucose Monitoring Suppl (ACCU-CHEK AVIVA) device, Use as instructed, Disp: 1 each, Rfl: 0 .  budesonide-formoterol (SYMBICORT) 160-4.5 MCG/ACT inhaler,  Inhale 2 puffs into the lungs 2 (two) times daily., Disp: 1 Inhaler, Rfl: 5 .  carvedilol (COREG) 12.5 MG tablet, Take 1 tablet (12.5 mg total) by mouth 2 (two) times daily., Disp: 60 tablet, Rfl: 6 .  Cholecalciferol (VITAMIN D3) 1000000 UNIT/GM LIQD, by Does not apply route., Disp: , Rfl:  .  Cholecalciferol (VITAMIN D3) 5000 units CAPS, Take 10,000 Units by mouth daily., Disp: , Rfl:  .  furosemide (LASIX) 40 MG tablet, Take 40 mg by mouth daily., Disp: , Rfl:  .  gabapentin (NEURONTIN) 100 MG capsule, Take 1 capsule (100 mg total) by mouth 2 (two) times daily., Disp: 90 capsule, Rfl: 3 .  glucose blood (ACCU-CHEK AVIVA) test strip, Use as instructed four times per day, Disp: 400 each, Rfl: 12 .  HYDROcodone-acetaminophen (NORCO) 7.5-325 MG tablet, Take 1 tablet by mouth every 6 (six) hours as needed for moderate pain., Disp: 30 tablet, Rfl: 0 .  ibuprofen (ADVIL,MOTRIN) 800 MG tablet, TAKE 1 TABLET(800 MG) BY MOUTH EVERY 8 HOURS AS NEEDED, Disp: 60 tablet, Rfl: 0 .  Insulin Glargine (LANTUS SOLOSTAR) 100 UNIT/ML Solostar Pen, Inject 20 Units into the skin daily at 10 pm., Disp: 10 pen, Rfl: 1 .  irbesartan (AVAPRO) 300 MG tablet, Take 1 tablet (300 mg total) by mouth daily., Disp: 90 tablet, Rfl: 3 .  ketoconazole (NIZORAL) 2 % cream, Apply 1 fingertip amount to each foot daily., Disp:  30 g, Rfl: 0 .  metFORMIN (GLUCOPHAGE-XR) 500 MG 24 hr tablet, Take 1 tablet (500 mg total) by mouth 2 (two) times daily with a meal., Disp: 180 tablet, Rfl: 3 .  potassium chloride (K-DUR) 10 MEQ tablet, Take 1 tablet (10 mEq total) by mouth daily., Disp: 30 tablet, Rfl: 5  Allergies  Allergen Reactions  . Compazine Other (See Comments)    Stroke like symptoms  . Haloperidol Decanoate Other (See Comments)    Extrapyramidal syndrome  . Penicillins Nausea And Vomiting  . Glipizide Other (See Comments)    dyspnea  . Metformin And Related Other (See Comments)    GI upset  . Codeine Itching  . Lisinopril  Cough   Review of Systems Objective:   Vitals:   04/02/17 1357  BP: (!) 154/98  Pulse: 96   General AA&O x3. Normal mood and affect.  Vascular Dorsalis pedis and posterior tibial pulses  present 1+ bilaterally  Capillary refill normal to all digits. Pedal hair growth absent.  Neurologic Epicritic sensation present bilaterally. Protective sensation with 5.07 monofilament  absent (3/5 sites) bilaterally. Vibratory sensation diminished bilaterally.  Dermatologic No open lesions. Interspaces clear of maceration.  Normal skin temperature and turgor. Hyperkeratotic lesions: none bilaterally. Nails: brittle, onychomycosis, thickening. Superficial white discoloration to the nails.  Orthopedic: No history of amputation. MMT 5/5 in dorsiflexion, plantarflexion, inversion, and eversion. Normal lower extremity joint ROM without pain or crepitus.   Assessment & Plan:  Patient was evaluated and treated and all questions answered.  Diabetes with DPN, Onychomycosis -Educated on diabetic footcare. Diabetic risk level 1 -Nails x10 debrided sharply and manually with large nail nipper and rotary burr. -Rx compound neuropathic cream for painful neuropathy.  Procedure: Nail Debridement Rationale: Patient meets criteria for routine foot care due to Class C findings Type of Debridement: manual, sharp debridement. Instrumentation: Nail nipper, rotary burr. Number of Nails: 10  Onychomycosis -Educated on etiology of nail fungus. -Rx Ketoconazole for nails.  Return in about 3 months (around 07/03/2017).

## 2017-04-02 NOTE — Assessment & Plan Note (Signed)
Ok for tx for acute pain only, likely flare DJD with effusion; for 1 wk limited hydrocodone prn, f/u orthopedic, but I d/w pt I do not treat chronic pain, consider pain management referral

## 2017-04-02 NOTE — Patient Instructions (Signed)
Please take all new medication as prescribed - the pain medication  Please see Your rheumatologist, or Sports Medicine for further pain control, as I do not prescribed the hydrocodone for chronic pain  Please continue all other medications as before, and refills have been done if requested.  Please have the pharmacy call with any other refills you may need.  Please keep your appointments with your specialists as you may have planned

## 2017-04-02 NOTE — Patient Instructions (Signed)

## 2017-04-12 DIAGNOSIS — G4733 Obstructive sleep apnea (adult) (pediatric): Secondary | ICD-10-CM | POA: Diagnosis not present

## 2017-04-17 ENCOUNTER — Ambulatory Visit: Payer: Medicare Other | Admitting: Internal Medicine

## 2017-04-17 ENCOUNTER — Telehealth: Payer: Self-pay | Admitting: Cardiovascular Disease

## 2017-04-17 NOTE — Telephone Encounter (Signed)
Pt c/o Shortness Of Breath: STAT if SOB developed within the last 24 hours or pt is noticeably SOB on the phone  1. Are you currently SOB (can you hear that pt is SOB on the phone)? no  2. How long have you been experiencing SOB? Few days  3. Are you SOB when sitting or when up moving around? Up moving around   4. Are you currently experiencing any other symptoms? Very tired

## 2017-04-17 NOTE — Telephone Encounter (Signed)
Attempted to return call to patient. Her VM box is full

## 2017-04-17 NOTE — Telephone Encounter (Signed)
Returned call to patient of Dr. Oval Linsey - last seen by Isaac Laud, [place] on 9/26.   She gets SOB of breath when she tries to do anything, c/o fatigue - she states she has diabetes so she is aware she will feel fatigued from this. She felt excessively sleepy yesterday. She reports avoiding salt & sodium in her diet but did eat fried fish yesterday. She denies swelling, denies weight gain - has lost 10lbs according to her home scales.   If needs follow up, cannot come in until next week. She is out of town on a work assignment  Will route to MD/DOD to review & advise on symptoms - try extra lasix for a few days?  OK to leave VM with any instructions per MD

## 2017-04-17 NOTE — Telephone Encounter (Signed)
Would not change meds; monitor symptoms; review with Dr Theophilus Kinds

## 2017-04-18 NOTE — Telephone Encounter (Signed)
Patient called with MD recommendations and she voiced understanding.

## 2017-04-30 ENCOUNTER — Telehealth: Payer: Self-pay | Admitting: Internal Medicine

## 2017-04-30 NOTE — Telephone Encounter (Signed)
Called and advised patient of note, patient stated she already takes 20 units, I spoke with Dr.Gherghe who advised to increase to 24 units, and check sugars in 3 days if they do not come down to up by 4 more units. I advised patient once she hits the 30 unit mark to call us back. Patient understood, no other questions.

## 2017-04-30 NOTE — Telephone Encounter (Signed)
Caller Name: Susann Lawhorne  Best Number: [phone]  Pharmacy: [facility] [address]  Reason for call:  Pt called and "her sugars have been running high.  Today her sugar was  272.  Yesterday it was 275".  She is requesting a call back and classes to help her regulate.  She has been throwing up, nauseated, full not feeling like eatiig.  Offered appointment, pt states she cannot come in

## 2017-04-30 NOTE — Telephone Encounter (Signed)
Let's increase Lantus from 16 to 20 units and may need to increase more in 3 days if sugars not improving. Drink plenty of water >> may need to go to the hospital if she cannot fluids down.Marland KitchenMarland Kitchen

## 2017-05-01 ENCOUNTER — Ambulatory Visit (INDEPENDENT_AMBULATORY_CARE_PROVIDER_SITE_OTHER): Payer: Medicare Other | Admitting: Family Medicine

## 2017-05-01 ENCOUNTER — Other Ambulatory Visit (INDEPENDENT_AMBULATORY_CARE_PROVIDER_SITE_OTHER): Payer: Medicare Other

## 2017-05-01 ENCOUNTER — Encounter: Payer: Self-pay | Admitting: Family Medicine

## 2017-05-01 ENCOUNTER — Ambulatory Visit (INDEPENDENT_AMBULATORY_CARE_PROVIDER_SITE_OTHER)
Admission: RE | Admit: 2017-05-01 | Discharge: 2017-05-01 | Disposition: A | Discharge status: Home / Self Care | Payer: Medicare Other | Source: Ambulatory Visit | Attending: Family Medicine | Admitting: Family Medicine

## 2017-05-01 VITALS — BP 138/76 | HR 110 | Temp 98.7°F | Ht 67.0 in | Wt 203.0 lb

## 2017-05-01 DIAGNOSIS — R112 Nausea with vomiting, unspecified: Secondary | ICD-10-CM | POA: Diagnosis not present

## 2017-05-01 DIAGNOSIS — E1165 Type 2 diabetes mellitus with hyperglycemia: Secondary | ICD-10-CM

## 2017-05-01 DIAGNOSIS — R1110 Vomiting, unspecified: Secondary | ICD-10-CM | POA: Diagnosis not present

## 2017-05-01 DIAGNOSIS — IMO0002 Reserved for concepts with insufficient information to code with codable children: Secondary | ICD-10-CM

## 2017-05-01 DIAGNOSIS — E1140 Type 2 diabetes mellitus with diabetic neuropathy, unspecified: Secondary | ICD-10-CM

## 2017-05-01 LAB — CBC
HCT: 38.7 % (ref 36.0–46.0)
Hemoglobin: 12.2 g/dL (ref 12.0–15.0)
MCHC: 31.5 g/dL (ref 30.0–36.0)
MCV: 86.6 fl (ref 78.0–100.0)
Platelets: 358 10*3/uL (ref 150.0–400.0)
RBC: 4.47 Mil/uL (ref 3.87–5.11)
RDW: 17.4 % — ABNORMAL HIGH (ref 11.5–15.5)
WBC: 15.3 10*3/uL — ABNORMAL HIGH (ref 4.0–10.5)

## 2017-05-01 LAB — COMPREHENSIVE METABOLIC PANEL
ALT: 16 U/L (ref 0–35)
AST: 14 U/L (ref 0–37)
Albumin: 3.9 g/dL (ref 3.5–5.2)
Alkaline Phosphatase: 146 U/L — ABNORMAL HIGH (ref 39–117)
BUN: 6 mg/dL (ref 6–23)
CO2: 29 mEq/L (ref 19–32)
Calcium: 9.5 mg/dL (ref 8.4–10.5)
Chloride: 95 mEq/L — ABNORMAL LOW (ref 96–112)
Creatinine, Ser: 0.65 mg/dL (ref 0.40–1.20)
GFR: 116.97 mL/min (ref >60.00)
Glucose, Bld: 223 mg/dL — ABNORMAL HIGH (ref 70–99)
Potassium: 2.9 mEq/L — ABNORMAL LOW (ref 3.5–5.1)
Sodium: 135 mEq/L (ref 135–145)
Total Bilirubin: 1.5 mg/dL — ABNORMAL HIGH (ref 0.2–1.2)
Total Protein: 8.4 g/dL — ABNORMAL HIGH (ref 6.0–8.3)

## 2017-05-01 LAB — URINALYSIS, ROUTINE W REFLEX MICROSCOPIC
Ketones, ur: NEGATIVE
Leukocytes, UA: NEGATIVE
Nitrite: NEGATIVE
Specific Gravity, Urine: 1.025 (ref 1.000–1.030)
Total Protein, Urine: >=300 — AB
Urine Glucose: NEGATIVE
Urobilinogen, UA: 0.2 (ref 0.0–1.0)
pH: 7 (ref 5.0–8.0)

## 2017-05-01 MED ORDER — ONDANSETRON 8 MG PO TBDP: 8.0000 mg | ORAL_TABLET | Freq: Three times a day (TID) | ORAL | 3 refills | Status: DC | PRN

## 2017-05-01 NOTE — Assessment & Plan Note (Signed)
Does not appear that she has any form of obstruction. Possible that she has acute viral episodes related to this. Could be associated with gastroparesis and her diabetes. May be fluid down as tachycardic as she is today. Advised to stop the metformin due to any kind of kidney injury. - Urinalysis to check for ketones, abdominal x-ray to check for constipation, CBC, - Zofran for nausea - Call patient with results. Does not have any acute kidney injury. Does not have any ketones in urine but does have proteinuria. Abdominal x-ray does show moderate stools advise MiraLAX may need to send and senna. Advised to continue fluids and if still unable to tolerate fluids then go to the emergency room in order to have hydration

## 2017-05-01 NOTE — Assessment & Plan Note (Signed)
She reports no intolerance to the extended-release metformin. She is currently taking insulin as well - Advised her to stop the metformin as she may have a kidney injury. She discussed switching back to the previous version with her primary doctor - Advised to continue insulin - If emesis does not appear to be related to infection or obstruction or constipation, could need a gastric emptying study at some point.

## 2017-05-01 NOTE — Progress Notes (Signed)
Gloria [name] - 66 y.o. female MRN [id number]  Date of birth: [birth year]  SUBJECTIVE:  Including CC & ROS.  Chief Complaint  Patient presents with  . Emesis    present for 2 days, denies abdominal pain. Denies fevers. Nausea present for 2 days. She cannot keep any food down. Moving bowels regular, denies blood in stool .    Gloria [name] is a 66 y.o. female that is Presenting with 2 days of acute emesis. Denies any fevers. Just unable to eat without vomiting afterwards. Does not have any significant abdominal pain but feels like her abdomen is harder than usual. Has not had any changing bowel movements. Has changed to a metformin extended release. The previous version metformin was causing her have diarrhea. She is on insulin for her diabetes. Has not tried any over-the-counter medications. Has been taking Motrin for her knee pain. Denies any rashes. Vomiting is non-bloody..   Review of her blood work from 6/22 shows a hemoglobin A1c of 8.5.  Her blood work from 9/26 shows Low potassium and chloride and normal kidney function.  Review of her colonoscopy from 2012 shows severe sigmoid diverticulosis 2 polyps but otherwise normal. Was advised to have a repeat in 5 years.   Review of her CT abdomen from 2002 shows postop changes in minimal edema within the abdominal fat. Shows evidence of previous pelvic surgery with suprapubic catheter and enlarged lymph nodes  Review of Systems  Constitutional: Negative for fever.  Gastrointestinal: Positive for vomiting. Negative for diarrhea.  Neurological: Negative for weakness.  Hematological: Negative for adenopathy.    HISTORY: Past Medical, Surgical, Social, and Family History Reviewed & Updated per EMR.   Pertinent Historical Findings include:  Past Medical History:  Diagnosis Date  . Allergic rhinitis, cause unspecified 03/30/2013  . Anxiety state, unspecified   . Arthritis   . Asthma   . Borderline diabetes mellitus    a1c 7.0 (07/26/13)   .  Chronic bronchitis   . Depressive disorder, not elsewhere classified   . Diabetes mellitus without complication ([place])   . Difficulty waking    hard to wake up past sedation!  . Migraine, unspecified, without mention of intractable migraine without mention of status migrainosus   . OSA (obstructive sleep apnea) 12/25/2016  . Other and unspecified hyperlipidemia   . Panic attacks   . Postmenopausal symptoms   . Unspecified essential hypertension   . Unspecified urinary incontinence   . UTI (lower urinary tract infection)    on 02/05/16/on meds    Past Surgical History:  Procedure Laterality Date  . ABDOMINAL HYSTERECTOMY  2002  . DIAGNOSTIC LAPAROSCOPY    . dislocated shoulder     right  . tumor right ankle      Allergies  Allergen Reactions  . Compazine Other (See Comments)    Stroke like symptoms  . Haloperidol Decanoate Other (See Comments)    Extrapyramidal syndrome  . Penicillins Nausea And Vomiting  . Glipizide Other (See Comments)    dyspnea  . Metformin And Related Other (See Comments)    GI upset  . Codeine Itching  . Lisinopril Cough    Family History  Problem Relation Age of Onset  . Arthritis Unknown        family history  . Hypertension Unknown        grandparent  . Colon cancer Maternal Grandfather   . Asthma Son   . Rheum arthritis Mother   . Hypertension Mother   . Diabetes Maternal  Uncle   . Diabetes Paternal Uncle   . Diabetes Maternal Grandmother      Social History   Social History  . Marital status: Divorced    Spouse name: N/A  . Number of children: 1  . Years of education: N/A   Occupational History  . RETIRED ---Self employed-interior design/flower arrangements    Social History Main Topics  . Smoking status: Never Smoker  . Smokeless tobacco: Never Used  . Alcohol use No  . Drug use: No  . Sexual activity: Not Currently   Other Topics Concern  . Not on file   Social History Narrative   -Retired - does Company secretary    -Divorced in 2007   -One son - lives locally. 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 and entertain family. Bowling. Interior decorating           PHYSICAL EXAM:  VS: BP 138/76 (BP Location: Left Arm, Patient Position: Sitting, Cuff Size: Normal)   Pulse (!) 110   Temp 98.7 F (37.1 C) (Oral)   Ht 5\' 7"  (1.702 m)   Wt 203 lb (92.1 kg)   SpO2 99%   BMI 31.79 kg/m  Physical Exam Gen: NAD, alert, cooperative with exam,  ENT: normal lips, normal nasal mucosa,  Eye: normal EOM, normal conjunctiva and lids CV:  no edema, +2 pedal pulses, Tachycardic, S1-S2   Resp: no accessory muscle use, non-labored, clear to auscultation bilaterally GI: no masses or tenderness, no hernia, hypoactive bowel sounds, non-distended  Skin: no rashes, no areas of induration  Neuro: normal tone, normal sensation to touch Psych:  normal insight, alert and oriented MSK: Normal gait, normal strength      ASSESSMENT & PLAN:   Non-intractable vomiting with nausea Does not appear that she has any form of obstruction. Possible that she has acute viral episodes related to this. Could be associated with gastroparesis and her diabetes. May be fluid down as tachycardic as she is today. Advised to stop the metformin due to any kind of kidney injury. - Urinalysis to check for ketones, abdominal x-ray to check for constipation, CBC, - Zofran for nausea - Call patient with results. Does not have any acute kidney injury. Does not have any ketones in urine but does have proteinuria. Abdominal x-ray does show moderate stools advise MiraLAX may need to send and senna. Advised to continue fluids and if still unable to tolerate fluids then go to the emergency room in order to have hydration  Uncontrolled type 2 diabetes mellitus with diabetic neuropathy, without long-term current use of insulin ([place]) She reports no intolerance to the extended-release metformin. She is currently  taking insulin as well - Advised her to stop the metformin as she may have a kidney injury. She discussed switching back to the previous version with her primary doctor - Advised to continue insulin - If emesis does not appear to be related to infection or obstruction or constipation, could need a gastric emptying study at some point.

## 2017-05-01 NOTE — Patient Instructions (Addendum)
Thank you for coming in,   Please stop the metformin.   We will call you with the results from today.   Please do not take any anti-inflammatories if you continue to have vomiting.   Please go to the emergency room if your vomiting continues and you are not able to drink fluids.   Please try to stay well hydrated with drinking plenty of water.    Please feel free to call with any questions or concerns at any time, at [phone]. --Dr. Raeford Razor  Diet Recommendations for Diabetes   Starchy (carb) foods include: Bread, rice, pasta, potatoes, corn, crackers, bagels, muffins, all baked goods.   Protein foods include: Meat, fish, poultry, eggs, dairy foods, and beans such as pinto and kidney beans (beans also provide carbohydrate).   1. Eat at least 3 meals and 1-2 snacks per day. Never go more than 4-5 hours while awake without eating.  2. Limit starchy foods to TWO per meal and ONE per snack. ONE portion of a starchy  food is equal to the following:   - ONE slice of bread (or its equivalent, such as half of a hamburger bun).   - 1/2 cup of a "scoopable" starchy food such as potatoes or rice.   - 1 OUNCE (28 grams) of starchy snack foods such as crackers or pretzels (look on label).   - 15 grams of carbohydrate as shown on food label.  3. Both lunch and dinner should include a protein food, a carb food, and vegetables.   - Obtain twice as many veg's as protein or carbohydrate foods for both lunch and dinner.   - Try to keep frozen veg's on hand for a quick vegetable serving.     - Fresh or frozen veg's are best.  4. Breakfast should always include protein.

## 2017-05-08 ENCOUNTER — Ambulatory Visit (INDEPENDENT_AMBULATORY_CARE_PROVIDER_SITE_OTHER): Payer: Medicare Other | Admitting: Cardiovascular Disease

## 2017-05-08 ENCOUNTER — Encounter: Payer: Self-pay | Admitting: Cardiovascular Disease

## 2017-05-08 VITALS — BP 148/82 | HR 98 | Ht 67.0 in | Wt 196.8 lb

## 2017-05-08 DIAGNOSIS — I10 Essential (primary) hypertension: Secondary | ICD-10-CM

## 2017-05-08 DIAGNOSIS — I5042 Chronic combined systolic (congestive) and diastolic (congestive) heart failure: Secondary | ICD-10-CM

## 2017-05-08 MED ORDER — BLOOD PRESSURE MONITOR DEVI: 0 refills | Status: DC

## 2017-05-08 NOTE — Patient Instructions (Addendum)
Please call and let us known if you were taking carvedilol.  [phone]  Medication Instructions:  Your physician recommends that you continue on your current medications as directed. Please refer to the Current Medication list given to you today.  Labwork: NONE  Testing/Procedures: NONE  Follow-Up: Your physician recommends that you schedule a follow-up appointment in:  2 MONTH OV  Any Other Special Instructions Will Be Listed Below (If Applicable). MONITOR YOUR BLOOD PRESSURE AT HOME AND BRING YOUR READINGS TO YOUR FOLLOW UP   If you need a refill on your cardiac medications before your next appointment, please call your pharmacy.

## 2017-05-08 NOTE — Progress Notes (Signed)
Cardiology Office Note   Date:  05/08/2017   ID:  Gloria [name], DOB 02/14/1951, MRN [id number]  PCP:  Biagio Borg, MD  Cardiologist:   Skeet Latch, MD   No chief complaint on file.     History of Present Illness: Gloria [name] is a 66 y.o. female with chronic systolic and diastolic heart failure, hypertension, hyperlipidemia, diabetes, and morbid obesity who presents for follow up.  She was initially seen 03/2017 for the evaluation of an abnormal echo.  Gloria [name] had an echo 03/21/16 that revealed LVEF 50% with grade 2 diastolic dysfunction. There was also mild mitral regurgitation. It was recommended that she have a limited echo with Definity contrast to better evaluate for regional wall motion.  She was referred for a Lexiscan Myoview 05/17/16 that showed LVEF 45% with breast attenuation artifact but no ischemia.  At her last appointment she was dyspneic and volume overloaded.  She was treated with a higher dose of lasix and started on carvedilol.  She followed up with Almyra Deforest, PA, on 03/2017.  Her blood pressure was slightly above goal so carvedilol was increased to 12.5 mg twice daily.    Since her last appointment Gloria [name] has been feeling well.  She denies shortness of breath.  She has been trying to walk more for exercise.  She walks to her mailbox and back twice daily.  She is limited by neuropathy in her legs.  She has no chest pain or shortness of breath with this activity.  She is unsure whether she actually started taking carvedilol at her last appointment.  She did purchase it but thinks that she may not have actually put it in her pillbox.  She has not been checking her blood pressure at home but would like to get a blood pressure cuff to do so.  She has not noted any chest pain or lower extremity edema, orthopnea, or PND.  Gloria [name] has been very stressed lately.  She has several family members and friends with cancer and other serious illnesses.     Past Medical History:    Diagnosis Date  . Allergic rhinitis, cause unspecified 03/30/2013  . Anxiety state, unspecified   . Arthritis   . Asthma   . Borderline diabetes mellitus    a1c 7.0 (07/26/13)   . Chronic bronchitis   . Depressive disorder, not elsewhere classified   . Diabetes mellitus without complication ([place])   . Difficulty waking    hard to wake up past sedation!  . Migraine, unspecified, without mention of intractable migraine without mention of status migrainosus   . OSA (obstructive sleep apnea) 12/25/2016  . Other and unspecified hyperlipidemia   . Panic attacks   . Postmenopausal symptoms   . Unspecified essential hypertension   . Unspecified urinary incontinence   . UTI (lower urinary tract infection)    on 02/05/16/on meds    Past Surgical History:  Procedure Laterality Date  . ABDOMINAL HYSTERECTOMY  2002  . DIAGNOSTIC LAPAROSCOPY    . dislocated shoulder     right  . tumor right ankle       Current Outpatient Medications  Medication Sig Dispense Refill  . ALPRAZolam (XANAX) 1 MG tablet Take 1 mg by mouth 2 (two) times daily as needed for anxiety.     . Ascorbic Acid (VITAMIN C DROPS MT) Use as directed 1 Dose in the mouth or throat daily.    . Blood Glucose Monitoring Suppl (ACCU-CHEK AVIVA) device Use  as instructed 1 each 0  . budesonide-formoterol (SYMBICORT) 160-4.5 MCG/ACT inhaler Inhale 2 puffs 2 (two) times daily as needed into the lungs.    . carvedilol (COREG) 12.5 MG tablet Take 1 tablet (12.5 mg total) by mouth 2 (two) times daily. 60 tablet 6  . furosemide (LASIX) 40 MG tablet Take 40 mg by mouth daily.    Marland Kitchen glucose blood (ACCU-CHEK AVIVA) test strip Use as instructed four times per day 400 each 12  . ibuprofen (ADVIL,MOTRIN) 800 MG tablet TAKE 1 TABLET(800 MG) BY MOUTH EVERY 8 HOURS AS NEEDED 60 tablet 0  . Insulin Glargine (LANTUS SOLOSTAR) 100 UNIT/ML Solostar Pen Inject 24 Units daily into the skin.     Marland Kitchen irbesartan (AVAPRO) 300 MG tablet Take 1 tablet (300 mg  total) by mouth daily. 90 tablet 3  . ketoconazole (NIZORAL) 2 % cream Apply 1 fingertip amount to each foot daily. 30 g 0  . NON FORMULARY [facility]  Peripheral Neuropathy Cream- Bupivacaine 1%, Doxepin 3%, Gabapentin 6%, Pentoxifylline 3%, Topiramate 1% Apply 1-2 grams to affected area 3-4 times daily Qty. 120 gm 3 refills    . ondansetron (ZOFRAN-ODT) 8 MG disintegrating tablet Take 1 tablet (8 mg total) by mouth every 8 (eight) hours as needed for nausea. 20 tablet 3  . potassium chloride (K-DUR) 10 MEQ tablet Take 1 tablet (10 mEq total) by mouth daily. 30 tablet 5  . Blood Pressure Monitor DEVI Take your blood pressure daily. Diagnosis I10 1 Device 0   No current facility-administered medications for this visit.     Allergies:   Compazine; Haloperidol decanoate; Penicillins; Glipizide; Metformin and related; Codeine; and Lisinopril    Social History:  The patient  reports that  has never smoked. she has never used smokeless tobacco. She reports that she does not drink alcohol or use drugs.   Family History:  The patient's family history includes Arthritis in her unknown relative; Asthma in her son; Colon cancer in her maternal grandfather; Diabetes in her maternal grandmother, maternal uncle, and paternal uncle; Hypertension in her mother and unknown relative; Rheum arthritis in her mother.    ROS:  Please see the history of present illness.   Otherwise, review of systems are positive for insomnia.   All other systems are reviewed and negative.    PHYSICAL EXAM: VS:  BP (!) 148/82   Pulse 98   Ht 5\' 7"  (1.702 m)   Wt 89.3 kg (196 lb 12.8 oz)   SpO2 99%   BMI 30.82 kg/m  , BMI Body mass index is 30.82 kg/m. GENERAL:  Well appearing.  No acute distress HEENT: Pupils equal round and reactive, fundi not visualized, oral mucosa unremarkable NECK:  No jugular venous distention, waveform within normal limits, carotid upstroke brisk and symmetric, no bruits, no  thyromegaly LUNGS:  Clear to auscultation bilaterally HEART:  RRR.  PMI not displaced or sustained,S1 and S2 within normal limits, no S3, no S4, no clicks, no rubs, no murmurs ABD:  Flat, positive bowel sounds normal in frequency in pitch, no bruits, no rebound, no guarding, no midline pulsatile mass, no hepatomegaly, no splenomegaly EXT:  2 plus pulses throughout, no edema, no cyanosis no clubbing SKIN:  No rashes no nodules NEURO:  Cranial nerves II through XII grossly intact, motor grossly intact throughout PSYCH:  Cognitively intact, oriented to person place and time   EKG:  EKG is ordered today. The ekg ordered today demonstrates sinus rhythm rate 89 bpm.  Non-specific  ST-T changes. 03/21/17: Sinus tachycardia.  Rate 106 bpm.  LVH.  Lexiscan Myoview 05/17/16:   The left ventricular ejection fraction is mildly decreased (45-54%).  Nuclear stress EF: 45%.  Defect 1: There is a medium defect of mild severity present in the mid anterior, mid anteroseptal and apical anterior location. This appears most consistent with shifting breast artifact.  This is a low risk study.   Echo 03/21/16: Study Conclusions  - Left ventricle: Global longitudinal strain is -14.3% The cavity   size was mildly dilated. Wall thickness was normal. Systolic   function was normal. The estimated ejection fraction was in the   range of 50%. Features are consistent with a pseudonormal left   ventricular filling pattern, with concomitant abnormal relaxation   and increased filling pressure (grade 2 diastolic dysfunction). - Mitral valve: There was mild regurgitation. - Left atrium: The atrium was mildly dilated.  Impressions:  - Would recomm limited echo with Definity use to further evaluate   regional wall motion.   Recent Labs: 05/01/2017: ALT 16; BUN 6; Creatinine, Ser 0.65; Hemoglobin 12.2; Platelets 358.0; Potassium 2.9; Sodium 135    Lipid Panel    Component Value Date/Time   CHOL 91  02/27/2017 1334   TRIG 93.0 02/27/2017 1334   TRIG 558 01/25/2010   HDL 26.60 (L) 02/27/2017 1334   CHOLHDL 3 02/27/2017 1334   VLDL 18.6 02/27/2017 1334   LDLCALC 45 02/27/2017 1334   LDLDIRECT 64.0 10/03/2015 1634      Wt Readings from Last 3 Encounters:  05/08/17 89.3 kg (196 lb 12.8 oz)  05/01/17 92.1 kg (203 lb)  04/02/17 94.8 kg (209 lb)      ASSESSMENT AND PLAN:  # Chronic systolic and diastolic heart failure:  # Hypertension: Ms. Vanvleck is euvolemic.  She is feeling much better.  Her weight is down 11 pounds.  She will go home and check to see if she is taking carvedilol.  Her blood pressure remains above goal.  She is taking carvedilol we will increase it to 25 mg twice daily.  If she is not taking it she will start.  Continue irbesartan 300 mg daily. We will get her a prescription for a BP cuff.    # Diabetes: Given her heart failure, she may be a good candidate for an SGLT2 inhibitor such as Januvia.   Current medicines are reviewed at length with the patient today.  The patient does not have concerns regarding medicines.  The following changes have been made:  no change  Labs/ tests ordered today include:   No orders of the defined types were placed in this encounter.    Disposition:   FU with Cattie Tineo C. Oval Linsey, MD, [facility] in  2 months.   This note was written with the assistance of speech recognition software.  Please excuse any transcriptional errors.  Signed, Lesa Vandall C. Oval Linsey, MD, [facility]  05/08/2017 5:30 PM    [place]

## 2017-05-12 ENCOUNTER — Telehealth: Payer: Self-pay | Admitting: Cardiovascular Disease

## 2017-05-12 NOTE — Telephone Encounter (Signed)
Patient calling, states that she lost the prescription for the Carvedilol medication and asked that our office call it for her.

## 2017-05-12 NOTE — Telephone Encounter (Signed)
Rx has been sent to Gloria [name] Pt notified she will call pharmacy and have filled

## 2017-05-13 DIAGNOSIS — G4733 Obstructive sleep apnea (adult) (pediatric): Secondary | ICD-10-CM | POA: Diagnosis not present

## 2017-05-20 ENCOUNTER — Other Ambulatory Visit: Payer: Self-pay | Admitting: Internal Medicine

## 2017-05-30 ENCOUNTER — Ambulatory Visit: Payer: Medicare Other | Admitting: Internal Medicine

## 2017-06-12 DIAGNOSIS — G4733 Obstructive sleep apnea (adult) (pediatric): Secondary | ICD-10-CM | POA: Diagnosis not present

## 2017-06-13 ENCOUNTER — Other Ambulatory Visit: Payer: Self-pay | Admitting: Internal Medicine

## 2017-06-20 ENCOUNTER — Other Ambulatory Visit: Payer: Self-pay | Admitting: Internal Medicine

## 2017-06-26 ENCOUNTER — Telehealth: Payer: Self-pay | Admitting: Internal Medicine

## 2017-06-26 NOTE — Telephone Encounter (Signed)
Pt called to report Insulin Pen "not working." States insulin in pen but "won't come out." States called pharmacy but they told her to call office.  Instructed pt to take pen and all supplies TO pharmacy to see if they could troubleshoot. Instructed to call back if unresolved.

## 2017-07-03 ENCOUNTER — Ambulatory Visit: Payer: Medicare Other | Admitting: Podiatry

## 2017-07-13 DIAGNOSIS — G4733 Obstructive sleep apnea (adult) (pediatric): Secondary | ICD-10-CM | POA: Diagnosis not present

## 2017-07-31 ENCOUNTER — Ambulatory Visit: Payer: Medicare Other | Admitting: Internal Medicine

## 2017-07-31 NOTE — Progress Notes (Deleted)
Patient ID: Gloria [name], female   DOB: May 28, 1951, 67 y.o.   MRN: [id number]   HPI: Gloria [name] is a 67 y.o.-year-old female, returning for DM2, dx in 2015, non-insulin-dependent, uncontrolled, with complications (peripheral neuropathy). Last visit 5 months ago.  Last hemoglobin A1c was: Lab Results  Component Value Date   HGBA1C 8.5 12/20/2016   HGBA1C 11.4 (H) 11/04/2016   HGBA1C 6.6 04/01/2016  She has asthma  - at change of season  - 2x a year >> Prednisone. She also had knee steroid inj's.   Pt is on: - Metformin ER 500 mg twice a day with meals - Lantus 16 units at bedtime - started 10/2016 >> increased to 20 units  We had to stop Glipizide b/c weakness She was on Metformin >> diarrhea.  Tolerates metformin ER better.  Pt checks sugars 1x a day: - am: n/c >> 160-200 >> 87, 110-130, 200 - 2h after b'fast: n/c - before lunch: n/c - 2h after lunch: n/c - before dinner: n/c - 2h after dinner: n/c >> 189 - bedtime: n/c - nighttime: n/c No lows; unclear at what level she has hypoglycemia awareness. Highest sugar was 200 >> 200s >> ***  Glucometer: Prodigy (talking meter)  Pt's meals are: - Breakfast: oatmeal + berries or banana, coffee + creamer, OJ - Lunch: salad, hamburger - Dinner: fish, chicken - Snacks: 1 She stopped fresh fries, sweets. She drinks diet sodas. No exercise.  - No CKD, last BUN/creatinine:  Lab Results  Component Value Date   BUN 6 05/01/2017   BUN 17 03/26/2017   CREATININE 0.65 05/01/2017   CREATININE 0.71 03/26/2017  on Irbesartan. - + HL; last set of lipids: Lab Results  Component Value Date   CHOL 91 02/27/2017   HDL 26.60 (L) 02/27/2017   LDLCALC 45 02/27/2017   LDLDIRECT 64.0 10/03/2015   TRIG 93.0 02/27/2017   CHOLHDL 3 02/27/2017   - last eye exam was in 06/2015 >> No DR. + cataracts B. - Much improved numbness and tingling in her feet- on Neurontin  She also has a history of HTN.  ROS: Constitutional: no weight gain/no  weight loss, no fatigue, no subjective hyperthermia, no subjective hypothermia Eyes: no blurry vision, no xerophthalmia ENT: no sore throat, no nodules palpated in throat, no dysphagia, no odynophagia, no hoarseness Cardiovascular: no CP/no SOB/no palpitations/no leg swelling Respiratory: no cough/no SOB/no wheezing Gastrointestinal: no N/no V/no D/no C/no acid reflux Musculoskeletal: no muscle aches/no joint aches Skin: no rashes, no hair loss Neurological: no tremors/no numbness/no tingling/no dizziness  I reviewed pt's medications, allergies, PMH, social hx, family hx, and changes were documented in the history of present illness. Otherwise, unchanged from my initial visit note.  Past Medical History:  Diagnosis Date  . Allergic rhinitis, cause unspecified 03/30/2013  . Anxiety state, unspecified   . Arthritis   . Asthma   . Borderline diabetes mellitus    a1c 7.0 (07/26/13)   . Chronic bronchitis   . Depressive disorder, not elsewhere classified   . Diabetes mellitus without complication ([place])   . Difficulty waking    hard to wake up past sedation!  . Migraine, unspecified, without mention of intractable migraine without mention of status migrainosus   . OSA (obstructive sleep apnea) 12/25/2016  . Other and unspecified hyperlipidemia   . Panic attacks   . Postmenopausal symptoms   . Unspecified essential hypertension   . Unspecified urinary incontinence   . UTI (lower urinary tract infection)  on 02/05/16/on meds   Past Surgical History:  Procedure Laterality Date  . ABDOMINAL HYSTERECTOMY  2002  . DIAGNOSTIC LAPAROSCOPY    . dislocated shoulder     right  . tumor right ankle     Social History   Occupational History  . Self employed-interior design/flower arrangements    Social History Main Topics  . Smoking status: Never Smoker  . Smokeless tobacco: Never Used  . Alcohol use No  . Drug use: No   Social History Narrative   -Retired - does Company secretary work    -Divorced in 2007   -One son - lives locally. 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 and entertain family. Bowling. Interior decorating   Current Outpatient Medications on File Prior to Visit  Medication Sig Dispense Refill  . ALPRAZolam (XANAX) 1 MG tablet Take 1 mg by mouth 2 (two) times daily as needed for anxiety.     . Ascorbic Acid (VITAMIN C DROPS MT) Use as directed 1 Dose in the mouth or throat daily.    . Blood Glucose Monitoring Suppl (ACCU-CHEK AVIVA) device Use as instructed 1 each 0  . Blood Pressure Monitor DEVI Take your blood pressure daily. Diagnosis I10 1 Device 0  . budesonide-formoterol (SYMBICORT) 160-4.5 MCG/ACT inhaler Inhale 2 puffs 2 (two) times daily as needed into the lungs.    . carvedilol (COREG) 12.5 MG tablet Take 1 tablet (12.5 mg total) by mouth 2 (two) times daily. 60 tablet 6  . furosemide (LASIX) 40 MG tablet Take 40 mg by mouth daily.    Marland Kitchen glucose blood (ACCU-CHEK AVIVA) test strip Use as instructed four times per day 400 each 12  . ibuprofen (ADVIL,MOTRIN) 800 MG tablet TAKE 1 TABLET(800 MG) BY MOUTH EVERY 8 HOURS AS NEEDED 60 tablet 0  . Insulin Glargine (LANTUS SOLOSTAR) 100 UNIT/ML Solostar Pen Inject 24 Units daily into the skin.     . Insulin Pen Needle (BD PEN NEEDLE NANO U/F) 32G X 4 MM MISC USE ONCE DAILY 100 each 0  . irbesartan (AVAPRO) 300 MG tablet Take 1 tablet (300 mg total) by mouth daily. 90 tablet 3  . ketoconazole (NIZORAL) 2 % cream Apply 1 fingertip amount to each foot daily. 30 g 0  . LANTUS SOLOSTAR 100 UNIT/ML Solostar Pen ADMINISTER 20 UNITS UNDER THE SKIN DAILY AT 10 PM 9 mL 0  . NON FORMULARY [facility]  Peripheral Neuropathy Cream- Bupivacaine 1%, Doxepin 3%, Gabapentin 6%, Pentoxifylline 3%, Topiramate 1% Apply 1-2 grams to affected area 3-4 times daily Qty. 120 gm 3 refills    . ondansetron (ZOFRAN-ODT) 8 MG disintegrating tablet Take 1 tablet (8 mg total) by mouth every 8  (eight) hours as needed for nausea. 20 tablet 3  . potassium chloride (K-DUR) 10 MEQ tablet Take 1 tablet (10 mEq total) by mouth daily. 30 tablet 5   No current facility-administered medications on file prior to visit.    Allergies  Allergen Reactions  . Compazine Other (See Comments)    Stroke like symptoms  . Haloperidol Decanoate Other (See Comments)    Extrapyramidal syndrome  . Penicillins Nausea And Vomiting  . Glipizide Other (See Comments)    dyspnea  . Metformin And Related Other (See Comments)    GI upset  . Codeine Itching  . Lisinopril Cough   Family History  Problem Relation Age of Onset  . Arthritis Unknown  family history  . Hypertension Unknown        grandparent  . Colon cancer Maternal Grandfather   . Asthma Son   . Rheum arthritis Mother   . Hypertension Mother   . Diabetes Maternal Uncle   . Diabetes Paternal Uncle   . Diabetes Maternal Grandmother    PE: There were no vitals taken for this visit. Wt Readings from Last 3 Encounters:  05/08/17 196 lb 12.8 oz (89.3 kg)  05/01/17 203 lb (92.1 kg)  04/02/17 209 lb (94.8 kg)   Constitutional: overweight, in NAD Eyes: PERRLA, EOMI, no exophthalmos ENT: moist mucous membranes, no thyromegaly, no cervical lymphadenopathy Cardiovascular: RRR, No MRG Respiratory: CTA B Gastrointestinal: abdomen soft, NT, ND, BS+ Musculoskeletal: no deformities, strength intact in all 4 Skin: moist, warm, no rashes Neurological: no tremor with outstretched hands, DTR normal in all 4  ASSESSMENT: 1. DM2, insulin-dependent, uncontrolled, without long term complications, but with hyperglycemia  2. Peripheral neuropathy  3. HL  PLAN:  1. Patient with long-standing, uncontrolled, diabetes type 2, on basal insulin and metformin ER.  Her sugars were better at last visit, and her HbA1c decreased to 8.5%.   - She could not tolerate glipizide because of weakness.  - I suggested to:  Patient Instructions  Please  continue: - Lantus 20 units at bedtime - Metformin ER 500 mg 2x a day with meals  Please come back for a follow-up appointment in 3 months.  - today, HbA1c is 7%  - continue checking sugars at different times of the day - check 1-2x a day, rotating checks - advised for yearly eye exams >> she is UTD - Return to clinic in 3 mo with sugar log    2. PN -Improved with improving diabetes control -Continues Neurontin without side effects  3. HL -Reviewed latest lipid panel from last visit: At goal -Not on a statin   Philemon Kingdom, MD PhD [facility]

## 2017-08-01 ENCOUNTER — Ambulatory Visit: Payer: Medicare Other | Admitting: Cardiovascular Disease

## 2017-08-13 DIAGNOSIS — G4733 Obstructive sleep apnea (adult) (pediatric): Secondary | ICD-10-CM | POA: Diagnosis not present

## 2017-08-15 ENCOUNTER — Ambulatory Visit: Payer: Medicare Other | Admitting: Internal Medicine

## 2017-08-19 ENCOUNTER — Telehealth: Payer: Self-pay | Admitting: Nutrition

## 2017-08-19 NOTE — Telephone Encounter (Signed)
Pt. Came in today, insisting she be seen.  She missed last appt. And her next appt. Is for next week.  Dr. Arman Filter schedule was full, and she was not able to work her in.  I saw her for a few minutes.  She did not bring meter, but says FBSs are between 180-257, depending on how much snack she is having at HS.   Taking Lantus 18u HS, and Metformin.   Pt. Reports vomiting qAM for last month, except for last week, when it has only been 3 times.   Typical day:   6-7AM: up.  Checks blood sugar: 180-257.  Today was 257. 8:30AM:  Bfast:  Protein shake.   11:00AM: apple 3-4 times/wk 12:30: Lunch: sandwich, or salad with cheese and 4 crackers and water to drink 4PM: supper of chicken pot pie, or cabbage, with no protein, or cooked spinach with no protein.  Water to drink 6-7pm: apple or apple sauce. 9PM: Gingerale to drink.  Not diet.  Says is sipping on this during the day when she gets nauseated. Suggestions given: 1.Given a new meter: Aviva with extra test stirips.  To test fasting and ac and 2hr. pc one meal each day--alternating the meal each day. 2.  Increase Lantus to 20u.  Call in AM with FBS 3.  Stop all regular sodas.  Switch to diet gingerale.  Also stop all fruit juices and cold cereal and milk 4.  Keep appt. With Dr. Cruzita Lederer next week.  Printed appt. Sheet given.

## 2017-08-21 ENCOUNTER — Other Ambulatory Visit: Payer: Self-pay

## 2017-08-21 ENCOUNTER — Emergency Department (HOSPITAL_COMMUNITY)
Admission: EM | Admit: 2017-08-21 | Discharge: 2017-08-21 | Disposition: A | Discharge status: Home / Self Care | Payer: No Typology Code available for payment source | Attending: Emergency Medicine | Admitting: Emergency Medicine

## 2017-08-21 ENCOUNTER — Encounter (HOSPITAL_COMMUNITY): Payer: Self-pay

## 2017-08-21 ENCOUNTER — Emergency Department (HOSPITAL_COMMUNITY): Payer: No Typology Code available for payment source

## 2017-08-21 DIAGNOSIS — Y998 Other external cause status: Secondary | ICD-10-CM | POA: Insufficient documentation

## 2017-08-21 DIAGNOSIS — Y939 Activity, unspecified: Secondary | ICD-10-CM | POA: Diagnosis not present

## 2017-08-21 DIAGNOSIS — J45909 Unspecified asthma, uncomplicated: Secondary | ICD-10-CM | POA: Insufficient documentation

## 2017-08-21 DIAGNOSIS — I10 Essential (primary) hypertension: Secondary | ICD-10-CM | POA: Insufficient documentation

## 2017-08-21 DIAGNOSIS — S22000A Wedge compression fracture of unspecified thoracic vertebra, initial encounter for closed fracture: Secondary | ICD-10-CM

## 2017-08-21 DIAGNOSIS — S2231XA Fracture of one rib, right side, initial encounter for closed fracture: Secondary | ICD-10-CM | POA: Diagnosis not present

## 2017-08-21 DIAGNOSIS — S299XXA Unspecified injury of thorax, initial encounter: Secondary | ICD-10-CM | POA: Diagnosis present

## 2017-08-21 DIAGNOSIS — E119 Type 2 diabetes mellitus without complications: Secondary | ICD-10-CM | POA: Diagnosis not present

## 2017-08-21 DIAGNOSIS — E041 Nontoxic single thyroid nodule: Secondary | ICD-10-CM | POA: Diagnosis not present

## 2017-08-21 DIAGNOSIS — Z794 Long term (current) use of insulin: Secondary | ICD-10-CM | POA: Diagnosis not present

## 2017-08-21 DIAGNOSIS — S22080A Wedge compression fracture of T11-T12 vertebra, initial encounter for closed fracture: Secondary | ICD-10-CM | POA: Insufficient documentation

## 2017-08-21 DIAGNOSIS — Y92410 Unspecified street and highway as the place of occurrence of the external cause: Secondary | ICD-10-CM | POA: Insufficient documentation

## 2017-08-21 DIAGNOSIS — S2232XA Fracture of one rib, left side, initial encounter for closed fracture: Secondary | ICD-10-CM | POA: Diagnosis not present

## 2017-08-21 DIAGNOSIS — Z79899 Other long term (current) drug therapy: Secondary | ICD-10-CM | POA: Insufficient documentation

## 2017-08-21 DIAGNOSIS — S279XXA Injury of unspecified intrathoracic organ, initial encounter: Secondary | ICD-10-CM | POA: Diagnosis not present

## 2017-08-21 DIAGNOSIS — R0789 Other chest pain: Secondary | ICD-10-CM | POA: Diagnosis not present

## 2017-08-21 LAB — CBC
HCT: 37.1 % (ref 36.0–46.0)
Hemoglobin: 11.4 g/dL — ABNORMAL LOW (ref 12.0–15.0)
MCH: 27.7 pg (ref 26.0–34.0)
MCHC: 30.7 g/dL (ref 30.0–36.0)
MCV: 90.3 fL (ref 78.0–100.0)
Platelets: 313 10*3/uL (ref 150–400)
RBC: 4.11 MIL/uL (ref 3.87–5.11)
RDW: 16.3 % — ABNORMAL HIGH (ref 11.5–15.5)
WBC: 11 10*3/uL — ABNORMAL HIGH (ref 4.0–10.5)

## 2017-08-21 LAB — BASIC METABOLIC PANEL
Anion gap: 12 (ref 5–15)
BUN: 7 mg/dL (ref 6–20)
CO2: 30 mmol/L (ref 22–32)
Calcium: 9.4 mg/dL (ref 8.9–10.3)
Chloride: 105 mmol/L (ref 101–111)
Creatinine, Ser: 0.69 mg/dL (ref 0.44–1.00)
GFR calc Af Amer: >60 mL/min (ref >60)
GFR calc non Af Amer: >60 mL/min (ref >60)
Glucose, Bld: 139 mg/dL — ABNORMAL HIGH (ref 65–99)
Potassium: 3.4 mmol/L — ABNORMAL LOW (ref 3.5–5.1)
Sodium: 147 mmol/L — ABNORMAL HIGH (ref 135–145)

## 2017-08-21 LAB — CBG MONITORING, ED: Glucose-Capillary: 118 mg/dL — ABNORMAL HIGH (ref 65–99)

## 2017-08-21 MED ORDER — ONDANSETRON 4 MG PO TBDP
8.0000 mg | ORAL_TABLET | Freq: Once | ORAL | Status: AC
  Administered 2017-08-21: 8 mg via ORAL
  Filled 2017-08-21: qty 2

## 2017-08-21 MED ORDER — HYDROCODONE-ACETAMINOPHEN 5-325 MG PO TABS: 1.0000 | ORAL_TABLET | ORAL | 0 refills | Status: DC | PRN

## 2017-08-21 MED ORDER — HYDROMORPHONE HCL 1 MG/ML IJ SOLN
1.0000 mg | Freq: Once | INTRAMUSCULAR | Status: AC
  Administered 2017-08-21: 1 mg via INTRAMUSCULAR
  Filled 2017-08-21: qty 1

## 2017-08-21 MED ORDER — CYCLOBENZAPRINE HCL 10 MG PO TABS: 5.0000 mg | ORAL_TABLET | Freq: Once | ORAL | Status: DC

## 2017-08-21 MED ORDER — CYCLOBENZAPRINE HCL 10 MG PO TABS
10.0000 mg | ORAL_TABLET | Freq: Once | ORAL | Status: AC
  Administered 2017-08-21: 10 mg via ORAL
  Filled 2017-08-21: qty 1

## 2017-08-21 MED ORDER — IOPAMIDOL (ISOVUE-300) INJECTION 61%
INTRAVENOUS | Status: AC
  Administered 2017-08-21: 75 mL
  Filled 2017-08-21: qty 75
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
## 2017-08-21 NOTE — ED Notes (Signed)
PT. CALLED TO ROOM X3 WITH SO ANSWER.

## 2017-08-21 NOTE — ED Notes (Signed)
Pt stable, states understanding of discharge instructions 

## 2017-08-21 NOTE — Discharge Instructions (Signed)
Rest, until you feel better.  Return here, if needed, for problems.
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
## 2017-08-22 ENCOUNTER — Other Ambulatory Visit: Payer: Self-pay | Admitting: Internal Medicine

## 2017-08-22 DIAGNOSIS — E041 Nontoxic single thyroid nodule: Secondary | ICD-10-CM

## 2017-08-26 ENCOUNTER — Ambulatory Visit (INDEPENDENT_AMBULATORY_CARE_PROVIDER_SITE_OTHER): Payer: Medicare Other | Admitting: Internal Medicine

## 2017-08-26 ENCOUNTER — Encounter: Payer: Self-pay | Admitting: Internal Medicine

## 2017-08-26 VITALS — BP 174/86 | HR 96 | Ht 67.0 in | Wt 210.6 lb

## 2017-08-26 DIAGNOSIS — IMO0002 Reserved for concepts with insufficient information to code with codable children: Secondary | ICD-10-CM

## 2017-08-26 DIAGNOSIS — E1165 Type 2 diabetes mellitus with hyperglycemia: Secondary | ICD-10-CM | POA: Diagnosis not present

## 2017-08-26 DIAGNOSIS — G63 Polyneuropathy in diseases classified elsewhere: Secondary | ICD-10-CM

## 2017-08-26 DIAGNOSIS — E785 Hyperlipidemia, unspecified: Secondary | ICD-10-CM

## 2017-08-26 DIAGNOSIS — G629 Polyneuropathy, unspecified: Secondary | ICD-10-CM | POA: Insufficient documentation

## 2017-08-26 DIAGNOSIS — E1140 Type 2 diabetes mellitus with diabetic neuropathy, unspecified: Secondary | ICD-10-CM | POA: Diagnosis not present

## 2017-08-26 LAB — POCT GLYCOSYLATED HEMOGLOBIN (HGB A1C): Hemoglobin A1C: 8.5

## 2017-08-26 MED ORDER — METFORMIN HCL ER 500 MG PO TB24: 500.0000 mg | ORAL_TABLET | Freq: Two times a day (BID) | ORAL | 3 refills | Status: DC

## 2017-08-26 MED ORDER — INSULIN GLARGINE 100 UNIT/ML SOLOSTAR PEN: 24.0000 [IU] | PEN_INJECTOR | Freq: Every day | SUBCUTANEOUS | 11 refills | Status: DC

## 2017-08-26 MED ORDER — INSULIN PEN NEEDLE 32G X 4 MM MISC: 3 refills | Status: DC

## 2017-08-26 MED ORDER — GLUCOSE BLOOD VI STRP: ORAL_STRIP | 11 refills | Status: DC
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
## 2017-08-26 NOTE — Patient Instructions (Addendum)
Please increase Lantus to 24 units.  Continue Metformin ER 500 mg 2x a day.  Please schedule an appt with Antonieta Iba with nutrition.  Please return in 1.5 months with your sugar log.

## 2017-09-10 DIAGNOSIS — G4733 Obstructive sleep apnea (adult) (pediatric): Secondary | ICD-10-CM | POA: Diagnosis not present

## 2017-10-01 ENCOUNTER — Inpatient Hospital Stay: Admission: RE | Admit: 2017-10-01 | Payer: Medicare Other | Source: Ambulatory Visit

## 2017-10-11 DIAGNOSIS — G4733 Obstructive sleep apnea (adult) (pediatric): Secondary | ICD-10-CM | POA: Diagnosis not present

## 2017-10-16 ENCOUNTER — Encounter: Payer: Medicare Other | Admitting: Dietician

## 2017-10-16 ENCOUNTER — Ambulatory Visit: Payer: Medicare Other | Admitting: Internal Medicine
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
## 2017-11-02 IMAGING — DX DG ABDOMEN 1V
2 series · 2 of 2 positions shown · non-contrast
Comparison: None.

CLINICAL DATA: Vomiting for 2 days

EXAM:
ABDOMEN - 1 VIEW

[abdomen kub (1 of 2)]
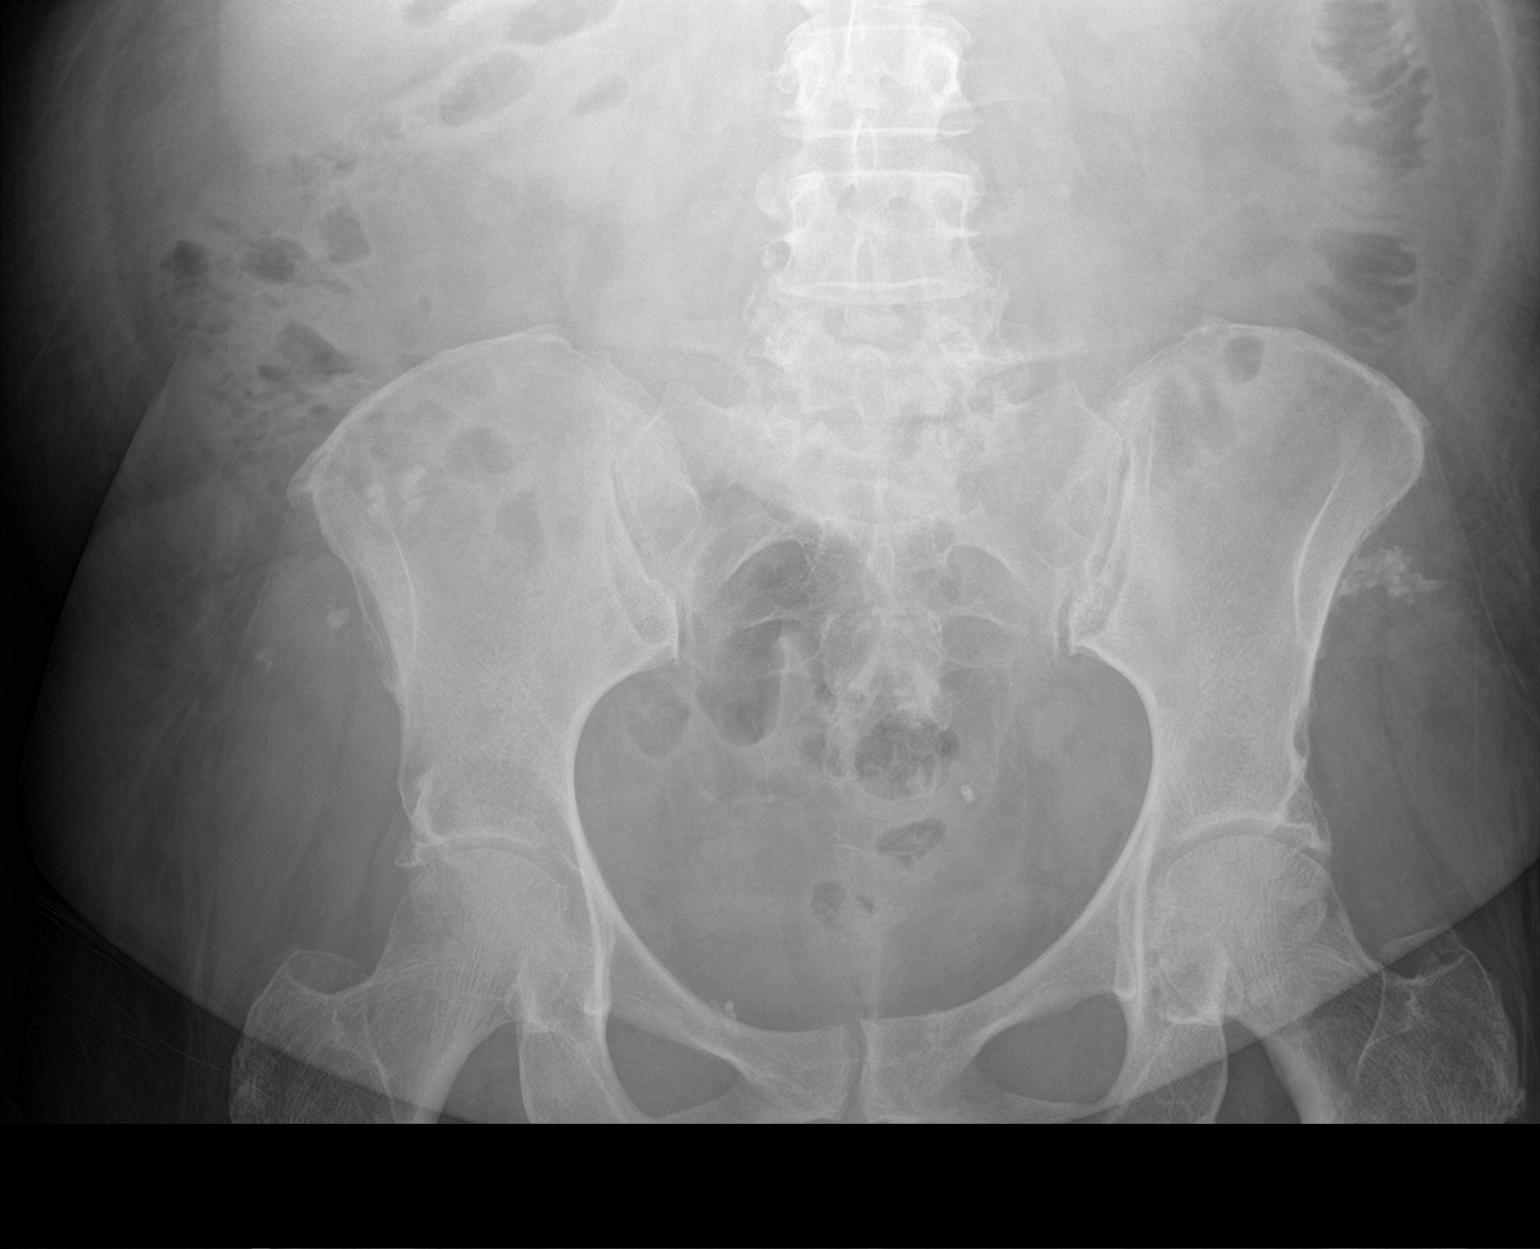

[abdomen kub (2 of 2)]
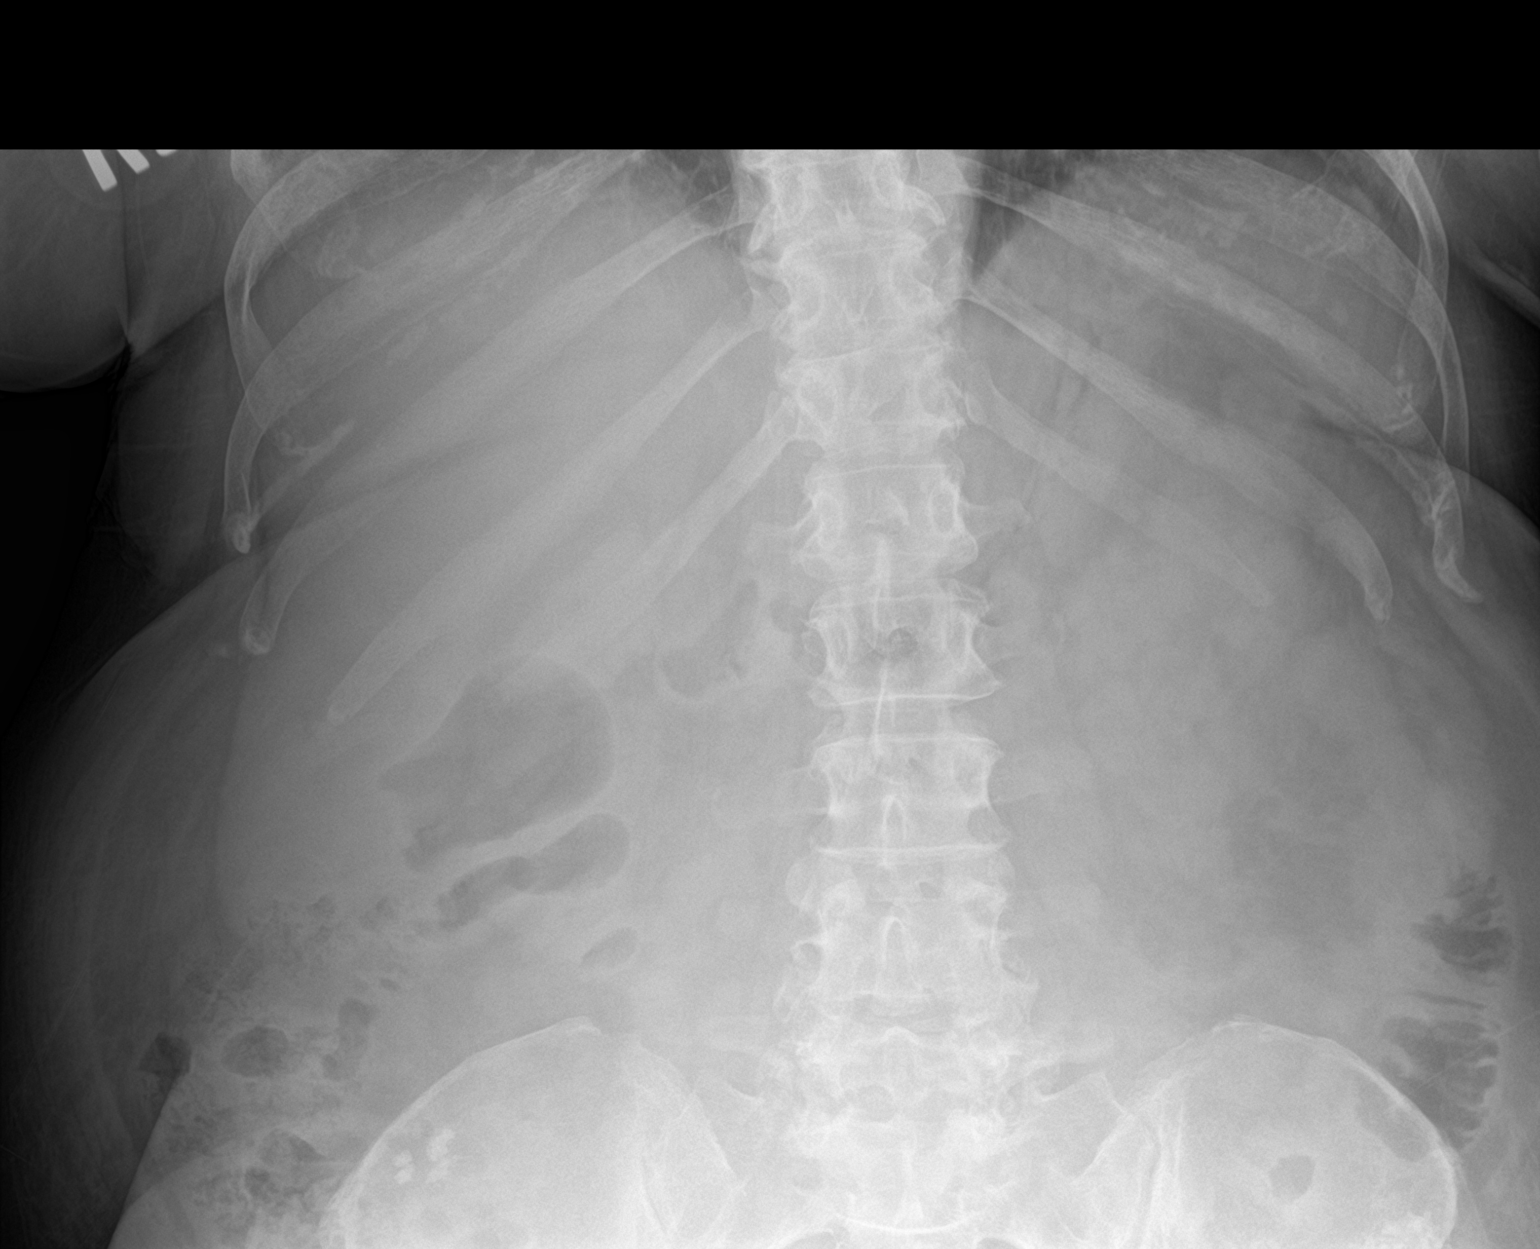

[2 of 2 positions shown; findings below may reference images not displayed]

FINDINGS: There is moderate stool throughout colon. There is no bowel
dilatation or air-fluid level to suggest bowel obstruction. No free
air. There are phleboliths in the pelvis. Soft tissue calcification
is noted in the buttocks regions bilaterally. Visualized lung bases
are clear.
IMPRESSION: No bowel obstruction or free air evident.  Moderate stool in colon.

## 2017-11-18 ENCOUNTER — Other Ambulatory Visit: Payer: Self-pay | Admitting: Podiatry

## 2017-11-18 DIAGNOSIS — B351 Tinea unguium: Secondary | ICD-10-CM

## 2017-12-04 ENCOUNTER — Ambulatory Visit (INDEPENDENT_AMBULATORY_CARE_PROVIDER_SITE_OTHER): Payer: Medicare Other | Admitting: Internal Medicine

## 2017-12-04 ENCOUNTER — Encounter: Payer: Self-pay | Admitting: Internal Medicine

## 2017-12-04 VITALS — BP 172/100 | HR 103 | Ht 67.0 in | Wt 219.0 lb

## 2017-12-04 DIAGNOSIS — E1165 Type 2 diabetes mellitus with hyperglycemia: Secondary | ICD-10-CM

## 2017-12-04 DIAGNOSIS — E785 Hyperlipidemia, unspecified: Secondary | ICD-10-CM

## 2017-12-04 DIAGNOSIS — G63 Polyneuropathy in diseases classified elsewhere: Secondary | ICD-10-CM

## 2017-12-04 DIAGNOSIS — IMO0002 Reserved for concepts with insufficient information to code with codable children: Secondary | ICD-10-CM

## 2017-12-04 DIAGNOSIS — E1140 Type 2 diabetes mellitus with diabetic neuropathy, unspecified: Secondary | ICD-10-CM

## 2017-12-04 LAB — POCT GLYCOSYLATED HEMOGLOBIN (HGB A1C): Hemoglobin A1C: 6.5 % — AB (ref 4.0–5.6)

## 2017-12-04 NOTE — Addendum Note (Signed)
Addended by: Drucilla Schmidt on: 12/04/2017 08:52 AM   Modules accepted: Orders

## 2017-12-04 NOTE — Patient Instructions (Addendum)
Continue off Metformin and Lantus.  Check sugars 1x a day rotating check times.  Please return in 3 months with your sugar log or your meter.
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
## 2018-01-02 ENCOUNTER — Ambulatory Visit: Payer: Medicare Other | Admitting: Internal Medicine

## 2018-02-02 ENCOUNTER — Ambulatory Visit (INDEPENDENT_AMBULATORY_CARE_PROVIDER_SITE_OTHER): Payer: Medicare Other | Admitting: Internal Medicine

## 2018-02-02 ENCOUNTER — Encounter: Payer: Self-pay | Admitting: Internal Medicine

## 2018-02-02 VITALS — BP 132/84 | HR 105 | Ht 67.0 in | Wt 187.0 lb

## 2018-02-02 DIAGNOSIS — Z0001 Encounter for general adult medical examination with abnormal findings: Secondary | ICD-10-CM

## 2018-02-02 DIAGNOSIS — I10 Essential (primary) hypertension: Secondary | ICD-10-CM | POA: Diagnosis not present

## 2018-02-02 DIAGNOSIS — K1379 Other lesions of oral mucosa: Secondary | ICD-10-CM | POA: Diagnosis not present

## 2018-02-02 DIAGNOSIS — E1140 Type 2 diabetes mellitus with diabetic neuropathy, unspecified: Secondary | ICD-10-CM | POA: Diagnosis not present

## 2018-02-02 DIAGNOSIS — R21 Rash and other nonspecific skin eruption: Secondary | ICD-10-CM | POA: Diagnosis not present

## 2018-02-02 DIAGNOSIS — E1165 Type 2 diabetes mellitus with hyperglycemia: Secondary | ICD-10-CM | POA: Diagnosis not present

## 2018-02-02 DIAGNOSIS — IMO0002 Reserved for concepts with insufficient information to code with codable children: Secondary | ICD-10-CM

## 2018-02-02 MED ORDER — NYSTATIN 100000 UNIT/ML MT SUSP: 500000.0000 [IU] | Freq: Four times a day (QID) | OROMUCOSAL | 1 refills | Status: AC

## 2018-02-02 MED ORDER — TRIAMCINOLONE ACETONIDE 0.1 % EX CREA: 1.0000 "application " | TOPICAL_CREAM | Freq: Two times a day (BID) | CUTANEOUS | 1 refills | Status: DC

## 2018-02-02 NOTE — Assessment & Plan Note (Signed)
stable overall by history and exam, recent data reviewed with pt, and pt to continue medical treatment as before,  to f/u any worsening symptoms or concerns  

## 2018-02-02 NOTE — Assessment & Plan Note (Addendum)
I suspect red type thrush - for nystatin soln asd,  to f/u any worsening symptoms or concerns  In addition to the time spent performing CPE, I spent an additional 25 minutes face to face,in which greater than 50% of this time was spent in counseling and coordination of care for patient's acute illness as documented, including the differential dx, treatment, further evaluation and other management of mouth rash and pain, rash, DM and HTN

## 2018-02-02 NOTE — Assessment & Plan Note (Signed)

## 2018-02-02 NOTE — Patient Instructions (Signed)
Please take all new medication as prescribed - the solution for the mouth, as well as the cream for the arm rash  Please continue all other medications as before, and refills have been done if requested.  Please have the pharmacy call with any other refills you may need.  Please continue your efforts at being more active, low cholesterol diet, and weight control.  You are otherwise up to date with prevention measures today.  Please keep your appointments with your specialists as you may have planned - endocrinology  Please go to the LAB in the Basement (turn left off the elevator) for the tests to be done at your convenience  You will be contacted by phone if any changes need to be made immediately.  Otherwise, you will receive a letter about your results with an explanation, but please check with MyChart first.  Please remember to sign up for MyChart if you have not done so, as this will be important to you in the future with finding out test results, communicating by private email, and scheduling acute appointments online when needed.  Please return in 1 year for your yearly visit, or sooner if needed

## 2018-02-02 NOTE — Assessment & Plan Note (Signed)
C/w inflammatory dermatitis, for triam cr prn,  to f/u any worsening symptoms or concerns

## 2018-02-02 NOTE — Progress Notes (Signed)
Subjective:    Patient ID: Gloria [name], female    DOB: 09-22-1950, 67 y.o.   MRN: [id number]  HPI  Here for wellness and f/u;  Overall doing ok;  Pt denies Chest pain, worsening SOB, DOE, wheezing, orthopnea, PND, worsening LE edema, palpitations, dizziness or syncope.  Pt denies neurological change such as new headache, facial or extremity weakness.  Pt denies polydipsia, polyuria, or low sugar symptoms. Pt states overall good compliance with treatment and medications, good tolerability, and has been trying to follow appropriate diet.  Pt denies worsening depressive symptoms, suicidal ideation or panic. No fever, night sweats, wt loss, loss of appetite, or other constitutional symptoms.  Pt states good ability with ADL's, has low fall risk, home safety reviewed and adequate, no other significant changes in hearing or vision, and not active with exercise.   Conts to follow with endo, last a1c 6.5 in June 2019.   Today with mouth discomfort and tongue redness, whole mouth seems to be burning discomfort but no swelling, ulcers or ST.  Started x 2 wks, moderate, constant, nothing makes better or worse Also with new onset itchy rash to the post bilat arms only for unclear reasons, without pain or ulcerations or swelling; there is faint erythema but mostly itchy and numerous lesions .  No obvious contact allergens or yardwork.  Mild to mod, constant, nothing makes better or worse Past Medical History:  Diagnosis Date  . Allergic rhinitis, cause unspecified 03/30/2013  . Anxiety state, unspecified   . Arthritis   . Asthma   . Borderline diabetes mellitus    a1c 7.0 (07/26/13)   . Chronic bronchitis   . Depressive disorder, not elsewhere classified   . Diabetes mellitus without complication ([place])   . Difficulty waking    hard to wake up past sedation!  . Migraine, unspecified, without mention of intractable migraine without mention of status migrainosus   . OSA (obstructive sleep apnea) 12/25/2016  .  Other and unspecified hyperlipidemia   . Panic attacks   . Postmenopausal symptoms   . Unspecified essential hypertension   . Unspecified urinary incontinence   . UTI (lower urinary tract infection)    on 02/05/16/on meds   Past Surgical History:  Procedure Laterality Date  . ABDOMINAL HYSTERECTOMY  2002  . DIAGNOSTIC LAPAROSCOPY    . dislocated shoulder     right  . tumor right ankle      reports that she has never smoked. She has never used smokeless tobacco. She reports that she does not drink alcohol or use drugs. family history includes Arthritis in her unknown relative; Asthma in her son; Colon cancer in her maternal grandfather; Diabetes in her maternal grandmother, maternal uncle, and paternal uncle; Hypertension in her mother and unknown relative; Rheum arthritis in her mother. Allergies  Allergen Reactions  . Compazine Other (See Comments)    Stroke like symptoms  . Haloperidol Decanoate Other (See Comments)    Extrapyramidal syndrome  . Penicillins Nausea And Vomiting  . Glipizide Other (See Comments)    dyspnea  . Metformin And Related Other (See Comments)    GI upset  . Codeine Itching  . Lisinopril Cough   Current Outpatient Medications on File Prior to Visit  Medication Sig Dispense Refill  . ALPRAZolam (XANAX) 1 MG tablet Take 1 mg by mouth 2 (two) times daily as needed for anxiety.     Marland Kitchen glucose blood (ACCU-CHEK AVIVA) test strip Use as instructed four times per day  300 each 11  . Insulin Glargine (LANTUS SOLOSTAR) 100 UNIT/ML Solostar Pen Inject 24 Units into the skin daily. 15 mL 11  . Insulin Pen Needle (BD PEN NEEDLE NANO U/F) 32G X 4 MM MISC USE ONCE DAILY 100 each 3  . irbesartan (AVAPRO) 300 MG tablet Take 1 tablet (300 mg total) by mouth daily. 90 tablet 3  . metFORMIN (GLUCOPHAGE-XR) 500 MG 24 hr tablet Take 1 tablet (500 mg total) by mouth 2 (two) times daily with a meal. 180 tablet 3   No current facility-administered medications on file prior to  visit.    Review of Systems Constitutional: Negative for other unusual diaphoresis, sweats, appetite or weight changes HENT: Negative for other worsening hearing loss, ear pain, facial swelling, mouth sores or neck stiffness.   Eyes: Negative for other worsening pain, redness or other visual disturbance.  Respiratory: Negative for other stridor or swelling Cardiovascular: Negative for other palpitations or other chest pain  Gastrointestinal: Negative for worsening diarrhea or loose stools, blood in stool, distention or other pain Genitourinary: Negative for hematuria, flank pain or other change in urine volume.  Musculoskeletal: Negative for myalgias or other joint swelling.  Skin: Negative for other color change, or other wound or worsening drainage.  Neurological: Negative for other syncope or numbness. Hematological: Negative for other adenopathy or swelling Psychiatric/Behavioral: Negative for hallucinations, other worsening agitation, SI, self-injury, or new decreased concentration All other system neg per pt    Objective:   Physical Exam BP 132/84   Pulse (!) 105   Ht 5\' 7"  (1.702 m)   Wt 187 lb (84.8 kg)   SpO2 96%   BMI 29.29 kg/m  VS noted,  Constitutional: Pt is oriented to person, place, and time. Appears well-developed and well-nourished, in no significant distress and comfortable Head: Normocephalic and atraumatic  Eyes: Conjunctivae and EOM are normal. Pupils are equal, round, and reactive to light Right Ear: External ear normal without discharge Left Ear: External ear normal without discharge Nose: Nose without discharge or deformity Mouth/Throat: Oropharynx is without other ulcerations and moist but with diffuse erythema nontender and tongue redness, no tongue swelling or ulcers Neck: Normal range of motion. Neck supple. No JVD present. No tracheal deviation present or significant neck LA or mass Cardiovascular: Normal rate, regular rhythm, normal heart sounds and  intact distal pulses.   Pulmonary/Chest: WOB normal and breath sounds without rales or wheezing  Abdominal: Soft. Bowel sounds are normal. NT. No HSM  Musculoskeletal: Normal range of motion. Exhibits no edema Lymphadenopathy: Has no other cervical adenopathy.  Neurological: Pt is alert and oriented to person, place, and time. Pt has normal reflexes. No cranial nerve deficit. Motor grossly intact, Gait intact Skin: Skin is warm and dry. + mult sore like rash to upper and lower post arms with excoriations noted, no new ulcerations Psychiatric:  Has mild nervous mood and affect. Behavior is normal without agitation No other exam findings Lab Results  Component Value Date   WBC 11.0 (H) 08/21/2017   HGB 11.4 (L) 08/21/2017   HCT 37.1 08/21/2017   PLT 313 08/21/2017   GLUCOSE 139 (H) 08/21/2017   CHOL 91 02/27/2017   TRIG 93.0 02/27/2017   HDL 26.60 (L) 02/27/2017   LDLDIRECT 64.0 10/03/2015   LDLCALC 45 02/27/2017   ALT 16 05/01/2017   AST 14 05/01/2017   NA 147 (H) 08/21/2017   K 3.4 (L) 08/21/2017   CL 105 08/21/2017   CREATININE 0.69 08/21/2017   BUN  7 08/21/2017   CO2 30 08/21/2017   TSH 3.26 03/27/2016   HGBA1C 6.5 (A) 12/04/2017   MICROALBUR <0.7 10/03/2015   Pt declines further labs today but may return later this wk if orders placed     Assessment & Plan:

## 2018-02-02 NOTE — Assessment & Plan Note (Signed)
stable overall by history and exam, recent data reviewed with pt, and pt to continue medical treatment as before,  to f/u any worsening symptoms or concerns BP Readings from Last 3 Encounters:  02/02/18 132/84  12/04/17 (!) 172/100  08/26/17 (!) 174/86

## 2018-02-18 ENCOUNTER — Telehealth: Payer: Self-pay | Admitting: Internal Medicine

## 2018-02-18 ENCOUNTER — Encounter: Payer: Self-pay | Admitting: Internal Medicine

## 2018-02-18 DIAGNOSIS — S22000D Wedge compression fracture of unspecified thoracic vertebra, subsequent encounter for fracture with routine healing: Secondary | ICD-10-CM

## 2018-02-18 NOTE — Addendum Note (Signed)
Addended by: Biagio Borg on: 02/18/2018 07:46 PM   Modules accepted: Orders

## 2018-02-18 NOTE — Progress Notes (Unsigned)
w

## 2018-02-18 NOTE — Telephone Encounter (Addendum)
Ok for this -   I will order dxa  Please contact pt to have this scheduled

## 2018-02-18 NOTE — Telephone Encounter (Signed)
See below
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
## 2018-02-19 NOTE — Telephone Encounter (Signed)
Pt stated she did not have a fracture, and I offered to schedule her anyway and she did not want to at this time.

## 2018-02-19 NOTE — Telephone Encounter (Signed)
Please contact pt to schedule Dexa

## 2018-02-20 NOTE — Telephone Encounter (Signed)
Noted  

## 2018-02-25 ENCOUNTER — Encounter: Payer: Self-pay | Admitting: Internal Medicine

## 2018-02-25 ENCOUNTER — Ambulatory Visit (INDEPENDENT_AMBULATORY_CARE_PROVIDER_SITE_OTHER): Payer: Medicare Other | Admitting: Internal Medicine

## 2018-02-25 ENCOUNTER — Ambulatory Visit (INDEPENDENT_AMBULATORY_CARE_PROVIDER_SITE_OTHER)
Admission: RE | Admit: 2018-02-25 | Discharge: 2018-02-25 | Disposition: A | Discharge status: Home / Self Care | Payer: Medicare Other | Source: Ambulatory Visit | Attending: Internal Medicine | Admitting: Internal Medicine

## 2018-02-25 ENCOUNTER — Telehealth: Payer: Self-pay | Admitting: Internal Medicine

## 2018-02-25 ENCOUNTER — Other Ambulatory Visit (INDEPENDENT_AMBULATORY_CARE_PROVIDER_SITE_OTHER): Payer: Medicare Other

## 2018-02-25 VITALS — BP 134/80 | HR 106 | Temp 97.9°F | Ht 67.0 in | Wt 186.0 lb

## 2018-02-25 DIAGNOSIS — R21 Rash and other nonspecific skin eruption: Secondary | ICD-10-CM

## 2018-02-25 DIAGNOSIS — I10 Essential (primary) hypertension: Secondary | ICD-10-CM

## 2018-02-25 DIAGNOSIS — R1907 Generalized intra-abdominal and pelvic swelling, mass and lump: Secondary | ICD-10-CM

## 2018-02-25 DIAGNOSIS — E1165 Type 2 diabetes mellitus with hyperglycemia: Secondary | ICD-10-CM

## 2018-02-25 DIAGNOSIS — R0602 Shortness of breath: Secondary | ICD-10-CM | POA: Diagnosis not present

## 2018-02-25 DIAGNOSIS — E1140 Type 2 diabetes mellitus with diabetic neuropathy, unspecified: Secondary | ICD-10-CM

## 2018-02-25 DIAGNOSIS — IMO0002 Reserved for concepts with insufficient information to code with codable children: Secondary | ICD-10-CM

## 2018-02-25 DIAGNOSIS — K1379 Other lesions of oral mucosa: Secondary | ICD-10-CM

## 2018-02-25 LAB — URINALYSIS, ROUTINE W REFLEX MICROSCOPIC
Bilirubin Urine: NEGATIVE
Hgb urine dipstick: NEGATIVE
Ketones, ur: NEGATIVE
Leukocytes, UA: NEGATIVE
Nitrite: NEGATIVE
RBC / HPF: NONE SEEN (ref 0–?)
Specific Gravity, Urine: <=1.005 — AB (ref 1.000–1.030)
Urine Glucose: NEGATIVE
Urobilinogen, UA: 0.2 (ref 0.0–1.0)
pH: 7 (ref 5.0–8.0)

## 2018-02-25 LAB — CBC WITH DIFFERENTIAL/PLATELET
Basophils Absolute: 0.1 10*3/uL (ref 0.0–0.1)
Basophils Relative: 1 % (ref 0.0–3.0)
Eosinophils Absolute: 0.1 10*3/uL (ref 0.0–0.7)
Eosinophils Relative: 1.5 % (ref 0.0–5.0)
HCT: 33.9 % — ABNORMAL LOW (ref 36.0–46.0)
Hemoglobin: 11.3 g/dL — ABNORMAL LOW (ref 12.0–15.0)
Lymphocytes Relative: 6.2 % — ABNORMAL LOW (ref 12.0–46.0)
Lymphs Abs: 0.6 10*3/uL — ABNORMAL LOW (ref 0.7–4.0)
MCHC: 33.4 g/dL (ref 30.0–36.0)
MCV: 90.1 fl (ref 78.0–100.0)
Monocytes Absolute: 0.4 10*3/uL (ref 0.1–1.0)
Monocytes Relative: 3.7 % (ref 3.0–12.0)
Neutro Abs: 8.4 10*3/uL — ABNORMAL HIGH (ref 1.4–7.7)
Neutrophils Relative %: 87.6 % — ABNORMAL HIGH (ref 43.0–77.0)
Platelets: 354 10*3/uL (ref 150.0–400.0)
RBC: 3.76 Mil/uL — ABNORMAL LOW (ref 3.87–5.11)
RDW: 14.5 % (ref 11.5–15.5)
WBC: 9.6 10*3/uL (ref 4.0–10.5)

## 2018-02-25 LAB — LIPID PANEL
Cholesterol: 86 mg/dL (ref 0–200)
HDL: 24 mg/dL — ABNORMAL LOW (ref >39.00)
LDL Cholesterol: 43 mg/dL (ref 0–99)
NonHDL: 62.44
Total CHOL/HDL Ratio: 4
Triglycerides: 96 mg/dL (ref 0.0–149.0)
VLDL: 19.2 mg/dL (ref 0.0–40.0)

## 2018-02-25 LAB — HEPATIC FUNCTION PANEL
ALT: 7 U/L (ref 0–35)
AST: 18 U/L (ref 0–37)
Albumin: 3.6 g/dL (ref 3.5–5.2)
Alkaline Phosphatase: 110 U/L (ref 39–117)
Bilirubin, Direct: 0.6 mg/dL — ABNORMAL HIGH (ref 0.0–0.3)
Total Bilirubin: 1.6 mg/dL — ABNORMAL HIGH (ref 0.2–1.2)
Total Protein: 8.6 g/dL — ABNORMAL HIGH (ref 6.0–8.3)

## 2018-02-25 LAB — BASIC METABOLIC PANEL
BUN: 4 mg/dL — ABNORMAL LOW (ref 6–23)
CO2: 31 mEq/L (ref 19–32)
Calcium: 9.5 mg/dL (ref 8.4–10.5)
Chloride: 95 mEq/L — ABNORMAL LOW (ref 96–112)
Creatinine, Ser: 0.73 mg/dL (ref 0.40–1.20)
GFR: 102.06 mL/min (ref >60.00)
Glucose, Bld: 130 mg/dL — ABNORMAL HIGH (ref 70–99)
Potassium: 3.2 mEq/L — ABNORMAL LOW (ref 3.5–5.1)
Sodium: 137 mEq/L (ref 135–145)

## 2018-02-25 LAB — MICROALBUMIN / CREATININE URINE RATIO
Creatinine,U: 45.8 mg/dL
Microalb Creat Ratio: 13.9 mg/g (ref 0.0–30.0)
Microalb, Ur: 6.4 mg/dL — ABNORMAL HIGH (ref 0.0–1.9)

## 2018-02-25 LAB — HEMOGLOBIN A1C: Hgb A1c MFr Bld: 6 % (ref 4.6–6.5)

## 2018-02-25 LAB — TSH: TSH: 4.24 u[IU]/mL (ref 0.35–4.50)

## 2018-02-25 MED ORDER — TRIAMCINOLONE ACETONIDE 0.1 % EX CREA: 1.0000 "application " | TOPICAL_CREAM | Freq: Two times a day (BID) | CUTANEOUS | 1 refills | Status: DC

## 2018-02-25 MED ORDER — FUROSEMIDE 20 MG PO TABS: 20.0000 mg | ORAL_TABLET | Freq: Every day | ORAL | 3 refills | Status: DC

## 2018-02-25 MED ORDER — MAGIC MOUTHWASH: 5.0000 mL | Freq: Three times a day (TID) | ORAL | 0 refills | Status: DC | PRN

## 2018-02-25 NOTE — Telephone Encounter (Signed)
How about "Talya's magic mouthwash"  - I think that may be ok

## 2018-02-25 NOTE — Assessment & Plan Note (Signed)
stable overall by history and exam, recent data reviewed with pt, and pt to continue medical treatment as before,  to f/u any worsening symptoms or concerns Lab Results  Component Value Date   HGBA1C 6.0 02/25/2018

## 2018-02-25 NOTE — Telephone Encounter (Signed)
Copied from [place] [id number]. Topic: Quick Communication - See Telephone Encounter >> Feb 25, 2018  3:27 PM Mylinda Latina, NT wrote: CRM for notification. See Telephone encounter for: 02/25/18. Roderic Palau calling from [place] and states he needs clarification on the formulary of the magic mouthwash SOLN  CB# [phone]

## 2018-02-25 NOTE — Patient Instructions (Signed)
Please take all new medication as prescribed - the steroid cream for the arm rash, and magic mouthwash for the mouth  Please continue all other medications as before, and refills have been done if requested.  Please have the pharmacy call with any other refills you may need.  Please keep your appointments with your specialists as you may have planned  You will be contacted regarding the referral for: Abdomen ultrasound (asap)  Please go to the XRAY Department in the Basement (go straight as you get off the elevator) for the x-ray testing  Please go to the LAB in the Basement (turn left off the elevator) for the tests to be done today  You will be contacted by phone if any changes need to be made immediately.  Otherwise, you will receive a letter about your results with an explanation, but please check with MyChart first.  Please remember to sign up for MyChart if you have not done so, as this will be important to you in the future with finding out test results, communicating by private email, and scheduling acute appointments online when needed.  Please return in 1 week, or sooner if needed

## 2018-02-25 NOTE — Assessment & Plan Note (Signed)
Etiology unclear, ok for magic mouthwash asd

## 2018-02-25 NOTE — Assessment & Plan Note (Signed)
C/w dermatitis, for triam cr prn 

## 2018-02-25 NOTE — Progress Notes (Signed)
Subjective:    Patient ID: Gloria [name], female    DOB: December 14, 1950, 67 y.o.   MRN: [id number]  HPI  Here with c/o 2 wks onset unusual abd swelling without fever or pain but also with bilat LE swelling and worsening dyspnea with hard to take deep breaths worse with sitting, some better to ambulate. Also with increased itching with picking at arms and unusual itchty whitish kind of lesions without pus or drainage.  Also with unusual mouth pain not better with nystatin in past.  Pt denies chest pain, wheezing, orthopnea, PND, palpitations, dizziness or syncope. Wt Readings from Last 3 Encounters:  02/25/18 186 lb (84.4 kg)  02/02/18 187 lb (84.8 kg)  12/04/17 219 lb (99.3 kg)   Past Medical History:  Diagnosis Date  . Allergic rhinitis, cause unspecified 03/30/2013  . Anxiety state, unspecified   . Arthritis   . Asthma   . Borderline diabetes mellitus    a1c 7.0 (07/26/13)   . Chronic bronchitis   . Depressive disorder, not elsewhere classified   . Diabetes mellitus without complication ([place])   . Difficulty waking    hard to wake up past sedation!  . Migraine, unspecified, without mention of intractable migraine without mention of status migrainosus   . OSA (obstructive sleep apnea) 12/25/2016  . Other and unspecified hyperlipidemia   . Panic attacks   . Postmenopausal symptoms   . Unspecified essential hypertension   . Unspecified urinary incontinence   . UTI (lower urinary tract infection)    on 02/05/16/on meds   Past Surgical History:  Procedure Laterality Date  . ABDOMINAL HYSTERECTOMY  2002  . DIAGNOSTIC LAPAROSCOPY    . dislocated shoulder     right  . tumor right ankle      reports that she has never smoked. She has never used smokeless tobacco. She reports that she does not drink alcohol or use drugs. family history includes Arthritis in her unknown relative; Asthma in her son; Colon cancer in her maternal grandfather; Diabetes in her maternal grandmother, maternal  uncle, and paternal uncle; Hypertension in her mother and unknown relative; Rheum arthritis in her mother. Allergies  Allergen Reactions  . Compazine Other (See Comments)    Stroke like symptoms  . Haloperidol Decanoate Other (See Comments)    Extrapyramidal syndrome  . Penicillins Nausea And Vomiting  . Glipizide Other (See Comments)    dyspnea  . Metformin And Related Other (See Comments)    GI upset  . Codeine Itching  . Lisinopril Cough   Current Outpatient Medications on File Prior to Visit  Medication Sig Dispense Refill  . ALPRAZolam (XANAX) 1 MG tablet Take 1 mg by mouth 2 (two) times daily as needed for anxiety.     Marland Kitchen glucose blood (ACCU-CHEK AVIVA) test strip Use as instructed four times per day 300 each 11  . Insulin Glargine (LANTUS SOLOSTAR) 100 UNIT/ML Solostar Pen Inject 24 Units into the skin daily. 15 mL 11  . Insulin Pen Needle (BD PEN NEEDLE NANO U/F) 32G X 4 MM MISC USE ONCE DAILY 100 each 3  . irbesartan (AVAPRO) 300 MG tablet Take 1 tablet (300 mg total) by mouth daily. 90 tablet 3  . metFORMIN (GLUCOPHAGE-XR) 500 MG 24 hr tablet Take 1 tablet (500 mg total) by mouth 2 (two) times daily with a meal. 180 tablet 3   No current facility-administered medications on file prior to visit.    Review of Systems  Constitutional: Negative for other  unusual diaphoresis or sweats HENT: Negative for ear discharge or swelling Eyes: Negative for other worsening visual disturbances Respiratory: Negative for stridor or other swelling  Gastrointestinal: Negative for worsening distension or other blood Genitourinary: Negative for retention or other urinary change Musculoskeletal: Negative for other MSK pain or swelling Skin: Negative for color change or other new lesions Neurological: Negative for worsening tremors and other numbness  Psychiatric/Behavioral: Negative for worsening agitation or other fatigue All other system neg per pt    Objective:   Physical Exam  BP  134/80   Pulse (!) 106   Temp 97.9 F (36.6 C) (Oral)   Ht 5\' 7"  (1.702 m)   Wt 186 lb (84.4 kg)   SpO2 94%   BMI 29.13 kg/m  VS noted,  Constitutional: Pt appears in NAD HENT: Head: NCAT.  Right Ear: External ear normal.  Left Ear: External ear normal.  Eyes: . Pupils are equal, round, and reactive to light. Conjunctivae and EOM are normal Skin with 5 mm whitish nontender somewhat scaly rash to lateral arms only Mouth without ulcer or significant erythema Nose: without d/c or deformity Neck: Neck supple. Gross normal ROM Cardiovascular: Normal rate and regular rhythm.   Pulmonary/Chest: Effort normal and breath sounds decreased without rales or wheezing.  Abd:  Soft, NT,  + BS, no organomegaly with moderate non tense distension non tender Neurological: Pt is alert. At baseline orientation, motor grossly intact Skin: Skin is warm. No other new lesions, no LE edema Psychiatric: Pt behavior is normal without agitation  No other exam findings   Lab Results  Component Value Date   WBC 11.0 (H) 08/21/2017   HGB 11.4 (L) 08/21/2017   HCT 37.1 08/21/2017   PLT 313 08/21/2017   GLUCOSE 139 (H) 08/21/2017   CHOL 91 02/27/2017   TRIG 93.0 02/27/2017   HDL 26.60 (L) 02/27/2017   LDLDIRECT 64.0 10/03/2015   LDLCALC 45 02/27/2017   ALT 16 05/01/2017   AST 14 05/01/2017   NA 147 (H) 08/21/2017   K 3.4 (L) 08/21/2017   CL 105 08/21/2017   CREATININE 0.69 08/21/2017   BUN 7 08/21/2017   CO2 30 08/21/2017   TSH 3.26 03/27/2016   HGBA1C 6.5 (A) 12/04/2017   MICROALBUR <0.7 10/03/2015    CT Chest W Contrast - summary only 08/2017 IMPRESSION: 1. Left second anterior rib acute mildly displaced fracture. No pneumothorax. 2. T12 vertebral body mild anterior compression deformity, age indeterminate. 3. Motion artifact through sternum, no definite fracture. 4. Cardiomegaly, small right effusion, and mild pulmonary interstitial edema. 5. Mild calcific atherosclerosis of thoracic  aorta. No acute aortic injury. 6. Left thyroid 21 mm nodule. Thyroid ultrasound is recommended on a nonemergent basis. 7. Low-attenuation small volume perihepatic fluid, probably ascites.   Electronically Signed   By: Kristine Garbe M.D.   On: 08/21/2017 18:24  Transthoracic Echocardiography - summary  Patient:    Jeni, Duling MR #:       [id number] Study Date: 03/21/2016  ------------------------------------------------------------------- LV EF: 50% -   55%  ------------------------------------------------------------------- Indications:      CHF (I50.31).  ------------------------------------------------------------------- History:   PMH:  Edema, Asthma.  Congestive heart failure.  Risk factors:  Hypertension. Diabetes mellitus. Dyslipidemia.  ------------------------------------------------------------------- Study Conclusions  - Left ventricle: Global longitudinal strain is -14.3% The cavity   size was mildly dilated. Wall thickness was normal. Systolic   function was normal. The estimated ejection fraction was in the   range of 50%. Features are consistent  with a pseudonormal left   ventricular filling pattern, with concomitant abnormal relaxation   and increased filling pressure (grade 2 diastolic dysfunction). - Mitral valve: There was mild regurgitation. - Left atrium: The atrium was mildly dilated.  Impressions:  - Would recomm limited echo with Definity use to further evaluate   regional wall motion.  ------------------------------------------------------------------- Study data:  No prior study was available for comparison.  Study status:  Routine.  Procedure:  Transthoracic echocardiography. Image quality was adequate.          Transthoracic echocardiography.  M-mode, complete 2D, spectral Doppler, and color Doppler.  Birthdate:  Patient birthdate: 10/13/1950.  Age:  Patient is 67 yr old.  Sex:  Gender: female.    BMI: 32.7 kg/m^2.   Blood pressure:     140/80  Patient status:  Outpatient.  Study date: Study date: 03/21/2016. Study time: 08:07 AM.  Location:  [facility]  -------------------------------------------------------------------  ------------------------------------------------------------------- Left ventricle:  Global longitudinal strain is -14.3% The cavity size was mildly dilated. Wall thickness was normal. Systolic function was normal. The estimated ejection fraction was in the range of 50%. Features are consistent with a pseudonormal left ventricular filling pattern, with concomitant abnormal relaxation and increased filling pressure (grade 2 diastolic dysfunction).  ------------------------------------------------------------------- Aortic valve:   Structurally normal valve.   Cusp separation was normal.  Doppler:  Transvalvular velocity was within the normal range. There was no stenosis. There was no regurgitation.  ------------------------------------------------------------------- Aorta:  The aorta was poorly visualized, normal, not dilated, and non-diseased.  ------------------------------------------------------------------- Mitral valve:   Structurally normal valve.   Leaflet separation was normal.  Doppler:  Transvalvular velocity was within the normal range. There was no evidence for stenosis. There was mild regurgitation.    Peak gradient (D): 5 mm Hg.  ------------------------------------------------------------------- Left atrium:  The atrium was mildly dilated.  ------------------------------------------------------------------- Right ventricle:  The cavity size was normal. Wall thickness was normal. Systolic function was normal.  ------------------------------------------------------------------- Pulmonic valve:    Structurally normal valve.   Cusp separation was normal.  Doppler:  Transvalvular velocity was within the normal range. There was no  regurgitation.  ------------------------------------------------------------------- Tricuspid valve:   Structurally normal valve.   Leaflet separation was normal.  Doppler:  Transvalvular velocity was within the normal range. There was mild regurgitation.  ------------------------------------------------------------------- Right atrium:  The atrium was normal in size.  ------------------------------------------------------------------- Pericardium:  There was no pericardial effusion.  ------------------------------------------------------------------- Systemic veins: Inferior vena cava: The vessel was normal in size. The respirophasic diameter changes were in the normal range (= 50%), consistent with normal central venous pressure. Diameter: 19.9 mm.  ------------------------------------------------------------------- Post procedure conclusions Ascending Aorta:  - The aorta was poorly visualized, normal, not dilated, and   non-diseased.  ------------------------------------------------------------------- Measurements   IVC                                   Value        Reference  ID                                    19.9  mm     ---------    Left ventricle                        Value  Reference  LV ID, ED, PLAX chordal        (H)    53.3  mm     43 - 52  LV ID, ES, PLAX chordal        (H)    41.1  mm     23 - 38  LV fx shortening, PLAX chordal (L)    23    %      >=29  LV PW thickness, ED                   10.4  mm     ---------  IVS/LV PW ratio, ED                   0.93         <=1.3  Stroke volume, 2D                     72    ml     ---------  Stroke volume/bsa, 2D                 33    ml/m^2 ---------  LV e&', lateral                        6.96  cm/s   ---------  LV E/e&', lateral                      15.8         ---------  LV e&', medial                         6.2   cm/s   ---------  LV E/e&', medial                       17.74        ---------   LV e&', average                        6.58  cm/s   ---------  LV E/e&', average                      16.72        ---------  Longitudinal strain, TDI              14    %      ---------    Ventricular septum                    Value        Reference  IVS thickness, ED                     9.72  mm     ---------    LVOT                                  Value        Reference  LVOT ID, S                            23    mm     ---------  LVOT area  4.15  cm^2   ---------  LVOT peak velocity, S                 82    cm/s   ---------  LVOT mean velocity, S                 56.5  cm/s   ---------  LVOT VTI, S                           17.4  cm     ---------    Aorta                                 Value        Reference  Aortic root ID, ED                    31    mm     ---------  Ascending aorta ID, A-P, S            31    mm     ---------    Left atrium                           Value        Reference  LA ID, A-P, ES                        38    mm     ---------  LA ID/bsa, A-P                        1.77  cm/m^2 <=2.2  LA volume, S                          60.3  ml     ---------  LA volume/bsa, S                      28    ml/m^2 ---------  LA volume, ES, 1-p A4C                47.8  ml     ---------  LA volume/bsa, ES, 1-p A4C            22.2  ml/m^2 ---------  LA volume, ES, 1-p A2C                74.7  ml     ---------  LA volume/bsa, ES, 1-p A2C            34.7  ml/m^2 ---------    Mitral valve                          Value        Reference  Mitral E-wave peak velocity           110   cm/s   ---------  Mitral A-wave peak velocity           90.6  cm/s   ---------  Mitral deceleration time              166   ms     150 - 230  Mitral peak gradient, D  5     mm Hg  ---------  Mitral E/A ratio, peak                1.2          ---------    Tricuspid valve                       Value        Reference  Tricuspid regurg peak velocity        259    cm/s   ---------  Tricuspid peak RV-RA gradient         27    mm Hg  ---------    Right ventricle                       Value        Reference  TAPSE                                 21.1  mm     ---------  RV s&', lateral, S                     14.3  cm/s   ---------  Legend: (L)  and  (H)  mark values outside specified reference range.  ------------------------------------------------------------------- Prepared and Electronically Authenticated by  Dorris Carnes, M.D. 2017-09-22T08:[id number]:46  MERGE Images   Show images for Echocardiogram      Assessment & Plan:

## 2018-02-25 NOTE — Assessment & Plan Note (Addendum)
Suspect ascites new onset, etiology unclear, no ETOH per pt or other known liver dz, for abd u/s, consider futher evaluation pending results  Note:  Total time for pt hx, exam, review of record with pt in the room, determination of diagnoses and plan for further eval and tx is > 40 min, with over 50% spent in coordination and counseling of patient including the differential dx, tx, further evaluation and other management of abd swelling, rash, mouth pain, HTN, DM

## 2018-02-25 NOTE — Telephone Encounter (Signed)
Pharmacy is calling for formulation to be used for magic mouth wash- please contact them with details.  Roderic Palau [phone]

## 2018-02-26 ENCOUNTER — Encounter: Payer: Self-pay | Admitting: Internal Medicine

## 2018-02-26 NOTE — Telephone Encounter (Signed)
Can you please clarify?

## 2018-02-26 NOTE — Telephone Encounter (Signed)
Medical screening examination/treatment/procedure(s) were performed by non-physician practitioner and as supervising physician I was immediately available for consultation/collaboration. I agree with above. James John, MD   

## 2018-03-05 ENCOUNTER — Encounter: Payer: Self-pay | Admitting: Internal Medicine

## 2018-03-05 ENCOUNTER — Other Ambulatory Visit: Payer: Self-pay | Admitting: Internal Medicine

## 2018-03-05 ENCOUNTER — Ambulatory Visit: Payer: Medicare Other | Admitting: Internal Medicine

## 2018-03-05 ENCOUNTER — Ambulatory Visit
Admission: RE | Admit: 2018-03-05 | Discharge: 2018-03-05 | Disposition: A | Discharge status: Home / Self Care | Payer: Medicare Other | Source: Ambulatory Visit | Attending: Internal Medicine | Admitting: Internal Medicine

## 2018-03-05 ENCOUNTER — Telehealth: Payer: Self-pay

## 2018-03-05 DIAGNOSIS — R188 Other ascites: Secondary | ICD-10-CM | POA: Insufficient documentation

## 2018-03-05 DIAGNOSIS — R1907 Generalized intra-abdominal and pelvic swelling, mass and lump: Secondary | ICD-10-CM

## 2018-03-05 DIAGNOSIS — E1165 Type 2 diabetes mellitus with hyperglycemia: Secondary | ICD-10-CM

## 2018-03-05 DIAGNOSIS — K1379 Other lesions of oral mucosa: Secondary | ICD-10-CM

## 2018-03-05 DIAGNOSIS — IMO0002 Reserved for concepts with insufficient information to code with codable children: Secondary | ICD-10-CM

## 2018-03-05 DIAGNOSIS — R1900 Intra-abdominal and pelvic swelling, mass and lump, unspecified site: Secondary | ICD-10-CM | POA: Diagnosis not present

## 2018-03-05 DIAGNOSIS — R21 Rash and other nonspecific skin eruption: Secondary | ICD-10-CM

## 2018-03-05 DIAGNOSIS — E1140 Type 2 diabetes mellitus with diabetic neuropathy, unspecified: Secondary | ICD-10-CM

## 2018-03-05 NOTE — Telephone Encounter (Signed)
Called pt, LVM.   CRM created.  

## 2018-03-05 NOTE — Telephone Encounter (Signed)
-----   Message from Biagio Borg, MD sent at 03/05/2018  3:31 PM EDT ----- Letter sent, cont same tx except  The test results show that your current treatment is OK., except the ultrasound does confirm what we suspected, that you have some fluid in the abdominal area.  We need to refer you to Gastroenterology for further consideration.  Hopefully, you should hear soon   Shirron to please inform pt, I will do referral

## 2018-03-31 ENCOUNTER — Ambulatory Visit: Payer: Medicare Other | Admitting: Internal Medicine

## 2018-03-31 DIAGNOSIS — Z0289 Encounter for other administrative examinations: Secondary | ICD-10-CM
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
## 2018-04-21 ENCOUNTER — Encounter: Payer: Self-pay | Admitting: Internal Medicine

## 2018-05-01 ENCOUNTER — Emergency Department (HOSPITAL_COMMUNITY): Payer: Medicare Other

## 2018-05-01 ENCOUNTER — Inpatient Hospital Stay (HOSPITAL_COMMUNITY)
Admission: EM | Admit: 2018-05-01 | Discharge: 2018-05-11 | DRG: 286 | Disposition: A | Discharge status: Home / Self Care | Payer: Medicare Other | Attending: Internal Medicine | Admitting: Internal Medicine

## 2018-05-01 ENCOUNTER — Ambulatory Visit: Payer: Self-pay | Admitting: *Deleted

## 2018-05-01 ENCOUNTER — Encounter (HOSPITAL_COMMUNITY): Payer: Self-pay | Admitting: Emergency Medicine

## 2018-05-01 ENCOUNTER — Inpatient Hospital Stay (HOSPITAL_COMMUNITY): Payer: Medicare Other

## 2018-05-01 ENCOUNTER — Other Ambulatory Visit: Payer: Self-pay

## 2018-05-01 DIAGNOSIS — I5021 Acute systolic (congestive) heart failure: Secondary | ICD-10-CM | POA: Diagnosis not present

## 2018-05-01 DIAGNOSIS — I5043 Acute on chronic combined systolic (congestive) and diastolic (congestive) heart failure: Secondary | ICD-10-CM | POA: Diagnosis present

## 2018-05-01 DIAGNOSIS — I400 Infective myocarditis: Secondary | ICD-10-CM | POA: Diagnosis present

## 2018-05-01 DIAGNOSIS — D649 Anemia, unspecified: Secondary | ICD-10-CM | POA: Diagnosis present

## 2018-05-01 DIAGNOSIS — K7460 Unspecified cirrhosis of liver: Secondary | ICD-10-CM

## 2018-05-01 DIAGNOSIS — E1142 Type 2 diabetes mellitus with diabetic polyneuropathy: Secondary | ICD-10-CM | POA: Diagnosis present

## 2018-05-01 DIAGNOSIS — R21 Rash and other nonspecific skin eruption: Secondary | ICD-10-CM | POA: Diagnosis not present

## 2018-05-01 DIAGNOSIS — Z9119 Patient's noncompliance with other medical treatment and regimen: Secondary | ICD-10-CM

## 2018-05-01 DIAGNOSIS — R079 Chest pain, unspecified: Secondary | ICD-10-CM | POA: Diagnosis not present

## 2018-05-01 DIAGNOSIS — R188 Other ascites: Secondary | ICD-10-CM

## 2018-05-01 DIAGNOSIS — I50810 Right heart failure, unspecified: Secondary | ICD-10-CM | POA: Diagnosis present

## 2018-05-01 DIAGNOSIS — Z794 Long term (current) use of insulin: Secondary | ICD-10-CM

## 2018-05-01 DIAGNOSIS — E11649 Type 2 diabetes mellitus with hypoglycemia without coma: Secondary | ICD-10-CM | POA: Diagnosis present

## 2018-05-01 DIAGNOSIS — K761 Chronic passive congestion of liver: Secondary | ICD-10-CM | POA: Diagnosis present

## 2018-05-01 DIAGNOSIS — R634 Abnormal weight loss: Secondary | ICD-10-CM | POA: Diagnosis present

## 2018-05-01 DIAGNOSIS — R5381 Other malaise: Secondary | ICD-10-CM | POA: Diagnosis present

## 2018-05-01 DIAGNOSIS — E1165 Type 2 diabetes mellitus with hyperglycemia: Secondary | ICD-10-CM | POA: Diagnosis not present

## 2018-05-01 DIAGNOSIS — R140 Abdominal distension (gaseous): Secondary | ICD-10-CM | POA: Diagnosis not present

## 2018-05-01 DIAGNOSIS — Z8249 Family history of ischemic heart disease and other diseases of the circulatory system: Secondary | ICD-10-CM | POA: Diagnosis not present

## 2018-05-01 DIAGNOSIS — E876 Hypokalemia: Secondary | ICD-10-CM | POA: Diagnosis present

## 2018-05-01 DIAGNOSIS — I10 Essential (primary) hypertension: Secondary | ICD-10-CM | POA: Diagnosis not present

## 2018-05-01 DIAGNOSIS — R601 Generalized edema: Secondary | ICD-10-CM

## 2018-05-01 DIAGNOSIS — E785 Hyperlipidemia, unspecified: Secondary | ICD-10-CM | POA: Diagnosis present

## 2018-05-01 DIAGNOSIS — I110 Hypertensive heart disease with heart failure: Principal | ICD-10-CM | POA: Diagnosis present

## 2018-05-01 DIAGNOSIS — E7220 Disorder of urea cycle metabolism, unspecified: Secondary | ICD-10-CM | POA: Diagnosis present

## 2018-05-01 DIAGNOSIS — Z833 Family history of diabetes mellitus: Secondary | ICD-10-CM | POA: Diagnosis not present

## 2018-05-01 DIAGNOSIS — Z825 Family history of asthma and other chronic lower respiratory diseases: Secondary | ICD-10-CM | POA: Diagnosis not present

## 2018-05-01 DIAGNOSIS — J811 Chronic pulmonary edema: Secondary | ICD-10-CM | POA: Diagnosis not present

## 2018-05-01 DIAGNOSIS — E1140 Type 2 diabetes mellitus with diabetic neuropathy, unspecified: Secondary | ICD-10-CM | POA: Diagnosis present

## 2018-05-01 DIAGNOSIS — L299 Pruritus, unspecified: Secondary | ICD-10-CM | POA: Diagnosis present

## 2018-05-01 DIAGNOSIS — E8770 Fluid overload, unspecified: Secondary | ICD-10-CM | POA: Insufficient documentation

## 2018-05-01 DIAGNOSIS — K8020 Calculus of gallbladder without cholecystitis without obstruction: Secondary | ICD-10-CM | POA: Diagnosis not present

## 2018-05-01 DIAGNOSIS — I272 Pulmonary hypertension, unspecified: Secondary | ICD-10-CM | POA: Diagnosis present

## 2018-05-01 DIAGNOSIS — J45909 Unspecified asthma, uncomplicated: Secondary | ICD-10-CM | POA: Diagnosis present

## 2018-05-01 DIAGNOSIS — E871 Hypo-osmolality and hyponatremia: Secondary | ICD-10-CM | POA: Diagnosis present

## 2018-05-01 DIAGNOSIS — I5023 Acute on chronic systolic (congestive) heart failure: Secondary | ICD-10-CM | POA: Diagnosis not present

## 2018-05-01 DIAGNOSIS — I428 Other cardiomyopathies: Secondary | ICD-10-CM | POA: Diagnosis present

## 2018-05-01 DIAGNOSIS — G4733 Obstructive sleep apnea (adult) (pediatric): Secondary | ICD-10-CM | POA: Diagnosis present

## 2018-05-01 DIAGNOSIS — Z6823 Body mass index (BMI) 23.0-23.9, adult: Secondary | ICD-10-CM

## 2018-05-01 DIAGNOSIS — I5033 Acute on chronic diastolic (congestive) heart failure: Secondary | ICD-10-CM

## 2018-05-01 DIAGNOSIS — F329 Major depressive disorder, single episode, unspecified: Secondary | ICD-10-CM | POA: Diagnosis present

## 2018-05-01 DIAGNOSIS — R0602 Shortness of breath: Secondary | ICD-10-CM | POA: Diagnosis not present

## 2018-05-01 DIAGNOSIS — I5030 Unspecified diastolic (congestive) heart failure: Secondary | ICD-10-CM | POA: Diagnosis not present

## 2018-05-01 DIAGNOSIS — IMO0002 Reserved for concepts with insufficient information to code with codable children: Secondary | ICD-10-CM | POA: Diagnosis present

## 2018-05-01 DIAGNOSIS — E8779 Other fluid overload: Secondary | ICD-10-CM | POA: Diagnosis not present

## 2018-05-01 HISTORY — DX: Type 2 diabetes mellitus without complications: E11.9

## 2018-05-01 HISTORY — DX: Heart failure, unspecified: I50.9

## 2018-05-01 LAB — ALBUMIN, PLEURAL OR PERITONEAL FLUID: Albumin, Fluid: 1.7 g/dL

## 2018-05-01 LAB — URINALYSIS, ROUTINE W REFLEX MICROSCOPIC
Bacteria, UA: NONE SEEN
Bilirubin Urine: NEGATIVE
Glucose, UA: NEGATIVE mg/dL
Hgb urine dipstick: NEGATIVE
Ketones, ur: NEGATIVE mg/dL
Leukocytes, UA: NEGATIVE
Nitrite: NEGATIVE
Protein, ur: 30 mg/dL — AB
Specific Gravity, Urine: 1.009 (ref 1.005–1.030)
pH: 7 (ref 5.0–8.0)

## 2018-05-01 LAB — TYPE AND SCREEN
ABO/RH(D): O POS
Antibody Screen: NEGATIVE

## 2018-05-01 LAB — BODY FLUID CELL COUNT WITH DIFFERENTIAL
Eos, Fluid: 0 %
Lymphs, Fluid: 35 %
Monocyte-Macrophage-Serous Fluid: 50 % (ref 50–90)
Neutrophil Count, Fluid: 15 % (ref 0–25)
Total Nucleated Cell Count, Fluid: 88 cu mm (ref 0–1000)

## 2018-05-01 LAB — HEMOGLOBIN A1C
Hgb A1c MFr Bld: 5.2 % (ref 4.8–5.6)
Mean Plasma Glucose: 102.54 mg/dL

## 2018-05-01 LAB — CBC
HCT: 35.5 % — ABNORMAL LOW (ref 36.0–46.0)
Hemoglobin: 11.2 g/dL — ABNORMAL LOW (ref 12.0–15.0)
MCH: 28.6 pg (ref 26.0–34.0)
MCHC: 31.5 g/dL (ref 30.0–36.0)
MCV: 90.6 fL (ref 80.0–100.0)
Platelets: 282 10*3/uL (ref 150–400)
RBC: 3.92 MIL/uL (ref 3.87–5.11)
RDW: 14.6 % (ref 11.5–15.5)
WBC: 8 10*3/uL (ref 4.0–10.5)
nRBC: 0 % (ref 0.0–0.2)

## 2018-05-01 LAB — COMPREHENSIVE METABOLIC PANEL
ALT: 9 U/L (ref 0–44)
AST: 22 U/L (ref 15–41)
Albumin: 3 g/dL — ABNORMAL LOW (ref 3.5–5.0)
Alkaline Phosphatase: 99 U/L (ref 38–126)
Anion gap: 7 (ref 5–15)
BUN: <5 mg/dL — ABNORMAL LOW (ref 8–23)
CO2: 26 mmol/L (ref 22–32)
Calcium: 8.9 mg/dL (ref 8.9–10.3)
Chloride: 103 mmol/L (ref 98–111)
Creatinine, Ser: 0.89 mg/dL (ref 0.44–1.00)
GFR calc Af Amer: >60 mL/min (ref >60)
GFR calc non Af Amer: >60 mL/min (ref >60)
Glucose, Bld: 137 mg/dL — ABNORMAL HIGH (ref 70–99)
Potassium: 2.9 mmol/L — ABNORMAL LOW (ref 3.5–5.1)
Sodium: 136 mmol/L (ref 135–145)
Total Bilirubin: 2.1 mg/dL — ABNORMAL HIGH (ref 0.3–1.2)
Total Protein: 8.1 g/dL (ref 6.5–8.1)

## 2018-05-01 LAB — I-STAT TROPONIN, ED: Troponin i, poc: 0 ng/mL (ref 0.00–0.08)

## 2018-05-01 LAB — GLUCOSE, PLEURAL OR PERITONEAL FLUID: Glucose, Fluid: 110 mg/dL

## 2018-05-01 LAB — ABO/RH: ABO/RH(D): O POS

## 2018-05-01 LAB — APTT: aPTT: 34 seconds (ref 24–36)

## 2018-05-01 LAB — LIPASE, BLOOD: Lipase: 26 U/L (ref 11–51)

## 2018-05-01 LAB — PROTIME-INR
INR: 1.43
Prothrombin Time: 17.3 seconds — ABNORMAL HIGH (ref 11.4–15.2)

## 2018-05-01 LAB — GLUCOSE, CAPILLARY
Glucose-Capillary: 130 mg/dL — ABNORMAL HIGH (ref 70–99)
Glucose-Capillary: 103 mg/dL — ABNORMAL HIGH (ref 70–99)

## 2018-05-01 LAB — GRAM STAIN

## 2018-05-01 LAB — AMMONIA: Ammonia: 36 umol/L — ABNORMAL HIGH (ref 9–35)

## 2018-05-01 LAB — BRAIN NATRIURETIC PEPTIDE: B Natriuretic Peptide: 1514.4 pg/mL — ABNORMAL HIGH (ref 0.0–100.0)

## 2018-05-01 LAB — LACTATE DEHYDROGENASE, PLEURAL OR PERITONEAL FLUID: LD, Fluid: 66 U/L — ABNORMAL HIGH (ref 3–23)

## 2018-05-01 MED ORDER — ALBUMIN HUMAN 25 % IV SOLN
25.0000 g | Freq: Once | INTRAVENOUS | Status: AC
  Administered 2018-05-01: 25 g via INTRAVENOUS
  Filled 2018-05-01: qty 100

## 2018-05-01 MED ORDER — IOPAMIDOL (ISOVUE-370) INJECTION 76%: 100.0000 mL | Freq: Once | INTRAVENOUS | Status: DC | PRN

## 2018-05-01 MED ORDER — IOPAMIDOL (ISOVUE-370) INJECTION 76%
INTRAVENOUS | Status: AC
  Filled 2018-05-01: qty 100

## 2018-05-01 MED ORDER — ENOXAPARIN SODIUM 40 MG/0.4ML ~~LOC~~ SOLN
40.0000 mg | SUBCUTANEOUS | Status: DC
  Administered 2018-05-01 – 2018-05-04 (×4): 40 mg via SUBCUTANEOUS
  Filled 2018-05-01 (×4): qty 0.4

## 2018-05-01 MED ORDER — TRIAMCINOLONE ACETONIDE 0.1 % EX CREA
1.0000 "application " | TOPICAL_CREAM | Freq: Two times a day (BID) | CUTANEOUS | Status: DC | PRN
  Administered 2018-05-03 – 2018-05-09 (×8): 1 via TOPICAL
  Filled 2018-05-01 (×2): qty 15

## 2018-05-01 MED ORDER — GABAPENTIN 600 MG PO TABS
300.0000 mg | ORAL_TABLET | Freq: Two times a day (BID) | ORAL | Status: DC
  Administered 2018-05-01 – 2018-05-11 (×20): 300 mg via ORAL
  Filled 2018-05-01 (×20): qty 1

## 2018-05-01 MED ORDER — MAGIC MOUTHWASH
5.0000 mL | Freq: Three times a day (TID) | ORAL | Status: DC | PRN
  Filled 2018-05-01: qty 5

## 2018-05-01 MED ORDER — LIDOCAINE HCL (PF) 1 % IJ SOLN
INTRAMUSCULAR | Status: AC
  Filled 2018-05-01: qty 30

## 2018-05-01 MED ORDER — FUROSEMIDE 10 MG/ML IJ SOLN
20.0000 mg | Freq: Once | INTRAMUSCULAR | Status: AC
  Administered 2018-05-01: 20 mg via INTRAVENOUS
  Filled 2018-05-01: qty 2

## 2018-05-01 MED ORDER — FUROSEMIDE 20 MG PO TABS
20.0000 mg | ORAL_TABLET | Freq: Every day | ORAL | Status: DC
  Administered 2018-05-01 – 2018-05-02 (×2): 20 mg via ORAL
  Filled 2018-05-01 (×2): qty 1

## 2018-05-01 MED ORDER — INSULIN ASPART 100 UNIT/ML ~~LOC~~ SOLN: 0.0000 [IU] | SUBCUTANEOUS | Status: DC

## 2018-05-01 MED ORDER — POTASSIUM CHLORIDE CRYS ER 20 MEQ PO TBCR
40.0000 meq | EXTENDED_RELEASE_TABLET | Freq: Once | ORAL | Status: AC
  Administered 2018-05-01: 40 meq via ORAL
  Filled 2018-05-01: qty 2

## 2018-05-01 MED ORDER — ENSURE ENLIVE PO LIQD
237.0000 mL | Freq: Two times a day (BID) | ORAL | Status: DC
  Administered 2018-05-02 (×2): 237 mL via ORAL

## 2018-05-01 MED ORDER — LABETALOL HCL 5 MG/ML IV SOLN: 10.0000 mg | INTRAVENOUS | Status: DC | PRN

## 2018-05-01 MED ORDER — IOPAMIDOL (ISOVUE-370) INJECTION 76%
100.0000 mL | Freq: Once | INTRAVENOUS | Status: AC | PRN
  Administered 2018-05-01: 100 mL via INTRAVENOUS

## 2018-05-01 NOTE — ED Triage Notes (Signed)
Pt to ER for evaluation of 1 month worsening shortness of breath. Abdomen noted to be very distended. Pt appears pale.

## 2018-05-01 NOTE — Progress Notes (Signed)
Received consult for PIV. Pt has 20g in L AC. Site is patent. Pt declined additional site at this time. Currently has orders for CT only. Instructed RN to place new order if additional site is needed.

## 2018-05-01 NOTE — Telephone Encounter (Signed)
Pt calling with complaints of having difficulty breathing. Pt states that she has been experiencing this for the past 2 months. Pt speaking in phrases while on the phone with triage nurse. Pt's sister, Mariann Laster gets on the phone and states that the she is just coming in from out of town and that the pt has not been acting like her self. Pt's sister states that the pt is wheezing and her legs and feet are swollen. Pt's sister also states that the pt's legs are darkening in color and have multiple scabs. Pt has a history of CHF and states she has been taking Lasix daily as prescribed and has been able to urinate without difficulty. Pt advised to seek treatment in ED for current symptoms. Pt verbalized understanding.  Reason for Disposition . [1] MODERATE difficulty breathing (e.g., speaks in phrases, SOB even at rest, pulse 100-120) AND [2] NEW-onset or WORSE than normal  Answer Assessment - Initial Assessment Questions 1. RESPIRATORY STATUS: "Describe your breathing?" (e.g., wheezing, shortness of breath, unable to speak, severe coughing)      Shortness of breath, speaking in phrases while on the phone with triage nurse 2. ONSET: "When did this breathing problem begin?"      2 months ago 3. PATTERN "Does the difficult breathing come and go, or has it been constant since it started?"      Pt states this has been ongoing since 2 months ago 4. SEVERITY: "How bad is your breathing?" (e.g., mild, moderate, severe)    - MILD: No SOB at rest, mild SOB with walking, speaks normally in sentences, can lay down, no retractions, pulse < 100.    - MODERATE: SOB at rest, SOB with minimal exertion and prefers to sit, cannot lie down flat, speaks in phrases, mild retractions, audible wheezing, pulse 100-120.    - SEVERE: Very SOB at rest, speaks in single words, struggling to breathe, sitting hunched forward, retractions, pulse > 120      moderate 5. RECURRENT SYMPTOM: "Have you had difficulty breathing before?" If so,  ask: "When was the last time?" and "What happened that time?"      Yes, has occurred 2 months ago 6. CARDIAC HISTORY: "Do you have any history of heart disease?" (e.g., heart attack, angina, bypass surgery, angioplasty)      Congestive heart failure 7. LUNG HISTORY: "Do you have any history of lung disease?"  (e.g., pulmonary embolus, asthma, emphysema)     Pt denies hx of lung disease 8. CAUSE: "What do you think is causing the breathing problem?"      Unknown 9. OTHER SYMPTOMS: "Do you have any other symptoms? (e.g., dizziness, runny nose, cough, chest pain, fever)     No 10. PREGNANCY: "Is there any chance you are pregnant?" "When was your last menstrual period?"       n/a 11. TRAVEL: "Have you traveled out of the country in the last month?" (e.g., travel history, exposures)       no  Protocols used: BREATHING DIFFICULTY-A-AH

## 2018-05-01 NOTE — H&P (Addendum)
History and Physical  Gloria [name]:[id number] DOB: 1950/08/11 DOA: 05/01/2018  Referring physician: Dr Gloria [name]  PCP: Gloria Borg, MD  Outpatient Specialists: None Patient coming from: Home  Chief Complaint: Shortness of breath for 2 months  HPI: Gloria [name] is a 67 y.o. female with medical history significant for chronic diastolic CHF, hypertension, asthma not treated, OSA noncompliant with CPAP, type 2 diabetes, polyneuropathy, hyperlipidemia, who presented to ED [facility] brought in by her sister and brother-in-law due to worsening shortness of breath at rest.  Patient is alert and oriented x3.  Reports that in the last 2 months she has been having progressive worsening shortness of breath.  Associated with increasing abdominal girth and weight loss more than 20 pounds in the last 2 months.  Did not seek help for this but states that she has a PCP that she sees once a year.  Denies any chest pain or palpitations.  Denies melena, hematochezia or hematemesis.  Denies fever, chills, nausea, abdominal pain, diarrhea or constipation.  ED Course: Upon presentation to the ED at [facility] patient appears volume overload.  Chest x-ray revealed pulmonary edema with cardiomegaly.  CT angio PE negative for PE but concerning for large abdominal ascites.  TRH asked to admit.  Review of Systems: Review of systems as noted in the HPI. All other systems reviewed and are negative.   Past Medical History:  Diagnosis Date  . Allergic rhinitis, cause unspecified 03/30/2013  . Anxiety state, unspecified   . Arthritis   . Asthma   . Borderline diabetes mellitus    a1c 7.0 (07/26/13)   . Chronic bronchitis   . Depressive disorder, not elsewhere classified   . Diabetes mellitus without complication ([place])   . Difficulty waking    hard to wake up past sedation!  . Migraine, unspecified, without mention of intractable migraine without mention of status migrainosus   . OSA (obstructive sleep apnea) 12/25/2016  . Other  and unspecified hyperlipidemia   . Panic attacks   . Postmenopausal symptoms   . Unspecified essential hypertension   . Unspecified urinary incontinence   . UTI (lower urinary tract infection)    on 02/05/16/on meds   Past Surgical History:  Procedure Laterality Date  . ABDOMINAL HYSTERECTOMY  2002  . DIAGNOSTIC LAPAROSCOPY    . dislocated shoulder     right  . tumor right ankle      Social History:  reports that she has never smoked. She has never used smokeless tobacco. She reports that she does not drink alcohol or use drugs.   Allergies  Allergen Reactions  . Compazine Other (See Comments)    Stroke like symptoms  . Haloperidol Decanoate Other (See Comments)    Extrapyramidal syndrome  . Penicillins Nausea And Vomiting  . Glipizide Other (See Comments)    dyspnea  . Metformin And Related Other (See Comments)    GI upset  . Codeine Itching  . Lisinopril Cough    Family History  Problem Relation Age of Onset  . Arthritis Unknown        family history  . Hypertension Unknown        grandparent  . Colon cancer Maternal Grandfather   . Asthma Son   . Rheum arthritis Mother   . Hypertension Mother   . Diabetes Maternal Uncle   . Diabetes Paternal Uncle   . Diabetes Maternal Grandmother      Prior to Admission medications   Medication Sig Start Date End  Date Taking? Authorizing Provider  furosemide (LASIX) 20 MG tablet Take 1 tablet (20 mg total) by mouth daily. 02/25/18  Yes Gloria Borg, MD  magic mouthwash SOLN Take 5 mLs by mouth 3 (three) times daily as needed for mouth pain. 02/25/18  Yes Gloria Borg, MD  triamcinolone cream (KENALOG) 0.1 % Apply 1 application topically 2 (two) times daily. Patient taking differently: Apply 1 application topically 2 (two) times daily as needed (rash).  02/25/18 02/25/19 Yes Gloria Borg, MD  glucose blood (ACCU-CHEK AVIVA) test strip Use as instructed four times per day 08/26/17   Philemon Kingdom, MD  Insulin Glargine  (LANTUS SOLOSTAR) 100 UNIT/ML Solostar Pen Inject 24 Units into the skin daily. Patient not taking: Reported on 05/01/2018 08/26/17   Philemon Kingdom, MD  Insulin Pen Needle (BD PEN NEEDLE NANO U/F) 32G X 4 MM MISC USE ONCE DAILY 08/26/17   Philemon Kingdom, MD  irbesartan (AVAPRO) 150 MG tablet Take 150 mg by mouth daily.    [provider]  irbesartan (AVAPRO) 300 MG tablet Take 1 tablet (300 mg total) by mouth daily. Patient not taking: Reported on 05/01/2018 11/06/16   Gloria Borg, MD  metFORMIN (GLUCOPHAGE-XR) 500 MG 24 hr tablet Take 1 tablet (500 mg total) by mouth 2 (two) times daily with a meal. Patient not taking: Reported on 05/01/2018 08/26/17   Philemon Kingdom, MD  potassium chloride (K-DUR,KLOR-CON) 10 MEQ tablet Take 10 mEq by mouth daily.    [provider]    Physical Exam: BP (!) 147/84   Pulse (!) 106   Temp 97.6 F (36.4 C) (Oral)   Resp (!) 31   SpO2 99%   . General: 67 y.o. year-old female well developed well nourished in no acute distress.  Alert and oriented x3. . Cardiovascular: Regular rate and rhythm with no rales or gallops.  Right-sided JVD.  No thyromegaly. Marland Kitchen Respiratory: Diffuse rales bilaterally with no wheezes.  Poor inspiratory effort.  . Abdomen: Soft nontender severely distended with normal bowel sounds x4 quadrants. . Muskuloskeletal: No cyanosis or clubbing.  2+ pitting edema in lower extremities bilaterally.  Unable to palpate dorsalis pedis pulses due to edema.  . Neuro: CN II-XII intact, strength, sensation, reflexes . Skin: Rashes noted in upper extremities bilaterally and upper back. . Psychiatry: Poor judgement and insight. Mood is appropriate for condition and setting          Labs on Admission:  Basic Metabolic Panel: Recent Labs  Lab 05/01/18 1249  NA 136  K 2.9*  CL 103  CO2 26  GLUCOSE 137*  BUN <5*  CREATININE 0.89  CALCIUM 8.9   Liver Function Tests: Recent Labs  Lab 05/01/18 1249  AST 22  ALT 9    ALKPHOS 99  BILITOT 2.1*  PROT 8.1  ALBUMIN 3.0*   Recent Labs  Lab 05/01/18 1249  LIPASE 26   No results for input(s): AMMONIA in the last 168 hours. CBC: Recent Labs  Lab 05/01/18 1249  WBC 8.0  HGB 11.2*  HCT 35.5*  MCV 90.6  PLT 282   Cardiac Enzymes: No results for input(s): CKTOTAL, CKMB, CKMBINDEX, TROPONINI in the last 168 hours.  BNP (last 3 results) Recent Labs    05/01/18 1247  BNP 1,514.4*    ProBNP (last 3 results) No results for input(s): PROBNP in the last 8760 hours.  CBG: No results for input(s): GLUCAP in the last 168 hours.  Radiological Exams on Admission: Dg Chest 2  View  Result Date: 05/01/2018 CLINICAL DATA:  Shortness of breath for 1 month EXAM: CHEST - 2 VIEW COMPARISON:  02/25/2018 FINDINGS: Cardiac shadow is enlarged. The overall inspiratory effort is poor without focal infiltrate or sizable effusion. Degenerative changes of thoracic spine are noted. IMPRESSION: Poor inspiratory effort.  No acute abnormality seen. Electronically Signed   By: Inez Catalina M.D.   On: 05/01/2018 14:41   Ct Angio Chest Pe W And/or Wo Contrast  Result Date: 05/01/2018 CLINICAL DATA:  Abdominal pain and swelling EXAM: CT ANGIOGRAPHY CHEST CT ABDOMEN AND PELVIS WITH CONTRAST TECHNIQUE: Multidetector CT imaging of the chest was performed using the standard protocol during bolus administration of intravenous contrast. Multiplanar CT image reconstructions and MIPs were obtained to evaluate the vascular anatomy. Multidetector CT imaging of the abdomen and pelvis was performed using the standard protocol during bolus administration of intravenous contrast. CONTRAST:  125mL ISOVUE-370 IOPAMIDOL (ISOVUE-370) INJECTION 76% COMPARISON:  None. FINDINGS: CTA CHEST FINDINGS Cardiovascular: There is incomplete coverage of the lower chest such that lower lobe segmental and subsegmental branches are incompletely covered. Reportedly this coverage is related to patient movement after  scout acquisition. No evidence of pulmonary embolism. Cardiomegaly without pericardial effusion. The heart is incompletely covered. Limited aortic opacification. Mediastinum/Nodes: Negative for thoracic adenopathy. There is bilateral axillary adenopathy that is mild and likely related to patient's anasarca. Lungs/Pleura: Most convincing on coronal reformats there is interlobular septal thickening that is mild. No alveolar edema or pneumonia. Musculoskeletal: No acute finding. Review of the MIP images confirms the above findings. CT ABDOMEN and PELVIS FINDINGS Hepatobiliary: Heterogeneous enhancement the liver that resolves on delayed phase, with "nutmeg" appearance, likely passive congestion. There is no definitive changes of cirrhosis. No evidence of biliary obstruction or stone. Pancreas: Unremarkable. Spleen: Unremarkable. Adrenals/Urinary Tract: Negative adrenals. No hydronephrosis or stone. Unremarkable bladder. Stomach/Bowel:  No obstruction. No inflammatory changes. Vascular/Lymphatic: Atherosclerotic calcification. No acute vascular finding. The portal venous system is patent. No mass or adenopathy. Reproductive:Hysterectomy Other: Massive ascites. Mild reticulation of the greater omentum is likely edema. There is no peritoneal nodularity or septation. Musculoskeletal: Degenerative changes without acute or aggressive finding Please note that axial portal venous phase images are only through a partial volume such that some of the abdominal structures have to be reviewed on reformatted images. Image repair is still underway; the study will be signed for expeditious patient care. Review of the MIP images confirms the above findings. IMPRESSION: 1. Massive ascites and generalized anasarca. Heterogeneous liver likely from passive congestion and a cardiac cause is favored. 2. Cardiomegaly. 3. Minimal interstitial pulmonary edema. 4. Negative for pulmonary embolism. Note that the lower lobe distal segmental and  subsegmental vessels are incompletely covered due to patient motion. Electronically Signed   By: Monte Fantasia M.D.   On: 05/01/2018 15:25   Ct Abdomen Pelvis W Contrast  Result Date: 05/01/2018 CLINICAL DATA:  Abdominal pain and swelling EXAM: CT ANGIOGRAPHY CHEST CT ABDOMEN AND PELVIS WITH CONTRAST TECHNIQUE: Multidetector CT imaging of the chest was performed using the standard protocol during bolus administration of intravenous contrast. Multiplanar CT image reconstructions and MIPs were obtained to evaluate the vascular anatomy. Multidetector CT imaging of the abdomen and pelvis was performed using the standard protocol during bolus administration of intravenous contrast. CONTRAST:  178mL ISOVUE-370 IOPAMIDOL (ISOVUE-370) INJECTION 76% COMPARISON:  None. FINDINGS: CTA CHEST FINDINGS Cardiovascular: There is incomplete coverage of the lower chest such that lower lobe segmental and subsegmental branches are incompletely covered. Reportedly this coverage  is related to patient movement after scout acquisition. No evidence of pulmonary embolism. Cardiomegaly without pericardial effusion. The heart is incompletely covered. Limited aortic opacification. Mediastinum/Nodes: Negative for thoracic adenopathy. There is bilateral axillary adenopathy that is mild and likely related to patient's anasarca. Lungs/Pleura: Most convincing on coronal reformats there is interlobular septal thickening that is mild. No alveolar edema or pneumonia. Musculoskeletal: No acute finding. Review of the MIP images confirms the above findings. CT ABDOMEN and PELVIS FINDINGS Hepatobiliary: Heterogeneous enhancement the liver that resolves on delayed phase, with "nutmeg" appearance, likely passive congestion. There is no definitive changes of cirrhosis. No evidence of biliary obstruction or stone. Pancreas: Unremarkable. Spleen: Unremarkable. Adrenals/Urinary Tract: Negative adrenals. No hydronephrosis or stone. Unremarkable bladder.  Stomach/Bowel:  No obstruction. No inflammatory changes. Vascular/Lymphatic: Atherosclerotic calcification. No acute vascular finding. The portal venous system is patent. No mass or adenopathy. Reproductive:Hysterectomy Other: Massive ascites. Mild reticulation of the greater omentum is likely edema. There is no peritoneal nodularity or septation. Musculoskeletal: Degenerative changes without acute or aggressive finding Please note that axial portal venous phase images are only through a partial volume such that some of the abdominal structures have to be reviewed on reformatted images. Image repair is still underway; the study will be signed for expeditious patient care. Review of the MIP images confirms the above findings. IMPRESSION: 1. Massive ascites and generalized anasarca. Heterogeneous liver likely from passive congestion and a cardiac cause is favored. 2. Cardiomegaly. 3. Minimal interstitial pulmonary edema. 4. Negative for pulmonary embolism. Note that the lower lobe distal segmental and subsegmental vessels are incompletely covered due to patient motion. Electronically Signed   By: Monte Fantasia M.D.   On: 05/01/2018 15:25    EKG: I independently viewed the EKG done and my findings are as followed: Sinus tachycardia with rate of 103 with no specific ST-T changes.  Assessment/Plan Present on Admission: . Fluid overload  Active Problems:   Fluid overload  Acute on chronic diastolic CHF Last 2D echo done on 2017 revealed preserved LVEF BNP on admission greater than 1500 20 mg of IV Lasix given in the ED Gentle diuresis due to recent IV contrast given for CT angio to rule out PE Strict I's and O's and daily weight Obtain 2D echo, if abnormal consult cardiology in the morning  Pulmonary edema suspect cardiac etiology Independently reviewed chest x-ray done on admission which revealed increase in pulmonary vascularity with cardiomegaly Received 20 mg of IV Lasix in the ED Resume home  Lasix dose  Liver ascites with no prior history of liver disease LFTs normal however PT elevated Obtain viral hepatitis panel Obtain ammonia level Obtain abdominal ultrasound IR paracentesis Order albumin to be administered after paracentesis to maintain blood pressure  Severe hypokalemia Potassium 2.9 Replete with p.o. potassium 40 mEq x 3 doses Repeat BMP in the morning  Bilateral upper extremity and upper back rash  Unclear etiology Obtain HIV screening  Chronic normocytic anemia Hemoglobin 11.2 with MCV 90.6 No sign of overt bleeding Continue to monitor Repeat CBC in the morning  OSA noncompliant with CPAP CPAP at night  Type 2 diabetes Obtain hemoglobin A1c Hold p.o. home diabetic medications Start insulin sliding scale Avoid hypoglycemia  Diabetes polyneuropathy Resume gabapentin  Hypertension Blood pressure stable Hold losartan due to recent contrast administration to avoid AKI Continue to monitor vital signs  Weight loss/physical debility Dietary consult to assist with nutrition Oral supplement PT to assess for ambulatory dysfunction Fall precaution   DVT prophylaxis: Subcu Lovenox  daily  Code Status: Full code  Family Communication: Sister and brother-in-law at bedside  Disposition Plan: Admit to telemetry unit  Consults called: None  Admission status: Inpatient status    Kayleen Memos MD [facility] Pager [phone]  If 7PM-7AM, please contact night-coverage [email or web address] Password [email or web address]  05/01/2018, 4:30 PM

## 2018-05-01 NOTE — Procedures (Signed)
PROCEDURE SUMMARY:  Successful US guided paracentesis from LLQ.  Yielded 6 L of clear yellow fluid.  No immediate complications.  Pt tolerated well.   Specimen was sent for labs.  Ascencion Dike PA-C 05/01/2018 5:36 PM

## 2018-05-01 NOTE — ED Provider Notes (Signed)
[facility] EMERGENCY DEPARTMENT Provider Note   CSN: [id number] Arrival date & time: 05/01/18  1157     History   Chief Complaint Chief Complaint  Patient presents with  . Shortness of Breath    HPI Gloria [name] is a 67 y.o. female with history of diabetes mellitus, migraines, OSA, hyperlipidemia, hypertension, CHF presents for evaluation of gradual onset, progressively worsening shortness of breath, abdominal distention, and bilateral lower extremity edema for approximately 1 to 2 months.  Patient called into her primary care office earlier today and was instructed to go to the ED for further evaluation and management.  She notes dyspnea on exertion, denies chest pain, abdominal pain, nausea, vomiting, fevers, or chills.  She denies lightheadedness.  She does note fatigue.  Edema in her abdomen and lower extremities has been worsening despite taking her 20 mg of Lasix daily.  She also notes a rash to her bilateral upper extremities and upper back which is pruritic.  Denies any new soaps, shampoos, detergents, or lotions.  Has been applying triamcinolone cream without significant relief.  Patient sister states that she has lost a large amount of weight unintentionally over the past several months.  She is a non-smoker.  No recent travel or surgeries, no hemoptysis, no prior history of DVT or PE.  He has been seen by her PCP for some of these complaints and abdominal ultrasound was completed on 03/05/2018 which showed gallbladder sludge, moderate ascites and possible left pleural effusion.  The history is provided by the patient.    Past Medical History:  Diagnosis Date  . Allergic rhinitis, cause unspecified 03/30/2013  . Anxiety state, unspecified   . Arthritis   . Asthma   . Borderline diabetes mellitus    a1c 7.0 (07/26/13)   . Chronic bronchitis   . Depressive disorder, not elsewhere classified   . Diabetes mellitus without complication ([place])   . Difficulty  waking    hard to wake up past sedation!  . Migraine, unspecified, without mention of intractable migraine without mention of status migrainosus   . OSA (obstructive sleep apnea) 12/25/2016  . Other and unspecified hyperlipidemia   . Panic attacks   . Postmenopausal symptoms   . Unspecified essential hypertension   . Unspecified urinary incontinence   . UTI (lower urinary tract infection)    on 02/05/16/on meds    Patient Active Problem List   Diagnosis Date Noted  . Fluid overload 05/01/2018  . Ascites 03/05/2018  . Abdominal swelling, generalized 02/25/2018  . Mouth pain 02/02/2018  . Rash 02/02/2018  . Peripheral neuropathy 08/26/2017  . Non-intractable vomiting with nausea 05/01/2017  . Acute pain of right knee 04/02/2017  . Syst congestive heart failure with reduced LV function, NYHA class 1 ([place]) 03/19/2017  . OSA (obstructive sleep apnea) 12/25/2016  . Hypersomnolence 11/10/2016  . Positive ANA (antinuclear antibody) 07/11/2016  . Primary osteoarthritis of both hands 07/11/2016  . Fx sacrum/coccyx-closed ([place]) 07/11/2016  . Left arm pain 06/25/2016  . Polyarthralgia 03/27/2016  . Degenerative arthritis of knee, bilateral 03/27/2016  . UTI (urinary tract infection) 02/28/2016  . Low back pain 02/28/2016  . Coccyxdynia 02/28/2016  . Bilateral knee pain 02/28/2016  . Bilateral ankle pain 02/28/2016  . Encounter for preventative adult health care exam with abnormal findings 10/03/2015  . Peripheral edema 10/03/2015  . Morbid obesity ([place]) 01/01/2015  . 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 Asthma, chronic 10/17/2014  . Dyspnea 03/29/2014  . Upper  airway cough syndrome 03/29/2014  . Vocal cord dysfunction 03/21/2014  . Uncontrolled type 2 diabetes mellitus with diabetic neuropathy, without long-term current use of insulin ([place])   . Allergic rhinitis, cause unspecified 03/30/2013  . Chronic insomnia 09/21/2012  . Cough 04/15/2012  . Menopause 02/28/2011    . Hot flashes, menopausal 02/28/2011  . Wheezing 11/02/2010  . Hyperlipidemia 06/18/2010  . ARTHRITIS 06/18/2010  . ANXIETY 06/14/2010  . DEPRESSION 06/14/2010  . Migraine 06/14/2010  . Essential hypertension 06/14/2010    Past Surgical History:  Procedure Laterality Date  . ABDOMINAL HYSTERECTOMY  2002  . DIAGNOSTIC LAPAROSCOPY    . dislocated shoulder     right  . tumor right ankle       OB History    Gravida  1   Para  1   Term  1   Preterm      AB      Living  1     SAB      TAB      Ectopic      Multiple      Live Births               Home Medications    Prior to Admission medications   Medication Sig Start Date End Date Taking? Authorizing Provider  furosemide (LASIX) 20 MG tablet Take 1 tablet (20 mg total) by mouth daily. 02/25/18  Yes Biagio Borg, MD  magic mouthwash SOLN Take 5 mLs by mouth 3 (three) times daily as needed for mouth pain. 02/25/18  Yes Biagio Borg, MD  triamcinolone cream (KENALOG) 0.1 % Apply 1 application topically 2 (two) times daily. Patient taking differently: Apply 1 application topically 2 (two) times daily as needed (rash).  02/25/18 02/25/19 Yes Biagio Borg, MD  glucose blood (ACCU-CHEK AVIVA) test strip Use as instructed four times per day 08/26/17   Philemon Kingdom, MD  Insulin Glargine (LANTUS SOLOSTAR) 100 UNIT/ML Solostar Pen Inject 24 Units into the skin daily. Patient not taking: Reported on 05/01/2018 08/26/17   Philemon Kingdom, MD  Insulin Pen Needle (BD PEN NEEDLE NANO U/F) 32G X 4 MM MISC USE ONCE DAILY 08/26/17   Philemon Kingdom, MD  irbesartan (AVAPRO) 150 MG tablet Take 150 mg by mouth daily.    [provider]  irbesartan (AVAPRO) 300 MG tablet Take 1 tablet (300 mg total) by mouth daily. Patient not taking: Reported on 05/01/2018 11/06/16   Biagio Borg, MD  metFORMIN (GLUCOPHAGE-XR) 500 MG 24 hr tablet Take 1 tablet (500 mg total) by mouth 2 (two) times daily with a meal. Patient not  taking: Reported on 05/01/2018 08/26/17   Philemon Kingdom, MD  potassium chloride (K-DUR,KLOR-CON) 10 MEQ tablet Take 10 mEq by mouth daily.    [provider]    Family History Family History  Problem Relation Age of Onset  . Arthritis Unknown        family history  . Hypertension Unknown        grandparent  . Colon cancer Maternal Grandfather   . Asthma Son   . Rheum arthritis Mother   . Hypertension Mother   . Diabetes Maternal Uncle   . Diabetes Paternal Uncle   . Diabetes Maternal Grandmother     Social History Social History   Tobacco Use  . Smoking status: Never Smoker  . Smokeless tobacco: Never Used  Substance Use Topics  . Alcohol use: No    Alcohol/week: 0.0 standard drinks  .  Drug use: No     Allergies   Compazine; Haloperidol decanoate; Penicillins; Glipizide; Metformin and related; Codeine; and Lisinopril   Review of Systems Review of Systems  Constitutional: Positive for fatigue. Negative for chills and fever.  Respiratory: Positive for shortness of breath.   Cardiovascular: Positive for leg swelling. Negative for chest pain.  Gastrointestinal: Positive for abdominal distention. Negative for abdominal pain, blood in stool, nausea and vomiting.  Skin: Positive for color change and rash.  All other systems reviewed and are negative.    Physical Exam Updated Vital Signs BP 123/77   Pulse (!) 103   Temp 97.7 F (36.5 C) (Oral)   Resp 18   Ht 5\' 6"  (1.676 m)   Wt 94.8 kg   SpO2 100%   BMI 33.73 kg/m   Physical Exam  Constitutional: She appears well-developed and well-nourished. No distress.  HENT:  Head: Normocephalic and atraumatic.  Eyes: Pupils are equal, round, and reactive to light. Conjunctivae are normal. Right eye exhibits no discharge. Left eye exhibits no discharge.  Bilateral scleral icterus  Neck: No JVD present. No tracheal deviation present.  Cardiovascular:  Tachycardic, 2+ pitting edema of the bilateral lower  extremities.  Homans sign absent bilaterally.  No palpable cords.      Pulmonary/Chest: Effort normal. She exhibits no tenderness.  Globally diminished breath sounds.  Patient speaking in short sentences.  No tenderness to palpation of the chest wall.  Abdominal: She exhibits distension, fluid wave and ascites. There is no tenderness. There is no guarding.  Musculoskeletal:       Right lower leg: She exhibits edema. She exhibits no tenderness.       Left lower leg: She exhibits edema. She exhibits no tenderness.  Neurological: She is alert.  Skin: Skin is warm and dry. Rash noted. No erythema.  See below images.  Patient has multiple nodular lesions on the bilateral upper extremities and the upper back with some hyperpigmented skin surrounding.  Nontender to palpation.  Excoriations noted.  Psychiatric: She has a normal mood and affect. Her behavior is normal.  Nursing note and vitals reviewed.    ED Treatments / Results  Labs (all labs ordered are listed, but only abnormal results are displayed) Labs Reviewed  COMPREHENSIVE METABOLIC PANEL - Abnormal; Notable for the following components:      Result Value   Potassium 2.9 (*)    Glucose, Bld 137 (*)    BUN <5 (*)    Albumin 3.0 (*)    Total Bilirubin 2.1 (*)    All other components within normal limits  CBC - Abnormal; Notable for the following components:   Hemoglobin 11.2 (*)    HCT 35.5 (*)    All other components within normal limits  URINALYSIS, ROUTINE W REFLEX MICROSCOPIC - Abnormal; Notable for the following components:   Protein, ur 30 (*)    All other components within normal limits  BRAIN NATRIURETIC PEPTIDE - Abnormal; Notable for the following components:   B Natriuretic Peptide 1,514.4 (*)    All other components within normal limits  PROTIME-INR - Abnormal; Notable for the following components:   Prothrombin Time 17.3 (*)    All other components within normal limits  LACTATE DEHYDROGENASE, PLEURAL OR  PERITONEAL FLUID - Abnormal; Notable for the following components:   LD, Fluid 66 (*)    All other components within normal limits  GRAM STAIN  CULTURE, BODY FLUID-BOTTLE  LIPASE, BLOOD  APTT  BODY FLUID CELL COUNT WITH  DIFFERENTIAL  ALBUMIN, PLEURAL OR PERITONEAL FLUID  GLUCOSE, PLEURAL OR PERITONEAL FLUID  HEMOGLOBIN A1C  BRAIN NATRIURETIC PEPTIDE  HEPATITIS PANEL, ACUTE  HIV ANTIBODY (ROUTINE TESTING W REFLEX)  RAPID URINE DRUG SCREEN, HOSP PERFORMED  AMMONIA  PH, BODY FLUID  I-STAT TROPONIN, ED  TYPE AND SCREEN  ABO/RH  CYTOLOGY - NON PAP    EKG EKG Interpretation  Date/Time:  Friday May 01 2018 12:00:26 EDT Ventricular Rate:  101 PR Interval:  180 QRS Duration: 72 QT Interval:  308 QTC Calculation: 399 R Axis:   41 Text Interpretation:  Sinus tachycardia Cannot rule out Anterior infarct , age undetermined Abnormal ECG Confirmed by Dene Gentry [id number]) on 05/01/2018 12:48:56 PM   Radiology Dg Chest 2 View  Result Date: 05/01/2018 CLINICAL DATA:  Shortness of breath for 1 month EXAM: CHEST - 2 VIEW COMPARISON:  02/25/2018 FINDINGS: Cardiac shadow is enlarged. The overall inspiratory effort is poor without focal infiltrate or sizable effusion. Degenerative changes of thoracic spine are noted. IMPRESSION: Poor inspiratory effort.  No acute abnormality seen. Electronically Signed   By: Inez Catalina M.D.   On: 05/01/2018 14:41   Ct Angio Chest Pe W And/or Wo Contrast  Result Date: 05/01/2018 CLINICAL DATA:  Abdominal pain and swelling EXAM: CT ANGIOGRAPHY CHEST CT ABDOMEN AND PELVIS WITH CONTRAST TECHNIQUE: Multidetector CT imaging of the chest was performed using the standard protocol during bolus administration of intravenous contrast. Multiplanar CT image reconstructions and MIPs were obtained to evaluate the vascular anatomy. Multidetector CT imaging of the abdomen and pelvis was performed using the standard protocol during bolus administration of intravenous  contrast. CONTRAST:  158mL ISOVUE-370 IOPAMIDOL (ISOVUE-370) INJECTION 76% COMPARISON:  None. FINDINGS: CTA CHEST FINDINGS Cardiovascular: There is incomplete coverage of the lower chest such that lower lobe segmental and subsegmental branches are incompletely covered. Reportedly this coverage is related to patient movement after scout acquisition. No evidence of pulmonary embolism. Cardiomegaly without pericardial effusion. The heart is incompletely covered. Limited aortic opacification. Mediastinum/Nodes: Negative for thoracic adenopathy. There is bilateral axillary adenopathy that is mild and likely related to patient's anasarca. Lungs/Pleura: Most convincing on coronal reformats there is interlobular septal thickening that is mild. No alveolar edema or pneumonia. Musculoskeletal: No acute finding. Review of the MIP images confirms the above findings. CT ABDOMEN and PELVIS FINDINGS Hepatobiliary: Heterogeneous enhancement the liver that resolves on delayed phase, with "nutmeg" appearance, likely passive congestion. There is no definitive changes of cirrhosis. No evidence of biliary obstruction or stone. Pancreas: Unremarkable. Spleen: Unremarkable. Adrenals/Urinary Tract: Negative adrenals. No hydronephrosis or stone. Unremarkable bladder. Stomach/Bowel:  No obstruction. No inflammatory changes. Vascular/Lymphatic: Atherosclerotic calcification. No acute vascular finding. The portal venous system is patent. No mass or adenopathy. Reproductive:Hysterectomy Other: Massive ascites. Mild reticulation of the greater omentum is likely edema. There is no peritoneal nodularity or septation. Musculoskeletal: Degenerative changes without acute or aggressive finding Please note that axial portal venous phase images are only through a partial volume such that some of the abdominal structures have to be reviewed on reformatted images. Image repair is still underway; the study will be signed for expeditious patient care.  Review of the MIP images confirms the above findings. IMPRESSION: 1. Massive ascites and generalized anasarca. Heterogeneous liver likely from passive congestion and a cardiac cause is favored. 2. Cardiomegaly. 3. Minimal interstitial pulmonary edema. 4. Negative for pulmonary embolism. Note that the lower lobe distal segmental and subsegmental vessels are incompletely covered due to patient motion. Electronically Signed   By:  Monte Fantasia M.D.   On: 05/01/2018 15:25   US Abdomen Complete  Result Date: 05/01/2018 CLINICAL DATA:  67 y/o  F; cirrhosis with ascites. EXAM: ABDOMEN ULTRASOUND COMPLETE COMPARISON:  05/01/2018 CT abdomen and pelvis. FINDINGS: Gallbladder: Gallbladder sludge and small stones. No gallbladder wall thickening. Negative sonographic Murphy's sign. Common bile duct: Diameter: 3 mm Liver: Diffuse heterogeneity of the liver compatible with cirrhosis. No focal liver abnormality identified. Portal vein is patent on color Doppler imaging with normal direction of blood flow towards the liver. IVC: No abnormality visualized. Pancreas: Visualized portion unremarkable. Spleen: Size and appearance within normal limits. Right Kidney: Length: 10.3 cm. Echogenicity within normal limits. No mass or hydronephrosis visualized. Left Kidney: Length: 9.5 cm. Echogenicity within normal limits. No mass or hydronephrosis visualized. Abdominal aorta: No aneurysm visualized. Other findings: Perihepatic ascites. IMPRESSION: 1. Gallbladder sludge and small stones. 2. Cirrhotic liver. 3. Perihepatic ascites. 4. No acute process identified. Electronically Signed   By: Kristine Garbe M.D.   On: 05/01/2018 18:18   Ct Abdomen Pelvis W Contrast  Result Date: 05/01/2018 CLINICAL DATA:  Abdominal pain and swelling EXAM: CT ANGIOGRAPHY CHEST CT ABDOMEN AND PELVIS WITH CONTRAST TECHNIQUE: Multidetector CT imaging of the chest was performed using the standard protocol during bolus administration of intravenous  contrast. Multiplanar CT image reconstructions and MIPs were obtained to evaluate the vascular anatomy. Multidetector CT imaging of the abdomen and pelvis was performed using the standard protocol during bolus administration of intravenous contrast. CONTRAST:  126mL ISOVUE-370 IOPAMIDOL (ISOVUE-370) INJECTION 76% COMPARISON:  None. FINDINGS: CTA CHEST FINDINGS Cardiovascular: There is incomplete coverage of the lower chest such that lower lobe segmental and subsegmental branches are incompletely covered. Reportedly this coverage is related to patient movement after scout acquisition. No evidence of pulmonary embolism. Cardiomegaly without pericardial effusion. The heart is incompletely covered. Limited aortic opacification. Mediastinum/Nodes: Negative for thoracic adenopathy. There is bilateral axillary adenopathy that is mild and likely related to patient's anasarca. Lungs/Pleura: Most convincing on coronal reformats there is interlobular septal thickening that is mild. No alveolar edema or pneumonia. Musculoskeletal: No acute finding. Review of the MIP images confirms the above findings. CT ABDOMEN and PELVIS FINDINGS Hepatobiliary: Heterogeneous enhancement the liver that resolves on delayed phase, with "nutmeg" appearance, likely passive congestion. There is no definitive changes of cirrhosis. No evidence of biliary obstruction or stone. Pancreas: Unremarkable. Spleen: Unremarkable. Adrenals/Urinary Tract: Negative adrenals. No hydronephrosis or stone. Unremarkable bladder. Stomach/Bowel:  No obstruction. No inflammatory changes. Vascular/Lymphatic: Atherosclerotic calcification. No acute vascular finding. The portal venous system is patent. No mass or adenopathy. Reproductive:Hysterectomy Other: Massive ascites. Mild reticulation of the greater omentum is likely edema. There is no peritoneal nodularity or septation. Musculoskeletal: Degenerative changes without acute or aggressive finding Please note that axial  portal venous phase images are only through a partial volume such that some of the abdominal structures have to be reviewed on reformatted images. Image repair is still underway; the study will be signed for expeditious patient care. Review of the MIP images confirms the above findings. IMPRESSION: 1. Massive ascites and generalized anasarca. Heterogeneous liver likely from passive congestion and a cardiac cause is favored. 2. Cardiomegaly. 3. Minimal interstitial pulmonary edema. 4. Negative for pulmonary embolism. Note that the lower lobe distal segmental and subsegmental vessels are incompletely covered due to patient motion. Electronically Signed   By: Monte Fantasia M.D.   On: 05/01/2018 15:25    Procedures Procedures (including critical care time)  Medications Ordered in ED Medications  iopamidol (ISOVUE-370) 76 % injection (has no administration in time range)  iopamidol (ISOVUE-370) 76 % injection 100 mL (has no administration in time range)  magic mouthwash (has no administration in time range)  triamcinolone cream (KENALOG) 0.1 % 1 application (has no administration in time range)  insulin aspart (novoLOG) injection 0-9 Units (has no administration in time range)  labetalol (NORMODYNE,TRANDATE) injection 10 mg (has no administration in time range)  albumin human 25 % solution 25 g (has no administration in time range)  furosemide (LASIX) tablet 20 mg (has no administration in time range)  gabapentin (NEURONTIN) tablet 300 mg (has no administration in time range)  lidocaine (PF) (XYLOCAINE) 1 % injection (has no administration in time range)  enoxaparin (LOVENOX) injection 40 mg (has no administration in time range)  furosemide (LASIX) injection 20 mg (20 mg Intravenous Given 05/01/18 1454)  potassium chloride SA (K-DUR,KLOR-CON) CR tablet 40 mEq (40 mEq Oral Given 05/01/18 1454)  iopamidol (ISOVUE-370) 76 % injection 100 mL (100 mLs Intravenous Contrast Given 05/01/18 1415)      Initial Impression / Assessment and Plan / ED Course  I have reviewed the triage vital signs and the nursing notes.  Pertinent labs & imaging results that were available during my care of the patient were reviewed by me and considered in my medical decision making (see chart for details).     Patient presenting with bilateral lower extremity edema, abdominal distention, rash, weight loss.  She is afebrile, mildly tachycardic in the ED.  Somewhat tachypneic.  Lab work significant for elevated BNP of around 1500, mild anemia, elevated total bilirubin and decreased albumin.  Imaging significant for massive ascites and generalized anasarca, cardiomegaly interstitial pulmonary edema, no evidence of PE.  Suspect edema related to underlying cardiac disease and possible cirrhosis of the liver.  IV Lasix given in the ED and supplemental O2 given for comfort which patient tolerated without difficulty. Spoke with Dr. Nevada Crane with [facility] hospitalist service who agrees to assume care of patient and bring her into the hospital for further evaluation and management.  Final Clinical Impressions(s) / ED Diagnoses   Final diagnoses:  Anasarca  Rash and nonspecific skin eruption  Acute on chronic diastolic congestive heart failure [facility])    ED Discharge Orders    None       Debroah Baller 05/01/18 1956    Valarie Merino, MD 05/03/18 1553

## 2018-05-01 NOTE — ED Notes (Signed)
Pt here today because her sisiter surprised her and brought her to the er, PT HAS SWOLLEN FEET , AFFECT IS DULL AND  ABD  IS TIGHT AND DISTENDED, PT IS SOB , per sisiter pt has not been feel ing well since august , which was last time she was at the dr

## 2018-05-01 NOTE — ED Notes (Signed)
Attempted report x1. Left call back number with Alinda Sierras, RN

## 2018-05-01 NOTE — ED Notes (Signed)
Pt in ct 

## 2018-05-01 NOTE — ED Notes (Signed)
Placed the patient on a purwick.

## 2018-05-02 ENCOUNTER — Inpatient Hospital Stay (HOSPITAL_COMMUNITY): Payer: Medicare Other

## 2018-05-02 DIAGNOSIS — I10 Essential (primary) hypertension: Secondary | ICD-10-CM

## 2018-05-02 DIAGNOSIS — I5030 Unspecified diastolic (congestive) heart failure: Secondary | ICD-10-CM

## 2018-05-02 DIAGNOSIS — I5033 Acute on chronic diastolic (congestive) heart failure: Secondary | ICD-10-CM

## 2018-05-02 DIAGNOSIS — E8779 Other fluid overload: Secondary | ICD-10-CM

## 2018-05-02 DIAGNOSIS — I5023 Acute on chronic systolic (congestive) heart failure: Secondary | ICD-10-CM

## 2018-05-02 LAB — CBC WITH DIFFERENTIAL/PLATELET
Abs Immature Granulocytes: 0.03 10*3/uL (ref 0.00–0.07)
Basophils Absolute: 0.1 10*3/uL (ref 0.0–0.1)
Basophils Relative: 1 %
Eosinophils Absolute: 0.3 10*3/uL (ref 0.0–0.5)
Eosinophils Relative: 4 %
HCT: 30.3 % — ABNORMAL LOW (ref 36.0–46.0)
Hemoglobin: 9.7 g/dL — ABNORMAL LOW (ref 12.0–15.0)
Immature Granulocytes: 0 %
Lymphocytes Relative: 7 %
Lymphs Abs: 0.5 10*3/uL — ABNORMAL LOW (ref 0.7–4.0)
MCH: 28.7 pg (ref 26.0–34.0)
MCHC: 32 g/dL (ref 30.0–36.0)
MCV: 89.6 fL (ref 80.0–100.0)
Monocytes Absolute: 0.4 10*3/uL (ref 0.1–1.0)
Monocytes Relative: 6 %
Neutro Abs: 6 10*3/uL (ref 1.7–7.7)
Neutrophils Relative %: 82 %
Platelets: 233 10*3/uL (ref 150–400)
RBC: 3.38 MIL/uL — ABNORMAL LOW (ref 3.87–5.11)
RDW: 14.6 % (ref 11.5–15.5)
WBC: 7.3 10*3/uL (ref 4.0–10.5)
nRBC: 0 % (ref 0.0–0.2)

## 2018-05-02 LAB — ECHOCARDIOGRAM COMPLETE
Height: 66 in
Weight: 3334.4 oz

## 2018-05-02 LAB — BASIC METABOLIC PANEL
Anion gap: 7 (ref 5–15)
BUN: <5 mg/dL — ABNORMAL LOW (ref 8–23)
CO2: 22 mmol/L (ref 22–32)
Calcium: 8.3 mg/dL — ABNORMAL LOW (ref 8.9–10.3)
Chloride: 108 mmol/L (ref 98–111)
Creatinine, Ser: 0.88 mg/dL (ref 0.44–1.00)
GFR calc Af Amer: >60 mL/min (ref >60)
GFR calc non Af Amer: >60 mL/min (ref >60)
Glucose, Bld: 141 mg/dL — ABNORMAL HIGH (ref 70–99)
Potassium: 3 mmol/L — ABNORMAL LOW (ref 3.5–5.1)
Sodium: 137 mmol/L (ref 135–145)

## 2018-05-02 LAB — RAPID URINE DRUG SCREEN, HOSP PERFORMED
Amphetamines: NOT DETECTED
Barbiturates: NOT DETECTED
Benzodiazepines: NOT DETECTED
Cocaine: NOT DETECTED
Opiates: NOT DETECTED
Tetrahydrocannabinol: NOT DETECTED

## 2018-05-02 LAB — BRAIN NATRIURETIC PEPTIDE
B Natriuretic Peptide: 1504.5 pg/mL — ABNORMAL HIGH (ref 0.0–100.0)
B Natriuretic Peptide: 1255.5 pg/mL — ABNORMAL HIGH (ref 0.0–100.0)

## 2018-05-02 LAB — HIV ANTIBODY (ROUTINE TESTING W REFLEX): HIV Screen 4th Generation wRfx: NONREACTIVE

## 2018-05-02 LAB — MAGNESIUM: Magnesium: 1.6 mg/dL — ABNORMAL LOW (ref 1.7–2.4)

## 2018-05-02 LAB — GLUCOSE, CAPILLARY: Glucose-Capillary: 126 mg/dL — ABNORMAL HIGH (ref 70–99)

## 2018-05-02 MED ORDER — ADULT MULTIVITAMIN W/MINERALS CH
1.0000 | ORAL_TABLET | Freq: Every day | ORAL | Status: DC
  Administered 2018-05-02 – 2018-05-11 (×10): 1 via ORAL
  Filled 2018-05-02 (×10): qty 1

## 2018-05-02 MED ORDER — LACTULOSE 10 GM/15ML PO SOLN
30.0000 g | Freq: Every day | ORAL | Status: DC
  Administered 2018-05-02 – 2018-05-11 (×8): 30 g via ORAL
  Filled 2018-05-02 (×11): qty 45

## 2018-05-02 MED ORDER — GLUCERNA SHAKE PO LIQD
237.0000 mL | Freq: Three times a day (TID) | ORAL | Status: DC
  Administered 2018-05-06 – 2018-05-09 (×2): 237 mL via ORAL

## 2018-05-02 MED ORDER — DIPHENHYDRAMINE HCL 25 MG PO CAPS: 50.0000 mg | ORAL_CAPSULE | Freq: Four times a day (QID) | ORAL | Status: DC | PRN

## 2018-05-02 MED ORDER — POTASSIUM CHLORIDE CRYS ER 20 MEQ PO TBCR
40.0000 meq | EXTENDED_RELEASE_TABLET | Freq: Three times a day (TID) | ORAL | Status: AC
  Administered 2018-05-02 (×3): 40 meq via ORAL
  Filled 2018-05-02 (×3): qty 2

## 2018-05-02 MED ORDER — MAGNESIUM SULFATE 2 GM/50ML IV SOLN
2.0000 g | Freq: Once | INTRAVENOUS | Status: AC
  Administered 2018-05-02: 2 g via INTRAVENOUS
  Filled 2018-05-02: qty 50

## 2018-05-02 MED ORDER — DIPHENHYDRAMINE HCL 25 MG PO CAPS
50.0000 mg | ORAL_CAPSULE | Freq: Three times a day (TID) | ORAL | Status: DC | PRN
  Administered 2018-05-02 – 2018-05-07 (×4): 50 mg via ORAL
  Filled 2018-05-02 (×4): qty 2

## 2018-05-02 MED ORDER — LOSARTAN POTASSIUM 25 MG PO TABS
25.0000 mg | ORAL_TABLET | Freq: Every day | ORAL | Status: DC
  Administered 2018-05-02 – 2018-05-07 (×6): 25 mg via ORAL
  Filled 2018-05-02 (×6): qty 1

## 2018-05-02 MED ORDER — FUROSEMIDE 10 MG/ML IJ SOLN
40.0000 mg | Freq: Every day | INTRAMUSCULAR | Status: DC
  Administered 2018-05-02 – 2018-05-06 (×5): 40 mg via INTRAVENOUS
  Filled 2018-05-02 (×5): qty 4

## 2018-05-02 MED ORDER — DIPHENHYDRAMINE-ZINC ACETATE 2-0.1 % EX CREA
TOPICAL_CREAM | Freq: Three times a day (TID) | CUTANEOUS | Status: DC | PRN
  Administered 2018-05-02: 1 via TOPICAL
  Administered 2018-05-02: 3 via TOPICAL
  Filled 2018-05-02: qty 28

## 2018-05-02 MED ORDER — ONDANSETRON HCL 4 MG/2ML IJ SOLN
4.0000 mg | Freq: Four times a day (QID) | INTRAMUSCULAR | Status: DC | PRN
  Administered 2018-05-02: 4 mg via INTRAVENOUS
  Filled 2018-05-02 (×3): qty 2

## 2018-05-02 NOTE — Consult Note (Signed)
Cardiology Consultation:   Patient ID: Gloria [name] MRN: [id number]; DOB: [birth year]  Admit date: 05/01/2018 Date of Consult: 05/02/2018  Primary Care Provider: Biagio Borg, MD Primary Cardiologist:  Oval Linsey   Patient Profile:   Gloria [name] is a 67 y.o. female with a hx of mild HFrEF, HLD, HTN, DM, depression, who was admitted with dyspnea who is being seen today for the evaluation of newly reduced EF at the request of Dr. Nevada Crane.  History of Present Illness:   BETHANNIE IGLEHART is a 67 y.o. female with a hx of mild HFrEF, HLD, HTN, DM, depression, who was admitted with dyspnea who is being seen today for the evaluation of newly reduced EF.   The patient follows in clinic with Dr. Oval Linsey, with heart failure with mild systolic dysfunction, although with tendency for hypervolemia. She was last seen in November 2018. At that visit, she was doing well without any cardiac symptoms. Her carvedilol was increased for elevated BP (although it looks like she is no longer taking this medication).   Ms. Wirsing was admitted to the hospital yesterday with two months of progressive dyspnea and increased abdominal girth. She had no chest pain or palpitations. Troponin was negative, and BNP elevated at 1504. ECG showed sinus tachycardia with nonspecific ST changes. She underwent RUQ [place] that showed cirrhosis, and she underwent LVP with removal of 6L ascites. Echocardiogram was performed and showed EF 20-25%, down from 50-55% in 2017. Cardiology was consulted for further recommendations.  On my evaluation, the patient is resting in bed and denies any active symptoms; she feels better after the paracentesis. She reports that she has had several months of progressive dyspnea and edema in her abdomen and legs. She denies any chest pain.   Past Medical History:  Diagnosis Date  . Allergic rhinitis, cause unspecified 03/30/2013  . Anxiety state, unspecified   . Arthritis    "knees" (05/01/2018)  . Asthma    hx  (05/01/2018)  . CHF (congestive heart failure) ([place])   . Chronic bronchitis   . Depressive disorder, not elsewhere classified   . Difficulty waking    hard to wake up past sedation!  . Migraine, unspecified, without mention of intractable migraine without mention of status migrainosus   . OSA (obstructive sleep apnea) 12/25/2016  . Other and unspecified hyperlipidemia   . Panic attacks   . Postmenopausal symptoms   . Type 2 diabetes, diet controlled ([place])   . Unspecified essential hypertension   . Unspecified urinary incontinence   . UTI (lower urinary tract infection)    on 02/05/16/on meds    Past Surgical History:  Procedure Laterality Date  . ABDOMINAL HYSTERECTOMY  2002  . DIAGNOSTIC LAPAROSCOPY    . OPEN REDUCTION SHOULDER DISLOCATION Right   . TUBAL LIGATION    . TUMOR EXCISION Right    "ankle"     Home Medications:  Prior to Admission medications   Medication Sig Start Date End Date Taking? Authorizing Provider  furosemide (LASIX) 20 MG tablet Take 1 tablet (20 mg total) by mouth daily. 02/25/18  Yes Biagio Borg, MD  magic mouthwash SOLN Take 5 mLs by mouth 3 (three) times daily as needed for mouth pain. 02/25/18  Yes Biagio Borg, MD  triamcinolone cream (KENALOG) 0.1 % Apply 1 application topically 2 (two) times daily. Patient taking differently: Apply 1 application topically 2 (two) times daily as needed (rash).  02/25/18 02/25/19 Yes Biagio Borg, MD  glucose blood (ACCU-CHEK  AVIVA) test strip Use as instructed four times per day 08/26/17   Philemon Kingdom, MD  Insulin Glargine (LANTUS SOLOSTAR) 100 UNIT/ML Solostar Pen Inject 24 Units into the skin daily. Patient not taking: Reported on 05/01/2018 08/26/17   Philemon Kingdom, MD  Insulin Pen Needle (BD PEN NEEDLE NANO U/F) 32G X 4 MM MISC USE ONCE DAILY 08/26/17   Philemon Kingdom, MD  irbesartan (AVAPRO) 150 MG tablet Take 150 mg by mouth daily.    [provider]  irbesartan (AVAPRO) 300 MG tablet Take 1  tablet (300 mg total) by mouth daily. Patient not taking: Reported on 05/01/2018 11/06/16   Biagio Borg, MD  metFORMIN (GLUCOPHAGE-XR) 500 MG 24 hr tablet Take 1 tablet (500 mg total) by mouth 2 (two) times daily with a meal. Patient not taking: Reported on 05/01/2018 08/26/17   Philemon Kingdom, MD  potassium chloride (K-DUR,KLOR-CON) 10 MEQ tablet Take 10 mEq by mouth daily.    [provider]    Inpatient Medications: Scheduled Meds: . enoxaparin (LOVENOX) injection  40 mg Subcutaneous Q24H  . feeding supplement (GLUCERNA SHAKE)  237 mL Oral TID BM  . furosemide  40 mg Intravenous Daily  . gabapentin  300 mg Oral BID  . lactulose  30 g Oral Daily  . losartan  25 mg Oral Daily  . multivitamin with minerals  1 tablet Oral Daily   Continuous Infusions:  PRN Meds: diphenhydrAMINE, iopamidol, labetalol, ondansetron (ZOFRAN) IV, triamcinolone cream  Allergies:    Allergies  Allergen Reactions  . Compazine Other (See Comments)    Stroke like symptoms  . Haloperidol Decanoate Other (See Comments)    Extrapyramidal syndrome  . Penicillins Nausea And Vomiting  . Glipizide Other (See Comments)    dyspnea  . Metformin And Related Other (See Comments)    GI upset  . Codeine Itching  . Lisinopril Cough    Social History:   Social History   Socioeconomic History  . Marital status: Divorced    Spouse name: Not on file  . Number of children: 1  . Years of education: Not on file  . Highest education level: Not on file  Occupational History  . Occupation: RETIRED ---Self employed-interior design/flower arrangements  Social Needs  . Financial resource strain: Not on file  . Food insecurity:    Worry: Not on file    Inability: Not on file  . Transportation needs:    Medical: Not on file    Non-medical: Not on file  Tobacco Use  . Smoking status: Never Smoker  . Smokeless tobacco: Never Used  Substance and Sexual Activity  . Alcohol use: Never    Alcohol/week: 0.0  standard drinks    Frequency: Never  . Drug use: Never  . Sexual activity: Not Currently  Lifestyle  . Physical activity:    Days per week: Not on file    Minutes per session: Not on file  . Stress: Not on file  Relationships  . Social connections:    Talks on phone: Not on file    Gets together: Not on file    Attends religious service: Not on file    Active member of club or organization: Not on file    Attends meetings of clubs or organizations: Not on file    Relationship status: Not on file  . Intimate partner violence:    Fear of current or ex partner: Not on file    Emotionally abused: Not on file  Physically abused: Not on file    Forced sexual activity: Not on file  Other Topics Concern  . Not on file  Social History Narrative   -Retired - does Company secretary   -Divorced in 2007   -One son - lives locally. 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 and entertain family. Bowling. Interior decorating       Family History:     Family History  Problem Relation Age of Onset  . Arthritis Unknown        family history  . Hypertension Unknown        grandparent  . Colon cancer Maternal Grandfather   . Asthma Son   . Rheum arthritis Mother   . Hypertension Mother   . Diabetes Maternal Uncle   . Diabetes Paternal Uncle   . Diabetes Maternal Grandmother      ROS:  Please see the history of present illness.  All other ROS reviewed and negative.     Physical Exam/Data:   Vitals:   05/02/18 0328 05/02/18 0800 05/02/18 1234 05/02/18 1946  BP: 133/82 123/74 111/76 121/77  Pulse: (!) 104 100 99 (!) 101  Resp: 18  20 18   Temp: 98.5 F (36.9 C) 98.8 F (37.1 C) 98.1 F (36.7 C) 98.8 F (37.1 C)  TempSrc: Oral Oral Oral Oral  SpO2: 97% 96% 94% 93%  Weight: 94.5 kg     Height:        Intake/Output Summary (Last 24 hours) at 05/02/2018 1950 Last data filed at 05/02/2018 1700 Gross per 24 hour  Intake 1360 ml  Output 2801  ml  Net -1441 ml   Filed Weights   05/01/18 1835 05/02/18 0328  Weight: 94.8 kg 94.5 kg   Body mass index is 33.64 kg/m.  General:  Well nourished, well developed, in no acute distress  HEENT: normal Neck: JVD at jaw when nearly upright Cardiac:  normal S1, S2; RRR; no murmur  Lungs:  clear to auscultation bilaterally, no wheezing, rhonchi or rales  Abd: soft, nontender, moderately distended Ext: 2+ pitting LE edema Musculoskeletal:  No deformities, BUE and BLE strength normal and equal Skin: warm and dry  Neuro:  No focal abnormalities noted Psych:  Flat affect   EKG:  The EKG was personally reviewed and demonstrates:  Sinus tachycardia with nonspecific ST changes Telemetry:  Telemetry was personally reviewed and demonstrates:  No events  Relevant CV Studies: Lexiscan Myoview 05/17/16:   The left ventricular ejection fraction is mildly decreased (45-54%).  Nuclear stress EF: 45%.  Defect 1: There is a medium defect of mild severity present in the mid anterior, mid anteroseptal and apical anterior location. This appears most consistent with shifting breast artifact.  This is a low risk study.   Echo 03/21/16: Study Conclusions  - Left ventricle: Global longitudinal strain is -14.3% The cavity size was mildly dilated. Wall thickness was normal. Systolic function was normal. The estimated ejection fraction was in the range of 50%. Features are consistent with a pseudonormal left ventricular filling pattern, with concomitant abnormal relaxation and increased filling pressure (grade 2 diastolic dysfunction). - Mitral valve: There was mild regurgitation. - Left atrium: The atrium was mildly dilated. Impressions: - Would recomm limited echo with Definity use to further evaluate regional wall motion.  Echo 05/02/2018: Study Conclusions  - Left ventricle: The cavity size was mildly dilated. Wall   thickness was increased in a pattern of mild  LVH.  Systolic   function was severely reduced. The estimated ejection fraction   was in the range of 20% to 25%. Diffuse hypokinesis. Doppler   parameters are consistent with restrictive left ventricular   relaxation (grade 3 diastolic dysfunction). The E/e&' ratio is   >15, suggesting elevated LV filling pressure. - Mitral valve: Mildly thickened leaflets . There was moderate   regurgitation. - Left atrium: The atrium was mildly dilated. - Right ventricle: The cavity size was mildly dilated. Mildly   reduced systolic dysfunction. - Tricuspid valve: There was moderate regurgitation. - Pulmonic valve: There was mild regurgitation. - Pulmonary arteries: PA peak pressure: 44 mm Hg (S). - Inferior vena cava: The vessel was dilated. The respirophasic   diameter changes were blunted (< 50%), consistent with elevated   central venous pressure. - Pericardium, extracardiac: A trivial pericardial effusion was   identified. Impressions: - Compared to a prior study in 2017, the LVEF is severely reduced   to 20-25%.  Laboratory Data:  Chemistry Recent Labs  Lab 05/01/18 1249 05/02/18 0613  NA 136 137  K 2.9* 3.0*  CL 103 108  CO2 26 22  GLUCOSE 137* 141*  BUN <5* <5*  CREATININE 0.89 0.88  CALCIUM 8.9 8.3*  GFRNONAA >60 >60  GFRAA >60 >60  ANIONGAP 7 7    Recent Labs  Lab 05/01/18 1249  PROT 8.1  ALBUMIN 3.0*  AST 22  ALT 9  ALKPHOS 99  BILITOT 2.1*   Hematology Recent Labs  Lab 05/01/18 1249 05/02/18 0613  WBC 8.0 7.3  RBC 3.92 3.38*  HGB 11.2* 9.7*  HCT 35.5* 30.3*  MCV 90.6 89.6  MCH 28.6 28.7  MCHC 31.5 32.0  RDW 14.6 14.6  PLT 282 233   Cardiac EnzymesNo results for input(s): TROPONINI in the last 168 hours.  Recent Labs  Lab 05/01/18 1326  TROPIPOC 0.00    BNP Recent Labs  Lab 05/01/18 1247 05/01/18 2206 05/02/18 0614  BNP 1,514.4* 1,504.5* 1,255.5*    DDimer No results for input(s): DDIMER in the last 168 hours.  Radiology/Studies:  Dg Chest 2  View  Result Date: 05/01/2018 CLINICAL DATA:  Shortness of breath for 1 month EXAM: CHEST - 2 VIEW COMPARISON:  02/25/2018 FINDINGS: Cardiac shadow is enlarged. The overall inspiratory effort is poor without focal infiltrate or sizable effusion. Degenerative changes of thoracic spine are noted. IMPRESSION: Poor inspiratory effort.  No acute abnormality seen. Electronically Signed   By: Inez Catalina M.D.   On: 05/01/2018 14:41   Ct Angio Chest Pe W And/or Wo Contrast  Result Date: 05/01/2018 CLINICAL DATA:  Abdominal pain and swelling EXAM: CT ANGIOGRAPHY CHEST CT ABDOMEN AND PELVIS WITH CONTRAST TECHNIQUE: Multidetector CT imaging of the chest was performed using the standard protocol during bolus administration of intravenous contrast. Multiplanar CT image reconstructions and MIPs were obtained to evaluate the vascular anatomy. Multidetector CT imaging of the abdomen and pelvis was performed using the standard protocol during bolus administration of intravenous contrast. CONTRAST:  113mL ISOVUE-370 IOPAMIDOL (ISOVUE-370) INJECTION 76% COMPARISON:  None. FINDINGS: CTA CHEST FINDINGS Cardiovascular: There is incomplete coverage of the lower chest such that lower lobe segmental and subsegmental branches are incompletely covered. Reportedly this coverage is related to patient movement after scout acquisition. No evidence of pulmonary embolism. Cardiomegaly without pericardial effusion. The heart is incompletely covered. Limited aortic opacification. Mediastinum/Nodes: Negative for thoracic adenopathy. There is bilateral axillary adenopathy that is mild and likely related to patient's anasarca. Lungs/Pleura: Most convincing  on coronal reformats there is interlobular septal thickening that is mild. No alveolar edema or pneumonia. Musculoskeletal: No acute finding. Review of the MIP images confirms the above findings. CT ABDOMEN and PELVIS FINDINGS Hepatobiliary: Heterogeneous enhancement the liver that resolves on  delayed phase, with "nutmeg" appearance, likely passive congestion. There is no definitive changes of cirrhosis. No evidence of biliary obstruction or stone. Pancreas: Unremarkable. Spleen: Unremarkable. Adrenals/Urinary Tract: Negative adrenals. No hydronephrosis or stone. Unremarkable bladder. Stomach/Bowel:  No obstruction. No inflammatory changes. Vascular/Lymphatic: Atherosclerotic calcification. No acute vascular finding. The portal venous system is patent. No mass or adenopathy. Reproductive:Hysterectomy Other: Massive ascites. Mild reticulation of the greater omentum is likely edema. There is no peritoneal nodularity or septation. Musculoskeletal: Degenerative changes without acute or aggressive finding Please note that axial portal venous phase images are only through a partial volume such that some of the abdominal structures have to be reviewed on reformatted images. Image repair is still underway; the study will be signed for expeditious patient care. Review of the MIP images confirms the above findings. IMPRESSION: 1. Massive ascites and generalized anasarca. Heterogeneous liver likely from passive congestion and a cardiac cause is favored. 2. Cardiomegaly. 3. Minimal interstitial pulmonary edema. 4. Negative for pulmonary embolism. Note that the lower lobe distal segmental and subsegmental vessels are incompletely covered due to patient motion. Electronically Signed   By: Monte Fantasia M.D.   On: 05/01/2018 15:25   US Abdomen Complete  Result Date: 05/01/2018 CLINICAL DATA:  67 y/o  F; cirrhosis with ascites. EXAM: ABDOMEN ULTRASOUND COMPLETE COMPARISON:  05/01/2018 CT abdomen and pelvis. FINDINGS: Gallbladder: Gallbladder sludge and small stones. No gallbladder wall thickening. Negative sonographic Murphy's sign. Common bile duct: Diameter: 3 mm Liver: Diffuse heterogeneity of the liver compatible with cirrhosis. No focal liver abnormality identified. Portal vein is patent on color Doppler  imaging with normal direction of blood flow towards the liver. IVC: No abnormality visualized. Pancreas: Visualized portion unremarkable. Spleen: Size and appearance within normal limits. Right Kidney: Length: 10.3 cm. Echogenicity within normal limits. No mass or hydronephrosis visualized. Left Kidney: Length: 9.5 cm. Echogenicity within normal limits. No mass or hydronephrosis visualized. Abdominal aorta: No aneurysm visualized. Other findings: Perihepatic ascites. IMPRESSION: 1. Gallbladder sludge and small stones. 2. Cirrhotic liver. 3. Perihepatic ascites. 4. No acute process identified. Electronically Signed   By: Kristine Garbe M.D.   On: 05/01/2018 18:18   Ct Abdomen Pelvis W Contrast  Result Date: 05/01/2018 CLINICAL DATA:  Abdominal pain and swelling EXAM: CT ANGIOGRAPHY CHEST CT ABDOMEN AND PELVIS WITH CONTRAST TECHNIQUE: Multidetector CT imaging of the chest was performed using the standard protocol during bolus administration of intravenous contrast. Multiplanar CT image reconstructions and MIPs were obtained to evaluate the vascular anatomy. Multidetector CT imaging of the abdomen and pelvis was performed using the standard protocol during bolus administration of intravenous contrast. CONTRAST:  112mL ISOVUE-370 IOPAMIDOL (ISOVUE-370) INJECTION 76% COMPARISON:  None. FINDINGS: CTA CHEST FINDINGS Cardiovascular: There is incomplete coverage of the lower chest such that lower lobe segmental and subsegmental branches are incompletely covered. Reportedly this coverage is related to patient movement after scout acquisition. No evidence of pulmonary embolism. Cardiomegaly without pericardial effusion. The heart is incompletely covered. Limited aortic opacification. Mediastinum/Nodes: Negative for thoracic adenopathy. There is bilateral axillary adenopathy that is mild and likely related to patient's anasarca. Lungs/Pleura: Most convincing on coronal reformats there is interlobular septal  thickening that is mild. No alveolar edema or pneumonia. Musculoskeletal: No acute finding. Review of the  MIP images confirms the above findings. CT ABDOMEN and PELVIS FINDINGS Hepatobiliary: Heterogeneous enhancement the liver that resolves on delayed phase, with "nutmeg" appearance, likely passive congestion. There is no definitive changes of cirrhosis. No evidence of biliary obstruction or stone. Pancreas: Unremarkable. Spleen: Unremarkable. Adrenals/Urinary Tract: Negative adrenals. No hydronephrosis or stone. Unremarkable bladder. Stomach/Bowel:  No obstruction. No inflammatory changes. Vascular/Lymphatic: Atherosclerotic calcification. No acute vascular finding. The portal venous system is patent. No mass or adenopathy. Reproductive:Hysterectomy Other: Massive ascites. Mild reticulation of the greater omentum is likely edema. There is no peritoneal nodularity or septation. Musculoskeletal: Degenerative changes without acute or aggressive finding Please note that axial portal venous phase images are only through a partial volume such that some of the abdominal structures have to be reviewed on reformatted images. Image repair is still underway; the study will be signed for expeditious patient care. Review of the MIP images confirms the above findings. IMPRESSION: 1. Massive ascites and generalized anasarca. Heterogeneous liver likely from passive congestion and a cardiac cause is favored. 2. Cardiomegaly. 3. Minimal interstitial pulmonary edema. 4. Negative for pulmonary embolism. Note that the lower lobe distal segmental and subsegmental vessels are incompletely covered due to patient motion. Electronically Signed   By: Monte Fantasia M.D.   On: 05/01/2018 15:25   US Paracentesis  Result Date: 05/02/2018 INDICATION: Shortness of breath. Abdominal distention. Ascites. Request for diagnostic and therapeutic paracentesis. EXAM: ULTRASOUND GUIDED LEFT LOWER QUADRANT PARACENTESIS MEDICATIONS: None.  COMPLICATIONS: None immediate. PROCEDURE: Informed written consent was obtained from the patient after a discussion of the risks, benefits and alternatives to treatment. A timeout was performed prior to the initiation of the procedure. Initial ultrasound scanning demonstrates a large amount of ascites within the left lower abdominal quadrant. The left lower abdomen was prepped and draped in the usual sterile fashion. 1% lidocaine with epinephrine was used for local anesthesia. Following this, a 19 gauge, 7-cm, Yueh catheter was introduced. An ultrasound image was saved for documentation purposes. The paracentesis was performed. The catheter was removed and a dressing was applied. The patient tolerated the procedure well without immediate post procedural complication. FINDINGS: A total of approximately 6 L of clear yellow fluid was removed. Samples were sent to the laboratory as requested by the clinical team. IMPRESSION: Successful ultrasound-guided paracentesis yielding 6 liters of peritoneal fluid. Read by: Ascencion Dike PA-C Electronically Signed   By: Corrie Mckusick D.O.   On: 05/01/2018 17:41    Assessment and Plan:   HFrEF Cirrhosis The patient has a history of mildly reduced systolic function with prior hypervolemia, although she was euvolemic at her last clinic appointment. She presents to the hospital with significant volume overload with LE edema and abdominal ascites s/p removal of 6 L fluid via paracentesis. Workup is notable for significant reduction in EF to 25%. She is also found to have new cirrhosis, which may be due to congestion from HF. The cause of her worsening HF is not clear: prior nuclear stress test was low risk, which makes NICM likely (although she may benefit from repeat ischemic evaluation). She has no known arrhythmias, HIV is negative, iron panel was not elevated when last checked. At this time, will need ongoing diuresis and initiation of OMT. -Continue diuresis with IV  furosemide. May benefit from BID dosing, but it is reasonable to diurese slowly given massive volume removal today. -Will start low dose losartan (was previously prescribed irbesartan). May ultimately transition to [facility]. -Can restart BB in next 1-2 days once more  euvolemic -May benefit from spironolactone, hydralazine/nitrates in the future -Lipid panel reasonable in August -Consider repeat ischemic evaluation, likely as outpatient -Continue electrolyte repletion  Hypertension -Continue titration of HF medications as above  Diabetes HgA1c 5.2 yesterday -Given her heart failure, she may be a good candidate for an SGLT2 inhibitor such as Januvia.      For questions or updates, please contact [facility] Please consult [email or web address] for contact info under     Signed, Nila Nephew, MD  05/02/2018 7:50 PM

## 2018-05-02 NOTE — Progress Notes (Signed)
Patient refusing sliding scale insulin. States she does not take insulin at home. RN educated patient. Patient verbalized understanding of education and still refuses.

## 2018-05-02 NOTE — Progress Notes (Signed)
PT Cancellation Note  Patient Details Name: Gloria [name] MRN: [id number] DOB: 09/16/1950   Cancelled Treatment:    Reason Eval/Treat Not Completed: Medical issues which prohibited therapy per RN pt nauseated with emesis. Will hold for now and cont to follow.   Reinaldo Berber, PT, DPT Acute Rehabilitation Services Pager: [phone] Office: [phone]     Reinaldo Berber 05/02/2018, 7:21 PM

## 2018-05-02 NOTE — Progress Notes (Signed)
Initial Nutrition Assessment  DOCUMENTATION CODES:   Obesity unspecified  INTERVENTION:   Glucerna Shake po TID, each supplement provides 220 kcal and 10 grams of protein  MVI daily  Liberalize diet as a heart healthy diet restricts protein   NUTRITION DIAGNOSIS:   Inadequate oral intake related to acute illness as evidenced by per patient/family report.  GOAL:   Patient will meet greater than or equal to 90% of their needs  MONITOR:   PO intake, Supplement acceptance, Labs, Weight trends, Skin, I & O's  REASON FOR ASSESSMENT:   Malnutrition Screening Tool    ASSESSMENT:   67 y.o. female with medical history significant for chronic diastolic CHF, hypertension, asthma not treated, OSA noncompliant with CPAP, type 2 diabetes, polyneuropathy, hyperlipidemia, who presented to ED [facility] brought in by her sister and brother-in-law due to worsening shortness of breath, BLE swelling and ascites    Pt s/p paracentesis with 6L fluid removal on 11/1  Met with pt and family in room today. Pt reports poor appetite and oral intake for several months pta. Pt reports taste changes and repots that she has no desire for food. Pt reports eating about 50% of her breaktfast this morning. Pt does not drink supplements at home. Pt tried an Ensure this morning but did not like this; reports she will drink supplement if it's butter pecan flavored. Per reports a 20lb weight loss over the past 3 months; it is difficult to determine pt's true weight r/t ascites but pt reports her UBW is over 200lbs. Per chart, it appears pt weighed 95.5kg in February and 99.3kg in June. Family reports that they can tell that pt has lost significant amount of weight. RD will order supplements and liberalize pt's diet as a heart healthy diet is very restrictive and restricts protein. Will change Ensure to Glucerna as this comes in butter pecan flavor. Suspect pt at refeed risk; would recommend monitor K, Mg and P labs daily  until stable.    Medications reviewed and include: lovenox, lasix, lactulose, MVI, KCl  Labs reviewed: K 3.0(L), BUN <5(L) Ammonia 36(H)- 11/1 BNP 1255.5(H) Hgb 9.7(L), Hct 30.3(L) cbgs- 130, 103, 126 x 24 hrs  NUTRITION - FOCUSED PHYSICAL EXAM:    Most Recent Value  Orbital Region  No depletion  Upper Arm Region  No depletion  Thoracic and Lumbar Region  No depletion  Buccal Region  No depletion  Temple Region  No depletion  Clavicle Bone Region  No depletion  Clavicle and Acromion Bone Region  No depletion  Scapular Bone Region  No depletion  Dorsal Hand  No depletion  Patellar Region  Unable to assess  Anterior Thigh Region  Unable to assess  Posterior Calf Region  Unable to assess  Edema (RD Assessment)  Mild [BLE]  Hair  Reviewed  Eyes  Reviewed  Mouth  Reviewed  Skin  Reviewed  Nails  Reviewed     Diet Order:   Diet Order            Diet 2 gram sodium Room service appropriate? Yes; Fluid consistency: Thin  Diet effective now             EDUCATION NEEDS:   Education needs have been addressed  Skin:  Skin Assessment: Reviewed RN Assessment  Last BM:  11/2  Height:   Ht Readings from Last 1 Encounters:  05/01/18 '5\' 6"'  (1.676 m)    Weight:   Wt Readings from Last 1 Encounters:  05/02/18 94.5 kg  Ideal Body Weight:  59 kg  BMI:  Body mass index is 33.64 kg/m.  Estimated Nutritional Needs:   Kcal:  1700-2000kcal/day   Protein:  95-104g/day   Fluid:  per MD  Koleen Distance MS, RD, LDN Pager #- [phone] Office#- [phone] After Hours Pager: [phone]

## 2018-05-02 NOTE — Progress Notes (Signed)
Patient had an episode of emesis at 1825.  Patient had just finished eating her supper, and was visiting with family.  Patient states she is not nauseated and the vomiting came up abruptly.  MD notified via amion.  Patient cleaned up, bed changed.  Prn order noted.  Patient kept comfortable, will be medicated per order. Marcille Blanco, RN

## 2018-05-02 NOTE — Progress Notes (Addendum)
PROGRESS NOTE  KEYMANI GLYNN GGY:[id number] DOB: 1951/04/23 DOA: 05/01/2018 PCP: Biagio Borg, MD  HPI/Recap of past 24 hours: Gloria [name] is a 67 y.o. female with medical history significant for chronic diastolic CHF, hypertension, asthma not treated, OSA noncompliant with CPAP, type 2 diabetes, polyneuropathy, hyperlipidemia, who presented to ED [facility] brought in by her sister and brother-in-law due to worsening shortness of breath at rest.  Patient is alert and oriented x3.  Reports that in the last 2 months she has been having progressive worsening shortness of breath.  Associated with increasing abdominal girth and weight loss more than 20 pounds in the last 2 months.  Did not seek help for this but states that she has a PCP that she sees once a year.  Denies any chest pain or palpitations.  Denies melena, hematochezia or hematemesis.  Denies fever, chills, nausea, abdominal pain, diarrhea or constipation.  05/02/2018: Patient seen and examined at her bedside.  Reports she feels better post paracentesis with 6 L of fluid removed from her abdomen by interventional radiology on 05/01/2018.  Abdomen ultrasound revealed cirrhosis of the liver.  Hepatitis panel pending.  Assessment/Plan: Active Problems:   Fluid overload  Acute on chronic combined systolic and diastolic CHF 2D echo done on 03/21/2016 revealed preserved LVEF 50 to 55% with grade 1 diastolic dysfunction Repeated 2D echo done on 09/03/91 revealed systolic CHF with LVEF 20 to 25% and grade 3 diastolic dysfunction. Cardiology consulted to further assess and to manage newly diagnosed systolic CHF.  Newly diagnosed systolic CHF Findings as stated above Cardiology consulted, Dr Sallyanne Kuster Lasix IV 40 mg daily Last LDL 43 on 02/25/2018 Last A1c 5.2 on 03/31/2018 Last twelve-lead EKG done on 05/02/2018 independently reviewed revealed sinus tach rate of 101  Newly diagnosed cirrhosis of the liver with ascites Findings per abdomen  ultrasound, cirrhosis of the liver Hepatitis panel ordered and pending 6 L of fluid removed by interventional radiologist on 05/01/2018 Continue lactulose Continue IV Lasix Will need to follow-up with GI  Hyperammonemia Continue lactulose 30 g daily  Hypokalemia Potassium 3.0 Repleted with KCl 40 mEq 3 times daily x3 doses Obtain magnesium level Obtain BMP in the morning  Chronic normocytic anemia Sharp in hemoglobin from 11 to 9 possibly dilutional from fluid overload No sign of overt bleeding Continue to monitor closely repeat CBC in the morning       Code Status: Full code  Family Communication: None at bedside  Disposition Plan: Home when hemodynamically stable or when cardiology signs off.   Consultants:  Radiology  Procedures:  Paracentesis by interventional radiology  Antimicrobials:  None  DVT prophylaxis: Subcu Lovenox daily   Objective: Vitals:   05/01/18 2300 05/02/18 0328 05/02/18 0800 05/02/18 1234  BP: 126/72 133/82 123/74 111/76  Pulse: 100 (!) 104 100 99  Resp:  18  20  Temp:  98.5 F (36.9 C) 98.8 F (37.1 C) 98.1 F (36.7 C)  TempSrc:  Oral Oral Oral  SpO2: 98% 97% 96% 94%  Weight:  94.5 kg    Height:        Intake/Output Summary (Last 24 hours) at 05/02/2018 1329 Last data filed at 05/02/2018 0900 Gross per 24 hour  Intake 1000 ml  Output 1451 ml  Net -451 ml   Filed Weights   05/01/18 1835 05/02/18 0328  Weight: 94.8 kg 94.5 kg    Exam:  . General: 67 y.o. year-old female well developed well nourished in no acute distress.  Alert and oriented x3. . Cardiovascular: Regular rate and rhythm with no rubs or gallops.  No thyromegaly or JVD noted.   Marland Kitchen Respiratory: Mild rales at bases.  No wheezes.   . Abdomen: Soft nontender moderately distended with normal bowel sounds x4 quadrants. . Musculoskeletal: 1+ edema in lower extremities bilaterally. 2/4 pulses in all 4 extremities. Marland Kitchen Psychiatry: Mood is appropriate for  condition and setting   Data Reviewed: CBC: Recent Labs  Lab 05/01/18 1249 05/02/18 0613  WBC 8.0 7.3  NEUTROABS  --  6.0  HGB 11.2* 9.7*  HCT 35.5* 30.3*  MCV 90.6 89.6  PLT 282 865   Basic Metabolic Panel: Recent Labs  Lab 05/01/18 1249 05/02/18 0613  NA 136 137  K 2.9* 3.0*  CL 103 108  CO2 26 22  GLUCOSE 137* 141*  BUN <5* <5*  CREATININE 0.89 0.88  CALCIUM 8.9 8.3*   GFR: Estimated Creatinine Clearance: 71.9 mL/min (by C-G formula based on SCr of 0.88 mg/dL). Liver Function Tests: Recent Labs  Lab 05/01/18 1249  AST 22  ALT 9  ALKPHOS 99  BILITOT 2.1*  PROT 8.1  ALBUMIN 3.0*   Recent Labs  Lab 05/01/18 1249  LIPASE 26   Recent Labs  Lab 05/01/18 2206  AMMONIA 36*   Coagulation Profile: Recent Labs  Lab 05/01/18 1253  INR 1.43   Cardiac Enzymes: No results for input(s): CKTOTAL, CKMB, CKMBINDEX, TROPONINI in the last 168 hours. BNP (last 3 results) No results for input(s): PROBNP in the last 8760 hours. HbA1C: Recent Labs    05/01/18 2206  HGBA1C 5.2   CBG: Recent Labs  Lab 05/01/18 2015 05/01/18 2317 05/02/18 0540  GLUCAP 130* 103* 126*   Lipid Profile: No results for input(s): CHOL, HDL, LDLCALC, TRIG, CHOLHDL, LDLDIRECT in the last 72 hours. Thyroid Function Tests: No results for input(s): TSH, T4TOTAL, FREET4, T3FREE, THYROIDAB in the last 72 hours. Anemia Panel: No results for input(s): VITAMINB12, FOLATE, FERRITIN, TIBC, IRON, RETICCTPCT in the last 72 hours. Urine analysis:    Component Value Date/Time   COLORURINE YELLOW 05/01/2018 1249   APPEARANCEUR CLEAR 05/01/2018 1249   LABSPEC 1.009 05/01/2018 1249   PHURINE 7.0 05/01/2018 1249   GLUCOSEU NEGATIVE 05/01/2018 1249   GLUCOSEU NEGATIVE 02/25/2018 1500   HGBUR NEGATIVE 05/01/2018 1249   BILIRUBINUR NEGATIVE 05/01/2018 1249   BILIRUBINUR negatvie 11/04/2016 1312   KETONESUR NEGATIVE 05/01/2018 1249   PROTEINUR 30 (A) 05/01/2018 1249   UROBILINOGEN 0.2  02/25/2018 1500   NITRITE NEGATIVE 05/01/2018 1249   LEUKOCYTESUR NEGATIVE 05/01/2018 1249   Sepsis Labs: @LABRCNTIP (procalcitonin:4,lacticidven:4)  ) Recent Results (from the past 240 hour(s))  Culture, body fluid-bottle     Status: None (Preliminary result)   Collection Time: 05/01/18  5:23 PM  Result Value Ref Range Status   Specimen Description PERITONEAL  Final   Special Requests NONE  Final   Culture   Final    NO GROWTH < 12 HOURS Performed at [facility], [place] [address], [place], [place] [address]    Report Status PENDING  Incomplete  Gram stain     Status: None   Collection Time: 05/01/18  5:23 PM  Result Value Ref Range Status   Specimen Description PERITONEAL  Final   Special Requests NONE  Final   Gram Stain   Final    RARE WBC PRESENT, PREDOMINANTLY PMN NO ORGANISMS SEEN Performed at [facility], [place] [address] [place], [place] [address]  Report Status 05/01/2018 FINAL  Final      Studies: Dg Chest 2 View  Result Date: 05/01/2018 CLINICAL DATA:  Shortness of breath for 1 month EXAM: CHEST - 2 VIEW COMPARISON:  02/25/2018 FINDINGS: Cardiac shadow is enlarged. The overall inspiratory effort is poor without focal infiltrate or sizable effusion. Degenerative changes of thoracic spine are noted. IMPRESSION: Poor inspiratory effort.  No acute abnormality seen. Electronically Signed   By: Inez Catalina M.D.   On: 05/01/2018 14:41   Ct Angio Chest Pe W And/or Wo Contrast  Result Date: 05/01/2018 CLINICAL DATA:  Abdominal pain and swelling EXAM: CT ANGIOGRAPHY CHEST CT ABDOMEN AND PELVIS WITH CONTRAST TECHNIQUE: Multidetector CT imaging of the chest was performed using the standard protocol during bolus administration of intravenous contrast. Multiplanar CT image reconstructions and MIPs were obtained to evaluate the vascular anatomy. Multidetector CT imaging of the abdomen and pelvis was performed using the standard protocol during bolus administration of  intravenous contrast. CONTRAST:  111mL ISOVUE-370 IOPAMIDOL (ISOVUE-370) INJECTION 76% COMPARISON:  None. FINDINGS: CTA CHEST FINDINGS Cardiovascular: There is incomplete coverage of the lower chest such that lower lobe segmental and subsegmental branches are incompletely covered. Reportedly this coverage is related to patient movement after scout acquisition. No evidence of pulmonary embolism. Cardiomegaly without pericardial effusion. The heart is incompletely covered. Limited aortic opacification. Mediastinum/Nodes: Negative for thoracic adenopathy. There is bilateral axillary adenopathy that is mild and likely related to patient's anasarca. Lungs/Pleura: Most convincing on coronal reformats there is interlobular septal thickening that is mild. No alveolar edema or pneumonia. Musculoskeletal: No acute finding. Review of the MIP images confirms the above findings. CT ABDOMEN and PELVIS FINDINGS Hepatobiliary: Heterogeneous enhancement the liver that resolves on delayed phase, with "nutmeg" appearance, likely passive congestion. There is no definitive changes of cirrhosis. No evidence of biliary obstruction or stone. Pancreas: Unremarkable. Spleen: Unremarkable. Adrenals/Urinary Tract: Negative adrenals. No hydronephrosis or stone. Unremarkable bladder. Stomach/Bowel:  No obstruction. No inflammatory changes. Vascular/Lymphatic: Atherosclerotic calcification. No acute vascular finding. The portal venous system is patent. No mass or adenopathy. Reproductive:Hysterectomy Other: Massive ascites. Mild reticulation of the greater omentum is likely edema. There is no peritoneal nodularity or septation. Musculoskeletal: Degenerative changes without acute or aggressive finding Please note that axial portal venous phase images are only through a partial volume such that some of the abdominal structures have to be reviewed on reformatted images. Image repair is still underway; the study will be signed for expeditious patient  care. Review of the MIP images confirms the above findings. IMPRESSION: 1. Massive ascites and generalized anasarca. Heterogeneous liver likely from passive congestion and a cardiac cause is favored. 2. Cardiomegaly. 3. Minimal interstitial pulmonary edema. 4. Negative for pulmonary embolism. Note that the lower lobe distal segmental and subsegmental vessels are incompletely covered due to patient motion. Electronically Signed   By: Monte Fantasia M.D.   On: 05/01/2018 15:25   US Abdomen Complete  Result Date: 05/01/2018 CLINICAL DATA:  67 y/o  F; cirrhosis with ascites. EXAM: ABDOMEN ULTRASOUND COMPLETE COMPARISON:  05/01/2018 CT abdomen and pelvis. FINDINGS: Gallbladder: Gallbladder sludge and small stones. No gallbladder wall thickening. Negative sonographic Murphy's sign. Common bile duct: Diameter: 3 mm Liver: Diffuse heterogeneity of the liver compatible with cirrhosis. No focal liver abnormality identified. Portal vein is patent on color Doppler imaging with normal direction of blood flow towards the liver. IVC: No abnormality visualized. Pancreas: Visualized portion unremarkable. Spleen: Size and appearance within normal limits. Right Kidney: Length: 10.3 cm. Echogenicity  within normal limits. No mass or hydronephrosis visualized. Left Kidney: Length: 9.5 cm. Echogenicity within normal limits. No mass or hydronephrosis visualized. Abdominal aorta: No aneurysm visualized. Other findings: Perihepatic ascites. IMPRESSION: 1. Gallbladder sludge and small stones. 2. Cirrhotic liver. 3. Perihepatic ascites. 4. No acute process identified. Electronically Signed   By: Kristine Garbe M.D.   On: 05/01/2018 18:18   Ct Abdomen Pelvis W Contrast  Result Date: 05/01/2018 CLINICAL DATA:  Abdominal pain and swelling EXAM: CT ANGIOGRAPHY CHEST CT ABDOMEN AND PELVIS WITH CONTRAST TECHNIQUE: Multidetector CT imaging of the chest was performed using the standard protocol during bolus administration of  intravenous contrast. Multiplanar CT image reconstructions and MIPs were obtained to evaluate the vascular anatomy. Multidetector CT imaging of the abdomen and pelvis was performed using the standard protocol during bolus administration of intravenous contrast. CONTRAST:  177mL ISOVUE-370 IOPAMIDOL (ISOVUE-370) INJECTION 76% COMPARISON:  None. FINDINGS: CTA CHEST FINDINGS Cardiovascular: There is incomplete coverage of the lower chest such that lower lobe segmental and subsegmental branches are incompletely covered. Reportedly this coverage is related to patient movement after scout acquisition. No evidence of pulmonary embolism. Cardiomegaly without pericardial effusion. The heart is incompletely covered. Limited aortic opacification. Mediastinum/Nodes: Negative for thoracic adenopathy. There is bilateral axillary adenopathy that is mild and likely related to patient's anasarca. Lungs/Pleura: Most convincing on coronal reformats there is interlobular septal thickening that is mild. No alveolar edema or pneumonia. Musculoskeletal: No acute finding. Review of the MIP images confirms the above findings. CT ABDOMEN and PELVIS FINDINGS Hepatobiliary: Heterogeneous enhancement the liver that resolves on delayed phase, with "nutmeg" appearance, likely passive congestion. There is no definitive changes of cirrhosis. No evidence of biliary obstruction or stone. Pancreas: Unremarkable. Spleen: Unremarkable. Adrenals/Urinary Tract: Negative adrenals. No hydronephrosis or stone. Unremarkable bladder. Stomach/Bowel:  No obstruction. No inflammatory changes. Vascular/Lymphatic: Atherosclerotic calcification. No acute vascular finding. The portal venous system is patent. No mass or adenopathy. Reproductive:Hysterectomy Other: Massive ascites. Mild reticulation of the greater omentum is likely edema. There is no peritoneal nodularity or septation. Musculoskeletal: Degenerative changes without acute or aggressive finding Please  note that axial portal venous phase images are only through a partial volume such that some of the abdominal structures have to be reviewed on reformatted images. Image repair is still underway; the study will be signed for expeditious patient care. Review of the MIP images confirms the above findings. IMPRESSION: 1. Massive ascites and generalized anasarca. Heterogeneous liver likely from passive congestion and a cardiac cause is favored. 2. Cardiomegaly. 3. Minimal interstitial pulmonary edema. 4. Negative for pulmonary embolism. Note that the lower lobe distal segmental and subsegmental vessels are incompletely covered due to patient motion. Electronically Signed   By: Monte Fantasia M.D.   On: 05/01/2018 15:25   US Paracentesis  Result Date: 05/02/2018 INDICATION: Shortness of breath. Abdominal distention. Ascites. Request for diagnostic and therapeutic paracentesis. EXAM: ULTRASOUND GUIDED LEFT LOWER QUADRANT PARACENTESIS MEDICATIONS: None. COMPLICATIONS: None immediate. PROCEDURE: Informed written consent was obtained from the patient after a discussion of the risks, benefits and alternatives to treatment. A timeout was performed prior to the initiation of the procedure. Initial ultrasound scanning demonstrates a large amount of ascites within the left lower abdominal quadrant. The left lower abdomen was prepped and draped in the usual sterile fashion. 1% lidocaine with epinephrine was used for local anesthesia. Following this, a 19 gauge, 7-cm, Yueh catheter was introduced. An ultrasound image was saved for documentation purposes. The paracentesis was performed. The catheter was removed  and a dressing was applied. The patient tolerated the procedure well without immediate post procedural complication. FINDINGS: A total of approximately 6 L of clear yellow fluid was removed. Samples were sent to the laboratory as requested by the clinical team. IMPRESSION: Successful ultrasound-guided paracentesis  yielding 6 liters of peritoneal fluid. Read by: Ascencion Dike PA-C Electronically Signed   By: Corrie Mckusick D.O.   On: 05/01/2018 17:41    Scheduled Meds: . enoxaparin (LOVENOX) injection  40 mg Subcutaneous Q24H  . feeding supplement (ENSURE ENLIVE)  237 mL Oral BID BM  . furosemide  20 mg Oral Daily  . gabapentin  300 mg Oral BID  . lactulose  30 g Oral Daily  . potassium chloride  40 mEq Oral TID    Continuous Infusions:   LOS: 1 day     Kayleen Memos, MD [facility] Pager [phone]  If 7PM-7AM, please contact night-coverage [email or web address] Password [email or web address] 05/02/2018, 1:29 PM

## 2018-05-02 NOTE — Progress Notes (Signed)
  Echocardiogram 2D Echocardiogram has been performed.  Gloria [name] G Gloria [name] 05/02/2018, 10:40 AM

## 2018-05-03 DIAGNOSIS — R601 Generalized edema: Secondary | ICD-10-CM

## 2018-05-03 DIAGNOSIS — E1140 Type 2 diabetes mellitus with diabetic neuropathy, unspecified: Secondary | ICD-10-CM

## 2018-05-03 DIAGNOSIS — I5021 Acute systolic (congestive) heart failure: Secondary | ICD-10-CM | POA: Diagnosis present

## 2018-05-03 DIAGNOSIS — K7460 Unspecified cirrhosis of liver: Secondary | ICD-10-CM

## 2018-05-03 DIAGNOSIS — G4733 Obstructive sleep apnea (adult) (pediatric): Secondary | ICD-10-CM

## 2018-05-03 DIAGNOSIS — R188 Other ascites: Secondary | ICD-10-CM

## 2018-05-03 DIAGNOSIS — E1165 Type 2 diabetes mellitus with hyperglycemia: Secondary | ICD-10-CM

## 2018-05-03 LAB — MAGNESIUM: Magnesium: 1.9 mg/dL (ref 1.7–2.4)

## 2018-05-03 LAB — CBC WITH DIFFERENTIAL/PLATELET
Abs Immature Granulocytes: 0.03 10*3/uL (ref 0.00–0.07)
Basophils Absolute: 0.1 10*3/uL (ref 0.0–0.1)
Basophils Relative: 1 %
Eosinophils Absolute: 0.3 10*3/uL (ref 0.0–0.5)
Eosinophils Relative: 4 %
HCT: 32.3 % — ABNORMAL LOW (ref 36.0–46.0)
Hemoglobin: 9.9 g/dL — ABNORMAL LOW (ref 12.0–15.0)
Immature Granulocytes: 0 %
Lymphocytes Relative: 8 %
Lymphs Abs: 0.6 10*3/uL — ABNORMAL LOW (ref 0.7–4.0)
MCH: 27.7 pg (ref 26.0–34.0)
MCHC: 30.7 g/dL (ref 30.0–36.0)
MCV: 90.5 fL (ref 80.0–100.0)
Monocytes Absolute: 0.3 10*3/uL (ref 0.1–1.0)
Monocytes Relative: 4 %
Neutro Abs: 6 10*3/uL (ref 1.7–7.7)
Neutrophils Relative %: 83 %
Platelets: 258 10*3/uL (ref 150–400)
RBC: 3.57 MIL/uL — ABNORMAL LOW (ref 3.87–5.11)
RDW: 14.6 % (ref 11.5–15.5)
WBC: 7.3 10*3/uL (ref 4.0–10.5)
nRBC: 0 % (ref 0.0–0.2)

## 2018-05-03 LAB — BASIC METABOLIC PANEL
Anion gap: 7 (ref 5–15)
BUN: <5 mg/dL — ABNORMAL LOW (ref 8–23)
CO2: 25 mmol/L (ref 22–32)
Calcium: 8.4 mg/dL — ABNORMAL LOW (ref 8.9–10.3)
Chloride: 104 mmol/L (ref 98–111)
Creatinine, Ser: 0.95 mg/dL (ref 0.44–1.00)
GFR calc Af Amer: >60 mL/min (ref >60)
GFR calc non Af Amer: >60 mL/min (ref >60)
Glucose, Bld: 122 mg/dL — ABNORMAL HIGH (ref 70–99)
Potassium: 3.5 mmol/L (ref 3.5–5.1)
Sodium: 136 mmol/L (ref 135–145)

## 2018-05-03 LAB — GLUCOSE, CAPILLARY: Glucose-Capillary: 97 mg/dL (ref 70–99)

## 2018-05-03 LAB — HEPATITIS PANEL, ACUTE
HCV Ab: 0.2 s/co ratio (ref 0.0–0.9)
Hep A IgM: NEGATIVE
Hep B C IgM: NEGATIVE
Hepatitis B Surface Ag: NEGATIVE

## 2018-05-03 LAB — PHOSPHORUS: Phosphorus: 3.2 mg/dL (ref 2.5–4.6)

## 2018-05-03 MED ORDER — MORPHINE SULFATE (PF) 2 MG/ML IV SOLN
1.0000 mg | Freq: Once | INTRAVENOUS | Status: AC
  Administered 2018-05-03: 1 mg via INTRAVENOUS
  Filled 2018-05-03: qty 1

## 2018-05-03 NOTE — Progress Notes (Signed)
DAILY PROGRESS NOTE   Patient Name: Gloria [name] Date of Encounter: 05/03/2018  Chief Complaint   Breathing has improved  Patient Profile   Gloria [name] is a 67 y.o. female with a hx of mild HFrEF, HLD, HTN, DM, depression, who was admitted with dyspnea who is being seen today for the evaluation of newly reduced EF at the request of Dr. Nevada Crane.  Subjective   Breathing has improved overnight.  She had 6 L of fluid removed via paracentesis.  She also diuresed 3 L negative overnight.  Abdominal ultrasound showed a cirrhotic liver with perihepatic ascites, gallbladder sludge and small stones.  I personally reviewed her echo yesterday which showed a decrease in LVEF from 50 to 55% down to 20 to 25%, with mild LVH, severe global hypokinesis and grade 3 diastolic dysfunction with elevated LV filling pressure.  There is moderate MR, TR, mild PI and RVSP of 44 mmHg.  The etiology of her decline in LV function is unclear.  Objective   Vitals:   05/02/18 1946 05/03/18 0458 05/03/18 0501 05/03/18 1124  BP: 121/77 117/75  100/70  Pulse: (!) 101 94  89  Resp: 18 18    Temp: 98.8 F (37.1 C) 98 F (36.7 C)    TempSrc: Oral Oral    SpO2: 93% 94%  100%  Weight:   91.8 kg   Height:        Intake/Output Summary (Last 24 hours) at 05/03/2018 1200 Last data filed at 05/03/2018 0026 Gross per 24 hour  Intake 360 ml  Output 1900 ml  Net -1540 ml   Filed Weights   05/01/18 1835 05/02/18 0328 05/03/18 0501  Weight: 94.8 kg 94.5 kg 91.8 kg    Physical Exam   General appearance: alert and no distress Neck: JVD - several cm above sternal notch, no carotid bruit and thyroid not enlarged, symmetric, no tenderness/mass/nodules Lungs: diminished breath sounds bibasilar Heart: regular rate and rhythm and systolic murmur: systolic ejection 3/6, blowing at apex Abdomen: Distended, mildly protuberant, mild fluid wave, no rebound, or guarding Extremities: edema Trace to 1+ bilateral lower extremity  pitting edema Pulses: 2+ and symmetric Skin: Skin color, texture, turgor normal. No rashes or lesions Neurologic: Grossly normal Psych: Pleasant, quiet  Inpatient Medications    Scheduled Meds: . enoxaparin (LOVENOX) injection  40 mg Subcutaneous Q24H  . feeding supplement (GLUCERNA SHAKE)  237 mL Oral TID BM  . furosemide  40 mg Intravenous Daily  . gabapentin  300 mg Oral BID  . lactulose  30 g Oral Daily  . losartan  25 mg Oral Daily  . multivitamin with minerals  1 tablet Oral Daily    Continuous Infusions:   PRN Meds: diphenhydrAMINE, iopamidol, labetalol, ondansetron (ZOFRAN) IV, triamcinolone cream   Labs   Results for orders placed or performed during the hospital encounter of 05/01/18 (from the past 48 hour(s))  Brain natriuretic peptide     Status: Abnormal   Collection Time: 05/01/18 12:47 PM  Result Value Ref Range   B Natriuretic Peptide 1,514.4 (H) 0.0 - 100.0 pg/mL    Comment: Performed at [facility], [address] [place], [place] [address]  Lipase, blood     Status: None   Collection Time: 05/01/18 12:49 PM  Result Value Ref Range   Lipase 26 11 - 51 U/L    Comment: Performed at [facility], [place] [address] [place], [place] [address]  Comprehensive metabolic panel  Status: Abnormal   Collection Time: 05/01/18 12:49 PM  Result Value Ref Range   Sodium 136 135 - 145 mmol/L   Potassium 2.9 (L) 3.5 - 5.1 mmol/L   Chloride 103 98 - 111 mmol/L   CO2 26 22 - 32 mmol/L   Glucose, Bld 137 (H) 70 - 99 mg/dL   BUN <5 (L) 8 - 23 mg/dL   Creatinine, Ser 0.89 0.44 - 1.00 mg/dL   Calcium 8.9 8.9 - 10.3 mg/dL   Total Protein 8.1 6.5 - 8.1 g/dL   Albumin 3.0 (L) 3.5 - 5.0 g/dL   AST 22 15 - 41 U/L   ALT 9 0 - 44 U/L   Alkaline Phosphatase 99 38 - 126 U/L   Total Bilirubin 2.1 (H) 0.3 - 1.2 mg/dL   GFR calc non Af Amer >60 >60 mL/min   GFR calc Af Amer >60 >60 mL/min    Comment: (NOTE) The eGFR has been calculated using the CKD EPI  equation. This calculation has not been validated in all clinical situations. eGFR's persistently <60 mL/min signify possible Chronic Kidney Disease.    Anion gap 7 5 - 15    Comment: Performed at [facility] [address] [place], [place] [address]  CBC     Status: Abnormal   Collection Time: 05/01/18 12:49 PM  Result Value Ref Range   WBC 8.0 4.0 - 10.5 K/uL   RBC 3.92 3.87 - 5.11 MIL/uL   Hemoglobin 11.2 (L) 12.0 - 15.0 g/dL   HCT 35.5 (L) 36.0 - 46.0 %   MCV 90.6 80.0 - 100.0 fL   MCH 28.6 26.0 - 34.0 pg   MCHC 31.5 30.0 - 36.0 g/dL   RDW 14.6 11.5 - 15.5 %   Platelets 282 150 - 400 K/uL   nRBC 0.0 0.0 - 0.2 %    Comment: Performed at [facility], [place] [address] [place], [place] [address]  Urinalysis, Routine w reflex microscopic     Status: Abnormal   Collection Time: 05/01/18 12:49 PM  Result Value Ref Range   Color, Urine YELLOW YELLOW   APPearance CLEAR CLEAR   Specific Gravity, Urine 1.009 1.005 - 1.030   pH 7.0 5.0 - 8.0   Glucose, UA NEGATIVE NEGATIVE mg/dL   Hgb urine dipstick NEGATIVE NEGATIVE   Bilirubin Urine NEGATIVE NEGATIVE   Ketones, ur NEGATIVE NEGATIVE mg/dL   Protein, ur 30 (A) NEGATIVE mg/dL   Nitrite NEGATIVE NEGATIVE   Leukocytes, UA NEGATIVE NEGATIVE   WBC, UA 0-5 0 - 5 WBC/hpf   Bacteria, UA NONE SEEN NONE SEEN   Squamous Epithelial / LPF 0-5 0 - 5   Mucus PRESENT     Comment: Performed at [facility] [address] [place], [place] [address]  Urine rapid drug screen (hosp performed)     Status: None   Collection Time: 05/01/18 12:49 PM  Result Value Ref Range   Opiates NONE DETECTED NONE DETECTED   Cocaine NONE DETECTED NONE DETECTED   Benzodiazepines NONE DETECTED NONE DETECTED   Amphetamines NONE DETECTED NONE DETECTED   Tetrahydrocannabinol NONE DETECTED NONE DETECTED   Barbiturates NONE DETECTED NONE DETECTED    Comment: (NOTE) DRUG SCREEN FOR MEDICAL PURPOSES ONLY.  IF CONFIRMATION IS NEEDED FOR ANY  PURPOSE, NOTIFY LAB WITHIN 5 DAYS. LOWEST DETECTABLE LIMITS FOR URINE DRUG SCREEN Drug Class  Cutoff (ng/mL) Amphetamine and metabolites    1000 Barbiturate and metabolites    200 Benzodiazepine                 295 Tricyclics and metabolites     300 Opiates and metabolites        300 Cocaine and metabolites        300 THC                            50 Performed at [facility], [place] [address], [place], [place] [address]   Type and screen [place]     Status: None   Collection Time: 05/01/18 12:50 PM  Result Value Ref Range   ABO/RH(D) O POS    Antibody Screen NEG    Sample Expiration      05/04/2018 Performed at [facility], [place] [address], [place], [place] [address]   ABO/Rh     Status: None   Collection Time: 05/01/18 12:50 PM  Result Value Ref Range   ABO/RH(D)      O POS Performed at [facility] [address] [place], [place] [address]   Protime-INR     Status: Abnormal   Collection Time: 05/01/18 12:53 PM  Result Value Ref Range   Prothrombin Time 17.3 (H) 11.4 - 15.2 seconds   INR 1.43     Comment: Performed at [facility] [address], [place], [place] [address]  APTT     Status: None   Collection Time: 05/01/18 12:53 PM  Result Value Ref Range   aPTT 34 24 - 36 seconds    Comment: Performed at [facility] [address], [place], [place] [address]  I-stat troponin, ED     Status: None   Collection Time: 05/01/18  1:26 PM  Result Value Ref Range   Troponin i, poc 0.00 0.00 - 0.08 ng/mL   Comment 3            Comment: Due to the release kinetics of cTnI, a negative result within the first hours of the onset of symptoms does not rule out myocardial infarction with certainty. If myocardial infarction is still suspected, repeat the test at appropriate intervals.   Lactate dehydrogenase (pleural or peritoneal fluid)     Status: Abnormal   Collection Time: 05/01/18  5:23 PM  Result  Value Ref Range   LD, Fluid 66 (H) 3 - 23 U/L    Comment: (NOTE) Results should be evaluated in conjunction with serum values    Fluid Type-FLDH PERITONEAL     Comment: FLUID Performed at [facility] [address], [place], [place] [address] CORRECTED ON 11/01 AT 1836: PREVIOUSLY REPORTED AS Peritoneal   Body fluid cell count with differential     Status: None   Collection Time: 05/01/18  5:23 PM  Result Value Ref Range   Fluid Type-FCT PERITONEAL     Comment: FLUID CORRECTED ON 11/01 AT 1836: PREVIOUSLY REPORTED AS Peritoneal    Color, Fluid YELLOW    Appearance, Fluid CLEAR CLEAR   WBC, Fluid 88 0 - 1,000 cu mm   Neutrophil Count, Fluid 15 0 - 25 %   Lymphs, Fluid 35 %   Monocyte-Macrophage-Serous Fluid 50 50 - 90 %   Eos, Fluid 0 %    Comment: Performed at [facility], [place],  [place] [address]  Albumin, pleural or peritoneal fluid     Status: None   Collection Time: 05/01/18  5:23 PM  Result Value Ref Range   Albumin, Fluid 1.7 g/dL    Comment: (NOTE) No normal range established for this test Results should be evaluated in conjunction with serum values    Fluid Type-FALB PERITONEAL     Comment: FLUID Performed at [facility] [address] [place], [place] [address] CORRECTED ON 11/01 AT 2426: PREVIOUSLY REPORTED AS Peritoneal   Glucose, pleural or peritoneal fluid     Status: None   Collection Time: 05/01/18  5:23 PM  Result Value Ref Range   Glucose, Fluid 110 mg/dL    Comment: (NOTE) No normal range established for this test Results should be evaluated in conjunction with serum values    Fluid Type-FGLU PERITONEAL     Comment: FLUID Performed at [facility] [address] [place], [place] [address] CORRECTED ON 11/01 AT 6222: PREVIOUSLY REPORTED AS Peritoneal   Culture, body fluid-bottle     Status: None (Preliminary result)   Collection Time: 05/01/18  5:23 PM  Result Value Ref Range   Specimen  Description PERITONEAL    Special Requests NONE    Culture      NO GROWTH < 24 HOURS Performed at [facility], [place] [address] [place], [place] [address]    Report Status PENDING   Gram stain     Status: None   Collection Time: 05/01/18  5:23 PM  Result Value Ref Range   Specimen Description PERITONEAL    Special Requests NONE    Gram Stain      RARE WBC PRESENT, PREDOMINANTLY PMN NO ORGANISMS SEEN Performed at [facility], [place] [address], [place], [place] [address]    Report Status 05/01/2018 FINAL   Glucose, capillary     Status: Abnormal   Collection Time: 05/01/18  8:15 PM  Result Value Ref Range   Glucose-Capillary 130 (H) 70 - 99 mg/dL  Hemoglobin A1c     Status: None   Collection Time: 05/01/18 10:06 PM  Result Value Ref Range   Hgb A1c MFr Bld 5.2 4.8 - 5.6 %    Comment: (NOTE) Pre diabetes:          5.7%-6.4% Diabetes:              >6.4% Glycemic control for   <7.0% adults with diabetes    Mean Plasma Glucose 102.54 mg/dL    Comment: Performed at [facility] [address] [place], [place] [address]  Brain natriuretic peptide     Status: Abnormal   Collection Time: 05/01/18 10:06 PM  Result Value Ref Range   B Natriuretic Peptide 1,504.5 (H) 0.0 - 100.0 pg/mL    Comment: Performed at [facility] [address], [place],  [address]  Hepatitis panel, acute     Status: None   Collection Time: 05/01/18 10:06 PM  Result Value Ref Range   Hepatitis B Surface Ag Negative Negative   HCV Ab 0.2 0.0 - 0.9 s/co ratio    Comment: (NOTE)                                  Negative:     < 0.8  Indeterminate: 0.8 - 0.9                                  Positive:     > 0.9 The CDC recommends that a positive HCV antibody result be followed up with a HCV Nucleic Acid Amplification test (638466). Performed At: [facility] [place], [place] [id number] Rush Farmer MD [id number]    Hep  A IgM Negative Negative   Hep B C IgM Negative Negative  HIV Antibody (routine testing w rflx)     Status: None   Collection Time: 05/01/18 10:06 PM  Result Value Ref Range   HIV Screen 4th Generation wRfx Non Reactive Non Reactive    Comment: (NOTE) Performed At: [facility] [place], [place] [id number] Rush Farmer MD [id number]   Ammonia     Status: Abnormal   Collection Time: 05/01/18 10:06 PM  Result Value Ref Range   Ammonia 36 (H) 9 - 35 umol/L    Comment: Performed at [facility], [place] [address] [place], [place] [address]  Glucose, capillary     Status: Abnormal   Collection Time: 05/01/18 11:17 PM  Result Value Ref Range   Glucose-Capillary 103 (H) 70 - 99 mg/dL  Glucose, capillary     Status: Abnormal   Collection Time: 05/02/18  5:40 AM  Result Value Ref Range   Glucose-Capillary 126 (H) 70 - 99 mg/dL  CBC with Differential/Platelet     Status: Abnormal   Collection Time: 05/02/18  6:13 AM  Result Value Ref Range   WBC 7.3 4.0 - 10.5 K/uL   RBC 3.38 (L) 3.87 - 5.11 MIL/uL   Hemoglobin 9.7 (L) 12.0 - 15.0 g/dL   HCT 30.3 (L) 36.0 - 46.0 %   MCV 89.6 80.0 - 100.0 fL   MCH 28.7 26.0 - 34.0 pg   MCHC 32.0 30.0 - 36.0 g/dL   RDW 14.6 11.5 - 15.5 %   Platelets 233 150 - 400 K/uL   nRBC 0.0 0.0 - 0.2 %   Neutrophils Relative % 82 %   Neutro Abs 6.0 1.7 - 7.7 K/uL   Lymphocytes Relative 7 %   Lymphs Abs 0.5 (L) 0.7 - 4.0 K/uL   Monocytes Relative 6 %   Monocytes Absolute 0.4 0.1 - 1.0 K/uL   Eosinophils Relative 4 %   Eosinophils Absolute 0.3 0.0 - 0.5 K/uL   Basophils Relative 1 %   Basophils Absolute 0.1 0.0 - 0.1 K/uL   Immature Granulocytes 0 %   Abs Immature Granulocytes 0.03 0.00 - 0.07 K/uL    Comment: Performed at [facility], [address], [place], [place] [address]  Basic metabolic panel     Status: Abnormal   Collection Time: 05/02/18  6:13 AM  Result Value Ref Range   Sodium 137 135 - 145 mmol/L    Potassium 3.0 (L) 3.5 - 5.1 mmol/L   Chloride 108 98 - 111 mmol/L   CO2 22 22 - 32 mmol/L   Glucose, Bld 141 (H) 70 - 99 mg/dL   BUN <5 (L) 8 - 23 mg/dL   Creatinine, Ser 0.88 0.44 - 1.00 mg/dL   Calcium 8.3 (L) 8.9 - 10.3 mg/dL   GFR calc non Af Amer >60 >60 mL/min   GFR calc Af Amer >60 >60 mL/min    Comment: (NOTE) The eGFR has been calculated using  the CKD EPI equation. This calculation has not been validated in all clinical situations. eGFR's persistently <60 mL/min signify possible Chronic Kidney Disease.    Anion gap 7 5 - 15    Comment: Performed at [facility] [address] [place], [place] [address]  Magnesium     Status: Abnormal   Collection Time: 05/02/18  6:13 AM  Result Value Ref Range   Magnesium 1.6 (L) 1.7 - 2.4 mg/dL    Comment: Performed at [facility] [address] [place], [place] [address]  Brain natriuretic peptide     Status: Abnormal   Collection Time: 05/02/18  6:14 AM  Result Value Ref Range   B Natriuretic Peptide 1,255.5 (H) 0.0 - 100.0 pg/mL    Comment: Performed at [facility] [address], [place], [place] [address]  Basic metabolic panel     Status: Abnormal   Collection Time: 05/03/18  5:27 AM  Result Value Ref Range   Sodium 136 135 - 145 mmol/L   Potassium 3.5 3.5 - 5.1 mmol/L   Chloride 104 98 - 111 mmol/L   CO2 25 22 - 32 mmol/L   Glucose, Bld 122 (H) 70 - 99 mg/dL   BUN <5 (L) 8 - 23 mg/dL   Creatinine, Ser 0.95 0.44 - 1.00 mg/dL   Calcium 8.4 (L) 8.9 - 10.3 mg/dL   GFR calc non Af Amer >60 >60 mL/min   GFR calc Af Amer >60 >60 mL/min    Comment: (NOTE) The eGFR has been calculated using the CKD EPI equation. This calculation has not been validated in all clinical situations. eGFR's persistently <60 mL/min signify possible Chronic Kidney Disease.    Anion gap 7 5 - 15    Comment: Performed at [facility] [address], [place], [place] [address]  CBC with Differential/Platelet     Status:  Abnormal   Collection Time: 05/03/18  5:27 AM  Result Value Ref Range   WBC 7.3 4.0 - 10.5 K/uL   RBC 3.57 (L) 3.87 - 5.11 MIL/uL   Hemoglobin 9.9 (L) 12.0 - 15.0 g/dL   HCT 32.3 (L) 36.0 - 46.0 %   MCV 90.5 80.0 - 100.0 fL   MCH 27.7 26.0 - 34.0 pg   MCHC 30.7 30.0 - 36.0 g/dL   RDW 14.6 11.5 - 15.5 %   Platelets 258 150 - 400 K/uL   nRBC 0.0 0.0 - 0.2 %   Neutrophils Relative % 83 %   Neutro Abs 6.0 1.7 - 7.7 K/uL   Lymphocytes Relative 8 %   Lymphs Abs 0.6 (L) 0.7 - 4.0 K/uL   Monocytes Relative 4 %   Monocytes Absolute 0.3 0.1 - 1.0 K/uL   Eosinophils Relative 4 %   Eosinophils Absolute 0.3 0.0 - 0.5 K/uL   Basophils Relative 1 %   Basophils Absolute 0.1 0.0 - 0.1 K/uL   Immature Granulocytes 0 %   Abs Immature Granulocytes 0.03 0.00 - 0.07 K/uL    Comment: Performed at [facility], [place] [address], [place], [place] [address]  Magnesium     Status: None   Collection Time: 05/03/18  5:27 AM  Result Value Ref Range   Magnesium 1.9 1.7 - 2.4 mg/dL    Comment: Performed at [facility] [address], [place], [place] [address]  Phosphorus     Status: None   Collection Time: 05/03/18  5:27 AM  Result Value  Ref Range   Phosphorus 3.2 2.5 - 4.6 mg/dL    Comment: Performed at [facility] [address] [place], [place] [address]    ECG   Sinus tachycardia 101- Personally Reviewed  Telemetry   Sinus rhythm- Personally Reviewed  Radiology    Dg Chest 2 View  Result Date: 05/01/2018 CLINICAL DATA:  Shortness of breath for 1 month EXAM: CHEST - 2 VIEW COMPARISON:  02/25/2018 FINDINGS: Cardiac shadow is enlarged. The overall inspiratory effort is poor without focal infiltrate or sizable effusion. Degenerative changes of thoracic spine are noted. IMPRESSION: Poor inspiratory effort.  No acute abnormality seen. Electronically Signed   By: Inez Catalina M.D.   On: 05/01/2018 14:41   Ct Angio Chest Pe W And/or Wo Contrast  Result Date:  05/01/2018 CLINICAL DATA:  Abdominal pain and swelling EXAM: CT ANGIOGRAPHY CHEST CT ABDOMEN AND PELVIS WITH CONTRAST TECHNIQUE: Multidetector CT imaging of the chest was performed using the standard protocol during bolus administration of intravenous contrast. Multiplanar CT image reconstructions and MIPs were obtained to evaluate the vascular anatomy. Multidetector CT imaging of the abdomen and pelvis was performed using the standard protocol during bolus administration of intravenous contrast. CONTRAST:  164m ISOVUE-370 IOPAMIDOL (ISOVUE-370) INJECTION 76% COMPARISON:  None. FINDINGS: CTA CHEST FINDINGS Cardiovascular: There is incomplete coverage of the lower chest such that lower lobe segmental and subsegmental branches are incompletely covered. Reportedly this coverage is related to patient movement after scout acquisition. No evidence of pulmonary embolism. Cardiomegaly without pericardial effusion. The heart is incompletely covered. Limited aortic opacification. Mediastinum/Nodes: Negative for thoracic adenopathy. There is bilateral axillary adenopathy that is mild and likely related to patient's anasarca. Lungs/Pleura: Most convincing on coronal reformats there is interlobular septal thickening that is mild. No alveolar edema or pneumonia. Musculoskeletal: No acute finding. Review of the MIP images confirms the above findings. CT ABDOMEN and PELVIS FINDINGS Hepatobiliary: Heterogeneous enhancement the liver that resolves on delayed phase, with "nutmeg" appearance, likely passive congestion. There is no definitive changes of cirrhosis. No evidence of biliary obstruction or stone. Pancreas: Unremarkable. Spleen: Unremarkable. Adrenals/Urinary Tract: Negative adrenals. No hydronephrosis or stone. Unremarkable bladder. Stomach/Bowel:  No obstruction. No inflammatory changes. Vascular/Lymphatic: Atherosclerotic calcification. No acute vascular finding. The portal venous system is patent. No mass or adenopathy.  Reproductive:Hysterectomy Other: Massive ascites. Mild reticulation of the greater omentum is likely edema. There is no peritoneal nodularity or septation. Musculoskeletal: Degenerative changes without acute or aggressive finding Please note that axial portal venous phase images are only through a partial volume such that some of the abdominal structures have to be reviewed on reformatted images. Image repair is still underway; the study will be signed for expeditious patient care. Review of the MIP images confirms the above findings. IMPRESSION: 1. Massive ascites and generalized anasarca. Heterogeneous liver likely from passive congestion and a cardiac cause is favored. 2. Cardiomegaly. 3. Minimal interstitial pulmonary edema. 4. Negative for pulmonary embolism. Note that the lower lobe distal segmental and subsegmental vessels are incompletely covered due to patient motion. Electronically Signed   By: JMonte FantasiaM.D.   On: 05/01/2018 15:25   [place]oreaAbdomen Complete  Result Date: 05/01/2018 CLINICAL DATA:  67y/o  F; cirrhosis with ascites. EXAM: ABDOMEN ULTRASOUND COMPLETE COMPARISON:  05/01/2018 CT abdomen and pelvis. FINDINGS: Gallbladder: Gallbladder sludge and small stones. No gallbladder wall thickening. Negative sonographic Murphy's sign. Common bile duct: Diameter: 3 mm Liver: Diffuse heterogeneity of the liver compatible with cirrhosis. No focal liver abnormality  identified. Portal vein is patent on color Doppler imaging with normal direction of blood flow towards the liver. IVC: No abnormality visualized. Pancreas: Visualized portion unremarkable. Spleen: Size and appearance within normal limits. Right Kidney: Length: 10.3 cm. Echogenicity within normal limits. No mass or hydronephrosis visualized. Left Kidney: Length: 9.5 cm. Echogenicity within normal limits. No mass or hydronephrosis visualized. Abdominal aorta: No aneurysm visualized. Other findings: Perihepatic ascites. IMPRESSION: 1.  Gallbladder sludge and small stones. 2. Cirrhotic liver. 3. Perihepatic ascites. 4. No acute process identified. Electronically Signed   By: Kristine Garbe M.D.   On: 05/01/2018 18:18   Ct Abdomen Pelvis W Contrast  Result Date: 05/01/2018 CLINICAL DATA:  Abdominal pain and swelling EXAM: CT ANGIOGRAPHY CHEST CT ABDOMEN AND PELVIS WITH CONTRAST TECHNIQUE: Multidetector CT imaging of the chest was performed using the standard protocol during bolus administration of intravenous contrast. Multiplanar CT image reconstructions and MIPs were obtained to evaluate the vascular anatomy. Multidetector CT imaging of the abdomen and pelvis was performed using the standard protocol during bolus administration of intravenous contrast. CONTRAST:  182m ISOVUE-370 IOPAMIDOL (ISOVUE-370) INJECTION 76% COMPARISON:  None. FINDINGS: CTA CHEST FINDINGS Cardiovascular: There is incomplete coverage of the lower chest such that lower lobe segmental and subsegmental branches are incompletely covered. Reportedly this coverage is related to patient movement after scout acquisition. No evidence of pulmonary embolism. Cardiomegaly without pericardial effusion. The heart is incompletely covered. Limited aortic opacification. Mediastinum/Nodes: Negative for thoracic adenopathy. There is bilateral axillary adenopathy that is mild and likely related to patient's anasarca. Lungs/Pleura: Most convincing on coronal reformats there is interlobular septal thickening that is mild. No alveolar edema or pneumonia. Musculoskeletal: No acute finding. Review of the MIP images confirms the above findings. CT ABDOMEN and PELVIS FINDINGS Hepatobiliary: Heterogeneous enhancement the liver that resolves on delayed phase, with "nutmeg" appearance, likely passive congestion. There is no definitive changes of cirrhosis. No evidence of biliary obstruction or stone. Pancreas: Unremarkable. Spleen: Unremarkable. Adrenals/Urinary Tract: Negative adrenals.  No hydronephrosis or stone. Unremarkable bladder. Stomach/Bowel:  No obstruction. No inflammatory changes. Vascular/Lymphatic: Atherosclerotic calcification. No acute vascular finding. The portal venous system is patent. No mass or adenopathy. Reproductive:Hysterectomy Other: Massive ascites. Mild reticulation of the greater omentum is likely edema. There is no peritoneal nodularity or septation. Musculoskeletal: Degenerative changes without acute or aggressive finding Please note that axial portal venous phase images are only through a partial volume such that some of the abdominal structures have to be reviewed on reformatted images. Image repair is still underway; the study will be signed for expeditious patient care. Review of the MIP images confirms the above findings. IMPRESSION: 1. Massive ascites and generalized anasarca. Heterogeneous liver likely from passive congestion and a cardiac cause is favored. 2. Cardiomegaly. 3. Minimal interstitial pulmonary edema. 4. Negative for pulmonary embolism. Note that the lower lobe distal segmental and subsegmental vessels are incompletely covered due to patient motion. Electronically Signed   By: JMonte FantasiaM.D.   On: 05/01/2018 15:25   [place]  Result Date: 05/02/2018 INDICATION: Shortness of breath. Abdominal distention. Ascites. Request for diagnostic and therapeutic paracentesis. EXAM: ULTRASOUND GUIDED LEFT LOWER QUADRANT PARACENTESIS MEDICATIONS: None. COMPLICATIONS: None immediate. PROCEDURE: Informed written consent was obtained from the patient after a discussion of the risks, benefits and alternatives to treatment. A timeout was performed prior to the initiation of the procedure. Initial ultrasound scanning demonstrates a large amount of ascites within the left lower abdominal quadrant. The left lower abdomen was prepped and draped in  the usual sterile fashion. 1% lidocaine with epinephrine was used for local anesthesia. Following this, a 19  gauge, 7-cm, Yueh catheter was introduced. An ultrasound image was saved for documentation purposes. The paracentesis was performed. The catheter was removed and a dressing was applied. The patient tolerated the procedure well without immediate post procedural complication. FINDINGS: A total of approximately 6 L of clear yellow fluid was removed. Samples were sent to the laboratory as requested by the clinical team. IMPRESSION: Successful ultrasound-guided paracentesis yielding 6 liters of peritoneal fluid. Read by: Ascencion Dike PA-C Electronically Signed   By: Corrie Mckusick D.O.   On: 05/01/2018 17:41    Cardiac Studies   LV EF: 20% -   25%  ------------------------------------------------------------------- Indications:      CHF - 428.0.  ------------------------------------------------------------------- History:   Risk factors:  OSA. Hypertension. Diabetes mellitus.  ------------------------------------------------------------------- Study Conclusions  - Left ventricle: The cavity size was mildly dilated. Wall   thickness was increased in a pattern of mild LVH. Systolic   function was severely reduced. The estimated ejection fraction   was in the range of 20% to 25%. Diffuse hypokinesis. Doppler   parameters are consistent with restrictive left ventricular   relaxation (grade 3 diastolic dysfunction). The E/e&' ratio is   >15, suggesting elevated LV filling pressure. - Mitral valve: Mildly thickened leaflets . There was moderate   regurgitation. - Left atrium: The atrium was mildly dilated. - Right ventricle: The cavity size was mildly dilated. Mildly   reduced systolic dysfunction. - Tricuspid valve: There was moderate regurgitation. - Pulmonic valve: There was mild regurgitation. - Pulmonary arteries: PA peak pressure: 44 mm Hg (S). - Inferior vena cava: The vessel was dilated. The respirophasic   diameter changes were blunted (< 50%), consistent with elevated   central  venous pressure. - Pericardium, extracardiac: A trivial pericardial effusion was   identified.  Impressions:  - Compared to a prior study in 2017, the LVEF is severely reduced   to 20-25%.  Assessment   Principal Problem:   Acute systolic (congestive) heart failure (HCC) Active Problems:   Hyperlipidemia   Essential hypertension   Uncontrolled type 2 diabetes mellitus with diabetic neuropathy, without long-term current use of insulin (HCC)   Morbid obesity (HCC)   OSA (obstructive sleep apnea)   Anasarca   Cirrhosis of liver with ascites ([place])   Plan   1. Mrs. Watters has new acute systolic congestive heart failure with predominant right heart symptoms and cirrhosis of the liver with ascites.  She had a significant paracentesis with 6 L removed and is diuresing well with improvement in her shortness of breath.  The etiology of her worsening heart failure is unclear.  She does have a history of insulin-dependent diabetes which is been poorly controlled, hypertension, dyslipidemia and multiple risk factors for coronary disease I will likely need right and left heart catheterization as well as an advanced heart failure service consult.  Creatinine is relatively stable with daily IV diuresis, continue.  Time Spent Directly with Patient:  I have spent a total of 25 minutes with the patient reviewing hospital notes, telemetry, EKGs, labs and examining the patient as well as establishing an assessment and plan that was discussed personally with the patient.  > 50% of time was spent in direct patient care.  Length of Stay:  LOS: 2 days   Pixie Casino, MD, [facility], [place] Director of the Advanced Lipid Disorders &  Cardiovascular Risk Reduction Clinic Diplomate of the American Board of Clinical Lipidology Attending Cardiologist  Direct Dial: [phone]  Fax: [phone]  Website:  [email or web address] 05/03/2018, 12:00  PM

## 2018-05-03 NOTE — Progress Notes (Signed)
PROGRESS NOTE  [name] XFG:[id number] DOB: Oct 24, 1950 DOA: 05/01/2018 PCP: Biagio Borg, MD  HPI/Recap of past 24 hours: CARLINDA [name] is a 67 y.o. female with medical history significant for chronic diastolic CHF, hypertension, asthma not treated, OSA noncompliant with CPAP, type 2 diabetes, polyneuropathy, hyperlipidemia, who presented to ED [facility] brought in by her sister and brother-in-law due to worsening shortness of breath at rest.  Patient is alert and oriented x3.  Reports that in the last 2 months she has been having progressive worsening shortness of breath.  Associated with increasing abdominal girth and weight loss more than 20 pounds in the last 2 months.  Did not seek help for this but states that she has a PCP that she sees once a year.  Denies any chest pain or palpitations.  Denies melena, hematochezia or hematemesis.  Denies fever, chills, nausea, abdominal pain, diarrhea or constipation.  05/02/2018: Patient seen and examined at her bedside.  Reports she feels better post paracentesis with 6 L of fluid removed from her abdomen by interventional radiology on 05/01/2018.  Abdomen ultrasound revealed cirrhosis of the liver.  Hepatitis panel pending.  05/03/2018: Patient seen and examined at her bedside.  No acute events overnight.  States her breathing continues to improve with diuretics.  Denies any chest pain or palpitations.  Cardiology following.  Possible left heart cath in the near future.  Assessment/Plan: Principal Problem:   Acute systolic (congestive) heart failure (HCC) Active Problems:   Hyperlipidemia   Essential hypertension   Uncontrolled type 2 diabetes mellitus with diabetic neuropathy, without long-term current use of insulin (HCC)   Morbid obesity (HCC)   OSA (obstructive sleep apnea)   Anasarca   Cirrhosis of liver with ascites (HCC)  Acute on chronic combined systolic and diastolic CHF 2D echo done on 03/21/2016 revealed preserved LVEF 50 to 55% with  grade 1 diastolic dysfunction Repeated 2D echo done on 96/12/8936 revealed systolic CHF with LVEF 20 to 25% and grade 3 diastolic dysfunction. Cardiology consulted to further assess and to manage newly diagnosed systolic CHF. Currently on Lasix IV 40 mg daily and losartan 25 mg daily Possible right and left heart cath.  Cardiology following  Newly diagnosed systolic CHF The etiology of her worsening heart failure is unclear Cardiology following Medications as stated above  Newly diagnosed cirrhosis of the liver with ascites Findings per abdomen ultrasound, cirrhosis of the liver Negative hepatitis panel and negative HIV screening 6 L of fluid removed by interventional radiologist on 05/01/2018 On lactulose and Lasix Will need follow-up with GI  Hyperammonemia Continue lactulose 30 g daily Obtain ammonia level in the morning  Resolved hypokalemia post repletion  Chronic normocytic anemia Drop in hemoglobin from 11 to 9 possibly dilutional from fluid overload No sign of overt bleeding  Generalized weakness/physical debility/ambulatory dysfunction PT assessed and recommended home health PT Fall precautions Continue oral supplement   Code Status: Full code  Family Communication: Spoke with her son on the phone.  Updated him on her current medical status.  Disposition Plan: Home when hemodynamically stable or when cardiology signs off.   Consultants:  Radiology  Procedures:  Paracentesis by interventional radiology  Antimicrobials:  None  DVT prophylaxis: Subcu Lovenox daily   Objective: Vitals:   05/02/18 1946 05/03/18 0458 05/03/18 0501 05/03/18 1124  BP: 121/77 117/75  100/70  Pulse: (!) 101 94  89  Resp: 18 18    Temp: 98.8 F (37.1 C) 98 F (36.7 C)  TempSrc: Oral Oral    SpO2: 93% 94%  100%  Weight:   91.8 kg   Height:        Intake/Output Summary (Last 24 hours) at 05/03/2018 1551 Last data filed at 05/03/2018 1345 Gross per 24 hour  Intake  480 ml  Output 2800 ml  Net -2320 ml   Filed Weights   05/01/18 1835 05/02/18 0328 05/03/18 0501  Weight: 94.8 kg 94.5 kg 91.8 kg    Exam:  . General: 67 y.o. year-old female well-developed well-nourished no acute distress.  Alert and oriented x3.   . Cardiovascular: Regular rate and rhythm with no rubs or gallops.  No JVD or thyromegaly noted. Marland Kitchen Respiratory: Rest at bases with no wheezes.  Good inspiratory effort. . Abdomen: Soft nontender moderately distended with normal bowel sounds x4 quadrants. . Musculoskeletal: Improved pitting edema in lower extremities bilaterally.  2 out of 4 pulses in all 4 extremities. Marland Kitchen Psychiatry: Mood is appropriate for condition and setting   Data Reviewed: CBC: Recent Labs  Lab 05/01/18 1249 05/02/18 0613 05/03/18 0527  WBC 8.0 7.3 7.3  NEUTROABS  --  6.0 6.0  HGB 11.2* 9.7* 9.9*  HCT 35.5* 30.3* 32.3*  MCV 90.6 89.6 90.5  PLT 282 233 016   Basic Metabolic Panel: Recent Labs  Lab 05/01/18 1249 05/02/18 0613 05/03/18 0527  NA 136 137 136  K 2.9* 3.0* 3.5  CL 103 108 104  CO2 26 22 25   GLUCOSE 137* 141* 122*  BUN <5* <5* <5*  CREATININE 0.89 0.88 0.95  CALCIUM 8.9 8.3* 8.4*  MG  --  1.6* 1.9  PHOS  --   --  3.2   GFR: Estimated Creatinine Clearance: 65.6 mL/min (by C-G formula based on SCr of 0.95 mg/dL). Liver Function Tests: Recent Labs  Lab 05/01/18 1249  AST 22  ALT 9  ALKPHOS 99  BILITOT 2.1*  PROT 8.1  ALBUMIN 3.0*   Recent Labs  Lab 05/01/18 1249  LIPASE 26   Recent Labs  Lab 05/01/18 2206  AMMONIA 36*   Coagulation Profile: Recent Labs  Lab 05/01/18 1253  INR 1.43   Cardiac Enzymes: No results for input(s): CKTOTAL, CKMB, CKMBINDEX, TROPONINI in the last 168 hours. BNP (last 3 results) No results for input(s): PROBNP in the last 8760 hours. HbA1C: Recent Labs    05/01/18 2206  HGBA1C 5.2   CBG: Recent Labs  Lab 05/01/18 2015 05/01/18 2317 05/02/18 0540 05/03/18 1231  GLUCAP 130*  103* 126* 97   Lipid Profile: No results for input(s): CHOL, HDL, LDLCALC, TRIG, CHOLHDL, LDLDIRECT in the last 72 hours. Thyroid Function Tests: No results for input(s): TSH, T4TOTAL, FREET4, T3FREE, THYROIDAB in the last 72 hours. Anemia Panel: No results for input(s): VITAMINB12, FOLATE, FERRITIN, TIBC, IRON, RETICCTPCT in the last 72 hours. Urine analysis:    Component Value Date/Time   COLORURINE YELLOW 05/01/2018 1249   APPEARANCEUR CLEAR 05/01/2018 1249   LABSPEC 1.009 05/01/2018 1249   PHURINE 7.0 05/01/2018 1249   GLUCOSEU NEGATIVE 05/01/2018 1249   GLUCOSEU NEGATIVE 02/25/2018 1500   HGBUR NEGATIVE 05/01/2018 1249   BILIRUBINUR NEGATIVE 05/01/2018 1249   BILIRUBINUR negatvie 11/04/2016 1312   KETONESUR NEGATIVE 05/01/2018 1249   PROTEINUR 30 (A) 05/01/2018 1249   UROBILINOGEN 0.2 02/25/2018 1500   NITRITE NEGATIVE 05/01/2018 1249   LEUKOCYTESUR NEGATIVE 05/01/2018 1249   Sepsis Labs: @LABRCNTIP (procalcitonin:4,lacticidven:4)  ) Recent Results (from the past 240 hour(s))  Culture, body fluid-bottle     Status:  None (Preliminary result)   Collection Time: 05/01/18  5:23 PM  Result Value Ref Range Status   Specimen Description PERITONEAL  Final   Special Requests NONE  Final   Culture   Final    NO GROWTH 2 DAYS Performed at [facility], [address], [place], [place] [address]    Report Status PENDING  Incomplete  Gram stain     Status: None   Collection Time: 05/01/18  5:23 PM  Result Value Ref Range Status   Specimen Description PERITONEAL  Final   Special Requests NONE  Final   Gram Stain   Final    RARE WBC PRESENT, PREDOMINANTLY PMN NO ORGANISMS SEEN Performed at [facility], [place] [address] [place], [place] [address]    Report Status 05/01/2018 FINAL  Final      Studies: No results found.  Scheduled Meds: . enoxaparin (LOVENOX) injection  40 mg Subcutaneous Q24H  . feeding supplement (GLUCERNA SHAKE)  237 mL Oral TID BM  .  furosemide  40 mg Intravenous Daily  . gabapentin  300 mg Oral BID  . lactulose  30 g Oral Daily  . losartan  25 mg Oral Daily  . multivitamin with minerals  1 tablet Oral Daily    Continuous Infusions:   LOS: 2 days     Kayleen Memos, MD [facility] Pager [phone]  If 7PM-7AM, please contact night-coverage [email or web address] Password [facility] 05/03/2018, 3:51 PM

## 2018-05-03 NOTE — Progress Notes (Signed)
Pt reports vomiting in bathroom then chest pain 9/10 began  Pt lying in bed at this time  Vitals documented  EKG completed  Pt refusing IV Zofran, requesting ginger ale  Provided ginger ale  Paged MD [place] Crane  MD [place] Crane aware of new onset chest pain and vital signs  MD stated she will place orders  EKG placed in chart Primary RN at bedside as well  Will continue to monitor

## 2018-05-03 NOTE — Evaluation (Signed)
Physical Therapy Evaluation Patient Details Name: Gloria [name] MRN: [id number] DOB: Jul 21, 1950 Today's Date: 05/03/2018   History of Present Illness  67 y.o. female with medical history significant for chronic diastolic CHF, hypertension, asthma not treated, OSA noncompliant with CPAP, type 2 diabetes, polyneuropathy, hyperlipidemia, who presented to ED with c/o dypsnea.  Pt s/p paracentesis with 6L fluid removal on 11/1.     Clinical Impression  Pt admitted with above diagnosis. Pt currently with functional limitations due to the deficits listed below (see PT Problem List). PTA pt lived alone independent with mobility. On eval, she required min assist bed mobility, min assist sit to stand, and min guard assist ambulation 100 feet with RW.  Pt will benefit from skilled PT to increase their independence and safety with mobility to allow discharge to the venue listed below.       Follow Up Recommendations Home health PT;Supervision - Intermittent    Equipment Recommendations  Rolling walker with 5" wheels    Recommendations for Other Services       Precautions / Restrictions Precautions Precautions: Other (comment) Precaution Comments: watch sats Restrictions Weight Bearing Restrictions: No      Mobility  Bed Mobility Overal bed mobility: Needs Assistance Bed Mobility: Supine to Sit;Sit to Supine     Supine to sit: Min assist;HOB elevated Sit to supine: Min assist;HOB elevated   General bed mobility comments: +rail, assist to elevate trunk during sup to sit, assist with BLE during sit to sup  Transfers Overall transfer level: Needs assistance Equipment used: Rolling walker (2 wheeled) Transfers: Sit to/from Stand Sit to Stand: From elevated surface;Min assist         General transfer comment: cues for hand placement, increased time and effort  Ambulation/Gait Ambulation/Gait assistance: Min guard Gait Distance (Feet): 100 Feet Assistive device: Rolling walker (2  wheeled) Gait Pattern/deviations: Step-through pattern;Decreased stride length Gait velocity: decreased Gait velocity interpretation: <1.31 ft/sec, indicative of household ambulator General Gait Details: steady gait with RW, fatigues quickly, ambulated on RA with SpO2 >90%  Stairs            Wheelchair Mobility    Modified Rankin (Stroke Patients Only)       Balance Overall balance assessment: Mild deficits observed, not formally tested                                           Pertinent Vitals/Pain Pain Assessment: No/denies pain    Home Living Family/patient expects to be discharged to:: Private residence Living Arrangements: Alone Available Help at Discharge: Family;Available 24 hours/day Type of Home: House Home Access: Level entry     Home Layout: One level Home Equipment: None      Prior Function Level of Independence: Independent               Hand Dominance        Extremity/Trunk Assessment   Upper Extremity Assessment Upper Extremity Assessment: Generalized weakness    Lower Extremity Assessment Lower Extremity Assessment: Generalized weakness    Cervical / Trunk Assessment Cervical / Trunk Assessment: Normal  Communication   Communication: No difficulties  Cognition Arousal/Alertness: Awake/alert Behavior During Therapy: Flat affect Overall Cognitive Status: Within Functional Limits for tasks assessed  General Comments: Pt mildly agitated. Encouragement needed to participate in eval. Minimal effort      General Comments      Exercises     Assessment/Plan    PT Assessment Patient needs continued PT services  PT Problem List Decreased strength;Decreased mobility;Decreased activity tolerance;Cardiopulmonary status limiting activity;Decreased balance;Decreased knowledge of use of DME       PT Treatment Interventions DME instruction;Therapeutic activities;Gait  training;Therapeutic exercise;Patient/family education;Balance training;Functional mobility training    PT Goals (Current goals can be found in the Care Plan section)  Acute Rehab PT Goals Patient Stated Goal: not stated PT Goal Formulation: With patient Time For Goal Achievement: 05/17/18 Potential to Achieve Goals: Good    Frequency Min 3X/week   Barriers to discharge        Co-evaluation               AM-PAC PT "6 Clicks" Daily Activity  Outcome Measure Difficulty turning over in bed (including adjusting bedclothes, sheets and blankets)?: A Little Difficulty moving from lying on back to sitting on the side of the bed? : Unable Difficulty sitting down on and standing up from a chair with arms (e.g., wheelchair, bedside commode, etc,.)?: A Lot Help needed moving to and from a bed to chair (including a wheelchair)?: A Little Help needed walking in hospital room?: A Little Help needed climbing 3-5 steps with a railing? : A Lot 6 Click Score: 14    End of Session Equipment Utilized During Treatment: Gait belt Activity Tolerance: Patient limited by fatigue Patient left: in bed;with call bell/phone within reach Nurse Communication: Mobility status PT Visit Diagnosis: Difficulty in walking, not elsewhere classified (R26.2);Muscle weakness (generalized) (M62.81)    Time: 0349-1791 PT Time Calculation (min) (ACUTE ONLY): 18 min   Charges:   PT Evaluation $PT Eval Low Complexity: 1 Low          Lorrin Goodell, PT  Office # [phone] Pager [phone]   Lorriane Shire 05/03/2018, 2:46 PM

## 2018-05-03 NOTE — Progress Notes (Signed)
MD ordered Morphine - for chest pain.   Pt refused zofran for nausea and nitro for chest pain.

## 2018-05-04 DIAGNOSIS — E785 Hyperlipidemia, unspecified: Secondary | ICD-10-CM

## 2018-05-04 LAB — GLUCOSE, CAPILLARY: Glucose-Capillary: 115 mg/dL — ABNORMAL HIGH (ref 70–99)

## 2018-05-04 LAB — PH, BODY FLUID: pH, Body Fluid: 7.7

## 2018-05-04 MED ORDER — POTASSIUM CHLORIDE CRYS ER 20 MEQ PO TBCR
40.0000 meq | EXTENDED_RELEASE_TABLET | Freq: Every day | ORAL | Status: DC
  Administered 2018-05-04 – 2018-05-11 (×8): 40 meq via ORAL
  Filled 2018-05-04 (×9): qty 2

## 2018-05-04 MED ORDER — OXYCODONE HCL 5 MG PO TABS
5.0000 mg | ORAL_TABLET | Freq: Four times a day (QID) | ORAL | Status: DC | PRN
  Administered 2018-05-04: 5 mg via ORAL
  Filled 2018-05-04: qty 1

## 2018-05-04 NOTE — Progress Notes (Addendum)
Progress Note  Patient Name: Gloria [name] Date of Encounter: 05/04/2018  Primary Cardiologist:  Skeet Latch, MD  Subjective   Having pain at 10/10. Has been at this level since yesterday. Hydrocodone helps. Chest wall a little tender. Same as admission pain. No SOB.   Inpatient Medications    Scheduled Meds: . enoxaparin (LOVENOX) injection  40 mg Subcutaneous Q24H  . feeding supplement (GLUCERNA SHAKE)  237 mL Oral TID BM  . furosemide  40 mg Intravenous Daily  . gabapentin  300 mg Oral BID  . lactulose  30 g Oral Daily  . losartan  25 mg Oral Daily  . multivitamin with minerals  1 tablet Oral Daily   Continuous Infusions:  PRN Meds: diphenhydrAMINE, iopamidol, labetalol, ondansetron (ZOFRAN) IV, triamcinolone cream   Vital Signs    Vitals:   05/03/18 1124 05/03/18 1710 05/03/18 1946 05/04/18 0611  BP: 100/70 117/80 117/76 (!) 102/53  Pulse: 89 90 89 93  Resp:  19 16 16   Temp:   98.1 F (36.7 C) 97.9 F (36.6 C)  TempSrc:   Oral Oral  SpO2: 100% 100% 98% 93%  Weight:    90.7 kg  Height:        Intake/Output Summary (Last 24 hours) at 05/04/2018 0845 Last data filed at 05/04/2018 0132 Gross per 24 hour  Intake 960 ml  Output 1200 ml  Net -240 ml   Filed Weights   05/02/18 0328 05/03/18 0501 05/04/18 0611  Weight: 94.5 kg 91.8 kg 90.7 kg    Telemetry    SR, no ectopy in > 24 hr - Personally Reviewed  ECG    None today - Personally Reviewed  Physical Exam   General: Well developed, well nourished, female appearing in no acute distress. Head: Normocephalic, atraumatic.  Neck: Supple without bruits, JVD minimal elevation. Lungs:  Resp regular and unlabored, CTA. Heart: RRR, S1, S2, no S3, S4, or murmur; no rub. Abdomen: Soft, non-tender, non-distended with normoactive bowel sounds. No hepatomegaly. No rebound/guarding. No obvious abdominal masses. Extremities: No clubbing, cyanosis, no edema. Distal pedal pulses are 2+ bilaterally. Neuro:  Alert and oriented X 3. Moves all extremities spontaneously. Psych: Normal affect.  Labs    Hematology Recent Labs  Lab 05/01/18 1249 05/02/18 0613 05/03/18 0527  WBC 8.0 7.3 7.3  RBC 3.92 3.38* 3.57*  HGB 11.2* 9.7* 9.9*  HCT 35.5* 30.3* 32.3*  MCV 90.6 89.6 90.5  MCH 28.6 28.7 27.7  MCHC 31.5 32.0 30.7  RDW 14.6 14.6 14.6  PLT 282 233 258    Chemistry Recent Labs  Lab 05/01/18 1249 05/02/18 0613 05/03/18 0527  NA 136 137 136  K 2.9* 3.0* 3.5  CL 103 108 104  CO2 26 22 25   GLUCOSE 137* 141* 122*  BUN <5* <5* <5*  CREATININE 0.89 0.88 0.95  CALCIUM 8.9 8.3* 8.4*  PROT 8.1  --   --   ALBUMIN 3.0*  --   --   AST 22  --   --   ALT 9  --   --   ALKPHOS 99  --   --   BILITOT 2.1*  --   --   GFRNONAA >60 >60 >60  GFRAA >60 >60 >60  ANIONGAP 7 7 7      Cardiac EnzymesNo results for input(s): TROPONINI in the last 168 hours.  Recent Labs  Lab 05/01/18 1326  TROPIPOC 0.00     BNP Recent Labs  Lab 05/01/18 1247 05/01/18 2206 05/02/18 2979  BNP 1,514.4* 1,504.5* 1,255.5*    Lab Results  Component Value Date   CHOL 86 02/25/2018   HDL 24.00 (L) 02/25/2018   LDLCALC 43 02/25/2018   LDLDIRECT 64.0 10/03/2015   TRIG 96.0 02/25/2018   CHOLHDL 4 02/25/2018    Radiology    Dg Chest 2 View  Result Date: 05/01/2018 CLINICAL DATA:  Shortness of breath for 1 month EXAM: CHEST - 2 VIEW COMPARISON:  02/25/2018 FINDINGS: Cardiac shadow is enlarged. The overall inspiratory effort is poor without focal infiltrate or sizable effusion. Degenerative changes of thoracic spine are noted. IMPRESSION: Poor inspiratory effort.  No acute abnormality seen. Electronically Signed   By: Inez Catalina M.D.   On: 05/01/2018 14:41   Ct Angio Chest Pe W And/or Wo Contrast  Result Date: 05/01/2018 CLINICAL DATA:  Abdominal pain and swelling EXAM: CT ANGIOGRAPHY CHEST CT ABDOMEN AND PELVIS WITH CONTRAST TECHNIQUE: Multidetector CT imaging of the chest was performed using the standard  protocol during bolus administration of intravenous contrast. Multiplanar CT image reconstructions and MIPs were obtained to evaluate the vascular anatomy. Multidetector CT imaging of the abdomen and pelvis was performed using the standard protocol during bolus administration of intravenous contrast. CONTRAST:  149mL ISOVUE-370 IOPAMIDOL (ISOVUE-370) INJECTION 76% COMPARISON:  None. FINDINGS: CTA CHEST FINDINGS Cardiovascular: There is incomplete coverage of the lower chest such that lower lobe segmental and subsegmental branches are incompletely covered. Reportedly this coverage is related to patient movement after scout acquisition. No evidence of pulmonary embolism. Cardiomegaly without pericardial effusion. The heart is incompletely covered. Limited aortic opacification. Mediastinum/Nodes: Negative for thoracic adenopathy. There is bilateral axillary adenopathy that is mild and likely related to patient's anasarca. Lungs/Pleura: Most convincing on coronal reformats there is interlobular septal thickening that is mild. No alveolar edema or pneumonia. Musculoskeletal: No acute finding. Review of the MIP images confirms the above findings. CT ABDOMEN and PELVIS FINDINGS Hepatobiliary: Heterogeneous enhancement the liver that resolves on delayed phase, with "nutmeg" appearance, likely passive congestion. There is no definitive changes of cirrhosis. No evidence of biliary obstruction or stone. Pancreas: Unremarkable. Spleen: Unremarkable. Adrenals/Urinary Tract: Negative adrenals. No hydronephrosis or stone. Unremarkable bladder. Stomach/Bowel:  No obstruction. No inflammatory changes. Vascular/Lymphatic: Atherosclerotic calcification. No acute vascular finding. The portal venous system is patent. No mass or adenopathy. Reproductive:Hysterectomy Other: Massive ascites. Mild reticulation of the greater omentum is likely edema. There is no peritoneal nodularity or septation. Musculoskeletal: Degenerative changes  without acute or aggressive finding Please note that axial portal venous phase images are only through a partial volume such that some of the abdominal structures have to be reviewed on reformatted images. Image repair is still underway; the study will be signed for expeditious patient care. Review of the MIP images confirms the above findings. IMPRESSION: 1. Massive ascites and generalized anasarca. Heterogeneous liver likely from passive congestion and a cardiac cause is favored. 2. Cardiomegaly. 3. Minimal interstitial pulmonary edema. 4. Negative for pulmonary embolism. Note that the lower lobe distal segmental and subsegmental vessels are incompletely covered due to patient motion. Electronically Signed   By: Monte Fantasia M.D.   On: 05/01/2018 15:25   US Abdomen Complete  Result Date: 05/01/2018 CLINICAL DATA:  67 y/o  F; cirrhosis with ascites. EXAM: ABDOMEN ULTRASOUND COMPLETE COMPARISON:  05/01/2018 CT abdomen and pelvis. FINDINGS: Gallbladder: Gallbladder sludge and small stones. No gallbladder wall thickening. Negative sonographic Murphy's sign. Common bile duct: Diameter: 3 mm Liver: Diffuse heterogeneity of the liver compatible with cirrhosis. No focal liver abnormality  identified. Portal vein is patent on color Doppler imaging with normal direction of blood flow towards the liver. IVC: No abnormality visualized. Pancreas: Visualized portion unremarkable. Spleen: Size and appearance within normal limits. Right Kidney: Length: 10.3 cm. Echogenicity within normal limits. No mass or hydronephrosis visualized. Left Kidney: Length: 9.5 cm. Echogenicity within normal limits. No mass or hydronephrosis visualized. Abdominal aorta: No aneurysm visualized. Other findings: Perihepatic ascites. IMPRESSION: 1. Gallbladder sludge and small stones. 2. Cirrhotic liver. 3. Perihepatic ascites. 4. No acute process identified. Electronically Signed   By: Kristine Garbe M.D.   On: 05/01/2018 18:18   Ct  Abdomen Pelvis W Contrast  Result Date: 05/01/2018 CLINICAL DATA:  Abdominal pain and swelling EXAM: CT ANGIOGRAPHY CHEST CT ABDOMEN AND PELVIS WITH CONTRAST TECHNIQUE: Multidetector CT imaging of the chest was performed using the standard protocol during bolus administration of intravenous contrast. Multiplanar CT image reconstructions and MIPs were obtained to evaluate the vascular anatomy. Multidetector CT imaging of the abdomen and pelvis was performed using the standard protocol during bolus administration of intravenous contrast. CONTRAST:  128mL ISOVUE-370 IOPAMIDOL (ISOVUE-370) INJECTION 76% COMPARISON:  None. FINDINGS: CTA CHEST FINDINGS Cardiovascular: There is incomplete coverage of the lower chest such that lower lobe segmental and subsegmental branches are incompletely covered. Reportedly this coverage is related to patient movement after scout acquisition. No evidence of pulmonary embolism. Cardiomegaly without pericardial effusion. The heart is incompletely covered. Limited aortic opacification. Mediastinum/Nodes: Negative for thoracic adenopathy. There is bilateral axillary adenopathy that is mild and likely related to patient's anasarca. Lungs/Pleura: Most convincing on coronal reformats there is interlobular septal thickening that is mild. No alveolar edema or pneumonia. Musculoskeletal: No acute finding. Review of the MIP images confirms the above findings. CT ABDOMEN and PELVIS FINDINGS Hepatobiliary: Heterogeneous enhancement the liver that resolves on delayed phase, with "nutmeg" appearance, likely passive congestion. There is no definitive changes of cirrhosis. No evidence of biliary obstruction or stone. Pancreas: Unremarkable. Spleen: Unremarkable. Adrenals/Urinary Tract: Negative adrenals. No hydronephrosis or stone. Unremarkable bladder. Stomach/Bowel:  No obstruction. No inflammatory changes. Vascular/Lymphatic: Atherosclerotic calcification. No acute vascular finding. The portal venous  system is patent. No mass or adenopathy. Reproductive:Hysterectomy Other: Massive ascites. Mild reticulation of the greater omentum is likely edema. There is no peritoneal nodularity or septation. Musculoskeletal: Degenerative changes without acute or aggressive finding Please note that axial portal venous phase images are only through a partial volume such that some of the abdominal structures have to be reviewed on reformatted images. Image repair is still underway; the study will be signed for expeditious patient care. Review of the MIP images confirms the above findings. IMPRESSION: 1. Massive ascites and generalized anasarca. Heterogeneous liver likely from passive congestion and a cardiac cause is favored. 2. Cardiomegaly. 3. Minimal interstitial pulmonary edema. 4. Negative for pulmonary embolism. Note that the lower lobe distal segmental and subsegmental vessels are incompletely covered due to patient motion. Electronically Signed   By: Monte Fantasia M.D.   On: 05/01/2018 15:25   US Paracentesis  Result Date: 05/02/2018 INDICATION: Shortness of breath. Abdominal distention. Ascites. Request for diagnostic and therapeutic paracentesis. EXAM: ULTRASOUND GUIDED LEFT LOWER QUADRANT PARACENTESIS MEDICATIONS: None. COMPLICATIONS: None immediate. PROCEDURE: Informed written consent was obtained from the patient after a discussion of the risks, benefits and alternatives to treatment. A timeout was performed prior to the initiation of the procedure. Initial ultrasound scanning demonstrates a large amount of ascites within the left lower abdominal quadrant. The left lower abdomen was prepped and draped in  the usual sterile fashion. 1% lidocaine with epinephrine was used for local anesthesia. Following this, a 19 gauge, 7-cm, Yueh catheter was introduced. An ultrasound image was saved for documentation purposes. The paracentesis was performed. The catheter was removed and a dressing was applied. The patient  tolerated the procedure well without immediate post procedural complication. FINDINGS: A total of approximately 6 L of clear yellow fluid was removed. Samples were sent to the laboratory as requested by the clinical team. IMPRESSION: Successful ultrasound-guided paracentesis yielding 6 liters of peritoneal fluid. Read by: Ascencion Dike PA-C Electronically Signed   By: Corrie Mckusick D.O.   On: 05/01/2018 17:41     Cardiac Studies   Echo 05/02/2018: Study Conclusions - Left ventricle: The cavity size was mildly dilated. Wall thickness was increased in a pattern of mild LVH. Systolic function was severely reduced. The estimated ejection fraction was in the range of 20% to 25%. Diffuse hypokinesis. Doppler parameters are consistent with restrictive left ventricular relaxation (grade 3 diastolic dysfunction). The E/e&' ratio is >15, suggesting elevated LV filling pressure. - Mitral valve: Mildly thickened leaflets . There was moderate regurgitation. - Left atrium: The atrium was mildly dilated. - Right ventricle: The cavity size was mildly dilated. Mildly reduced systolic dysfunction. - Tricuspid valve: There was moderate regurgitation. - Pulmonic valve: There was mild regurgitation. - Pulmonary arteries: PA peak pressure: 44 mm Hg (S). - Inferior vena cava: The vessel was dilated. The respirophasic diameter changes were blunted (<50%), consistent with elevated central venous pressure. - Pericardium, extracardiac: A trivial pericardial effusion was identified. Impressions: - Compared to a prior study in 2017, the LVEF is severely reduced to 20-25%.  Patient Profile     67 y.o. female w/ hx mild HFrEF, HLD, HTN, DM, depression, who was admitted 11/01 with dyspnea who is being seen today for the evaluation of newly reduced EF, acute S-CHF.  Assessment & Plan     1. Acute systolic CHF:   -- HFrEF is worse than previous by echo, no WMA mentioned -- s/p  paracentesis of 6 L, cause of cirrhosis may be CHF -- MV 2017 was low risk w/ EF 45% and breast attn -- discuss if ischemic eval needed and its timing  -- could do R/L heart cath in a day or so. -- continue Lasix 40 mg IV qd for now -- may be able to change to po in a day or so -- started on losartan 25 mg 11/02,  -- SBP 100s-110s, discuss adding BB w/ MD -- may be able to change losartan to [facility] or add BB/nitrates/hydralazine, but BP will limit this, could try adding Coreg 3.125 mg bid  -- supplement K+ -- consider Januvia for DM mgt.   Otherwise, per IM Principal Problem:   Acute systolic (congestive) heart failure (HCC) Active Problems:   Hyperlipidemia   Essential hypertension   Uncontrolled type 2 diabetes mellitus with diabetic neuropathy, without long-term current use of insulin ([place])   Morbid obesity (HCC)   OSA (obstructive sleep apnea)   Anasarca   Cirrhosis of liver with ascites ([place])   Signed, Rosaria Ferries , PA-C 8:45 AM 05/04/2018 Pager: [phone]  I have examined the patient and reviewed assessment and plan and discussed with patient.  Agree with above as stated.  Plan for right and left heart cath to eval cause for ischemia She is agreeable.  Unclear why her LV function has dropped. Some RF for CAD.   Exam shows a distended abdomen.  SHe has  had recent paracentesis.  Renal function is stable.  Check BMet in AM.   Larae Grooms

## 2018-05-04 NOTE — Progress Notes (Signed)
PROGRESS NOTE  Gloria [name] MEQ:[id number] DOB: 1950/10/08 DOA: 05/01/2018 PCP: Biagio Borg, MD  HPI/Recap of past 24 hours: Gloria [name] is a 67 y.o. female with medical history significant for chronic diastolic CHF, hypertension, asthma not treated, OSA noncompliant with CPAP, type 2 diabetes, polyneuropathy, hyperlipidemia, who presented to ED [facility] brought in by her sister and brother-in-law due to worsening shortness of breath at rest.  Patient is alert and oriented x3.  Reports that in the last 2 months she has been having progressive worsening shortness of breath.  Associated with increasing abdominal girth and weight loss more than 20 pounds in the last 2 months.  Did not seek help for this but states that she has a PCP that she sees once a year.  Denies any chest pain or palpitations.  Denies melena, hematochezia or hematemesis.  Denies fever, chills, nausea, abdominal pain, diarrhea or constipation.  05/02/2018: Patient seen and examined at her bedside.  Reports she feels better post paracentesis with 6 L of fluid removed from her abdomen by interventional radiology on 05/01/2018.  Abdomen ultrasound revealed cirrhosis of the liver.  Hepatitis panel pending.  05/03/2018: Patient seen and examined at her bedside.  No acute events overnight.  States her breathing continues to improve with diuretics.  Denies any chest pain or palpitations.  Cardiology following.  Possible left heart cath in the near future.  05/04/2018: Patient seen and examined at bedside.  Reports chest pain 10 out of 10 reproducible on palpation and improved with opiates.  From 10 out of 10 to 0 out of 10 after taking opiates.  Her son expressed concern for opiate dependence.  Assessment/Plan: Principal Problem:   Acute systolic (congestive) heart failure (HCC) Active Problems:   Hyperlipidemia   Essential hypertension   Uncontrolled type 2 diabetes mellitus with diabetic neuropathy, without long-term current use of  insulin (HCC)   Morbid obesity (HCC)   OSA (obstructive sleep apnea)   Anasarca   Cirrhosis of liver with ascites (HCC)  Acute on chronic combined systolic and diastolic CHF 2D echo done on 03/21/2016 revealed preserved LVEF 50 to 55% with grade 1 diastolic dysfunction Repeated 2D echo done on 29/12/9890 revealed systolic CHF with LVEF 20 to 25% and grade 3 diastolic dysfunction. Cardiology consulted to further assess and to manage newly diagnosed systolic CHF. Currently on Lasix IV 40 mg daily and losartan 25 mg daily Possible right and left heart cath.  Cardiology following  Newly diagnosed systolic CHF The etiology of her worsening heart failure is unclear Cardiology following Medications as stated above  Newly diagnosed cirrhosis of the liver with ascites Findings per abdomen ultrasound, cirrhosis of the liver Negative hepatitis panel and negative HIV screening 6 L of fluid removed by interventional radiologist on 05/01/2018 On lactulose and Lasix Will need follow-up with GI  Hyperammonemia Continue lactulose 30 g daily Obtain ammonia level in the morning  Resolved hypokalemia post repletion  Chronic normocytic anemia Drop in hemoglobin from 11 to 9 possibly dilutional from fluid overload No sign of overt bleeding  Generalized weakness/physical debility/ambulatory dysfunction PT assessed and recommended home health PT Fall precautions Continue oral supplement   Code Status: Full code  Family Communication: Spoke with her son on the phone.  Updated him on her current medical status.  Disposition Plan: Home when hemodynamically stable or when cardiology signs off.   Consultants:  Radiology  Procedures:  Paracentesis by interventional radiology  Antimicrobials:  None  DVT prophylaxis: Subcu Lovenox  daily   Objective: Vitals:   05/03/18 1124 05/03/18 1710 05/03/18 1946 05/04/18 0611  BP: 100/70 117/80 117/76 (!) 102/53  Pulse: 89 90 89 93  Resp:  19  16 16   Temp:   98.1 F (36.7 C) 97.9 F (36.6 C)  TempSrc:   Oral Oral  SpO2: 100% 100% 98% 93%  Weight:    90.7 kg  Height:        Intake/Output Summary (Last 24 hours) at 05/04/2018 1240 Last data filed at 05/04/2018 1100 Gross per 24 hour  Intake 720 ml  Output 1300 ml  Net -580 ml   Filed Weights   05/02/18 0328 05/03/18 0501 05/04/18 0611  Weight: 94.5 kg 91.8 kg 90.7 kg    Exam:  . General: 67 y.o. year-old female well-developed well-nourished in no acute distress.  Alert and oriented x3.   . Cardiovascular: Regular rate and rhythm with no rubs or gallops.  No JVD or thyromegaly noted. Marland Kitchen Respiratory: Clear to auscultation with no wheezes or rales.  Good inspiratory effort. . Abdomen: Soft nontender moderately distended with normal bowel sounds x4 quadrants. . Musculoskeletal: Improved pitting edema in lower extremities bilaterally.  2 out of 4 pulses in all 4 extremities. Marland Kitchen Psychiatry: Mood is appropriate for condition and setting   Data Reviewed: CBC: Recent Labs  Lab 05/01/18 1249 05/02/18 0613 05/03/18 0527  WBC 8.0 7.3 7.3  NEUTROABS  --  6.0 6.0  HGB 11.2* 9.7* 9.9*  HCT 35.5* 30.3* 32.3*  MCV 90.6 89.6 90.5  PLT 282 233 357   Basic Metabolic Panel: Recent Labs  Lab 05/01/18 1249 05/02/18 0613 05/03/18 0527  NA 136 137 136  K 2.9* 3.0* 3.5  CL 103 108 104  CO2 26 22 25   GLUCOSE 137* 141* 122*  BUN <5* <5* <5*  CREATININE 0.89 0.88 0.95  CALCIUM 8.9 8.3* 8.4*  MG  --  1.6* 1.9  PHOS  --   --  3.2   GFR: Estimated Creatinine Clearance: 65.2 mL/min (by C-G formula based on SCr of 0.95 mg/dL). Liver Function Tests: Recent Labs  Lab 05/01/18 1249  AST 22  ALT 9  ALKPHOS 99  BILITOT 2.1*  PROT 8.1  ALBUMIN 3.0*   Recent Labs  Lab 05/01/18 1249  LIPASE 26   Recent Labs  Lab 05/01/18 2206  AMMONIA 36*   Coagulation Profile: Recent Labs  Lab 05/01/18 1253  INR 1.43   Cardiac Enzymes: No results for input(s): CKTOTAL, CKMB,  CKMBINDEX, TROPONINI in the last 168 hours. BNP (last 3 results) No results for input(s): PROBNP in the last 8760 hours. HbA1C: Recent Labs    05/01/18 2206  HGBA1C 5.2   CBG: Recent Labs  Lab 05/01/18 2015 05/01/18 2317 05/02/18 0540 05/03/18 0741 05/03/18 1231  GLUCAP 130* 103* 126* 115* 97   Lipid Profile: No results for input(s): CHOL, HDL, LDLCALC, TRIG, CHOLHDL, LDLDIRECT in the last 72 hours. Thyroid Function Tests: No results for input(s): TSH, T4TOTAL, FREET4, T3FREE, THYROIDAB in the last 72 hours. Anemia Panel: No results for input(s): VITAMINB12, FOLATE, FERRITIN, TIBC, IRON, RETICCTPCT in the last 72 hours. Urine analysis:    Component Value Date/Time   COLORURINE YELLOW 05/01/2018 1249   APPEARANCEUR CLEAR 05/01/2018 1249   LABSPEC 1.009 05/01/2018 1249   PHURINE 7.0 05/01/2018 1249   GLUCOSEU NEGATIVE 05/01/2018 1249   GLUCOSEU NEGATIVE 02/25/2018 1500   HGBUR NEGATIVE 05/01/2018 [place] 05/01/2018 1249   BILIRUBINUR negatvie 11/04/2016 1312  KETONESUR NEGATIVE 05/01/2018 1249   PROTEINUR 30 (A) 05/01/2018 1249   UROBILINOGEN 0.2 02/25/2018 1500   NITRITE NEGATIVE 05/01/2018 1249   LEUKOCYTESUR NEGATIVE 05/01/2018 1249   Sepsis Labs: @LABRCNTIP (procalcitonin:4,lacticidven:4)  ) Recent Results (from the past 240 hour(s))  Culture, body fluid-bottle     Status: None (Preliminary result)   Collection Time: 05/01/18  5:23 PM  Result Value Ref Range Status   Specimen Description PERITONEAL  Final   Special Requests NONE  Final   Culture   Final    NO GROWTH 2 DAYS Performed at [facility], [place] [address], [place], [place] [address]    Report Status PENDING  Incomplete  Gram stain     Status: None   Collection Time: 05/01/18  5:23 PM  Result Value Ref Range Status   Specimen Description PERITONEAL  Final   Special Requests NONE  Final   Gram Stain   Final    RARE WBC PRESENT, PREDOMINANTLY PMN NO ORGANISMS  SEEN Performed at [facility], [place] [address], [place], [place] [address]    Report Status 05/01/2018 FINAL  Final      Studies: No results found.  Scheduled Meds: . enoxaparin (LOVENOX) injection  40 mg Subcutaneous Q24H  . feeding supplement (GLUCERNA SHAKE)  237 mL Oral TID BM  . furosemide  40 mg Intravenous Daily  . gabapentin  300 mg Oral BID  . lactulose  30 g Oral Daily  . losartan  25 mg Oral Daily  . multivitamin with minerals  1 tablet Oral Daily  . potassium chloride  40 mEq Oral Daily    Continuous Infusions:   LOS: 3 days     Kayleen Memos, MD [facility] Pager [phone]  If 7PM-7AM, please contact night-coverage [email or web address] Password [email or web address] 05/04/2018, 12:40 PM

## 2018-05-04 NOTE — Progress Notes (Signed)
PT Cancellation Note  Patient Details Name: Gloria [name] MRN: [id number] DOB: 01-30-1951   Cancelled Treatment:    Reason Eval/Treat Not Completed: Pain limiting ability to participate. Pt declining participation in therapy today due to pain. Pt reporting 9/10 chest pain. RN aware and pain is well documented in chart. PT to re-attempt as time allows.   Lorriane Shire 05/04/2018, 12:54 PM   Lorrin Goodell, PT  Office # [phone] Pager [phone]

## 2018-05-05 ENCOUNTER — Encounter (HOSPITAL_COMMUNITY)
Admission: EM | Disposition: A | Discharge status: Home / Self Care | Payer: Self-pay | Source: Home / Self Care | Attending: Internal Medicine

## 2018-05-05 DIAGNOSIS — R079 Chest pain, unspecified: Secondary | ICD-10-CM

## 2018-05-05 DIAGNOSIS — I5043 Acute on chronic combined systolic (congestive) and diastolic (congestive) heart failure: Secondary | ICD-10-CM

## 2018-05-05 HISTORY — PX: RIGHT/LEFT HEART CATH AND CORONARY ANGIOGRAPHY: CATH118266

## 2018-05-05 LAB — BASIC METABOLIC PANEL
Anion gap: 8 (ref 5–15)
BUN: <5 mg/dL — ABNORMAL LOW (ref 8–23)
CO2: 26 mmol/L (ref 22–32)
Calcium: 8.7 mg/dL — ABNORMAL LOW (ref 8.9–10.3)
Chloride: 102 mmol/L (ref 98–111)
Creatinine, Ser: 0.86 mg/dL (ref 0.44–1.00)
GFR calc Af Amer: >60 mL/min (ref >60)
GFR calc non Af Amer: >60 mL/min (ref >60)
Glucose, Bld: 94 mg/dL (ref 70–99)
Potassium: 3.9 mmol/L (ref 3.5–5.1)
Sodium: 136 mmol/L (ref 135–145)

## 2018-05-05 LAB — POCT I-STAT 3, VENOUS BLOOD GAS (G3P V)
Acid-Base Excess: 3 mmol/L — ABNORMAL HIGH (ref 0.0–2.0)
Acid-Base Excess: 4 mmol/L — ABNORMAL HIGH (ref 0.0–2.0)
Acid-Base Excess: 5 mmol/L — ABNORMAL HIGH (ref 0.0–2.0)
Bicarbonate: 29.1 mmol/L — ABNORMAL HIGH (ref 20.0–28.0)
Bicarbonate: 29.6 mmol/L — ABNORMAL HIGH (ref 20.0–28.0)
Bicarbonate: 30.5 mmol/L — ABNORMAL HIGH (ref 20.0–28.0)
O2 Saturation: 63 %
O2 Saturation: 66 %
O2 Saturation: 94 %
TCO2: 31 mmol/L (ref 22–32)
TCO2: 31 mmol/L (ref 22–32)
TCO2: 32 mmol/L (ref 22–32)
pCO2, Ven: 48.9 mmHg (ref 44.0–60.0)
pCO2, Ven: 49.5 mmHg (ref 44.0–60.0)
pCO2, Ven: 49.8 mmHg (ref 44.0–60.0)
pH, Ven: 7.381 (ref 7.250–7.430)
pH, Ven: 7.383 (ref 7.250–7.430)
pH, Ven: 7.398 (ref 7.250–7.430)
pO2, Ven: 34 mmHg (ref 32.0–45.0)
pO2, Ven: 35 mmHg (ref 32.0–45.0)
pO2, Ven: 74 mmHg — ABNORMAL HIGH (ref 32.0–45.0)

## 2018-05-05 LAB — CBC
HCT: 33.3 % — ABNORMAL LOW (ref 36.0–46.0)
Hemoglobin: 10.2 g/dL — ABNORMAL LOW (ref 12.0–15.0)
MCH: 27.9 pg (ref 26.0–34.0)
MCHC: 30.6 g/dL (ref 30.0–36.0)
MCV: 91 fL (ref 80.0–100.0)
Platelets: 279 10*3/uL (ref 150–400)
RBC: 3.66 MIL/uL — ABNORMAL LOW (ref 3.87–5.11)
RDW: 14.7 % (ref 11.5–15.5)
WBC: 8.1 10*3/uL (ref 4.0–10.5)
nRBC: 0 % (ref 0.0–0.2)

## 2018-05-05 SURGERY — RIGHT/LEFT HEART CATH AND CORONARY ANGIOGRAPHY
Anesthesia: LOCAL

## 2018-05-05 MED ORDER — ASPIRIN 81 MG PO CHEW
81.0000 mg | CHEWABLE_TABLET | ORAL | Status: AC
  Administered 2018-05-05: 81 mg via ORAL
  Filled 2018-05-05: qty 1

## 2018-05-05 MED ORDER — ONDANSETRON HCL 4 MG/2ML IJ SOLN
4.0000 mg | Freq: Four times a day (QID) | INTRAMUSCULAR | Status: DC | PRN
  Administered 2018-05-05: 4 mg via INTRAVENOUS

## 2018-05-05 MED ORDER — IOHEXOL 350 MG/ML SOLN
INTRAVENOUS | Status: DC | PRN
  Administered 2018-05-05: 55 mL

## 2018-05-05 MED ORDER — SODIUM CHLORIDE 0.9% FLUSH: 3.0000 mL | Freq: Two times a day (BID) | INTRAVENOUS | Status: DC

## 2018-05-05 MED ORDER — SODIUM CHLORIDE 0.9 % IV SOLN: INTRAVENOUS | Status: AC

## 2018-05-05 MED ORDER — SODIUM CHLORIDE 0.9% FLUSH: 3.0000 mL | INTRAVENOUS | Status: DC | PRN

## 2018-05-05 MED ORDER — ENOXAPARIN SODIUM 40 MG/0.4ML ~~LOC~~ SOLN
40.0000 mg | SUBCUTANEOUS | Status: DC
  Administered 2018-05-06 – 2018-05-11 (×6): 40 mg via SUBCUTANEOUS
  Filled 2018-05-05 (×6): qty 0.4

## 2018-05-05 MED ORDER — HEPARIN SODIUM (PORCINE) 1000 UNIT/ML IJ SOLN
INTRAMUSCULAR | Status: DC | PRN
  Administered 2018-05-05: 4000 [IU] via INTRAVENOUS

## 2018-05-05 MED ORDER — LIDOCAINE HCL (PF) 1 % IJ SOLN
INTRAMUSCULAR | Status: AC
  Filled 2018-05-05: qty 30

## 2018-05-05 MED ORDER — HEPARIN (PORCINE) IN NACL 1000-0.9 UT/500ML-% IV SOLN
INTRAVENOUS | Status: DC | PRN
  Administered 2018-05-05 (×2): 500 mL

## 2018-05-05 MED ORDER — MIDAZOLAM HCL 2 MG/2ML IJ SOLN
INTRAMUSCULAR | Status: DC | PRN
  Administered 2018-05-05: 1 mg via INTRAVENOUS
  Administered 2018-05-05: 0.5 mg via INTRAVENOUS

## 2018-05-05 MED ORDER — ACETAMINOPHEN 325 MG PO TABS: 650.0000 mg | ORAL_TABLET | ORAL | Status: DC | PRN

## 2018-05-05 MED ORDER — HEPARIN (PORCINE) IN NACL 1000-0.9 UT/500ML-% IV SOLN
INTRAVENOUS | Status: AC
  Filled 2018-05-05: qty 1000

## 2018-05-05 MED ORDER — LIDOCAINE HCL (PF) 1 % IJ SOLN
INTRAMUSCULAR | Status: DC | PRN
  Administered 2018-05-05 (×2): 2 mL

## 2018-05-05 MED ORDER — VERAPAMIL HCL 2.5 MG/ML IV SOLN
INTRAVENOUS | Status: AC
  Filled 2018-05-05: qty 2

## 2018-05-05 MED ORDER — SODIUM CHLORIDE 0.9 % IV SOLN: 250.0000 mL | INTRAVENOUS | Status: DC | PRN

## 2018-05-05 MED ORDER — SODIUM CHLORIDE 0.9 % IV SOLN
INTRAVENOUS | Status: AC | PRN
  Administered 2018-05-05: 100 mL/h via INTRAVENOUS

## 2018-05-05 MED ORDER — VERAPAMIL HCL 2.5 MG/ML IV SOLN
INTRAVENOUS | Status: DC | PRN
  Administered 2018-05-05: 10 mL via INTRA_ARTERIAL

## 2018-05-05 MED ORDER — FENTANYL CITRATE (PF) 100 MCG/2ML IJ SOLN
INTRAMUSCULAR | Status: DC | PRN
  Administered 2018-05-05 (×2): 25 ug via INTRAVENOUS

## 2018-05-05 MED ORDER — SODIUM CHLORIDE 0.9% FLUSH
3.0000 mL | Freq: Two times a day (BID) | INTRAVENOUS | Status: DC
  Administered 2018-05-05 – 2018-05-11 (×10): 3 mL via INTRAVENOUS

## 2018-05-05 MED ORDER — FENTANYL CITRATE (PF) 100 MCG/2ML IJ SOLN
INTRAMUSCULAR | Status: AC
  Filled 2018-05-05: qty 2

## 2018-05-05 MED ORDER — SODIUM CHLORIDE 0.9 % IV SOLN: INTRAVENOUS | Status: DC

## 2018-05-05 MED ORDER — MIDAZOLAM HCL 2 MG/2ML IJ SOLN
INTRAMUSCULAR | Status: AC
  Filled 2018-05-05: qty 2

## 2018-05-05 SURGICAL SUPPLY — 12 items
CATH 5FR JL3.5 JR4 ANG PIG MP (CATHETERS) ×2 IMPLANT
CATH BALLN WEDGE 5F 110CM (CATHETERS) ×2 IMPLANT
DEVICE RAD COMP TR BAND LRG (VASCULAR PRODUCTS) ×2 IMPLANT
GLIDESHEATH SLEND A-KIT 6F 22G (SHEATH) ×2 IMPLANT
GUIDEWIRE INQWIRE 1.5J.035X260 (WIRE) ×1 IMPLANT
INQWIRE 1.5J .035X260CM (WIRE) ×2
KIT HEART LEFT (KITS) ×2 IMPLANT
PACK CARDIAC CATHETERIZATION (CUSTOM PROCEDURE TRAY) ×2 IMPLANT
SHEATH GLIDE SLENDER 4/5FR (SHEATH) ×2 IMPLANT
SHEATH PROBE COVER 6X72 (BAG) ×2 IMPLANT
TRANSDUCER W/STOPCOCK (MISCELLANEOUS) ×2 IMPLANT
TUBING CIL FLEX 10 FLL-RA (TUBING) ×2 IMPLANT

## 2018-05-05 NOTE — H&P (View-Only) (Signed)
Progress Note  Patient Name: Gloria [name] Date of Encounter: 05/05/2018  Primary Cardiologist: Skeet Latch, MD   Subjective   No complaints this morning. Patient has no complaints this morning. Chest pain has resolved. She denies any shortness of breath. She is scheduled for a right/left cardiac catheterization later today.  Inpatient Medications    Scheduled Meds: . aspirin  81 mg Oral Pre-Cath  . enoxaparin (LOVENOX) injection  40 mg Subcutaneous Q24H  . feeding supplement (GLUCERNA SHAKE)  237 mL Oral TID BM  . furosemide  40 mg Intravenous Daily  . gabapentin  300 mg Oral BID  . lactulose  30 g Oral Daily  . losartan  25 mg Oral Daily  . multivitamin with minerals  1 tablet Oral Daily  . potassium chloride  40 mEq Oral Daily  . sodium chloride flush  3 mL Intravenous Q12H   Continuous Infusions: . sodium chloride    . sodium chloride     PRN Meds: sodium chloride, diphenhydrAMINE, iopamidol, labetalol, ondansetron (ZOFRAN) IV, oxyCODONE, sodium chloride flush, triamcinolone cream   Vital Signs    Vitals:   05/04/18 1932 05/05/18 0356 05/05/18 0403 05/05/18 1009  BP: 114/74  133/86 128/76  Pulse: 90  93   Resp: 18  18   Temp: 98.8 F (37.1 C)  98.3 F (36.8 C)   TempSrc: Oral  Oral   SpO2: 97%  94%   Weight:  88.5 kg    Height:        Intake/Output Summary (Last 24 hours) at 05/05/2018 1047 Last data filed at 05/05/2018 0357 Gross per 24 hour  Intake 360 ml  Output 2600 ml  Net -2240 ml   Filed Weights   05/03/18 0501 05/04/18 0611 05/05/18 0356  Weight: 91.8 kg 90.7 kg 88.5 kg    Telemetry    Sinus rhythm - Personally Reviewed  Physical Exam   GEN: 67 year old African-American female resting comfortably. Alert and in no acute distress.   Neck: Supple. Cardiac: RRR. No murmurs, rubs, or gallops.  Respiratory: No increased work of breathing. Normal respiratory rate. Clear to auscultation bilaterally. GI: Abdomen distended but soft and  non-tender to palpation.  MS: No significant lower extremity edema. Neuro:  No focal deficits. Psych: Normal affect.  Labs    Chemistry Recent Labs  Lab 05/01/18 1249 05/02/18 0613 05/03/18 0527 05/05/18 0455  NA 136 137 136 136  K 2.9* 3.0* 3.5 3.9  CL 103 108 104 102  CO2 26 22 25 26   GLUCOSE 137* 141* 122* 94  BUN <5* <5* <5* <5*  CREATININE 0.89 0.88 0.95 0.86  CALCIUM 8.9 8.3* 8.4* 8.7*  PROT 8.1  --   --   --   ALBUMIN 3.0*  --   --   --   AST 22  --   --   --   ALT 9  --   --   --   ALKPHOS 99  --   --   --   BILITOT 2.1*  --   --   --   GFRNONAA >60 >60 >60 >60  GFRAA >60 >60 >60 >60  ANIONGAP 7 7 7 8      Hematology Recent Labs  Lab 05/02/18 0613 05/03/18 0527 05/05/18 0455  WBC 7.3 7.3 8.1  RBC 3.38* 3.57* 3.66*  HGB 9.7* 9.9* 10.2*  HCT 30.3* 32.3* 33.3*  MCV 89.6 90.5 91.0  MCH 28.7 27.7 27.9  MCHC 32.0 30.7 30.6  RDW  14.6 14.6 14.7  PLT 233 258 279    Cardiac EnzymesNo results for input(s): TROPONINI in the last 168 hours.  Recent Labs  Lab 05/01/18 1326  TROPIPOC 0.00     BNP Recent Labs  Lab 05/01/18 1247 05/01/18 2206 05/02/18 0614  BNP 1,514.4* 1,504.5* 1,255.5*     DDimer No results for input(s): DDIMER in the last 168 hours.   Radiology    No results found.  Cardiac Studies   Echocardiogram 05/02/2018: Study Conclusions: - Left ventricle: The cavity size was mildly dilated. Wall thickness was increased in a pattern of mild LVH. Systolic function was severely reduced. The estimated ejection fraction was in the range of 20% to 25%. Diffuse hypokinesis. Doppler parameters are consistent with restrictive left ventricular relaxation (grade 3 diastolic dysfunction). The E/e&' ratio is >15, suggesting elevated LV filling pressure. - Mitral valve: Mildly thickened leaflets . There was moderate regurgitation. - Left atrium: The atrium was mildly dilated. - Right ventricle: The cavity size was mildly dilated.  Mildly reduced systolic dysfunction. - Tricuspid valve: There was moderate regurgitation. - Pulmonic valve: There was mild regurgitation. - Pulmonary arteries: PA peak pressure: 44 mm Hg (S). - Inferior vena cava: The vessel was dilated. The respirophasic diameter changes were blunted (<50%), consistent with elevated central venous pressure. - Pericardium, extracardiac: A trivial pericardial effusion was identified.  Impressions: - Compared to a prior study in 2017, the LVEF is severely reduced to 20-25%. _______________  Myoview 05/17/2016:  The left ventricular ejection fraction is mildly decreased (45-54%).  Nuclear stress EF: 45%.  Defect 1: There is a medium defect of mild severity present in the mid anterior, mid anteroseptal and apical anterior location. This appears most consistent with shifting breast artifact.  This is a low risk study.  _______________  Echocardiogram 03/21/2016: Study Conclusions: - Left ventricle: Global longitudinal strain is -14.3% The cavity   size was mildly dilated. Wall thickness was normal. Systolic   function was normal. The estimated ejection fraction was in the   range of 50%. Features are consistent with a pseudonormal left   ventricular filling pattern, with concomitant abnormal relaxation   and increased filling pressure (grade 2 diastolic dysfunction). - Mitral valve: There was mild regurgitation. - Left atrium: The atrium was mildly dilated.  Impressions: - Would recomm limited echo with Definity use to further evaluate   regional wall motion.  Patient Profile     67 y.o. female w/ hx mildHFrEF, HLD, HTN, DM, depression, who was admitted on 05/01/2018 [email or web address] is being seen today for the evaluation of newly reduced EF, acute systolic congestive heart failure. She underwent paracentesis with removal of 6 L of fluid.   Assessment & Plan    1. Acute on Chronic Combined Systolic and Diastolic Congestive  Heart Failure - Echo showed LVEF of 20-25% (down from 50% in 03/2016) with diffuse hypokinesis and grade 3 diastolic dysfunction.  - Myoview in 2017 was low risk.  - Patient is scheduled for right/left heart catheterization today. - Patient did receive IV dose of Lasix this morning but normal renal function with SCr of 0.86 today. - Patient denies any angina or dyspnea today. - Will continue gentle diuresis with Lasix. May be able to transition from IV to PO.  - Started on Losartan 25mg  daily on 11/2.  - Recommended addition of beta blocker as BP allows. BP has been soft at times during this admission but most recent BP 128/76.  - May also consider transitioning form Losartan  to [facility] if BP allows.  - Continue monitoring daily weights and strict I's and O's.  - Continue to monitor renal function closely during diuresis.     Otherwise, per Internal Medicine for: Hyperlipidemia Essential hypertension Uncontrolled type 2 diabetes mellitus with diabetic neuropathy, without long-term current use of insulin ([place]) Morbid obesity (HCC) OSA (obstructive sleep apnea) Anasarca Cirrhosis of liver with ascites ([place])  For questions or updates, please contact [facility] Please consult [email or web address] for contact info under        Signed, Darreld Mclean, PA-C  05/05/2018, 10:47 AM    I have examined the patient and reviewed assessment and plan and discussed with patient.  Agree with above as stated.  Plan for cath today.  Needs right and left given new decreased LVEF.    Cardiac catheterization was discussed with the patient fully. The patient understands that risks include but are not limited to stroke (1 in 1000), death (1 in 6), kidney failure [usually temporary] (1 in 500), bleeding (1 in 200), allergic reaction [possibly serious] (1 in 200).  The patient understands and is willing to proceed.    She is agreeable to the procedure.  Larae Grooms

## 2018-05-05 NOTE — Progress Notes (Signed)
TR band removed.

## 2018-05-05 NOTE — Progress Notes (Addendum)
Progress Note  Patient Name: Gloria [name] Date of Encounter: 05/05/2018  Primary Cardiologist: Skeet Latch, MD   Subjective   No complaints this morning. Patient has no complaints this morning. Chest pain has resolved. She denies any shortness of breath. She is scheduled for a right/left cardiac catheterization later today.  Inpatient Medications    Scheduled Meds: . aspirin  81 mg Oral Pre-Cath  . enoxaparin (LOVENOX) injection  40 mg Subcutaneous Q24H  . feeding supplement (GLUCERNA SHAKE)  237 mL Oral TID BM  . furosemide  40 mg Intravenous Daily  . gabapentin  300 mg Oral BID  . lactulose  30 g Oral Daily  . losartan  25 mg Oral Daily  . multivitamin with minerals  1 tablet Oral Daily  . potassium chloride  40 mEq Oral Daily  . sodium chloride flush  3 mL Intravenous Q12H   Continuous Infusions: . sodium chloride    . sodium chloride     PRN Meds: sodium chloride, diphenhydrAMINE, iopamidol, labetalol, ondansetron (ZOFRAN) IV, oxyCODONE, sodium chloride flush, triamcinolone cream   Vital Signs    Vitals:   05/04/18 1932 05/05/18 0356 05/05/18 0403 05/05/18 1009  BP: 114/74  133/86 128/76  Pulse: 90  93   Resp: 18  18   Temp: 98.8 F (37.1 C)  98.3 F (36.8 C)   TempSrc: Oral  Oral   SpO2: 97%  94%   Weight:  88.5 kg    Height:        Intake/Output Summary (Last 24 hours) at 05/05/2018 1047 Last data filed at 05/05/2018 0357 Gross per 24 hour  Intake 360 ml  Output 2600 ml  Net -2240 ml   Filed Weights   05/03/18 0501 05/04/18 0611 05/05/18 0356  Weight: 91.8 kg 90.7 kg 88.5 kg    Telemetry    Sinus rhythm - Personally Reviewed  Physical Exam   GEN: 67 year old African-American female resting comfortably. Alert and in no acute distress.   Neck: Supple. Cardiac: RRR. No murmurs, rubs, or gallops.  Respiratory: No increased work of breathing. Normal respiratory rate. Clear to auscultation bilaterally. GI: Abdomen distended but soft and  non-tender to palpation.  MS: No significant lower extremity edema. Neuro:  No focal deficits. Psych: Normal affect.  Labs    Chemistry Recent Labs  Lab 05/01/18 1249 05/02/18 0613 05/03/18 0527 05/05/18 0455  NA 136 137 136 136  K 2.9* 3.0* 3.5 3.9  CL 103 108 104 102  CO2 26 22 25 26   GLUCOSE 137* 141* 122* 94  BUN <5* <5* <5* <5*  CREATININE 0.89 0.88 0.95 0.86  CALCIUM 8.9 8.3* 8.4* 8.7*  PROT 8.1  --   --   --   ALBUMIN 3.0*  --   --   --   AST 22  --   --   --   ALT 9  --   --   --   ALKPHOS 99  --   --   --   BILITOT 2.1*  --   --   --   GFRNONAA >60 >60 >60 >60  GFRAA >60 >60 >60 >60  ANIONGAP 7 7 7 8      Hematology Recent Labs  Lab 05/02/18 0613 05/03/18 0527 05/05/18 0455  WBC 7.3 7.3 8.1  RBC 3.38* 3.57* 3.66*  HGB 9.7* 9.9* 10.2*  HCT 30.3* 32.3* 33.3*  MCV 89.6 90.5 91.0  MCH 28.7 27.7 27.9  MCHC 32.0 30.7 30.6  RDW  14.6 14.6 14.7  PLT 233 258 279    Cardiac EnzymesNo results for input(s): TROPONINI in the last 168 hours.  Recent Labs  Lab 05/01/18 1326  TROPIPOC 0.00     BNP Recent Labs  Lab 05/01/18 1247 05/01/18 2206 05/02/18 0614  BNP 1,514.4* 1,504.5* 1,255.5*     DDimer No results for input(s): DDIMER in the last 168 hours.   Radiology    No results found.  Cardiac Studies   Echocardiogram 05/02/2018: Study Conclusions: - Left ventricle: The cavity size was mildly dilated. Wall thickness was increased in a pattern of mild LVH. Systolic function was severely reduced. The estimated ejection fraction was in the range of 20% to 25%. Diffuse hypokinesis. Doppler parameters are consistent with restrictive left ventricular relaxation (grade 3 diastolic dysfunction). The E/e&' ratio is >15, suggesting elevated LV filling pressure. - Mitral valve: Mildly thickened leaflets . There was moderate regurgitation. - Left atrium: The atrium was mildly dilated. - Right ventricle: The cavity size was mildly dilated.  Mildly reduced systolic dysfunction. - Tricuspid valve: There was moderate regurgitation. - Pulmonic valve: There was mild regurgitation. - Pulmonary arteries: PA peak pressure: 44 mm Hg (S). - Inferior vena cava: The vessel was dilated. The respirophasic diameter changes were blunted (<50%), consistent with elevated central venous pressure. - Pericardium, extracardiac: A trivial pericardial effusion was identified.  Impressions: - Compared to a prior study in 2017, the LVEF is severely reduced to 20-25%. _______________  Myoview 05/17/2016:  The left ventricular ejection fraction is mildly decreased (45-54%).  Nuclear stress EF: 45%.  Defect 1: There is a medium defect of mild severity present in the mid anterior, mid anteroseptal and apical anterior location. This appears most consistent with shifting breast artifact.  This is a low risk study.  _______________  Echocardiogram 03/21/2016: Study Conclusions: - Left ventricle: Global longitudinal strain is -14.3% The cavity   size was mildly dilated. Wall thickness was normal. Systolic   function was normal. The estimated ejection fraction was in the   range of 50%. Features are consistent with a pseudonormal left   ventricular filling pattern, with concomitant abnormal relaxation   and increased filling pressure (grade 2 diastolic dysfunction). - Mitral valve: There was mild regurgitation. - Left atrium: The atrium was mildly dilated.  Impressions: - Would recomm limited echo with Definity use to further evaluate   regional wall motion.  Patient Profile     67 y.o. female w/ hx mildHFrEF, HLD, HTN, DM, depression, who was admitted on 05/01/2018 [email or web address] is being seen today for the evaluation of newly reduced EF, acute systolic congestive heart failure. She underwent paracentesis with removal of 6 L of fluid.   Assessment & Plan    1. Acute on Chronic Combined Systolic and Diastolic Congestive  Heart Failure - Echo showed LVEF of 20-25% (down from 50% in 03/2016) with diffuse hypokinesis and grade 3 diastolic dysfunction.  - Myoview in 2017 was low risk.  - Patient is scheduled for right/left heart catheterization today. - Patient did receive IV dose of Lasix this morning but normal renal function with SCr of 0.86 today. - Patient denies any angina or dyspnea today. - Will continue gentle diuresis with Lasix. May be able to transition from IV to PO.  - Started on Losartan 25mg  daily on 11/2.  - Recommended addition of beta blocker as BP allows. BP has been soft at times during this admission but most recent BP 128/76.  - May also consider transitioning form Losartan  to [facility] if BP allows.  - Continue monitoring daily weights and strict I's and O's.  - Continue to monitor renal function closely during diuresis.     Otherwise, per Internal Medicine for: Hyperlipidemia Essential hypertension Uncontrolled type 2 diabetes mellitus with diabetic neuropathy, without long-term current use of insulin ([place]) Morbid obesity (HCC) OSA (obstructive sleep apnea) Anasarca Cirrhosis of liver with ascites ([place])  For questions or updates, please contact [facility] Please consult [email or web address] for contact info under        Signed, Darreld Mclean, PA-C  05/05/2018, 10:47 AM    I have examined the patient and reviewed assessment and plan and discussed with patient.  Agree with above as stated.  Plan for cath today.  Needs right and left given new decreased LVEF.    Cardiac catheterization was discussed with the patient fully. The patient understands that risks include but are not limited to stroke (1 in 1000), death (1 in 65), kidney failure [usually temporary] (1 in 500), bleeding (1 in 200), allergic reaction [possibly serious] (1 in 200).  The patient understands and is willing to proceed.    She is agreeable to the procedure.  Larae Grooms

## 2018-05-05 NOTE — Plan of Care (Signed)
  Problem: Education: Goal: Knowledge of General Education information will improve Description: Including pain rating scale, medication(s)/side effects and non-pharmacologic comfort measures Outcome: Progressing   Problem: Health Behavior/Discharge Planning: Goal: Ability to manage health-related needs will improve Outcome: Progressing   Problem: Clinical Measurements: Goal: Cardiovascular complication will be avoided Outcome: Progressing   Problem: Coping: Goal: Level of anxiety will decrease Outcome: Progressing   

## 2018-05-05 NOTE — Progress Notes (Signed)
PROGRESS NOTE  OLIVINE HIERS WCB:[id number] DOB: [birth year] DOA: 05/01/2018 PCP: Biagio Borg, MD  HPI/Recap of past 24 hours: Gloria [name] is a 67 y.o. female with medical history significant for chronic diastolic CHF, hypertension, asthma not treated, OSA noncompliant with CPAP, type 2 diabetes, polyneuropathy, hyperlipidemia, who presented to ED [facility] brought in by her sister and brother-in-law due to worsening shortness of breath at rest.  Patient is alert and oriented x3.  Reports that in the last 2 months she has been having progressive worsening shortness of breath.  Associated with increasing abdominal girth and weight loss more than 20 pounds in the last 2 months.  Did not seek help for this but states that she has a PCP that she sees once a year.  Denies any chest pain or palpitations.  Denies melena, hematochezia or hematemesis.  Denies fever, chills, nausea, abdominal pain, diarrhea or constipation.  Had paracentesis by IR on 05/01/18 with 6L of fluid removed.  Abdomen ultrasound revealed cirrhosis of the liver.  Negative hepatitis panel.  Hospital course complicated by newly diagnosed systolic CHF and intermittent chest pain. L and R HC today.  05/05/18: Admits to intermittent chest pain this am. No other complaints. Heart cath planned today. Cardiology following.  Assessment/Plan: Principal Problem:   Acute systolic (congestive) heart failure (HCC) Active Problems:   Hyperlipidemia   Essential hypertension   Uncontrolled type 2 diabetes mellitus with diabetic neuropathy, without long-term current use of insulin (HCC)   Morbid obesity (HCC)   OSA (obstructive sleep apnea)   Anasarca   Cirrhosis of liver with ascites (HCC)  Acute on chronic combined systolic and diastolic CHF 2D echo done on 03/21/2016 revealed preserved LVEF 50 to 55% with grade 1 diastolic dysfunction Repeated 2D echo done on 61/11/735 revealed systolic CHF with LVEF 20 to 25% and grade 3 diastolic  dysfunction. Cardiology consulted to further assess and to manage newly diagnosed systolic CHF. Currently on Lasix IV 40 mg daily and losartan 25 mg daily Right and left heart cath today.  Cardiology following  Newly diagnosed systolic CHF The etiology of her worsening heart failure is unclear Cardiology following Medications as stated above  Newly diagnosed cirrhosis of the liver with ascites Findings per abdomen ultrasound, cirrhosis of the liver Negative hepatitis panel and negative HIV screening 6 L of fluid removed by interventional radiologist on 05/01/2018 On lactulose and Lasix Will need follow-up with GI  Hyperammonemia Continue lactulose 30 g daily  Resolved hypokalemia post repletion  Chronic normocytic anemia Drop in hemoglobin from 11 to 9 possibly dilutional from fluid overload No sign of overt bleeding  Generalized weakness/physical debility/ambulatory dysfunction PT assessed and recommended home health PT Fall precautions Continue oral supplement   Code Status: Full code  Family Communication: Spoke with her son on the phone.  Updated him on her current medical status.  Disposition Plan: Home when hemodynamically stable or when cardiology signs off.   Consultants:  Radiology  Procedures:  Paracentesis by interventional radiology  Antimicrobials:  None  DVT prophylaxis: Subcu Lovenox daily   Objective: Vitals:   05/05/18 1546 05/05/18 1600 05/05/18 1630 05/05/18 1701  BP: 113/72 116/74 135/81 110/73  Pulse:  83 98 89  Resp:      Temp: 97.8 F (36.6 C)     TempSrc: Oral     SpO2:  95%    Weight:      Height:        Intake/Output Summary (Last 24 hours) at  05/05/2018 1759 Last data filed at 05/05/2018 1757 Gross per 24 hour  Intake 608.82 ml  Output 2600 ml  Net -1991.18 ml   Filed Weights   05/03/18 0501 05/04/18 0611 05/05/18 0356  Weight: 91.8 kg 90.7 kg 88.5 kg    Exam:  . General: 67 y.o. year-old female WD WN NAD A&O x  3 . Cardiovascular: RRR no rubs or gallops. No JVD or thyromegaly . Respiratory: Clear to auscultation with no wheezes or rales.  Good inspiratory effort. . Abdomen: Soft nontender moderately distended with normal bowel sounds x4 quadrants. . Musculoskeletal: Improved pitting edema in lower extremities bilaterally.  2 out of 4 pulses in all 4 extremities. Marland Kitchen Psychiatry: Mood is appropriate for condition and setting   Data Reviewed: CBC: Recent Labs  Lab 05/01/18 1249 05/02/18 0613 05/03/18 0527 05/05/18 0455  WBC 8.0 7.3 7.3 8.1  NEUTROABS  --  6.0 6.0  --   HGB 11.2* 9.7* 9.9* 10.2*  HCT 35.5* 30.3* 32.3* 33.3*  MCV 90.6 89.6 90.5 91.0  PLT 282 233 258 109   Basic Metabolic Panel: Recent Labs  Lab 05/01/18 1249 05/02/18 0613 05/03/18 0527 05/05/18 0455  NA 136 137 136 136  K 2.9* 3.0* 3.5 3.9  CL 103 108 104 102  CO2 26 22 25 26   GLUCOSE 137* 141* 122* 94  BUN <5* <5* <5* <5*  CREATININE 0.89 0.88 0.95 0.86  CALCIUM 8.9 8.3* 8.4* 8.7*  MG  --  1.6* 1.9  --   PHOS  --   --  3.2  --    GFR: Estimated Creatinine Clearance: 71.1 mL/min (by C-G formula based on SCr of 0.86 mg/dL). Liver Function Tests: Recent Labs  Lab 05/01/18 1249  AST 22  ALT 9  ALKPHOS 99  BILITOT 2.1*  PROT 8.1  ALBUMIN 3.0*   Recent Labs  Lab 05/01/18 1249  LIPASE 26   Recent Labs  Lab 05/01/18 2206  AMMONIA 36*   Coagulation Profile: Recent Labs  Lab 05/01/18 1253  INR 1.43   Cardiac Enzymes: No results for input(s): CKTOTAL, CKMB, CKMBINDEX, TROPONINI in the last 168 hours. BNP (last 3 results) No results for input(s): PROBNP in the last 8760 hours. HbA1C: No results for input(s): HGBA1C in the last 72 hours. CBG: Recent Labs  Lab 05/01/18 2015 05/01/18 2317 05/02/18 0540 05/03/18 0741 05/03/18 1231  GLUCAP 130* 103* 126* 115* 97   Lipid Profile: No results for input(s): CHOL, HDL, LDLCALC, TRIG, CHOLHDL, LDLDIRECT in the last 72 hours. Thyroid Function  Tests: No results for input(s): TSH, T4TOTAL, FREET4, T3FREE, THYROIDAB in the last 72 hours. Anemia Panel: No results for input(s): VITAMINB12, FOLATE, FERRITIN, TIBC, IRON, RETICCTPCT in the last 72 hours. Urine analysis:    Component Value Date/Time   COLORURINE YELLOW 05/01/2018 1249   APPEARANCEUR CLEAR 05/01/2018 1249   LABSPEC 1.009 05/01/2018 1249   PHURINE 7.0 05/01/2018 1249   GLUCOSEU NEGATIVE 05/01/2018 1249   GLUCOSEU NEGATIVE 02/25/2018 1500   HGBUR NEGATIVE 05/01/2018 1249   BILIRUBINUR NEGATIVE 05/01/2018 1249   BILIRUBINUR negatvie 11/04/2016 1312   KETONESUR NEGATIVE 05/01/2018 1249   PROTEINUR 30 (A) 05/01/2018 1249   UROBILINOGEN 0.2 02/25/2018 1500   NITRITE NEGATIVE 05/01/2018 1249   LEUKOCYTESUR NEGATIVE 05/01/2018 1249   Sepsis Labs: @LABRCNTIP (procalcitonin:4,lacticidven:4)  ) Recent Results (from the past 240 hour(s))  Culture, body fluid-bottle     Status: None (Preliminary result)   Collection Time: 05/01/18  5:23 PM  Result Value Ref Range  Status   Specimen Description PERITONEAL  Final   Special Requests NONE  Final   Culture   Final    NO GROWTH 4 DAYS Performed at [facility] [address], [place], [place] [address]    Report Status PENDING  Incomplete  Gram stain     Status: None   Collection Time: 05/01/18  5:23 PM  Result Value Ref Range Status   Specimen Description PERITONEAL  Final   Special Requests NONE  Final   Gram Stain   Final    RARE WBC PRESENT, PREDOMINANTLY PMN NO ORGANISMS SEEN Performed at [facility], [place] [address] [place], [place] [address]    Report Status 05/01/2018 FINAL  Final      Studies: No results found.  Scheduled Meds: . [START ON 05/06/2018] enoxaparin (LOVENOX) injection  40 mg Subcutaneous Q24H  . feeding supplement (GLUCERNA SHAKE)  237 mL Oral TID BM  . furosemide  40 mg Intravenous Daily  . gabapentin  300 mg Oral BID  . lactulose  30 g Oral Daily  . losartan  25 mg Oral  Daily  . multivitamin with minerals  1 tablet Oral Daily  . potassium chloride  40 mEq Oral Daily  . sodium chloride flush  3 mL Intravenous Q12H    Continuous Infusions: . sodium chloride 50 mL/hr at 05/05/18 1533  . sodium chloride       LOS: 4 days     Kayleen Memos, MD [facility] Pager [phone]  If 7PM-7AM, please contact night-coverage [email or web address] Password [email or web address] 05/05/2018, 5:59 PM

## 2018-05-05 NOTE — Research (Signed)
PHDEV Informed Consent   Subject Name: Gloria [name]  Subject met inclusion and exclusion criteria.  The informed consent form, study requirements and expectations were reviewed with the subject and questions and concerns were addressed prior to the signing of the consent form.  The subject verbalized understanding of the trail requirements.  The subject agreed to participate in the [facility] trial and signed the informed consent.  The informed consent was obtained prior to performance of any protocol-specific procedures for the subject.  A copy of the signed informed consent was given to the subject and a copy was placed in the subject's medical record.  [place], [name] 05/05/2018, 0982

## 2018-05-05 NOTE — Progress Notes (Signed)
PT Cancellation Note  Patient Details Name: AMARACHI KOTZ MRN: [id number] DOB: 27-Aug-1950   Cancelled Treatment:    Reason Eval/Treat Not Completed: Other (comment). Refusing OOB mobility despite max encouragement/education on importance. Will continue efforts as pt agreeable to participate.   Mabeline Caras, PT, DPT Acute Rehabilitation Services  Pager [phone] Office [place] 05/05/2018, 11:15 AM

## 2018-05-05 NOTE — Progress Notes (Signed)
Patient going down to cath lab.

## 2018-05-05 NOTE — Progress Notes (Signed)
Nutrition Follow-up  DOCUMENTATION CODES:   Obesity unspecified  INTERVENTION:   -Once diet is advanced, resume:  -Glucerna Shake po TID, each supplement provides 220 kcal and 10 grams of protein -MVI with minerals daily  NUTRITION DIAGNOSIS:   Inadequate oral intake related to acute illness as evidenced by per patient/family report.  Progressing  GOAL:   Patient will meet greater than or equal to 90% of their needs  Progressing  MONITOR:   PO intake, Supplement acceptance, Labs, Weight trends, Skin, I & O's  REASON FOR ASSESSMENT:   Malnutrition Screening Tool    ASSESSMENT:   67 y.o. female with medical history significant for chronic diastolic CHF, hypertension, asthma not treated, OSA noncompliant with CPAP, type 2 diabetes, polyneuropathy, hyperlipidemia, who presented to ED [facility] brought in by her sister and brother-in-law due to worsening shortness of breath, BLE swelling and ascites   11/1- s/p paracentesis with 6L fluid removal   Attempted to speak with pt x 3, however, pt either resting quietly or receiving nursing care at times of visits.   Pt is currently NPO for right/left cardiac catheterization later today.  Noted meal completion 75-100% prior to NPO status. Some refusals of Glucerna supplements related to vomiting on 05/03/18.   Labs reviewed: CBGS: 97-115.   Diet Order:   Diet Order            Diet NPO time specified  Diet effective now              EDUCATION NEEDS:   Education needs have been addressed  Skin:  Skin Assessment: Reviewed RN Assessment  Last BM:  05/04/18  Height:   Ht Readings from Last 1 Encounters:  05/01/18 5\' 6"  (1.676 m)    Weight:   Wt Readings from Last 1 Encounters:  05/05/18 88.5 kg    Ideal Body Weight:  59 kg  BMI:  Body mass index is 31.49 kg/m.  Estimated Nutritional Needs:   Kcal:  5631-4970  Protein:  90-105 grams  Fluid:  1.7-1.9 L    Rane Dumm A. Jimmye Norman, RD, LDN, CDE Pager:  [phone] After hours Pager: [phone]

## 2018-05-05 NOTE — Interval H&P Note (Signed)
Cath Lab Visit (complete for each Cath Lab visit)  Clinical Evaluation Leading to the Procedure:   ACS: No.  Non-ACS:    Anginal Classification: CCS III  Anti-ischemic medical therapy: Minimal Therapy (1 class of medications)  Non-Invasive Test Results: No non-invasive testing performed  Prior CABG: No previous CABG      History and Physical Interval Note:  05/05/2018 1:16 PM  Reuel Derby  has presented today for surgery, with the diagnosis of cp  The various methods of treatment have been discussed with the patient and family. After consideration of risks, benefits and other options for treatment, the patient has consented to  Procedure(s): RIGHT/LEFT HEART CATH AND CORONARY ANGIOGRAPHY (N/A) as a surgical intervention .  The patient's history has been reviewed, patient examined, no change in status, stable for surgery.  I have reviewed the patient's chart and labs.  Questions were answered to the patient's satisfaction.     Belva Crome III

## 2018-05-06 ENCOUNTER — Inpatient Hospital Stay (HOSPITAL_COMMUNITY): Payer: Medicare Other

## 2018-05-06 ENCOUNTER — Encounter (HOSPITAL_COMMUNITY): Payer: Self-pay | Admitting: Interventional Cardiology

## 2018-05-06 DIAGNOSIS — I5043 Acute on chronic combined systolic (congestive) and diastolic (congestive) heart failure: Secondary | ICD-10-CM

## 2018-05-06 LAB — BASIC METABOLIC PANEL
Anion gap: 3 — ABNORMAL LOW (ref 5–15)
BUN: <5 mg/dL — ABNORMAL LOW (ref 8–23)
CO2: 31 mmol/L (ref 22–32)
Calcium: 8.5 mg/dL — ABNORMAL LOW (ref 8.9–10.3)
Chloride: 103 mmol/L (ref 98–111)
Creatinine, Ser: 0.82 mg/dL (ref 0.44–1.00)
GFR calc Af Amer: >60 mL/min (ref >60)
GFR calc non Af Amer: >60 mL/min (ref >60)
Glucose, Bld: 71 mg/dL (ref 70–99)
Potassium: 4.2 mmol/L (ref 3.5–5.1)
Sodium: 137 mmol/L (ref 135–145)

## 2018-05-06 LAB — CULTURE, BODY FLUID W GRAM STAIN -BOTTLE: Culture: NO GROWTH

## 2018-05-06 MED ORDER — CARVEDILOL 3.125 MG PO TABS
3.1250 mg | ORAL_TABLET | Freq: Two times a day (BID) | ORAL | Status: DC
  Administered 2018-05-06 – 2018-05-11 (×9): 3.125 mg via ORAL
  Filled 2018-05-06 (×10): qty 1

## 2018-05-06 MED ORDER — GADOBENATE DIMEGLUMINE 529 MG/ML IV SOLN
10.0000 mL | Freq: Once | INTRAVENOUS | Status: AC
  Administered 2018-05-06: 9 mL via INTRAVENOUS

## 2018-05-06 MED ORDER — FUROSEMIDE 10 MG/ML IJ SOLN
40.0000 mg | Freq: Once | INTRAMUSCULAR | Status: AC
  Administered 2018-05-06: 40 mg via INTRAVENOUS
  Filled 2018-05-06: qty 4

## 2018-05-06 MED ORDER — FUROSEMIDE 10 MG/ML IJ SOLN
80.0000 mg | Freq: Two times a day (BID) | INTRAMUSCULAR | Status: DC
  Administered 2018-05-06 – 2018-05-08 (×4): 80 mg via INTRAVENOUS
  Filled 2018-05-06 (×4): qty 8

## 2018-05-06 MED ORDER — SPIRONOLACTONE 12.5 MG HALF TABLET
12.5000 mg | ORAL_TABLET | Freq: Every day | ORAL | Status: DC
  Administered 2018-05-06 – 2018-05-07 (×2): 12.5 mg via ORAL
  Filled 2018-05-06 (×4): qty 1

## 2018-05-06 NOTE — Consult Note (Addendum)
Advanced Heart Failure Team Consult Note   Primary Physician: Biagio Borg, MD PCP-Cardiologist:  Skeet Latch, MD  Reason for Consultation: Acute systolic CHF  HPI:    Gloria [name] is seen today for evaluation of at the request of Dr. Nevada Crane.   Gloria [name] is a 67 y.o. female with h/o chronic diastolic CHF (borderline EF in 2017 with LVEF 45% on myoview), HLD, DM2, and depression.   Follows with Dr. Oval Linsey, but last seen in office 05/2017 and was doing well at that time.   Admitted 05/01/18 with two months of progressive dyspnea and increased abdominal girth. Pertinent labs on admission include BNP 1504, Cr 0.89, K 2.9, Hgb 11.2, WBC 8.0. CXR showed no acute abnormalities with poor respiratory effort. EKG showed sinus tach with nonspecific ST changes. RUQ US showed significant cirrhosis. Underwent abdominal paracentesis with 6L pulled off her her abdomen. CT angio chest negative for PE, but showed massive ascites and anasarca as above.   With drop in EF as below, taken for [facility] 05/05/18. This showed normal coronaries, mod pulmonary hypertension (most likely pulmonary venous hypertension in setting of volume overload), and equalization of end-diastolic pressures (within 4 mmHg) concerning for restrictive hemodynamic physiology.  HF team asked to see in consult.   Negative 2.9 L and weight down 5 lbs. (19 from admit) Cr stable at 0.82. K 4.2  Today she is feeling somewhat better. Not sure her baseline weight. She lives at home alone, but has a son who lives in [place]. She has felt bad over the past several months, with no clear inciting factor. Got to the point where she couldn't bathe or dress without SOB. She takes her medications and drives herself to appointments. She has not worn her CPAP in years, and has been years since last sleep study. Mask in uncomfortable. She keeps her swelling mostly in her belly. Denies dizziness or lightheadedness. No chest pain. No recent  illnesses.  Echo 05/2018 LVEF 20-25% (Down from 50-55 in 2017), Grade 3 DD, Moderate MR, Mild LAE, Mild RV dilation, mildly reduced RV function, Mod TR, Mild PI, Pa peak pressure 44 mm Hg.   [facility] 05/05/18 - Widely patent coronary arteries RA mean 23 RV 55/13 PA 56/30 PCWP 26 AO 121/78 Cardiac Output (Fick) 6.56 Cardiac Index (Fick) 3.31 PVR 2.0 Wood units PAPI 1.13  Review of systems complete and found to be negative unless listed in HPI.    Home Medications Prior to Admission medications   Medication Sig Start Date End Date Taking? Authorizing Provider  furosemide (LASIX) 20 MG tablet Take 1 tablet (20 mg total) by mouth daily. 02/25/18  Yes Biagio Borg, MD  magic mouthwash SOLN Take 5 mLs by mouth 3 (three) times daily as needed for mouth pain. 02/25/18  Yes Biagio Borg, MD  triamcinolone cream (KENALOG) 0.1 % Apply 1 application topically 2 (two) times daily. Patient taking differently: Apply 1 application topically 2 (two) times daily as needed (rash).  02/25/18 02/25/19 Yes Biagio Borg, MD  glucose blood (ACCU-CHEK AVIVA) test strip Use as instructed four times per day 08/26/17   Philemon Kingdom, MD  Insulin Glargine (LANTUS SOLOSTAR) 100 UNIT/ML Solostar Pen Inject 24 Units into the skin daily. Patient not taking: Reported on 05/01/2018 08/26/17   Philemon Kingdom, MD  Insulin Pen Needle (BD PEN NEEDLE NANO U/F) 32G X 4 MM MISC USE ONCE DAILY 08/26/17   Philemon Kingdom, MD  irbesartan (AVAPRO) 150 MG tablet Take  150 mg by mouth daily.    [provider]  irbesartan (AVAPRO) 300 MG tablet Take 1 tablet (300 mg total) by mouth daily. Patient not taking: Reported on 05/01/2018 11/06/16   Biagio Borg, MD  metFORMIN (GLUCOPHAGE-XR) 500 MG 24 hr tablet Take 1 tablet (500 mg total) by mouth 2 (two) times daily with a meal. Patient not taking: Reported on 05/01/2018 08/26/17   Philemon Kingdom, MD  potassium chloride (K-DUR,KLOR-CON) 10 MEQ tablet Take 10 mEq by mouth daily.     [provider]    Past Medical History: Past Medical History:  Diagnosis Date  . Allergic rhinitis, cause unspecified 03/30/2013  . Anxiety state, unspecified   . Arthritis    "knees" (05/01/2018)  . Asthma    hx (05/01/2018)  . CHF (congestive heart failure) ([employer])   . Chronic bronchitis   . Depressive disorder, not elsewhere classified   . Difficulty waking    hard to wake up past sedation!  . Migraine, unspecified, without mention of intractable migraine without mention of status migrainosus   . OSA (obstructive sleep apnea) 12/25/2016  . Other and unspecified hyperlipidemia   . Panic attacks   . Postmenopausal symptoms   . Type 2 diabetes, diet controlled ([place])   . Unspecified essential hypertension   . Unspecified urinary incontinence   . UTI (lower urinary tract infection)    on 02/05/16/on meds    Past Surgical History: Past Surgical History:  Procedure Laterality Date  . ABDOMINAL HYSTERECTOMY  2002  . DIAGNOSTIC LAPAROSCOPY    . OPEN REDUCTION SHOULDER DISLOCATION Right   . RIGHT/LEFT HEART CATH AND CORONARY ANGIOGRAPHY N/A 05/05/2018   Procedure: RIGHT/LEFT HEART CATH AND CORONARY ANGIOGRAPHY;  Surgeon: Belva Crome, MD;  Location: [facility];  Service: Cardiovascular;  Laterality: N/A;  . TUBAL LIGATION    . TUMOR EXCISION Right    "ankle"    Family History: Family History  Problem Relation Age of Onset  . Arthritis Unknown        family history  . Hypertension Unknown        grandparent  . Colon cancer Maternal Grandfather   . Asthma Son   . Rheum arthritis Mother   . Hypertension Mother   . Diabetes Maternal Uncle   . Diabetes Paternal Uncle   . Diabetes Maternal Grandmother     Social History: Social History   Socioeconomic History  . Marital status: Divorced    Spouse name: Not on file  . Number of children: 1  . Years of education: Not on file  . Highest education level: Not on file  Occupational History  .  Occupation: RETIRED ---Self employed-interior design/flower arrangements  Social Needs  . Financial resource strain: Not on file  . Food insecurity:    Worry: Not on file    Inability: Not on file  . Transportation needs:    Medical: Not on file    Non-medical: Not on file  Tobacco Use  . Smoking status: Never Smoker  . Smokeless tobacco: Never Used  Substance and Sexual Activity  . Alcohol use: Never    Alcohol/week: 0.0 standard drinks    Frequency: Never  . Drug use: Never  . Sexual activity: Not Currently  Lifestyle  . Physical activity:    Days per week: Not on file    Minutes per session: Not on file  . Stress: Not on file  Relationships  . Social connections:    Talks on  phone: Not on file    Gets together: Not on file    Attends religious service: Not on file    Active member of club or organization: Not on file    Attends meetings of clubs or organizations: Not on file    Relationship status: Not on file  Other Topics Concern  . Not on file  Social History Narrative   -Retired - does Company secretary   -Divorced in 2007   -One son - lives locally. 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 and entertain family. Bowling. Interior decorating       Allergies:  Allergies  Allergen Reactions  . Compazine Other (See Comments)    Stroke like symptoms  . Haloperidol Decanoate Other (See Comments)    Extrapyramidal syndrome  . Penicillins Nausea And Vomiting  . Glipizide Other (See Comments)    dyspnea  . Metformin And Related Other (See Comments)    GI upset  . Codeine Itching  . Lisinopril Cough    Objective:    Vital Signs:   Temp:  [97.8 F (36.6 C)-98.5 F (36.9 C)] 98.5 F (36.9 C) (11/06 0444) Pulse Rate:  [82-98] 89 (11/06 0900) Resp:  [11-25] 18 (11/06 0444) BP: (110-144)/(67-101) 115/67 (11/06 0900) SpO2:  [89 %-99 %] 97 % (11/06 0900) Weight:  [86.2 kg] 86.2 kg (11/06 0444) Last BM Date: 05/05/18  Weight  change: Filed Weights   05/04/18 0611 05/05/18 0356 05/06/18 0444  Weight: 90.7 kg 88.5 kg 86.2 kg    Intake/Output:   Intake/Output Summary (Last 24 hours) at 05/06/2018 0929 Last data filed at 05/06/2018 0554 Gross per 24 hour  Intake 854.99 ml  Output 3800 ml  Net -2945.01 ml      Physical Exam    General:  NAD  HEENT: Normal Neck: supple. JVP to jaw. Carotids 2+ bilat; no bruits. No lymphadenopathy or thyromegaly appreciated. Cor: PMI lateral. Regular rate & rhythm. No rubs, gallops or murmurs. Lungs: Diminished basilar sounds.  Abdomen: Obese, distended, non-tender. No hepatosplenomegaly appreciated. No bruits or masses. Good bowel sounds. Extremities: no cyanosis, clubbing, or rash. 1+ BLE edema.  Neuro: alert & orientedx3, cranial nerves grossly intact. moves all 4 extremities w/o difficulty. Affect flat but appropriate.   Telemetry   NSR 90s, personally reviewed.  EKG    NSR 91 bpm, QRS 80, personally reviewed.  Labs   Basic Metabolic Panel: Recent Labs  Lab 05/01/18 1249 05/02/18 0613 05/03/18 0527 05/05/18 0455 05/06/18 0433  NA 136 137 136 136 137  K 2.9* 3.0* 3.5 3.9 4.2  CL 103 108 104 102 103  CO2 26 22 25 26 31   GLUCOSE 137* 141* 122* 94 71  BUN <5* <5* <5* <5* <5*  CREATININE 0.89 0.88 0.95 0.86 0.82  CALCIUM 8.9 8.3* 8.4* 8.7* 8.5*  MG  --  1.6* 1.9  --   --   PHOS  --   --  3.2  --   --     Liver Function Tests: Recent Labs  Lab 05/01/18 1249  AST 22  ALT 9  ALKPHOS 99  BILITOT 2.1*  PROT 8.1  ALBUMIN 3.0*   Recent Labs  Lab 05/01/18 1249  LIPASE 26   Recent Labs  Lab 05/01/18 2206  AMMONIA 36*    CBC: Recent Labs  Lab 05/01/18 1249 05/02/18 0613 05/03/18 0527 05/05/18 0455  WBC 8.0 7.3 7.3 8.1  NEUTROABS  --  6.0 6.0  --   HGB 11.2* 9.7* 9.9* 10.2*  HCT 35.5* 30.3* 32.3* 33.3*  MCV 90.6 89.6 90.5 91.0  PLT 282 233 258 279    Cardiac Enzymes: No results for input(s): CKTOTAL, CKMB, CKMBINDEX, TROPONINI in  the last 168 hours.  BNP: BNP (last 3 results) Recent Labs    05/01/18 1247 05/01/18 2206 05/02/18 0614  BNP 1,514.4* 1,504.5* 1,255.5*    ProBNP (last 3 results) No results for input(s): PROBNP in the last 8760 hours.   CBG: Recent Labs  Lab 05/01/18 2015 05/01/18 2317 05/02/18 0540 05/03/18 0741 05/03/18 1231  GLUCAP 130* 103* 126* 115* 97    Coagulation Studies: No results for input(s): LABPROT, INR in the last 72 hours.   Imaging    No results found.   Medications:     Current Medications: . enoxaparin (LOVENOX) injection  40 mg Subcutaneous Q24H  . feeding supplement (GLUCERNA SHAKE)  237 mL Oral TID BM  . furosemide  40 mg Intravenous Daily  . gabapentin  300 mg Oral BID  . lactulose  30 g Oral Daily  . losartan  25 mg Oral Daily  . multivitamin with minerals  1 tablet Oral Daily  . potassium chloride  40 mEq Oral Daily  . sodium chloride flush  3 mL Intravenous Q12H     Infusions: . sodium chloride         Patient Profile   Gloria [name] is a 67 y.o. female w/ hx mildHFrEF, HLD, HTN, DM, depression, who was admittedon 05/01/2018 withdyspneawho is being seen today for the evaluation of newly reduced EF, acute systolic congestive heart failure. She underwent paracentesis with removal of 6 L of fluid.   Assessment/Plan   1. Acute on chronic combined CHF with new EF reduction: Echo as below shows significant drop at 20-25%, from 50-55% in 2017. Previous myoview showed EF of 45%. NICM with normal coronaries on cath 05/05/18. Cath numbers concerning for restrictive cardiomyopathy. - Echo 05/2018 LVEF 20-25% (Down from 50-55 in 2017), Grade 3 DD, Moderate MR, Mild LAE, Mild RV dilation, mildly reduced RV function, Mod TR, Mild PI, Pa peak pressure 44 mm Hg.  - Volume status remains markedly elevated on exam. - Increase lasix to 80 mg BID with an extra 40 now - Continue losartan 25 mg daily. Consider switch to [facility] as tolerated.  - Add spiro  12.5 mg daily - Add low dose coreg 3.125 mg BID.  - No digoxin for now with significant cirrhosis. Follow.   2. Cirrhosis with ascites - Paracentesis earlier this admission with 6 L off. LFTs stable. Not noted on CT 03/2001.  3. HTN - Will adjust meds in setting of treating her systolic CHF  4. Controlled DM2 - Hgb A1C 5.2 on admission. Per primary.   5. Depression  - Per primary.   6. OSA - Long standing, non-compliant with CPAP.   7. Obesity - Body mass index is 30.68 kg/m.  - Encouraged weight loss  Medication concerns reviewed with patient and pharmacy team. Barriers identified: Questionable compliance.   Length of Stay: 5  Annamaria Helling  05/06/2018, 9:29 AM  Advanced Heart Failure Team Pager [phone] (M-F; 7a - 4p)  Please contact [facility] for night-coverage after hours (4p -7a ) and weekends on [email or web address]  Patient seen with PA, agree with the above note.   She was admitted with progressive exertional dyspnea.  Has been short of breath x months.  Echo with EF  20-25% and mildly decreased RV systolic function.  Also noted to have cirrhosis and ascites requiring paracentesis.  RHC/LHC with no significant coronary disease and markedly elevated right and left heart filling pressures in restrictive pattern.   On exam, JVP 16 cm. Reg S1S2, no significant murmur.  Abdomen is moderately distended but soft.   1. Acute on probably chronic systolic CHF: Nonischemic cardiomyopathy.  Echo this admission with EF 20-25%, mildly decreased RV systolic function, moderate MR.  Cath with no significant coronary disease, markedly elevated right and left heart filling pressures in a restrictive pattern.  Cardiac index preserved.   HIV negative, no ETOH, no family history of CHF.  She has been diuresed since arrival, still with severe volume overload on exam. 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  - Increase  Lasix to 80 mg IV bid. Needs considerably more diuresis.  - Add spironolactone as above.  - Continue current losartan and Coreg today.  Tomorrow, if BP stable, transition to [employer].  - I will arrange for cardiac MRI to look for infiltrative disease, evidence for myocarditis.  2. Cirrhosis: Uncertain etiology.  Admitted with significant ascites.  Hepatitis labs negative.  Possible that she has cirrhosis/ascites related to RV failure. She had paracentesis earlier in stay.  - Continue diuresis, will push spironolactone.  - May need another paracentesis prior to discharge.   Loralie Champagne 05/06/2018 11:53 AM

## 2018-05-06 NOTE — Care Management Important Message (Signed)
Important Message  Patient Details  Name: Gloria [name] MRN: [id number] Date of Birth: 10-11-1950   Medicare Important Message Given:  Yes    Jaymison Luber P Judieth Mckown 05/06/2018, 3:35 PM

## 2018-05-06 NOTE — Progress Notes (Signed)
Physical Therapy Treatment & Discharge Patient Details Name: Gloria [name] MRN: [id number] DOB: 18-Jun-1951 Today's Date: 05/06/2018    History of Present Illness Pt is a 67 y.o. female admitted 05/01/18 with c/o dypsnea.  Pt s/p paracentesis with 6L fluid removal on 11/1. RUQ ultrasound showed significant cirrhosis. CT angio chest negative for PE, but showed massive ascites and ansarca. S/P R/LHC on 11/5. PMH includes CHF, HTN, asthma, OSA noncompliant with CPAP, DM2, polyneuropathy, HLD.    PT Comments    Pt performing transfers, ambulation and ADLs independently. Easily fatigued with minimal activity; requiring max encouragement for OOB mobility. Encouraged pt to continue ambulating during hospital admission. Pt has met short-term acute PT goals. Has no further questions or concerns. D/c acute PT.   Follow Up Recommendations  Home health PT;Supervision - Intermittent     Equipment Recommendations  Rolling walker with 5" wheels    Recommendations for Other Services       Precautions / Restrictions Precautions Precautions: None Restrictions Weight Bearing Restrictions: No    Mobility  Bed Mobility Overal bed mobility: Independent                Transfers Overall transfer level: Independent                  Ambulation/Gait Ambulation/Gait assistance: Independent Gait Distance (Feet): 80 Feet Assistive device: None Gait Pattern/deviations: Step-through pattern;Decreased stride length Gait velocity: Decreased Gait velocity interpretation: 1.31 - 2.62 ft/sec, indicative of limited community ambulator General Gait Details: Slow ambulation without DME; DOE 2/4. Pt acting very fatigued   Marine scientist [place] (Stroke Patients Only)       Balance Overall balance assessment: No apparent balance deficits (not formally assessed)                                          Cognition  Arousal/Alertness: Awake/alert Behavior During Therapy: Flat affect Overall Cognitive Status: Within Functional Limits for tasks assessed                                 General Comments: Encouragement to participate      Exercises      General Comments        Pertinent Vitals/Pain Pain Assessment: No/denies pain    Home Living                      Prior Function            PT Goals (current goals can now be found in the care plan section) Progress towards PT goals: Goals met/education completed, patient discharged from PT    Frequency    Min 3X/week      PT Plan Current plan remains appropriate    Co-evaluation              AM-PAC PT "6 Clicks" Daily Activity  Outcome Measure  Difficulty turning over in bed (including adjusting bedclothes, sheets and blankets)?: None Difficulty moving from lying on back to sitting on the side of the bed? : None Difficulty sitting down on and standing up from a chair with arms (e.g., wheelchair, bedside commode, etc,.)?: None Help needed moving to and from a bed to chair (  including a wheelchair)?: None Help needed walking in hospital room?: None Help needed climbing 3-5 steps with a railing? : A Little 6 Click Score: 23    End of Session Equipment Utilized During Treatment: Gait belt Activity Tolerance: Patient limited by fatigue Patient left: in chair;with call bell/phone within reach Nurse Communication: Mobility status PT Visit Diagnosis: Difficulty in walking, not elsewhere classified (R26.2);Muscle weakness (generalized) (M62.81)     Time: 6725-5001 PT Time Calculation (min) (ACUTE ONLY): 11 min  Charges:  $Gait Training: 8-22 mins                    Mabeline Caras, PT, DPT Acute Rehabilitation Services  Pager [phone] Office [place] 05/06/2018, 12:41 PM

## 2018-05-06 NOTE — Progress Notes (Signed)
PROGRESS NOTE  Gloria [name] HXT:[id number] DOB: 13-Aug-1950 DOA: 05/01/2018 PCP: Biagio Borg, MD  HPI/Recap of past 24 hours: Gloria [name] is a 67 y.o. female with medical history significant for chronic diastolic CHF, hypertension, asthma not treated, OSA noncompliant with CPAP, type 2 diabetes, polyneuropathy, hyperlipidemia, who presented to ED [facility] brought in by her sister and brother-in-law due to worsening shortness of breath at rest.  Patient is alert and oriented x3.  Reports that in the last 2 months she has been having progressive worsening shortness of breath.  Associated with increasing abdominal girth and weight loss more than 20 pounds in the last 2 months.  Did not seek help for this but states that she has a PCP that she sees once a year.  Denies any chest pain or palpitations.  Denies melena, hematochezia or hematemesis.  Denies fever, chills, nausea, abdominal pain, diarrhea or constipation.  Had paracentesis by IR on 05/01/18 with 6L of fluid removed.  Abdomen ultrasound revealed cirrhosis of the liver.  Negative hepatitis panel.  Hospital course complicated by newly diagnosed systolic CHF and intermittent chest pain. L and R HC today.  05/05/18: Admits to intermittent chest pain this am. No other complaints. Heart cath planned today. Cardiology following.  05/06/2018: Patient seen and examined at bedside.  No acute events overnight.  Denies any chest pain.  Post L HC POD #1 with findings of widely patent coronary arteries, moderate pulmonary hypertension, LVEF by echocardiography less than 25%.  Cardiology recommended to continue diuresis and to consider advanced heart failure team evaluation.  No indication for antiplatelet therapy at this time per cardiology.  Assessment/Plan: Principal Problem:   Acute systolic (congestive) heart failure (HCC) Active Problems:   Hyperlipidemia   Essential hypertension   Uncontrolled type 2 diabetes mellitus with diabetic neuropathy,  without long-term current use of insulin (HCC)   Morbid obesity (HCC)   OSA (obstructive sleep apnea)   Ascites of liver   Anasarca   Cirrhosis of liver with ascites (HCC)  Acute on chronic combined systolic and diastolic CHF 2D echo done on 03/21/2016 revealed preserved LVEF 50 to 55% with grade 1 diastolic dysfunction Repeated 2D echo done on 16/10/5372 revealed systolic CHF with LVEF 20 to 25% and grade 3 diastolic dysfunction. Cardiology consulted to further assess and to manage newly diagnosed systolic CHF. Currently on Lasix IV 40 mg daily and losartan 25 mg daily Right and left heart cath findings of widely patent coronary arteries.  No indication for antiplatelet therapy per cardiology Advanced heart failure team consulted  Newly diagnosed systolic CHF The etiology of her worsening heart failure is unclear Cardiology following Medications as stated above  Newly diagnosed cirrhosis of the liver with ascites Findings per abdomen ultrasound, cirrhosis of the liver Negative hepatitis panel and negative HIV screening 6 L of fluid removed by interventional radiologist on 05/01/2018 On lactulose and Lasix Will need follow-up with GI  Hyperammonemia Continue lactulose 30 g daily  Resolved hypokalemia post repletion  Chronic normocytic anemia Drop in hemoglobin from 11 to 9 possibly dilutional from fluid overload No sign of overt bleeding  Generalized weakness/physical debility/ambulatory dysfunction PT assessed and recommended home health PT Fall precautions Continue oral supplement   Code Status: Full code  Family Communication: Spoke with her son on the phone.  Updated him on her current medical status.  Disposition Plan: Home when hemodynamically stable or when cardiology and heart failure team sign off.   Consultants:  Radiology  Procedures:  Paracentesis by interventional radiology  Antimicrobials:  None  DVT prophylaxis: Subcu Lovenox  daily   Objective: Vitals:   05/06/18 0022 05/06/18 0444 05/06/18 0900 05/06/18 1236  BP: 113/80 118/73 115/67 (!) 76/59  Pulse: 90 86 89 84  Resp: 18 18  17   Temp: 98.5 F (36.9 C) 98.5 F (36.9 C)  98.3 F (36.8 C)  TempSrc: Oral Oral  Oral  SpO2: 98% 95% 97% 96%  Weight:  86.2 kg    Height:        Intake/Output Summary (Last 24 hours) at 05/06/2018 1236 Last data filed at 05/06/2018 1147 Gross per 24 hour  Intake 614.99 ml  Output 5200 ml  Net -4585.01 ml   Filed Weights   05/04/18 0611 05/05/18 0356 05/06/18 0444  Weight: 90.7 kg 88.5 kg 86.2 kg    Exam:  . General: 67 y.o. year-old female well-developed well-nourished in no acute distress.  Alert and oriented x3.   . Cardiovascular: Regular rate and rhythm with no rubs or gallops.  No JVD or thyromegaly noted.  Marland Kitchen Respiratory: Clear to auscultation with no wheezes or rales.  Good inspiratory effort. . Abdomen: Soft nontender moderately distended with normal bowel sounds x4 quadrants. . Musculoskeletal: Improved pitting edema in lower extremities bilaterally.  2 out of 4 pulses in all 4 extremities. Marland Kitchen Psychiatry: Mood is appropriate for condition and setting   Data Reviewed: CBC: Recent Labs  Lab 05/01/18 1249 05/02/18 0613 05/03/18 0527 05/05/18 0455  WBC 8.0 7.3 7.3 8.1  NEUTROABS  --  6.0 6.0  --   HGB 11.2* 9.7* 9.9* 10.2*  HCT 35.5* 30.3* 32.3* 33.3*  MCV 90.6 89.6 90.5 91.0  PLT 282 233 258 026   Basic Metabolic Panel: Recent Labs  Lab 05/01/18 1249 05/02/18 0613 05/03/18 0527 05/05/18 0455 05/06/18 0433  NA 136 137 136 136 137  K 2.9* 3.0* 3.5 3.9 4.2  CL 103 108 104 102 103  CO2 26 22 25 26 31   GLUCOSE 137* 141* 122* 94 71  BUN <5* <5* <5* <5* <5*  CREATININE 0.89 0.88 0.95 0.86 0.82  CALCIUM 8.9 8.3* 8.4* 8.7* 8.5*  MG  --  1.6* 1.9  --   --   PHOS  --   --  3.2  --   --    GFR: Estimated Creatinine Clearance: 73.7 mL/min (by C-G formula based on SCr of 0.82 mg/dL). Liver Function  Tests: Recent Labs  Lab 05/01/18 1249  AST 22  ALT 9  ALKPHOS 99  BILITOT 2.1*  PROT 8.1  ALBUMIN 3.0*   Recent Labs  Lab 05/01/18 1249  LIPASE 26   Recent Labs  Lab 05/01/18 2206  AMMONIA 36*   Coagulation Profile: Recent Labs  Lab 05/01/18 1253  INR 1.43   Cardiac Enzymes: No results for input(s): CKTOTAL, CKMB, CKMBINDEX, TROPONINI in the last 168 hours. BNP (last 3 results) No results for input(s): PROBNP in the last 8760 hours. HbA1C: No results for input(s): HGBA1C in the last 72 hours. CBG: Recent Labs  Lab 05/01/18 2015 05/01/18 2317 05/02/18 0540 05/03/18 0741 05/03/18 1231  GLUCAP 130* 103* 126* 115* 97   Lipid Profile: No results for input(s): CHOL, HDL, LDLCALC, TRIG, CHOLHDL, LDLDIRECT in the last 72 hours. Thyroid Function Tests: No results for input(s): TSH, T4TOTAL, FREET4, T3FREE, THYROIDAB in the last 72 hours. Anemia Panel: No results for input(s): VITAMINB12, FOLATE, FERRITIN, TIBC, IRON, RETICCTPCT in the last 72 hours. Urine analysis:    Component  Value Date/Time   COLORURINE YELLOW 05/01/2018 1249   APPEARANCEUR CLEAR 05/01/2018 1249   LABSPEC 1.009 05/01/2018 1249   PHURINE 7.0 05/01/2018 1249   GLUCOSEU NEGATIVE 05/01/2018 1249   GLUCOSEU NEGATIVE 02/25/2018 1500   HGBUR NEGATIVE 05/01/2018 1249   BILIRUBINUR NEGATIVE 05/01/2018 1249   BILIRUBINUR negatvie 11/04/2016 1312   KETONESUR NEGATIVE 05/01/2018 1249   PROTEINUR 30 (A) 05/01/2018 1249   UROBILINOGEN 0.2 02/25/2018 1500   NITRITE NEGATIVE 05/01/2018 1249   LEUKOCYTESUR NEGATIVE 05/01/2018 1249   Sepsis Labs: @LABRCNTIP (procalcitonin:4,lacticidven:4)  ) Recent Results (from the past 240 hour(s))  Culture, body fluid-bottle     Status: None   Collection Time: 05/01/18  5:23 PM  Result Value Ref Range Status   Specimen Description PERITONEAL  Final   Special Requests NONE  Final   Culture   Final    NO GROWTH 5 DAYS Performed at [facility], [place] [address] [place], [place] [address]    Report Status 05/06/2018 FINAL  Final  Gram stain     Status: None   Collection Time: 05/01/18  5:23 PM  Result Value Ref Range Status   Specimen Description PERITONEAL  Final   Special Requests NONE  Final   Gram Stain   Final    RARE WBC PRESENT, PREDOMINANTLY PMN NO ORGANISMS SEEN Performed at [facility], [place] [address], [place], [place] [address]    Report Status 05/01/2018 FINAL  Final      Studies: No results found.  Scheduled Meds: . carvedilol  3.125 mg Oral BID WC  . enoxaparin (LOVENOX) injection  40 mg Subcutaneous Q24H  . feeding supplement (GLUCERNA SHAKE)  237 mL Oral TID BM  . furosemide  80 mg Intravenous BID  . gabapentin  300 mg Oral BID  . lactulose  30 g Oral Daily  . losartan  25 mg Oral Daily  . multivitamin with minerals  1 tablet Oral Daily  . potassium chloride  40 mEq Oral Daily  . sodium chloride flush  3 mL Intravenous Q12H  . spironolactone  12.5 mg Oral Daily    Continuous Infusions: . sodium chloride       LOS: 5 days     Kayleen Memos, MD [facility] Pager [phone]  If 7PM-7AM, please contact night-coverage [email or web address] Password [facility] 05/06/2018, 12:36 PM

## 2018-05-07 DIAGNOSIS — R140 Abdominal distension (gaseous): Secondary | ICD-10-CM

## 2018-05-07 LAB — BASIC METABOLIC PANEL
Anion gap: 6 (ref 5–15)
BUN: 5 mg/dL — ABNORMAL LOW (ref 8–23)
CO2: 33 mmol/L — ABNORMAL HIGH (ref 22–32)
Calcium: 8.4 mg/dL — ABNORMAL LOW (ref 8.9–10.3)
Chloride: 97 mmol/L — ABNORMAL LOW (ref 98–111)
Creatinine, Ser: 0.96 mg/dL (ref 0.44–1.00)
GFR calc Af Amer: >60 mL/min (ref >60)
GFR calc non Af Amer: 60 mL/min — ABNORMAL LOW (ref >60)
Glucose, Bld: 69 mg/dL — ABNORMAL LOW (ref 70–99)
Potassium: 3.3 mmol/L — ABNORMAL LOW (ref 3.5–5.1)
Sodium: 136 mmol/L (ref 135–145)

## 2018-05-07 LAB — GLUCOSE, CAPILLARY
Glucose-Capillary: 90 mg/dL (ref 70–99)
Glucose-Capillary: 98 mg/dL (ref 70–99)

## 2018-05-07 MED ORDER — SACUBITRIL-VALSARTAN 24-26 MG PO TABS
1.0000 | ORAL_TABLET | Freq: Two times a day (BID) | ORAL | Status: DC
  Administered 2018-05-07 – 2018-05-11 (×5): 1 via ORAL
  Filled 2018-05-07 (×10): qty 1

## 2018-05-07 MED ORDER — POTASSIUM CHLORIDE CRYS ER 20 MEQ PO TBCR
40.0000 meq | EXTENDED_RELEASE_TABLET | Freq: Once | ORAL | Status: AC
  Administered 2018-05-07: 40 meq via ORAL
  Filled 2018-05-07: qty 2

## 2018-05-07 MED ORDER — ALBUMIN HUMAN 25 % IV SOLN
50.0000 g | Freq: Once | INTRAVENOUS | Status: DC
  Filled 2018-05-07: qty 200

## 2018-05-07 MED ORDER — POTASSIUM CHLORIDE CRYS ER 20 MEQ PO TBCR: 40.0000 meq | EXTENDED_RELEASE_TABLET | Freq: Once | ORAL | Status: DC

## 2018-05-07 NOTE — Plan of Care (Signed)
  Problem: Education: Goal: Knowledge of General Education information will improve Description Including pain rating scale, medication(s)/side effects and non-pharmacologic comfort measures Outcome: Progressing   Problem: Clinical Measurements: Goal: Ability to maintain clinical measurements within normal limits will improve Outcome: Progressing Goal: Cardiovascular complication will be avoided Outcome: Progressing   Problem: Pain Managment: Goal: General experience of comfort will improve Outcome: Progressing   Problem: Skin Integrity: Goal: Risk for impaired skin integrity will decrease Outcome: Progressing

## 2018-05-07 NOTE — Progress Notes (Addendum)
PROGRESS NOTE  Gloria [name] TDS:[id number] DOB: 1951/03/02 DOA: 05/01/2018 PCP: Biagio Borg, MD  HPI/Recap of past 24 hours: Gloria [name] is a 67 y.o. female with medical history significant for chronic diastolic CHF, hypertension, asthma not treated, OSA noncompliant with CPAP, type 2 diabetes, polyneuropathy, hyperlipidemia, who presented to ED [facility] brought in by her sister and brother-in-law due to worsening shortness of breath at rest.  Patient is alert and oriented x3.  Reports that in the last 2 months she has been having progressive worsening shortness of breath.  Associated with increasing abdominal girth and weight loss more than 20 pounds in the last 2 months.  Did not seek help for this but states that she has a PCP that she sees once a year.  Denies any chest pain or palpitations.  Denies melena, hematochezia or hematemesis.  Denies fever, chills, nausea, abdominal pain, diarrhea or constipation.  Had paracentesis by IR on 05/01/18 with 6L of fluid removed.  Abdomen ultrasound revealed cirrhosis of the liver.  Negative hepatitis panel.  Hospital course complicated by newly diagnosed systolic CHF and intermittent chest pain. L and R HC today.  05/05/18: Admits to intermittent chest pain this am. No other complaints. Heart cath planned today. Cardiology following.  05/06/2018: Post L HC POD #1 with findings of widely patent coronary arteries, moderate pulmonary hypertension, LVEF by echocardiography less than 25%.  Cardiology recommended to continue diuresis and to consider advanced heart failure team evaluation.  No indication for antiplatelet therapy at this time per cardiology.  05/07/2018: Patient seen and examined at bedside.  No acute events overnight.  Denies any chest pain, palpitations or dyspnea.  Heart failure team following and suspecting viral myocarditis.  Diuresing well.  We will hold off paracentesis for now.  Assessment/Plan: Principal Problem:   Acute systolic  (congestive) heart failure (HCC) Active Problems:   Hyperlipidemia   Essential hypertension   Uncontrolled type 2 diabetes mellitus with diabetic neuropathy, without long-term current use of insulin (HCC)   Morbid obesity (HCC)   OSA (obstructive sleep apnea)   Ascites of liver   Anasarca   Cirrhosis of liver with ascites (HCC)  Acute on chronic combined systolic and diastolic CHF 2D echo done on 03/21/2016 revealed preserved LVEF 50 to 55% with grade 1 diastolic dysfunction Repeated 2D echo done on 26/08/353 revealed systolic CHF with LVEF 20 to 25% and grade 3 diastolic dysfunction. Cardiology consulted to further assess and to manage newly diagnosed systolic CHF. Heart failure team suspect viral myocarditis Continue Coreg 3.125 mg twice daily, Lasix IV 80 mg twice daily, Spironolactone 12.5 mg daily Newly started Entresto 24-26 mg twice daily by heart failure team Heart failure team following.  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 to RV failure Findings per abdomen ultrasound, cirrhosis of the liver Negative hepatitis panel and negative HIV screening 6 L of fluid removed by interventional radiologist on 05/01/2018 On lactulose and Lasix Will need follow-up with GI Diuresing well Hold off paracentesis today 05/07/2018  Hypoglycemia Serum glucose 69 Not on antidiabetic medications Most likely due to liver failure CBG every 6 hours  Hypokalemia Potassium 3.3 Repleted Repeat BMP in the morning  Hyperammonemia Continue lactulose 30 g daily  Resolved hypokalemia post repletion  Chronic normocytic anemia Drop in hemoglobin from 11 to 9 possibly dilutional from fluid overload No sign of overt  bleeding  Generalized weakness/physical debility/ambulatory dysfunction PT assessed and recommended home health PT Fall precautions Continue oral supplement   Code Status: Full code  Family Communication: Spoke with her son on the phone.  Updated him on her current medical status.  Disposition Plan: Home when hemodynamically stable or when cardiology and heart failure team sign off.   Consultants:  Radiology  Cardiology  Heart failure team  Procedures:  Paracentesis by interventional radiology  Antimicrobials:  None  DVT prophylaxis: Subcu Lovenox daily   Objective: Vitals:   05/06/18 1935 05/07/18 0045 05/07/18 0437 05/07/18 0749  BP: 112/68 104/60 106/63 (!) 105/57  Pulse: 84 84 79 77  Resp: 18 18 18    Temp: 98.4 F (36.9 C) 98.1 F (36.7 C) 98.1 F (36.7 C) 98.4 F (36.9 C)  TempSrc: Oral Oral Oral Oral  SpO2: 97% 92% 91% 97%  Weight:   78.3 kg   Height:        Intake/Output Summary (Last 24 hours) at 05/07/2018 1028 Last data filed at 05/07/2018 0900 Gross per 24 hour  Intake 720 ml  Output 6800 ml  Net -6080 ml   Filed Weights   05/05/18 0356 05/06/18 0444 05/07/18 0437  Weight: 88.5 kg 86.2 kg 78.3 kg    Exam:  . General: 67 y.o. year-old female developed well-nourished in no acute distress.  Alert and oriented x3. . Cardiovascular: Regular rate and rhythm with no rubs or gallops.  No JVD or thyromegaly noted.   Marland Kitchen Respiratory: Clear to auscultation with no wheezes or rales.  Good inspiratory effort. . Abdomen: Soft, nontender moderately distended with normal bowel sounds x4 quadrants.  . Musculoskeletal: Improved pitting edema in lower extremities bilaterally.  2 out of 4 pulses in all 4 extremities. Marland Kitchen Psychiatry: Mood is appropriate for condition and setting   Data Reviewed: CBC: Recent Labs  Lab 05/01/18 1249 05/02/18 0613 05/03/18 0527 05/05/18 0455  WBC 8.0 7.3 7.3 8.1  NEUTROABS  --  6.0 6.0  --   HGB 11.2* 9.7* 9.9* 10.2*   HCT 35.5* 30.3* 32.3* 33.3*  MCV 90.6 89.6 90.5 91.0  PLT 282 233 258 093   Basic Metabolic Panel: Recent Labs  Lab 05/02/18 0613 05/03/18 0527 05/05/18 0455 05/06/18 0433 05/07/18 0419  NA 137 136 136 137 136  K 3.0* 3.5 3.9 4.2 3.3*  CL 108 104 102 103 97*  CO2 22 25 26 31  33*  GLUCOSE 141* 122* 94 71 69*  BUN <5* <5* <5* <5* 5*  CREATININE 0.88 0.95 0.86 0.82 0.96  CALCIUM 8.3* 8.4* 8.7* 8.5* 8.4*  MG 1.6* 1.9  --   --   --   PHOS  --  3.2  --   --   --    GFR: Estimated Creatinine Clearance: 60.1 mL/min (by C-G formula based on SCr of 0.96 mg/dL). Liver Function Tests: Recent Labs  Lab 05/01/18 1249  AST 22  ALT 9  ALKPHOS 99  BILITOT 2.1*  PROT 8.1  ALBUMIN 3.0*   Recent Labs  Lab 05/01/18 1249  LIPASE 26   Recent Labs  Lab 05/01/18 2206  AMMONIA 36*   Coagulation Profile: Recent Labs  Lab 05/01/18 1253  INR 1.43   Cardiac Enzymes: No results for input(s): CKTOTAL, CKMB, CKMBINDEX, TROPONINI in the last 168 hours. BNP (last 3 results) No results for input(s): PROBNP in the last  8760 hours. HbA1C: No results for input(s): HGBA1C in the last 72 hours. CBG: Recent Labs  Lab 05/01/18 2015 05/01/18 2317 05/02/18 0540 05/03/18 0741 05/03/18 1231  GLUCAP 130* 103* 126* 115* 97   Lipid Profile: No results for input(s): CHOL, HDL, LDLCALC, TRIG, CHOLHDL, LDLDIRECT in the last 72 hours. Thyroid Function Tests: No results for input(s): TSH, T4TOTAL, FREET4, T3FREE, THYROIDAB in the last 72 hours. Anemia Panel: No results for input(s): VITAMINB12, FOLATE, FERRITIN, TIBC, IRON, RETICCTPCT in the last 72 hours. Urine analysis:    Component Value Date/Time   COLORURINE YELLOW 05/01/2018 1249   APPEARANCEUR CLEAR 05/01/2018 1249   LABSPEC 1.009 05/01/2018 1249   PHURINE 7.0 05/01/2018 1249   GLUCOSEU NEGATIVE 05/01/2018 1249   GLUCOSEU NEGATIVE 02/25/2018 1500   HGBUR NEGATIVE 05/01/2018 1249   BILIRUBINUR NEGATIVE 05/01/2018 1249    BILIRUBINUR negatvie 11/04/2016 1312   KETONESUR NEGATIVE 05/01/2018 1249   PROTEINUR 30 (A) 05/01/2018 1249   UROBILINOGEN 0.2 02/25/2018 1500   NITRITE NEGATIVE 05/01/2018 1249   LEUKOCYTESUR NEGATIVE 05/01/2018 1249   Sepsis Labs: @LABRCNTIP (procalcitonin:4,lacticidven:4)  ) Recent Results (from the past 240 hour(s))  Culture, body fluid-bottle     Status: None   Collection Time: 05/01/18  5:23 PM  Result Value Ref Range Status   Specimen Description PERITONEAL  Final   Special Requests NONE  Final   Culture   Final    NO GROWTH 5 DAYS Performed at [facility], [place] [address], [place], [place] [address]    Report Status 05/06/2018 FINAL  Final  Gram stain     Status: None   Collection Time: 05/01/18  5:23 PM  Result Value Ref Range Status   Specimen Description PERITONEAL  Final   Special Requests NONE  Final   Gram Stain   Final    RARE WBC PRESENT, PREDOMINANTLY PMN NO ORGANISMS SEEN Performed at [facility], [place] [address], [place], [place] [address]    Report Status 05/01/2018 FINAL  Final      Studies: Mr Cardiac Morphology W Wo Contrast  Result Date: 05/06/2018 CLINICAL DATA:  Cardiomyopathy of uncertain etiology EXAM: CARDIAC MRI TECHNIQUE: The patient was scanned on a 1.5 Tesla GE magnet. A dedicated cardiac coil was used. Functional imaging was done using Fiesta sequences. 2,3, and 4 chamber views were done to assess for RWMA's. Modified Simpson's rule using a short axis stack was used to calculate an ejection fraction on a dedicated work Conservation officer, nature. The patient received 9 cc of Gadavist. After 10 minutes inversion recovery sequences were used to assess for infiltration and scar tissue. CONTRAST:  9 cc Gadavist FINDINGS: Limited images of the lung fields showed no gross abnormalities. Ascites was noted. Mildly dilated left ventricle with normal wall thickness. EF 35% with moderate diffuse hypokinesis. Mildly dilated right ventricle  with EF 34%. Mild left and right atrial enlargement. Trileaflet aortic valve with no regurgitation or stenosis. At least mild mitral regurgitation visually. Tricuspid regurgitation appears moderate. Delayed enhancement images were difficult, but there did not appear to be any myocardial late gadolinium enhancement (LGE). Measurements: LVEDV 199 mL LVSV 69 mL LVEF 35% RVEDV 211 mL RVSV 72 mL RVEF 34% IMPRESSION: 1.  Mildly dilated LV with EF 35%, diffuse hypokinesis. 2.  Mildly dilated RV with EF 34%, diffuse hypokinesis. 3. No myocardial LGE, so no definitive evidence for prior MI, infiltrative disease, or myocarditis. [employer] Electronically Signed   By: Katherene Ponto.D.  On: 05/06/2018 20:47    Scheduled Meds: . carvedilol  3.125 mg Oral BID WC  . enoxaparin (LOVENOX) injection  40 mg Subcutaneous Q24H  . feeding supplement (GLUCERNA SHAKE)  237 mL Oral TID BM  . furosemide  80 mg Intravenous BID  . gabapentin  300 mg Oral BID  . lactulose  30 g Oral Daily  . multivitamin with minerals  1 tablet Oral Daily  . potassium chloride  40 mEq Oral Daily  . potassium chloride  40 mEq Oral Once  . sacubitril-valsartan  1 tablet Oral BID  . sodium chloride flush  3 mL Intravenous Q12H  . spironolactone  12.5 mg Oral Daily    Continuous Infusions: . sodium chloride    . albumin human       LOS: 6 days     Kayleen Memos, MD [facility] Pager [phone]  If 7PM-7AM, please contact night-coverage [email or web address] Password [email or web address] 05/07/2018, 10:28 AM

## 2018-05-07 NOTE — Plan of Care (Signed)

## 2018-05-07 NOTE — Progress Notes (Signed)
[id number] Offered to walk with pt but she declined. Stated she was tired. Pt receptive to ed on CHF. Gave booklet and discussed signs/symptoms of CHF and when to call MD. Encouraged daily weights, 2000 mg sodium restriction, and 2LFR. Offered CRP 2 but pt declined. Encouraged her to walk later with staff. Graylon Good RN BSN 05/07/2018 2:31 PM

## 2018-05-07 NOTE — Progress Notes (Signed)
Patient ID: Gloria [name], female   DOB: 09/05/1950, 67 y.o.   MRN: [id number]     Advanced Heart Failure Rounding Note  PCP-Cardiologist: Skeet Latch, MD   Subjective:    Patient diuresed very well yesterday, weight trending down.  Creatinine stable.  SBP 100s-110s.  Breathing is improving.   Cardiac MRI: mild LV dilation with EF 35%, diffuse hypokinesis; mild RV dilation with EF 34%; no delayed enhancement.    Objective:   Weight Range: 78.3 kg Body mass index is 27.86 kg/m.   Vital Signs:   Temp:  [98.1 F (36.7 C)-98.4 F (36.9 C)] 98.4 F (36.9 C) (11/07 0749) Pulse Rate:  [77-91] 77 (11/07 0749) Resp:  [17-18] 18 (11/07 0437) BP: (104-127)/(57-82) 105/57 (11/07 0749) SpO2:  [91 %-98 %] 97 % (11/07 0749) Weight:  [78.3 kg] 78.3 kg (11/07 0437) Last BM Date: 05/06/18  Weight change: Filed Weights   05/05/18 0356 05/06/18 0444 05/07/18 0437  Weight: 88.5 kg 86.2 kg 78.3 kg    Intake/Output:   Intake/Output Summary (Last 24 hours) at 05/07/2018 0830 Last data filed at 05/07/2018 0044 Gross per 24 hour  Intake 720 ml  Output 5800 ml  Net -5080 ml      Physical Exam    General:  Well appearing. No resp difficulty HEENT: Normal Neck: Supple. JVP 12-14 cm.  No lymphadenopathy or thyromegaly appreciated. Cor: PMI nondisplaced. Regular rate & rhythm. No rubs, gallops or murmurs. Lungs: Clear Abdomen: Soft, nontender, mildly distended. No hepatosplenomegaly. No bruits or masses. Good bowel sounds. Extremities: No cyanosis, clubbing, rash. Trace ankle edema Neuro: Alert & orientedx3, cranial nerves grossly intact. moves all 4 extremities w/o difficulty. Affect pleasant   Telemetry   NSR (personally reviewed)  Labs    CBC Recent Labs    05/05/18 0455  WBC 8.1  HGB 10.2*  HCT 33.3*  MCV 91.0  PLT 397   Basic Metabolic Panel Recent Labs    05/06/18 0433 05/07/18 0419  NA 137 136  K 4.2 3.3*  CL 103 97*  CO2 31 33*  GLUCOSE 71 69*  BUN <5*  5*  CREATININE 0.82 0.96  CALCIUM 8.5* 8.4*   Liver Function Tests No results for input(s): AST, ALT, ALKPHOS, BILITOT, PROT, ALBUMIN in the last 72 hours. No results for input(s): LIPASE, AMYLASE in the last 72 hours. Cardiac Enzymes No results for input(s): CKTOTAL, CKMB, CKMBINDEX, TROPONINI in the last 72 hours.  BNP: BNP (last 3 results) Recent Labs    05/01/18 1247 05/01/18 2206 05/02/18 0614  BNP 1,514.4* 1,504.5* 1,255.5*    ProBNP (last 3 results) No results for input(s): PROBNP in the last 8760 hours.   D-Dimer No results for input(s): DDIMER in the last 72 hours. Hemoglobin A1C No results for input(s): HGBA1C in the last 72 hours. Fasting Lipid Panel No results for input(s): CHOL, HDL, LDLCALC, TRIG, CHOLHDL, LDLDIRECT in the last 72 hours. Thyroid Function Tests No results for input(s): TSH, T4TOTAL, T3FREE, THYROIDAB in the last 72 hours.  Invalid input(s): FREET3  Other results:   Imaging    Mr Cardiac Morphology W Wo Contrast  Result Date: 05/06/2018 CLINICAL DATA:  Cardiomyopathy of uncertain etiology EXAM: CARDIAC MRI TECHNIQUE: The patient was scanned on a 1.5 Tesla GE magnet. A dedicated cardiac coil was used. Functional imaging was done using Fiesta sequences. 2,3, and 4 chamber views were done to assess for RWMA's. Modified Simpson's rule using a short axis stack was used to calculate an ejection fraction  on a dedicated work Conservation officer, nature. The patient received 9 cc of Gadavist. After 10 minutes inversion recovery sequences were used to assess for infiltration and scar tissue. CONTRAST:  9 cc Gadavist FINDINGS: Limited images of the lung fields showed no gross abnormalities. Ascites was noted. Mildly dilated left ventricle with normal wall thickness. EF 35% with moderate diffuse hypokinesis. Mildly dilated right ventricle with EF 34%. Mild left and right atrial enlargement. Trileaflet aortic valve with no regurgitation or stenosis. At  least mild mitral regurgitation visually. Tricuspid regurgitation appears moderate. Delayed enhancement images were difficult, but there did not appear to be any myocardial late gadolinium enhancement (LGE). Measurements: LVEDV 199 mL LVSV 69 mL LVEF 35% RVEDV 211 mL RVSV 72 mL RVEF 34% IMPRESSION: 1.  Mildly dilated LV with EF 35%, diffuse hypokinesis. 2.  Mildly dilated RV with EF 34%, diffuse hypokinesis. 3. No myocardial LGE, so no definitive evidence for prior MI, infiltrative disease, or myocarditis.   Electronically Signed   By: Loralie Champagne M.D.   On: 05/06/2018 20:47      Medications:     Scheduled Medications: . carvedilol  3.125 mg Oral BID WC  . enoxaparin (LOVENOX) injection  40 mg Subcutaneous Q24H  . feeding supplement (GLUCERNA SHAKE)  237 mL Oral TID BM  . furosemide  80 mg Intravenous BID  . gabapentin  300 mg Oral BID  . lactulose  30 g Oral Daily  . multivitamin with minerals  1 tablet Oral Daily  . potassium chloride  40 mEq Oral Daily  . potassium chloride  40 mEq Oral Once  . sacubitril-valsartan  1 tablet Oral BID  . sodium chloride flush  3 mL Intravenous Q12H  . spironolactone  12.5 mg Oral Daily     Infusions: . sodium chloride    . albumin human       PRN Medications:  sodium chloride, acetaminophen, diphenhydrAMINE, labetalol, ondansetron (ZOFRAN) IV, ondansetron (ZOFRAN) IV, oxyCODONE, sodium chloride flush, triamcinolone cream    Patient Profile   Gloria [name] is a 67 y.o. female w/ hx mildHFrEF, HLD, HTN, DM, depression, who was admittedon11/01/2019withdyspneawho is being seen today for the evaluation of newly reduced EF, acutesystolic congestive heart failure. She underwent paracentesis with removal of 6 L of fluid.  Assessment/Plan   1. Acute on probably chronic systolic CHF: Nonischemic cardiomyopathy.  Echo this admission with EF 20-25%, mildly decreased RV systolic function, moderate MR.  Cath with no significant  coronary disease, markedly elevated right and left heart filling pressures in a restrictive pattern.  Cardiac index preserved.   HIV negative, no ETOH, no family history of CHF.  Cardiac MRI with LV EF 35%, RV EF 34%, and no delayed enhancement.  Most likely cause seems to be viral myocarditis. 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 On exam, she remains volume overloaded but is now diuresing well and weight is trending down.  - Continue Lasix 80 mg IV bid for at least another day.    - Continue spironolactone 12.5 daily.   - Continue Coreg 3.125 mg bid.   - BP stable today.  Will stop losartan and start Entresto 24/26 bid.  2. Cirrhosis: Uncertain etiology.  Admitted with significant ascites.  Hepatitis labs negative.  Possible that she has cirrhosis/ascites related to RV failure. She had paracentesis earlier in stay. Abdomen mildly distended but soft, may be able to avoid repeat paracentesis.  - Continue diuresis, will plan to  increase spironolactone tomorrow.     Out of bed, mobilize.   Length of Stay: 6  Loralie Champagne, MD  05/07/2018, 8:30 AM  Advanced Heart Failure Team Pager [phone] (M-F; 7a - 4p)  Please contact [facility] for night-coverage after hours (4p -7a ) and weekends on [email or web address]

## 2018-05-08 ENCOUNTER — Encounter (HOSPITAL_COMMUNITY): Payer: Self-pay | Admitting: Radiology

## 2018-05-08 ENCOUNTER — Inpatient Hospital Stay (HOSPITAL_COMMUNITY): Payer: Medicare Other

## 2018-05-08 HISTORY — PX: IR PARACENTESIS: IMG2679

## 2018-05-08 LAB — PROTEIN, PLEURAL OR PERITONEAL FLUID: Total protein, fluid: 3.9 g/dL

## 2018-05-08 LAB — BASIC METABOLIC PANEL
Anion gap: 8 (ref 5–15)
BUN: 5 mg/dL — ABNORMAL LOW (ref 8–23)
CO2: 33 mmol/L — ABNORMAL HIGH (ref 22–32)
Calcium: 8.6 mg/dL — ABNORMAL LOW (ref 8.9–10.3)
Chloride: 96 mmol/L — ABNORMAL LOW (ref 98–111)
Creatinine, Ser: 1.07 mg/dL — ABNORMAL HIGH (ref 0.44–1.00)
GFR calc Af Amer: >60 mL/min (ref >60)
GFR calc non Af Amer: 52 mL/min — ABNORMAL LOW (ref >60)
Glucose, Bld: 79 mg/dL (ref 70–99)
Potassium: 3.9 mmol/L (ref 3.5–5.1)
Sodium: 137 mmol/L (ref 135–145)

## 2018-05-08 LAB — GLUCOSE, CAPILLARY
Glucose-Capillary: 107 mg/dL — ABNORMAL HIGH (ref 70–99)
Glucose-Capillary: 118 mg/dL — ABNORMAL HIGH (ref 70–99)
Glucose-Capillary: 75 mg/dL (ref 70–99)
Glucose-Capillary: 83 mg/dL (ref 70–99)

## 2018-05-08 MED ORDER — SPIRONOLACTONE 25 MG PO TABS: 25.0000 mg | ORAL_TABLET | Freq: Two times a day (BID) | ORAL | Status: DC

## 2018-05-08 MED ORDER — LIDOCAINE HCL 1 % IJ SOLN
INTRAMUSCULAR | Status: DC | PRN
  Administered 2018-05-08: 10 mL

## 2018-05-08 MED ORDER — LIDOCAINE HCL 1 % IJ SOLN
INTRAMUSCULAR | Status: AC
  Filled 2018-05-08: qty 20

## 2018-05-08 MED ORDER — SPIRONOLACTONE 25 MG PO TABS
25.0000 mg | ORAL_TABLET | Freq: Every day | ORAL | Status: DC
Start: 2018-05-09 — End: 2018-05-10
  Filled 2018-05-08: qty 1

## 2018-05-08 MED ORDER — SPIRONOLACTONE 25 MG PO TABS
25.0000 mg | ORAL_TABLET | Freq: Every day | ORAL | Status: DC
  Administered 2018-05-08: 25 mg via ORAL

## 2018-05-08 MED ORDER — FUROSEMIDE 10 MG/ML IJ SOLN
80.0000 mg | Freq: Two times a day (BID) | INTRAMUSCULAR | Status: AC
  Administered 2018-05-08: 80 mg via INTRAVENOUS
  Filled 2018-05-08: qty 8

## 2018-05-08 MED ORDER — ENSURE ENLIVE PO LIQD: 237.0000 mL | Freq: Two times a day (BID) | ORAL | Status: DC

## 2018-05-08 MED ORDER — TORSEMIDE 20 MG PO TABS
40.0000 mg | ORAL_TABLET | Freq: Every day | ORAL | Status: DC
  Administered 2018-05-11: 40 mg via ORAL
  Filled 2018-05-08 (×2): qty 2

## 2018-05-08 NOTE — Progress Notes (Signed)
PT Cancellation Note  Patient Details Name: Gloria [name] MRN: [id number] DOB: 01/07/1951   Cancelled Treatment:    Reason Eval/Treat Not Completed: PT screened, no needs identified, will sign off. Pt participated with PT treatment and was independent with mobility (see PT progress note 05/06/18).   Acknowledged new PT order from Dr. Aundra Dubin this afternoon; spoke with pt who reports she is still independent and declines any OOB mobility despite max education/importance (noted she has declined cardiac rehab and nursing staff as well). RN notified. Please reconsult if new needs arise.  Mabeline Caras, PT, DPT Acute Rehabilitation Services  Pager [phone] Office [place] 05/08/2018, 3:27 PM

## 2018-05-08 NOTE — Progress Notes (Signed)
Patient has hosp. follow-up appt in the [facility] on 05/18/18 at 2:30pm.

## 2018-05-08 NOTE — Procedures (Signed)
PROCEDURE SUMMARY:  Successful US guided paracentesis from RLQ.  Yielded 4.1 of clear yellow fluid.  No immediate complications.  Pt tolerated well.   Specimen was not sent for labs.  Ascencion Dike PA-C 05/08/2018 4:07 PM

## 2018-05-08 NOTE — Consult Note (Signed)
[facility] Consult: 2:11 PM 05/08/2018  LOS: 7 days    Referring Provider: Dr Irene Pap  Primary Care Physician:  Biagio Borg, MD Primary Gastroenterologist:  Dr Carlyn Reichert.      Reason for Consultation: Evaluate patient with new diagnosis of cirrhosis   HPI: Gloria [name] is a 67 y.o. female.  PMH Nonischemic cardiomyopathy.  Morbid obesity.  Obstructive sleep apnea.  DM 2, diet controlled.  Prior surgeries include tubal ligation, right shoulder, abdominal hysterectomy, diagnostic laparoscopy.  10/2010 Colonoscopy.  Routine risk screening.  He removed to polyps (tubular adenoma and serrated adenoma.  Severe sigmoid diverticulosis. 01/2016 Colonoscopy.  Polyp surveillance.  Findings included pandiverticulosis, internal hemorrhoids ascending colon lipoma.  No recurrent polyps.    Dr. Jenny Reichmann ordered ultrasound of the abdomen 03/05/2018 evaluate Avril months of abdominal swelling.  The liver had a heterogeneous echotexture, possibly fatty infiltration and/or hepatocellular disease.  Portal vein patent with normal directional flow.  Probable gallbladder sludge with 4.3 mm thick gallbladder wall.  Moderate ascites.  Patient currently admitted with acute on chronic heart failure.  Cardiac MRI 05/06/2018 shows mild LV dilation with EF 35%, diffuse hypokinesis, mild RV dilatation.  On TEE the EF is 20-25%.  Left heart catheterization performed 05/06/2018 with widely patent coronary arteries, moderate pulmonary hypertension.  Dr. Algernon Huxley, cardiologist has been guiding diuresis.   Her BUN and creatinine have risen slightly today compared with previous several days. CT of the abdomen pelvis with contrast 05/01/2018 for evaluation of swelling and abdominal pain revealed heterogeneous liver enhancement which resolves on delayed  phases consistent with a "nutmeg" appearance, likely passive congestion.  Radiologist stated "no definitive changes of cirrhosis".  Massive ascites 05/01/2018 ultrasound showed sludge, small stones in the gallbladder.  CBD 3 mm.  Liver diffusely heterogenic compatible with cirrhosis.  Portal vein patent and directional flow normal, splenomegaly.  Hepatic ascites.   Underwent 6 L paracentesis on 05/01/2018.  Fluid WBCs 88.    Reviewing LFTs for the last several months, mostly normal although on the day of admission her alk phos was elevated at 146, albumin low at 3.  Otherwise normal LFTs. INR 1.4. Platelets normal.  Hgb baseline from several months ago was 11-11.6.  In the hospital it has ranged 9.9-0.2.  Hgb 10.2 today.  No signif ETOH hx, does not drink.  Has never been aware of any problems with her liver. In the review of systems she has had breakouts of lesions on her arms and upper back which are pruritic.  She has not been referred to a dermatologist for these. Swelling is in the abdomen, not the limbs.  Overall she was never an obese woman, just overweight.  A few years back she had some autoimmune tests and there was mention of referral to rheumatology but she does not carry a diagnosis of rheumatologic disorder.  Problems with excessive bleeding or bruising.  Has a good appetite, no nausea, no abdominal pain.  Abdominal swelling much improved after the paracentesis.  Denies family history of liver disease and colorectal cancer.  Charting mentions that her grandmother had colorectal cancer.  Past Medical History:  Diagnosis Date  . Allergic rhinitis, cause unspecified 03/30/2013  . Anxiety state, unspecified   . Arthritis    "knees" (05/01/2018)  . Asthma    hx (05/01/2018)  . CHF (congestive heart failure) ([place])   . Chronic bronchitis   . Depressive disorder, not elsewhere classified   . Difficulty waking    hard to wake up past sedation!  . Migraine, unspecified, without mention of  intractable migraine without mention of status migrainosus   . OSA (obstructive sleep apnea) 12/25/2016  . Other and unspecified hyperlipidemia   . Panic attacks   . Postmenopausal symptoms   . Type 2 diabetes, diet controlled ([place])   . Unspecified essential hypertension   . Unspecified urinary incontinence   . UTI (lower urinary tract infection)    on 02/05/16/on meds    Past Surgical History:  Procedure Laterality Date  . ABDOMINAL HYSTERECTOMY  2002  . DIAGNOSTIC LAPAROSCOPY    . OPEN REDUCTION SHOULDER DISLOCATION Right   . RIGHT/LEFT HEART CATH AND CORONARY ANGIOGRAPHY N/A 05/05/2018   Procedure: RIGHT/LEFT HEART CATH AND CORONARY ANGIOGRAPHY;  Surgeon: Belva Crome, MD;  Location: [facility];  Service: Cardiovascular;  Laterality: N/A;  . TUBAL LIGATION    . TUMOR EXCISION Right    "ankle"    Prior to Admission medications   Medication Sig Start Date End Date Taking? Authorizing Provider  furosemide (LASIX) 20 MG tablet Take 1 tablet (20 mg total) by mouth daily. 02/25/18  Yes Biagio Borg, MD  magic mouthwash SOLN Take 5 mLs by mouth 3 (three) times daily as needed for mouth pain. 02/25/18  Yes Biagio Borg, MD  triamcinolone cream (KENALOG) 0.1 % Apply 1 application topically 2 (two) times daily. Patient taking differently: Apply 1 application topically 2 (two) times daily as needed (rash).  02/25/18 02/25/19 Yes Biagio Borg, MD  glucose blood (ACCU-CHEK AVIVA) test strip Use as instructed four times per day 08/26/17   Philemon Kingdom, MD  Insulin Glargine (LANTUS SOLOSTAR) 100 UNIT/ML Solostar Pen Inject 24 Units into the skin daily. Patient not taking: Reported on 05/01/2018 08/26/17   Philemon Kingdom, MD  Insulin Pen Needle (BD PEN NEEDLE NANO U/F) 32G X 4 MM MISC USE ONCE DAILY 08/26/17   Philemon Kingdom, MD  irbesartan (AVAPRO) 150 MG tablet Take 150 mg by mouth daily.    [provider]  irbesartan (AVAPRO) 300 MG tablet Take 1 tablet (300 mg total)  by mouth daily. Patient not taking: Reported on 05/01/2018 11/06/16   Biagio Borg, MD  metFORMIN (GLUCOPHAGE-XR) 500 MG 24 hr tablet Take 1 tablet (500 mg total) by mouth 2 (two) times daily with a meal. Patient not taking: Reported on 05/01/2018 08/26/17   Philemon Kingdom, MD  potassium chloride (K-DUR,KLOR-CON) 10 MEQ tablet Take 10 mEq by mouth daily.    [provider]    Scheduled Meds: . carvedilol  3.125 mg Oral BID WC  . enoxaparin (LOVENOX) injection  40 mg Subcutaneous Q24H  . feeding supplement (GLUCERNA SHAKE)  237 mL Oral TID BM  . furosemide  80 mg Intravenous BID  . gabapentin  300 mg Oral BID  . lactulose  30 g Oral Daily  . multivitamin with minerals  1 tablet Oral Daily  . potassium chloride  40 mEq Oral Daily  . sacubitril-valsartan  1 tablet Oral BID  . sodium chloride flush  3  mL Intravenous Q12H  . [START ON 05/09/2018] spironolactone  25 mg Oral Daily  . [START ON 05/09/2018] torsemide  40 mg Oral Daily   Infusions: . sodium chloride    . albumin human     PRN Meds: sodium chloride, acetaminophen, diphenhydrAMINE, labetalol, ondansetron (ZOFRAN) IV, oxyCODONE, sodium chloride flush, triamcinolone cream   Allergies as of 05/01/2018 - Review Complete 05/01/2018  Allergen Reaction Noted  . Compazine Other (See Comments) 11/21/2010  . Haloperidol decanoate Other (See Comments) 11/08/2010  . Penicillins Nausea And Vomiting 11/02/2010  . Glipizide Other (See Comments) 12/20/2016  . Metformin and related Other (See Comments) 10/03/2015  . Codeine Itching   . Lisinopril Cough 04/15/2012    Family History  Problem Relation Age of Onset  . Arthritis Unknown        family history  . Hypertension Unknown        grandparent  . Colon cancer Maternal Grandfather   . Asthma Son   . Rheum arthritis Mother   . Hypertension Mother   . Diabetes Maternal Uncle   . Diabetes Paternal Uncle   . Diabetes Maternal Grandmother     Social History    Socioeconomic History  . Marital status: Divorced    Spouse name: Not on file  . Number of children: 1  . Years of education: Not on file  . Highest education level: Not on file  Occupational History  . Occupation: RETIRED ---Self employed-interior design/flower arrangements  Social Needs  . Financial resource strain: Not on file  . Food insecurity:    Worry: Not on file    Inability: Not on file  . Transportation needs:    Medical: Not on file    Non-medical: Not on file  Tobacco Use  . Smoking status: Never Smoker  . Smokeless tobacco: Never Used  Substance and Sexual Activity  . Alcohol use: Never    Alcohol/week: 0.0 standard drinks    Frequency: Never  . Drug use: Never  . Sexual activity: Not Currently  Lifestyle  . Physical activity:    Days per week: Not on file    Minutes per session: Not on file  . Stress: Not on file  Relationships  . Social connections:    Talks on phone: Not on file    Gets together: Not on file    Attends religious service: Not on file    Active member of club or organization: Not on file    Attends meetings of clubs or organizations: Not on file    Relationship status: Not on file  . Intimate partner violence:    Fear of current or ex partner: Not on file    Emotionally abused: Not on file    Physically abused: Not on file    Forced sexual activity: Not on file  Other Topics Concern  . Not on file  Social History Narrative   -Retired - does Company secretary   -Divorced in 2007   -One son - lives locally. 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 and entertain family. Bowling. Interior decorating       REVIEW OF SYSTEMS: Constitutional: Feels fatigue and weakness now for a few weeks. ENT:  No nose bleeds Pulm: Shortness of breath improved CV: Per HPI. GU:  No hematuria, no frequency GI: Per HPI. Heme: Her HPI Transfusions: No previous blood transfusions. Neuro:  No headaches, no  peripheral tingling or numbness.  No  seizures, no syncope. Derm:  No itching, no rash or sores.  Endocrine:  No sweats or chills.  No polyuria or dysuria Immunization: Reviewed, do not see any vaccinations since 2011.     PHYSICAL EXAM: Vital signs in last 24 hours: Vitals:   05/07/18 2121 05/08/18 0454  BP: (!) 94/45 110/63  Pulse: 74 77  Resp:  20  Temp:  98.5 F (36.9 C)  SpO2:  95%   Wt Readings from Last 3 Encounters:  05/08/18 73.1 kg  02/25/18 84.4 kg  02/02/18 84.8 kg    General: Ill-appearing, looks tired.  Appears stated age. Head: No facial asymmetry or swelling.  No signs of head trauma. Eyes: No scleral icterus.  No conjunctival pallor. Ears: Not hard of hearing Nose: No congestion or discharge Mouth: Mucosa moist, pink, clear.  Tongue midline. Neck: No JVD, no masses, no thyromegaly Lungs: Some crackles in the bases.  Otherwise clear.  No labored breathing or cough. Heart: RRR.  No MRG.  S1, S2 present Abdomen: Soft.  Large but not distended or protuberant.  No HSM, masses, bruits, hernias.   Rectal: Deferred Musc/Skeltl: No joint redness or swelling. Extremities: Very mild, nonpitting pedal edema. Neurologic: Oriented x3.  No tremor or asterixis.  Moves all 4 limbs, strength not tested. Skin: Innumerable lesions on the arms and back of the neck/upper back some of these are excoriated and she is scratching at these.  The skin beneath this is hyperpigmented suggesting that she is been scratching for a while. Tattoos: None observed Nodes: No cervical adenopathy. Psych: Seems depressed.  Cooperative.  Answers all questions appropriately.  Intake/Output from previous day: 11/07 0701 - 11/08 0700 In: 1200 [P.O.:1200] Out: 6800 [Urine:6800] Intake/Output this shift: Total I/O In: 830 [P.O.:830] Out: 2000 [Urine:2000]  LAB RESULTS: No results for input(s): WBC, HGB, HCT, PLT in the last 72 hours. BMET Lab Results  Component Value Date   NA 137  05/08/2018   NA 136 05/07/2018   NA 137 05/06/2018   K 3.9 05/08/2018   K 3.3 (L) 05/07/2018   K 4.2 05/06/2018   CL 96 (L) 05/08/2018   CL 97 (L) 05/07/2018   CL 103 05/06/2018   CO2 33 (H) 05/08/2018   CO2 33 (H) 05/07/2018   CO2 31 05/06/2018   GLUCOSE 79 05/08/2018   GLUCOSE 69 (L) 05/07/2018   GLUCOSE 71 05/06/2018   BUN 5 (L) 05/08/2018   BUN 5 (L) 05/07/2018   BUN <5 (L) 05/06/2018   CREATININE 1.07 (H) 05/08/2018   CREATININE 0.96 05/07/2018   CREATININE 0.82 05/06/2018   CALCIUM 8.6 (L) 05/08/2018   CALCIUM 8.4 (L) 05/07/2018   CALCIUM 8.5 (L) 05/06/2018   LFT No results for input(s): PROT, ALBUMIN, AST, ALT, ALKPHOS, BILITOT, BILIDIR, IBILI in the last 72 hours. PT/INR Lab Results  Component Value Date   INR 1.43 05/01/2018   Hepatitis Panel No results for input(s): HEPBSAG, HCVAB, HEPAIGM, HEPBIGM in the last 72 hours. C-Diff No components found for: CDIFF Lipase     Component Value Date/Time   LIPASE 26 05/01/2018 1249    Drugs of Abuse     Component Value Date/Time   LABOPIA NONE DETECTED 05/01/2018 1249   COCAINSCRNUR NONE DETECTED 05/01/2018 1249   LABBENZ NONE DETECTED 05/01/2018 1249   AMPHETMU NONE DETECTED 05/01/2018 1249   THCU NONE DETECTED 05/01/2018 1249   LABBARB NONE DETECTED 05/01/2018 1249     RADIOLOGY STUDIES: Mr Cardiac Morphology W Wo Contrast  Result  Date: 05/06/2018 CLINICAL DATA:  Cardiomyopathy of uncertain etiology EXAM: CARDIAC MRI TECHNIQUE: The patient was scanned on a 1.5 Tesla GE magnet. A dedicated cardiac coil was used. Functional imaging was done using Fiesta sequences. 2,3, and 4 chamber views were done to assess for RWMA's. Modified Simpson's rule using a short axis stack was used to calculate an ejection fraction on a dedicated work Conservation officer, nature. The patient received 9 cc of Gadavist. After 10 minutes inversion recovery sequences were used to assess for infiltration and scar tissue. CONTRAST:  9 cc  Gadavist FINDINGS: Limited images of the lung fields showed no gross abnormalities. Ascites was noted. Mildly dilated left ventricle with normal wall thickness. EF 35% with moderate diffuse hypokinesis. Mildly dilated right ventricle with EF 34%. Mild left and right atrial enlargement. Trileaflet aortic valve with no regurgitation or stenosis. At least mild mitral regurgitation visually. Tricuspid regurgitation appears moderate. Delayed enhancement images were difficult, but there did not appear to be any myocardial late gadolinium enhancement (LGE). Measurements: LVEDV 199 mL LVSV 69 mL LVEF 35% RVEDV 211 mL RVSV 72 mL RVEF 34% IMPRESSION: 1.  Mildly dilated LV with EF 35%, diffuse hypokinesis. 2.  Mildly dilated RV with EF 34%, diffuse hypokinesis. 3. No myocardial LGE, so no definitive evidence for prior MI, infiltrative disease, or myocarditis. Dalton Mclean Electronically Signed   By: Loralie Champagne M.D.   On: 05/06/2018 20:47      IMPRESSION:   *  Possible cirrhosis of the liver, ? Cardiac/passive congestion etiology Patient has no history of significant alcohol intake. Back in 2017 her ANA titer was elevated at 1:320 with homogeneous pattern.   *    Ascites.  6 L paracentesis on 11/1, no evidence for SBP.  *    CHF.    *    Normocytic anemia.  *   Hx colon polyps, none recurrent in 2017.      PLAN:     *   Tomorrow morning ordered several autoimmune markers, alpha-1 antitrypsin, ceruloplasmin level. Recheck PT/INR in the morning.  Also, given that she is a bit somnolent but not overtly confused, will check ammonia level.  *   Patient may need liver biopsy to confirm whether or not she truly has cirrhosis.Azucena Freed  05/08/2018, 2:11 PM Phone [phone]

## 2018-05-08 NOTE — Progress Notes (Addendum)
Benefit check for Entresto in progress; patient is active with [facility] at home as prior to admission: Gloria [name] [phone]

## 2018-05-08 NOTE — Progress Notes (Signed)
CARDIAC REHAB PHASE I   Offered to walk with pt, pt declining at this time, no reason given as to why. Pt denies questions or concerns over CHF education. Will continue to follow and encourage mobilization.  Rufina Falco, RN BSN 05/08/2018 11:24 AM

## 2018-05-08 NOTE — Care Management (Signed)
#   2.  S/W EBONY @  [facility]  RX # [id number]   ENTRESTO   24/26 MG BID COVER- YES CO-PAY-  $ 45.00 TIER- 3 DRUG PRIOR APPROVAL- NO  NO DEDUCTIBLE  PREFERRED PHARMACY: YES--  [facility]

## 2018-05-08 NOTE — Progress Notes (Signed)
PROGRESS NOTE  Gloria [name] YCX:[id number] DOB: 10-12-1950 DOA: 05/01/2018 PCP: Biagio Borg, MD  HPI/Recap of past 24 hours: Gloria [name] is a 67 y.o. female with medical history significant for chronic diastolic CHF, hypertension, asthma not treated, OSA noncompliant with CPAP, type 2 diabetes, polyneuropathy, hyperlipidemia, who presented to ED [facility] brought in by her sister and brother-in-law due to gradually worsening shortness of breath of 2 months duration.  Associated with increasing abdominal girth and weight loss more than 20 pounds in the last 2 months.  Did not seek help for this but states that she has a PCP that she sees once a year.  Had paracentesis by IR on the day of admission 05/01/18 with 6L of fluid removed.  Abdomen ultrasound revealed cirrhosis of the liver.  Negative hepatitis panel, nonalcoholic, negative HIV screening.  Hospital course complicated by newly diagnosed systolic CHF with LVEF 49% and intermittent chest pain. L and R heart cath done on 05/05/2018. Heart failure team consulted and following.  Suspicion for viral myocarditis.  Continues to diurese well.  We will hold off paracentesis for now.  05/08/2018: Patient seen and examined at her bedside.  No acute events overnight.  She has no new complaints.  Noted on exam increasing abdominal girth.  GI consulted to further assess her newly diagnosed cirrhosis.  Possible paracentesis today 05/08/2018.  Assessment/Plan: Principal Problem:   Acute systolic (congestive) heart failure (HCC) Active Problems:   Hyperlipidemia   Essential hypertension   Uncontrolled type 2 diabetes mellitus with diabetic neuropathy, without long-term current use of insulin (HCC)   Morbid obesity (HCC)   OSA (obstructive sleep apnea)   Ascites of liver   Anasarca   Cirrhosis of liver with ascites (HCC)  Acute on chronic combined systolic and diastolic CHF 2D echo done on 03/21/2016 revealed preserved LVEF 50 to 55% with grade 1 diastolic  dysfunction Repeated 2D echo done on 70/08/6376 revealed systolic CHF with LVEF 20 to 25% and grade 3 diastolic dysfunction. Cardiology consulted to further assess and to manage newly diagnosed systolic CHF. Heart failure team suspect viral myocarditis Continue Coreg 3.125 mg twice daily, Lasix IV 80 mg twice daily, Spironolactone 12.5 mg daily Newly started Entresto 24-26 mg twice daily by heart failure team Heart failure team following.  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 to RV failure Findings per abdomen ultrasound, cirrhosis of the liver Negative hepatitis panel and negative HIV screening 6 L of fluid removed by interventional radiologist on 05/01/2018 On lactulose and Lasix Will need follow-up with GI Diuresing well Possible paracentesis by IR today 05/08/2018  Hypoglycemia Serum glucose 69 Not on antidiabetic medications Most likely due to liver failure CBG every 6 hours  Resolved hypokalemia post repletion  Hyperammonemia Continue lactulose 30 g daily Repeat ammonia level tomorrow 05/09/2018  Chronic normocytic anemia Stable with no sign of overt bleeding  Generalized weakness/physical debility/ambulatory dysfunction PT assessed and recommended home health PT Fall precautions Continue oral supplement   Code Status: Full code  Family Communication: Updated her son on the phone on 05/08/2018.  Disposition Plan: Home when hemodynamically stable or when cardiology and heart failure team sign off.   Consultants:  Interventional radiology  Cardiology  Heart failure team  Procedures:  Paracentesis by interventional radiology  Antimicrobials:  None  DVT prophylaxis: Subcu Lovenox  daily   Objective: Vitals:   05/07/18 1605 05/07/18 1955 05/07/18 2121 05/08/18 0454  BP: 116/69 103/60 (!) 94/45 110/63  Pulse: 78 74 74 77  Resp:  20  20  Temp:  98.4 F (36.9 C)  98.5 F (36.9 C)  TempSrc:  Oral  Oral  SpO2:  97%  95%  Weight:    73.1 kg  Height:        Intake/Output Summary (Last 24 hours) at 05/08/2018 1433 Last data filed at 05/08/2018 1349 Gross per 24 hour  Intake 1550 ml  Output 5300 ml  Net -3750 ml   Filed Weights   05/06/18 0444 05/07/18 0437 05/08/18 0454  Weight: 86.2 kg 78.3 kg 73.1 kg    Exam:  . General: 67 y.o. year-old female well-developed well-nourished in no acute distress.  Alert and oriented x3.   . Cardiovascular: Regular rate and rhythm with no rubs or gallops.  No JVD or thyromegaly noted.   Marland Kitchen Respiratory: Clear to auscultation with no wheezes or rales.  Good inspiratory effort. . Abdomen: Soft, nontender moderately distended with normal bowel sounds x4 quadrants.  . Musculoskeletal: Trace edema in lower extremities bilaterally.  2 out of 4 pulses in all 4 extremities. Marland Kitchen Psychiatry: Mood is appropriate for condition and setting   Data Reviewed: CBC: Recent Labs  Lab 05/02/18 0613 05/03/18 0527 05/05/18 0455  WBC 7.3 7.3 8.1  NEUTROABS 6.0 6.0  --   HGB 9.7* 9.9* 10.2*  HCT 30.3* 32.3* 33.3*  MCV 89.6 90.5 91.0  PLT 233 258 099   Basic Metabolic Panel: Recent Labs  Lab 05/02/18 0613 05/03/18 0527 05/05/18 0455 05/06/18 0433 05/07/18 0419 05/08/18 0440  NA 137 136 136 137 136 137  K 3.0* 3.5 3.9 4.2 3.3* 3.9  CL 108 104 102 103 97* 96*  CO2 22 25 26 31  33* 33*  GLUCOSE 141* 122* 94 71 69* 79  BUN <5* <5* <5* <5* 5* 5*  CREATININE 0.88 0.95 0.86 0.82 0.96 1.07*  CALCIUM 8.3* 8.4* 8.7* 8.5* 8.4* 8.6*  MG 1.6* 1.9  --   --   --   --   PHOS  --  3.2  --   --   --   --    GFR: Estimated Creatinine Clearance: 52.2 mL/min (A) (by C-G formula based on SCr of 1.07 mg/dL (H)). Liver Function Tests: No results  for input(s): AST, ALT, ALKPHOS, BILITOT, PROT, ALBUMIN in the last 168 hours. No results for input(s): LIPASE, AMYLASE in the last 168 hours. Recent Labs  Lab 05/01/18 2206  AMMONIA 36*   Coagulation Profile: No results for input(s): INR, PROTIME in the last 168 hours. Cardiac Enzymes: No results for input(s): CKTOTAL, CKMB, CKMBINDEX, TROPONINI in the last 168 hours. BNP (last 3 results) No results for input(s): PROBNP in the last 8760 hours. HbA1C: No results for input(s): HGBA1C in the last 72 hours. CBG: Recent Labs  Lab 05/07/18 1758 05/08/18 0036 05/08/18 0637 05/08/18 0756 05/08/18 1148  GLUCAP 98 107* 83 75 118*   Lipid Profile: No results for input(s): CHOL, HDL, LDLCALC, TRIG, CHOLHDL, LDLDIRECT in the last 72 hours. Thyroid Function Tests: No results for input(s): TSH, T4TOTAL, FREET4, T3FREE, THYROIDAB in the last 72 hours. Anemia Panel: No results for input(s): VITAMINB12, FOLATE, FERRITIN, TIBC, IRON, RETICCTPCT in the last 72 hours. Urine analysis:    Component Value Date/Time   COLORURINE  YELLOW 05/01/2018 1249   APPEARANCEUR CLEAR 05/01/2018 1249   LABSPEC 1.009 05/01/2018 1249   PHURINE 7.0 05/01/2018 1249   GLUCOSEU NEGATIVE 05/01/2018 1249   GLUCOSEU NEGATIVE 02/25/2018 1500   HGBUR NEGATIVE 05/01/2018 [place] 05/01/2018 1249   BILIRUBINUR negatvie 11/04/2016 1312   KETONESUR NEGATIVE 05/01/2018 1249   PROTEINUR 30 (A) 05/01/2018 1249   UROBILINOGEN 0.2 02/25/2018 1500   NITRITE NEGATIVE 05/01/2018 1249   LEUKOCYTESUR NEGATIVE 05/01/2018 1249   Sepsis Labs: @LABRCNTIP (procalcitonin:4,lacticidven:4)  ) Recent Results (from the past 240 hour(s))  Culture, body fluid-bottle     Status: None   Collection Time: 05/01/18  5:23 PM  Result Value Ref Range Status   Specimen Description PERITONEAL  Final   Special Requests NONE  Final   Culture   Final    NO GROWTH 5 DAYS Performed at [facility], [place] [address]  [place], [place] [address]    Report Status 05/06/2018 FINAL  Final  Gram stain     Status: None   Collection Time: 05/01/18  5:23 PM  Result Value Ref Range Status   Specimen Description PERITONEAL  Final   Special Requests NONE  Final   Gram Stain   Final    RARE WBC PRESENT, PREDOMINANTLY PMN NO ORGANISMS SEEN Performed at [facility], [place] [address] [place], [place] [address]    Report Status 05/01/2018 FINAL  Final      Studies: No results found.  Scheduled Meds: . carvedilol  3.125 mg Oral BID WC  . enoxaparin (LOVENOX) injection  40 mg Subcutaneous Q24H  . feeding supplement (GLUCERNA SHAKE)  237 mL Oral TID BM  . furosemide  80 mg Intravenous BID  . gabapentin  300 mg Oral BID  . lactulose  30 g Oral Daily  . multivitamin with minerals  1 tablet Oral Daily  . potassium chloride  40 mEq Oral Daily  . sacubitril-valsartan  1 tablet Oral BID  . sodium chloride flush  3 mL Intravenous Q12H  . [START ON 05/09/2018] spironolactone  25 mg Oral Daily  . [START ON 05/09/2018] torsemide  40 mg Oral Daily    Continuous Infusions: . sodium chloride    . albumin human       LOS: 7 days     Kayleen Memos, MD [facility] Pager [phone]  If 7PM-7AM, please contact night-coverage [email or web address] Password [facility] 05/08/2018, 2:33 PM

## 2018-05-08 NOTE — Progress Notes (Addendum)
Patient ID: Gloria [name], female   DOB: [birth year], 67 y.o.   MRN: [id number]     Advanced Heart Failure Rounding Note  PCP-Cardiologist: Skeet Latch, MD   Subjective:    Negative 5.6 L yesterday and down 11 lbs. Cr stable.   Breathing continues to improved. Denies lightheadedness or dizziness, but only walking around room. Abdomen feeling better.   Cardiac MRI 05/06/18: mild LV dilation with EF 35%, diffuse hypokinesis; mild RV dilation with EF 34%; no delayed enhancement.   Objective:   Weight Range: 73.1 kg Body mass index is 26.02 kg/m.   Vital Signs:   Temp:  [98.4 F (36.9 C)-98.5 F (36.9 C)] 98.5 F (36.9 C) (11/08 0454) Pulse Rate:  [69-85] 77 (11/08 0454) Resp:  [20] 20 (11/08 0454) BP: (94-116)/(45-69) 110/63 (11/08 0454) SpO2:  [95 %-99 %] 95 % (11/08 0454) Weight:  [73.1 kg] 73.1 kg (11/08 0454) Last BM Date: 05/07/18  Weight change: Filed Weights   05/06/18 0444 05/07/18 0437 05/08/18 0454  Weight: 86.2 kg 78.3 kg 73.1 kg    Intake/Output:   Intake/Output Summary (Last 24 hours) at 05/08/2018 0948 Last data filed at 05/08/2018 0900 Gross per 24 hour  Intake 1310 ml  Output 6900 ml  Net -5590 ml      Physical Exam    General:No resp difficulty. HEENT: Normal Neck: Supple. JVP at least 10-12 cm with HJR. Carotids 2+ bilat; no bruits. No thyromegaly or nodule noted. Cor: PMI nondisplaced. RRR, No M/G/R noted Lungs: CTAB, normal effort. Abdomen: Soft, non-tender, non-distended, no HSM. No bruits or masses. +BS  Extremities: No cyanosis, clubbing, or rash. R and LLE no edema.  Neuro: Alert & orientedx3, cranial nerves grossly intact. moves all 4 extremities w/o difficulty. Affect pleasant   Telemetry   NSR, personally reviewed.   Labs    CBC No results for input(s): WBC, NEUTROABS, HGB, HCT, MCV, PLT in the last 72 hours. Basic Metabolic Panel Recent Labs    05/07/18 0419 05/08/18 0440  NA 136 137  K 3.3* 3.9  CL 97* 96*  CO2  33* 33*  GLUCOSE 69* 79  BUN 5* 5*  CREATININE 0.96 1.07*  CALCIUM 8.4* 8.6*   Liver Function Tests No results for input(s): AST, ALT, ALKPHOS, BILITOT, PROT, ALBUMIN in the last 72 hours. No results for input(s): LIPASE, AMYLASE in the last 72 hours. Cardiac Enzymes No results for input(s): CKTOTAL, CKMB, CKMBINDEX, TROPONINI in the last 72 hours.  BNP: BNP (last 3 results) Recent Labs    05/01/18 1247 05/01/18 2206 05/02/18 0614  BNP 1,514.4* 1,504.5* 1,255.5*    ProBNP (last 3 results) No results for input(s): PROBNP in the last 8760 hours.   D-Dimer No results for input(s): DDIMER in the last 72 hours. Hemoglobin A1C No results for input(s): HGBA1C in the last 72 hours. Fasting Lipid Panel No results for input(s): CHOL, HDL, LDLCALC, TRIG, CHOLHDL, LDLDIRECT in the last 72 hours. Thyroid Function Tests No results for input(s): TSH, T4TOTAL, T3FREE, THYROIDAB in the last 72 hours.  Invalid input(s): FREET3  Other results:   Imaging    No results found.   Medications:     Scheduled Medications: . carvedilol  3.125 mg Oral BID WC  . enoxaparin (LOVENOX) injection  40 mg Subcutaneous Q24H  . feeding supplement (GLUCERNA SHAKE)  237 mL Oral TID BM  . furosemide  80 mg Intravenous BID  . gabapentin  300 mg Oral BID  . lactulose  30 g Oral  Daily  . multivitamin with minerals  1 tablet Oral Daily  . potassium chloride  40 mEq Oral Daily  . sacubitril-valsartan  1 tablet Oral BID  . sodium chloride flush  3 mL Intravenous Q12H  . spironolactone  25 mg Oral Daily    Infusions: . sodium chloride    . albumin human      PRN Medications: sodium chloride, acetaminophen, diphenhydrAMINE, labetalol, ondansetron (ZOFRAN) IV, oxyCODONE, sodium chloride flush, triamcinolone cream    Patient Profile   Gloria [name] is a 67 y.o. female w/ hx mildHFrEF, HLD, HTN, DM, depression, who was admittedon11/01/2019withdyspneawho is being seen today for the  evaluation of newly reduced EF, acutesystolic congestive heart failure. She underwent paracentesis with removal of 6 L of fluid.  Assessment/Plan   1. Acute on probably chronic systolic CHF: Nonischemic cardiomyopathy.  Echo this admission with EF 20-25%, mildly decreased RV systolic function, moderate MR.  Cath with no significant coronary disease, markedly elevated right and left heart filling pressures in a restrictive pattern.  Cardiac index preserved.   HIV negative, no ETOH, no family history of CHF.  Cardiac MRI with LV EF 35%, RV EF 34%, and no delayed enhancement.  Most likely cause seems to be viral myocarditis. 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 Volume status remains at least somewhat elevated with JVP 10-12 range.  - Continue 80 mg IV lasix bid today, transition to torsemide 40 mg daily tomorrow.  - Increase spiro to 25 mg daily.   - Continue Coreg 3.125 mg bid.   - Continue Entresto 24/26 mg BID.  2. Cirrhosis: Uncertain etiology.  Admitted with significant ascites.  Hepatitis labs negative.  Possible that she has cirrhosis/ascites related to RV failure. She had paracentesis earlier in stay. Abdomen mildly distended but soft, may be able to avoid repeat paracentesis.  - Continue lasix.  - Increase spiro to 25 mg daily.   Length of Stay: [place], [place]  05/08/2018, 9:48 AM  Advanced Heart Failure Team Pager [phone] (M-F; 7a - 4p)  Please contact [facility] for night-coverage after hours (4p -7a ) and weekends on [email or web address]  Patient seen with PA, agree with the above note.  She remains volume overloaded with JVD.  Creatinine remains stable, BP stable.  - Agree that she needs 1 more day IV diuresis.  - Transition to torsemide 40 mg daily tomorrow.  - Increase spironolactone to 25 mg daily.  - Eventual transition to dapagliflozin or empagliflozin for DM/HF would be reasonable.  - Hopefully  home over weekend with followup arranged in CHF clinic.   Loralie Champagne 05/08/2018 10:39 AM

## 2018-05-09 DIAGNOSIS — R188 Other ascites: Secondary | ICD-10-CM

## 2018-05-09 LAB — BASIC METABOLIC PANEL
Anion gap: 10 (ref 5–15)
BUN: 7 mg/dL — ABNORMAL LOW (ref 8–23)
CO2: 29 mmol/L (ref 22–32)
Calcium: 8.5 mg/dL — ABNORMAL LOW (ref 8.9–10.3)
Chloride: 94 mmol/L — ABNORMAL LOW (ref 98–111)
Creatinine, Ser: 1 mg/dL (ref 0.44–1.00)
GFR calc Af Amer: >60 mL/min (ref >60)
GFR calc non Af Amer: 57 mL/min — ABNORMAL LOW (ref >60)
Glucose, Bld: 94 mg/dL (ref 70–99)
Potassium: 3.7 mmol/L (ref 3.5–5.1)
Sodium: 133 mmol/L — ABNORMAL LOW (ref 135–145)

## 2018-05-09 LAB — GLUCOSE, CAPILLARY
Glucose-Capillary: 125 mg/dL — ABNORMAL HIGH (ref 70–99)
Glucose-Capillary: 157 mg/dL — ABNORMAL HIGH (ref 70–99)
Glucose-Capillary: 95 mg/dL (ref 70–99)
Glucose-Capillary: 98 mg/dL (ref 70–99)

## 2018-05-09 LAB — AMMONIA: Ammonia: 34 umol/L (ref 9–35)

## 2018-05-09 LAB — PROTIME-INR
INR: 1.11
Prothrombin Time: 14.2 seconds (ref 11.4–15.2)

## 2018-05-09 NOTE — Progress Notes (Signed)
Progress Note  Patient Name: Gloria [name] Date of Encounter: 05/09/2018  Primary Cardiologist:   Skeet Latch, MD   Subjective   Status post paracentesis of 4.1 liters.  She feels much better.  She thinks that her breathing is at baseline  Inpatient Medications    Scheduled Meds: . carvedilol  3.125 mg Oral BID WC  . enoxaparin (LOVENOX) injection  40 mg Subcutaneous Q24H  . feeding supplement (ENSURE ENLIVE)  237 mL Oral BID BM  . feeding supplement (GLUCERNA SHAKE)  237 mL Oral TID BM  . gabapentin  300 mg Oral BID  . lactulose  30 g Oral Daily  . multivitamin with minerals  1 tablet Oral Daily  . potassium chloride  40 mEq Oral Daily  . sacubitril-valsartan  1 tablet Oral BID  . sodium chloride flush  3 mL Intravenous Q12H  . spironolactone  25 mg Oral Daily  . torsemide  40 mg Oral Daily   Continuous Infusions: . sodium chloride    . albumin human     PRN Meds: sodium chloride, acetaminophen, diphenhydrAMINE, labetalol, lidocaine, ondansetron (ZOFRAN) IV, oxyCODONE, sodium chloride flush, triamcinolone cream   Vital Signs    Vitals:   05/08/18 1640 05/08/18 2003 05/08/18 2123 05/09/18 0534  BP: (!) 97/58 (!) 92/52 98/60 104/61  Pulse: 76 77 76 75  Resp:  20  20  Temp:  98.9 F (37.2 C)  98.3 F (36.8 C)  TempSrc:  Oral  Oral  SpO2:  93%  96%  Weight:    67 kg  Height:        Intake/Output Summary (Last 24 hours) at 05/09/2018 0918 Last data filed at 05/09/2018 0534 Gross per 24 hour  Intake 720 ml  Output 8100 ml  Net -7380 ml   Filed Weights   05/07/18 0437 05/08/18 0454 05/09/18 0534  Weight: 78.3 kg 73.1 kg 67 kg    Telemetry    NSR - Personally Reviewed  ECG    NA - Personally Reviewed  Physical Exam   GEN: No acute distress.   Neck:   Mild JVD Cardiac: RRR, no murmurs, rubs, or gallops.  Respiratory: Clear  to auscultation bilaterally. GI: Soft, nontender, non-distended  MS:   Mild edema; No deformity. Neuro:  Nonfocal    Psych: Normal affect   Labs    Chemistry Recent Labs  Lab 05/07/18 0419 05/08/18 0440 05/09/18 0504  NA 136 137 133*  K 3.3* 3.9 3.7  CL 97* 96* 94*  CO2 33* 33* 29  GLUCOSE 69* 79 94  BUN 5* 5* 7*  CREATININE 0.96 1.07* 1.00  CALCIUM 8.4* 8.6* 8.5*  GFRNONAA 60* 52* 57*  GFRAA >60 >60 >60  ANIONGAP 6 8 10      Hematology Recent Labs  Lab 05/03/18 0527 05/05/18 0455  WBC 7.3 8.1  RBC 3.57* 3.66*  HGB 9.9* 10.2*  HCT 32.3* 33.3*  MCV 90.5 91.0  MCH 27.7 27.9  MCHC 30.7 30.6  RDW 14.6 14.7  PLT 258 279    Cardiac EnzymesNo results for input(s): TROPONINI in the last 168 hours. No results for input(s): TROPIPOC in the last 168 hours.   BNPNo results for input(s): BNP, PROBNP in the last 168 hours.   DDimer No results for input(s): DDIMER in the last 168 hours.   Radiology    Ir Paracentesis  Result Date: 05/08/2018 INDICATION: Newly diagnosed cirrhosis with recurrent ascites. Request therapeutic paracentesis. EXAM: ULTRASOUND GUIDED RIGHT LOWER QUADRANT PARACENTESIS MEDICATIONS: None.  COMPLICATIONS: None immediate. PROCEDURE: Informed written consent was obtained from the patient after a discussion of the risks, benefits and alternatives to treatment. A timeout was performed prior to the initiation of the procedure. Initial ultrasound scanning demonstrates a large amount of ascites within the right lower abdominal quadrant. The right lower abdomen was prepped and draped in the usual sterile fashion. 1% lidocaine with epinephrine was used for local anesthesia. Following this, a 19 gauge, 7-cm, Yueh catheter was introduced. An ultrasound image was saved for documentation purposes. The paracentesis was performed. The catheter was removed and a dressing was applied. The patient tolerated the procedure well without immediate post procedural complication. FINDINGS: A total of approximately 4.1 L of clear yellow fluid was removed. IMPRESSION: Successful ultrasound-guided  paracentesis yielding 4.1 liters of peritoneal fluid. Read by: Ascencion Dike PA-C Electronically Signed   By: Jacqulynn Cadet M.D.   On: 05/08/2018 16:11    Cardiac Studies   ECHO  Study Conclusions  - Left ventricle: The cavity size was mildly dilated. Wall   thickness was increased in a pattern of mild LVH. Systolic   function was severely reduced. The estimated ejection fraction   was in the range of 20% to 25%. Diffuse hypokinesis. Doppler   parameters are consistent with restrictive left ventricular   relaxation (grade 3 diastolic dysfunction). The E/e&' ratio is   >15, suggesting elevated LV filling pressure. - Mitral valve: Mildly thickened leaflets . There was moderate   regurgitation. - Left atrium: The atrium was mildly dilated. - Right ventricle: The cavity size was mildly dilated. Mildly   reduced systolic dysfunction. - Tricuspid valve: There was moderate regurgitation. - Pulmonic valve: There was mild regurgitation. - Pulmonary arteries: PA peak pressure: 44 mm Hg (S). - Inferior vena cava: The vessel was dilated. The respirophasic   diameter changes were blunted (< 50%), consistent with elevated   central venous pressure. - Pericardium, extracardiac: A trivial pericardial effusion was   identified.   Patient Profile     67 y.o. female w/ hx mildHFrEF, HLD, HTN, DM, depression, who was admittedon11/01/2019withdyspneawho is being seen for the evaluation of newly reduced EF, acutesystolic congestive heart failure. She underwent paracentesis with removal of 6 L of fluid followed by repeat of 4 liters fluid removal.  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     Assessment & Plan    ACUTE SYSTOLIC HF:  Transition to Torsemide 40 mg daily today.  Increased spiro yesterday.  Continue other therapy.   Down 26 liters since admit.    ASCITES:  Status post another paracentesis today.  Likely related to heart failure.     HTN:  This is being managed in the  context of treating his CHF   For questions or updates, please contact [facility] Please consult [email or web address] for contact info under Cardiology/STEMI.   Signed, Minus Breeding, MD  05/09/2018, 9:18 AM

## 2018-05-09 NOTE — Progress Notes (Signed)
PROGRESS NOTE  [name] EXH:[id number] DOB: 1950/12/18 DOA: 05/01/2018 PCP: Biagio Borg, MD  HPI/Recap of past 24 hours: Gloria [name] is a 67 y.o. female with medical history significant for chronic diastolic CHF, hypertension, asthma not treated, OSA noncompliant with CPAP, type 2 diabetes, polyneuropathy, hyperlipidemia, who presented to ED [facility] brought in by her sister and brother-in-law due to gradually worsening shortness of breath of 2 months duration.  Associated with increasing abdominal girth and weight loss more than 20 pounds in the last 2 months.  Did not seek help for this but states that she has a PCP that she sees once a year.  Had paracentesis by IR on the day of admission 05/01/18 with 6L of fluid removed.  Abdomen ultrasound revealed cirrhosis of the liver.  Negative hepatitis panel, nonalcoholic, negative HIV screening.  Hospital course complicated by newly diagnosed systolic CHF with LVEF 38% and intermittent chest pain. L and R heart cath done on 05/05/2018. Heart failure team consulted and following.  Suspicion for viral myocarditis.  Continues to diurese well.  We will hold off paracentesis for now.  05/08/2018: Patient seen and examined at her bedside.  No acute events overnight.  She has no new complaints.  Noted on exam increasing abdominal girth.  GI consulted to further assess her newly diagnosed cirrhosis.  Possible paracentesis today 05/08/2018.  05/09/2018 denies pain, no hypoxia, no edema, abdomen feeling better  she refused coreg this am due to borderline bp, cardiology is following  Assessment/Plan: Principal Problem:   Acute systolic (congestive) heart failure (HCC) Active Problems:   Hyperlipidemia   Essential hypertension   Uncontrolled type 2 diabetes mellitus with diabetic neuropathy, without long-term current use of insulin (HCC)   Morbid obesity (HCC)   OSA (obstructive sleep apnea)   Ascites of liver   Anasarca   Cirrhosis of liver with ascites  (HCC)  Acute on chronic combined systolic and diastolic CHF 2D echo done on 03/21/2016 revealed preserved LVEF 50 to 55% with grade 1 diastolic dysfunction Repeated 2D echo done on 03/31/7509 revealed systolic CHF with LVEF 20 to 25% and grade 3 diastolic dysfunction. Cardiology consulted to further assess and to manage newly diagnosed systolic CHF. Heart failure team suspect viral myocarditis bp borderline, she refused coreg, cardiology is adjusting meds, will follow recommendations   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 to RV failure Findings per abdomen ultrasound, cirrhosis of the liver Negative hepatitis panel and negative HIV screening 6 L of fluid removed by interventional radiologist on 05/01/2018 On lactulose and Lasix Will need follow-up with GI Diuresing well S/p  paracentesis by IR on 05/08/2018 with 4.1liter clear yellow fluids removed.  Hypoglycemia Serum glucose 69 Not on antidiabetic medications Most likely due to liver failure CBG every 6 hours  Resolved hypokalemia post repletion  Hyperammonemia Continue lactulose 30 g daily Repeat ammonia level tomorrow 05/09/2018  Chronic normocytic anemia Stable with no sign of overt bleeding  Generalized weakness/physical debility/ambulatory dysfunction PT assessed and recommended home health PT Fall precautions Continue oral supplement   Code Status: Full code  Family Communication: Updated her son on the phone on 05/08/2018.  Disposition Plan: Home when hemodynamically stable or when cardiology and heart failure team sign off.  Consultants:  Interventional radiology  Cardiology  Heart failure team  GI  Procedures:  Paracentesis  on 11/1 and 11/8  Antimicrobials:  None  DVT prophylaxis: Subcu Lovenox daily   Objective: Vitals:   05/08/18 2003 05/08/18 2123 05/09/18 0534 05/09/18 0930  BP: (!) 92/52 98/60 104/61 (!) 92/53  Pulse: 77 76 75 78  Resp: 20  20   Temp: 98.9 F (37.2 C)  98.3 F (36.8 C)   TempSrc: Oral  Oral   SpO2: 93%  96%   Weight:   67 kg   Height:        Intake/Output Summary (Last 24 hours) at 05/09/2018 1109 Last data filed at 05/09/2018 8786 Gross per 24 hour  Intake 840 ml  Output 8300 ml  Net -7460 ml   Filed Weights   05/07/18 0437 05/08/18 0454 05/09/18 0534  Weight: 78.3 kg 73.1 kg 67 kg    Exam:  . General: 67 y.o. year-old female well-developed well-nourished in no acute distress.  Alert and oriented x3.   . Cardiovascular: Regular rate and rhythm with no rubs or gallops.  No JVD or thyromegaly noted.   Marland Kitchen Respiratory: Clear to auscultation with no wheezes or rales.  Good inspiratory effort. . Abdomen: Soft, nontender, less  distended with normal bowel sounds x4 quadrants.  . Musculoskeletal: Trace edema in lower extremities bilaterally.  2 out of 4 pulses in all 4 extremities. Marland Kitchen Psychiatry: Mood is appropriate for condition and setting   Data Reviewed: CBC: Recent Labs  Lab 05/03/18 0527 05/05/18 0455  WBC 7.3 8.1  NEUTROABS 6.0  --   HGB 9.9* 10.2*  HCT 32.3* 33.3*  MCV 90.5 91.0  PLT 258 767   Basic Metabolic Panel: Recent Labs  Lab 05/03/18 0527 05/05/18 0455 05/06/18 0433 05/07/18 0419 05/08/18 0440 05/09/18 0504  NA 136 136 137 136 137 133*  K 3.5 3.9 4.2 3.3* 3.9 3.7  CL 104 102 103 97* 96* 94*  CO2 25 26 31  33* 33* 29  GLUCOSE 122* 94 71 69* 79 94  BUN <5* <5* <5* 5* 5* 7*  CREATININE 0.95 0.86 0.82 0.96 1.07* 1.00  CALCIUM 8.4* 8.7* 8.5* 8.4* 8.6* 8.5*  MG 1.9  --   --   --   --   --   PHOS 3.2  --   --   --   --   --    GFR: Estimated Creatinine Clearance: 51.1 mL/min (by C-G formula based on SCr of 1 mg/dL). Liver Function  Tests: No results for input(s): AST, ALT, ALKPHOS, BILITOT, PROT, ALBUMIN in the last 168 hours. No results for input(s): LIPASE, AMYLASE in the last 168 hours. Recent Labs  Lab 05/09/18 0504  AMMONIA 34   Coagulation Profile: Recent Labs  Lab 05/09/18 0504  INR 1.11   Cardiac Enzymes: No results for input(s): CKTOTAL, CKMB, CKMBINDEX, TROPONINI in the last 168 hours. BNP (last 3 results) No results for input(s): PROBNP in the last 8760 hours. HbA1C: No results for input(s): HGBA1C in the last 72 hours. CBG: Recent Labs  Lab 05/08/18 0637 05/08/18 0756 05/08/18 1148 05/09/18 0002 05/09/18 0653  GLUCAP 83 75 118* 125* 95   Lipid Profile: No results for input(s): CHOL, HDL, LDLCALC, TRIG, CHOLHDL, LDLDIRECT in the last 72 hours. Thyroid Function Tests: No results for input(s): TSH, T4TOTAL, FREET4, T3FREE, THYROIDAB in the last 72 hours. Anemia Panel: No results for input(s): VITAMINB12, FOLATE, FERRITIN, TIBC, IRON, RETICCTPCT in the last 72 hours. Urine  analysis:    Component Value Date/Time   COLORURINE YELLOW 05/01/2018 1249   APPEARANCEUR CLEAR 05/01/2018 1249   LABSPEC 1.009 05/01/2018 1249   PHURINE 7.0 05/01/2018 1249   GLUCOSEU NEGATIVE 05/01/2018 1249   GLUCOSEU NEGATIVE 02/25/2018 1500   HGBUR NEGATIVE 05/01/2018 1249   BILIRUBINUR NEGATIVE 05/01/2018 1249   BILIRUBINUR negatvie 11/04/2016 1312   KETONESUR NEGATIVE 05/01/2018 1249   PROTEINUR 30 (A) 05/01/2018 1249   UROBILINOGEN 0.2 02/25/2018 1500   NITRITE NEGATIVE 05/01/2018 1249   LEUKOCYTESUR NEGATIVE 05/01/2018 1249   Sepsis Labs: @LABRCNTIP (procalcitonin:4,lacticidven:4)  ) Recent Results (from the past 240 hour(s))  Culture, body fluid-bottle     Status: None   Collection Time: 05/01/18  5:23 PM  Result Value Ref Range Status   Specimen Description PERITONEAL  Final   Special Requests NONE  Final   Culture   Final    NO GROWTH 5 DAYS Performed at [facility], [place] [address] [place], [place] [address]    Report Status 05/06/2018 FINAL  Final  Gram stain     Status: None   Collection Time: 05/01/18  5:23 PM  Result Value Ref Range Status   Specimen Description PERITONEAL  Final   Special Requests NONE  Final   Gram Stain   Final    RARE WBC PRESENT, PREDOMINANTLY PMN NO ORGANISMS SEEN Performed at [facility], [place] [address], [place], [place] [address]    Report Status 05/01/2018 FINAL  Final      Studies: Ir Paracentesis  Result Date: 05/08/2018 INDICATION: Newly diagnosed cirrhosis with recurrent ascites. Request therapeutic paracentesis. EXAM: ULTRASOUND GUIDED RIGHT LOWER QUADRANT PARACENTESIS MEDICATIONS: None. COMPLICATIONS: None immediate. PROCEDURE: Informed written consent was obtained from the patient after a discussion of the risks, benefits and alternatives to treatment. A timeout was performed prior to the initiation of the procedure. Initial ultrasound scanning demonstrates a large amount of ascites within the right lower abdominal quadrant. The right lower abdomen was prepped and draped in the usual sterile fashion. 1% lidocaine with epinephrine was used for local anesthesia. Following this, a 19 gauge, 7-cm, Yueh catheter was introduced. An ultrasound image was saved for documentation purposes. The paracentesis was performed. The catheter was removed and a dressing was applied. The patient tolerated the procedure well without immediate post procedural complication. FINDINGS: A total of approximately 4.1 L of clear yellow fluid was removed. IMPRESSION: Successful ultrasound-guided paracentesis yielding 4.1 liters of peritoneal fluid. Read by: Ascencion Dike PA-C Electronically Signed   By: Jacqulynn Cadet M.D.   On: 05/08/2018 16:11    Scheduled Meds: . carvedilol  3.125 mg Oral BID WC  . enoxaparin (LOVENOX) injection  40 mg Subcutaneous Q24H  . feeding supplement (ENSURE ENLIVE)  237 mL Oral BID BM  . feeding supplement (GLUCERNA SHAKE)   237 mL Oral TID BM  . gabapentin  300 mg Oral BID  . lactulose  30 g Oral Daily  . multivitamin with minerals  1 tablet Oral Daily  . potassium chloride  40 mEq Oral Daily  . sacubitril-valsartan  1 tablet Oral BID  . sodium chloride flush  3 mL Intravenous Q12H  . spironolactone  25 mg Oral Daily  . torsemide  40 mg Oral Daily    Continuous Infusions: . sodium chloride    . albumin human       LOS: 8 days     Florencia Reasons, MD PhD [facility] Pager [phone]  If 7PM-7AM, please contact  night-coverage [email or web address] Password [email or web address] 05/09/2018, 11:09 AM

## 2018-05-09 NOTE — Progress Notes (Signed)
Patient resting comfortably during shift report. Denies complaints.  

## 2018-05-09 NOTE — Consult Note (Signed)
[facility] ROUNDING NOTE   Subjective: No acute events overnight.  Therapeutic paracentesis with 4.1 L removed by IR yesterday.  She states she feels improved after paracentesis.  Tolerating p.o. intake this morning and otherwise without any complaints.   Objective: Vital signs in last 24 hours: Temp:  [98.3 F (36.8 C)-98.9 F (37.2 C)] 98.5 F (36.9 C) (11/09 1152) Pulse Rate:  [75-81] 81 (11/09 1152) Resp:  [19-20] 19 (11/09 1152) BP: (92-104)/(45-61) 96/45 (11/09 1152) SpO2:  [93 %-96 %] 95 % (11/09 1152) Weight:  [17 kg] 67 kg (11/09 0534) Last BM Date: 05/08/18 General: NAD Lungs: CTA bilaterally, no wheezes rales or rhonchi Heart: RRR Abdomen: Soft, non-tender, no rebound or guarding, active bowel sounds    Intake/Output from previous day: 11/08 0701 - 11/09 0700 In: 1070 [P.O.:1070] Out: 9200 [Urine:5100] Intake/Output this shift: Total I/O In: 360 [P.O.:360] Out: 800 [Urine:800]   Lab Results: No results for input(s): WBC, HGB, PLT, MCV in the last 72 hours. BMET Recent Labs    05/07/18 0419 05/08/18 0440 05/09/18 0504  NA 136 137 133*  K 3.3* 3.9 3.7  CL 97* 96* 94*  CO2 33* 33* 29  GLUCOSE 69* 79 94  BUN 5* 5* 7*  CREATININE 0.96 1.07* 1.00  CALCIUM 8.4* 8.6* 8.5*   LFT No results for input(s): PROT, ALBUMIN, AST, ALT, ALKPHOS, BILITOT, BILIDIR, IBILI in the last 72 hours. PT/INR Recent Labs    05/09/18 0504  INR 1.11      Imaging/Other results: Ir Paracentesis  Result Date: 05/08/2018 INDICATION: Newly diagnosed cirrhosis with recurrent ascites. Request therapeutic paracentesis. EXAM: ULTRASOUND GUIDED RIGHT LOWER QUADRANT PARACENTESIS MEDICATIONS: None. COMPLICATIONS: None immediate. PROCEDURE: Informed written consent was obtained from the patient after a discussion of the risks, benefits and alternatives to treatment. A timeout was performed prior to the initiation of the procedure. Initial ultrasound scanning  demonstrates a large amount of ascites within the right lower abdominal quadrant. The right lower abdomen was prepped and draped in the usual sterile fashion. 1% lidocaine with epinephrine was used for local anesthesia. Following this, a 19 gauge, 7-cm, Yueh catheter was introduced. An ultrasound image was saved for documentation purposes. The paracentesis was performed. The catheter was removed and a dressing was applied. The patient tolerated the procedure well without immediate post procedural complication. FINDINGS: A total of approximately 4.1 L of clear yellow fluid was removed. IMPRESSION: Successful ultrasound-guided paracentesis yielding 4.1 liters of peritoneal fluid. Read by: Ascencion Dike PA-C Electronically Signed   By: Jacqulynn Cadet M.D.   On: 05/08/2018 16:11      Assessment and Plan  1) New onset ascites: Able to run protein off of prior paracentesis which was > 2.5.  High protein and high SAAG consistent with cardiogenic ascites, which goes along with CT finding of nutmeg liver and otherwise serologic evidence of preserved hepatic synthetic function.  - Agree with Dr. Ardis Hughs that that the imaging studies do NOT support cirrhotic appearing liver. Do not feel that liver bx needed at this time. - Extended serologic evaluation for concomitant liver disease pending.  Otherwise, agree with management of underlying cardiac etiology to primary Medicine and consulting Cardiology services - Given underlying dx, will follow peripherally only at this point. However, please do not hesitate to contact our service with additional questions or concerns    Lavena Bullion, DO  05/09/2018, 2:20 PM [facility] Pager [phone]

## 2018-05-09 NOTE — Progress Notes (Signed)
   Called by RN. Patient had Arlyce Harman increased to 25 QD yesterday. IV diuretics transitioned to po Torsemide (to start today). She is on Coreg and Entresto. BPs running 68-166T systolic.  She is tired but otherwise not dizzy.   Patient refused Coreg this AM due to low BP. PLAN: Hold Torsemide and Spironolactone today. Ok to hold AM [employer]. Plan to give Entresto and Coreg this evening. If BP still running low prior to evening meds, RN to page service back. Richardson Dopp, PA-C    05/09/2018 11:57 AM

## 2018-05-09 NOTE — Progress Notes (Signed)
Per Margaret Pyle with cardiology ok to hold am meds due to low BP, recheck BP this afternoon and consider pm doses of coreg and entresto. Per S.Weaver,PA hold all diuretics today.

## 2018-05-09 NOTE — Progress Notes (Signed)
Page to Cards:  3e16 [place]. BP 101/55 (without am coreg, pt refused d/t low BP) scheduled for Tors40,Aldact25,Entresto. Please advise any adjustments.

## 2018-05-10 LAB — GLUCOSE, CAPILLARY
Glucose-Capillary: 112 mg/dL — ABNORMAL HIGH (ref 70–99)
Glucose-Capillary: 151 mg/dL — ABNORMAL HIGH (ref 70–99)
Glucose-Capillary: 152 mg/dL — ABNORMAL HIGH (ref 70–99)
Glucose-Capillary: 162 mg/dL — ABNORMAL HIGH (ref 70–99)

## 2018-05-10 LAB — BASIC METABOLIC PANEL
Anion gap: 8 (ref 5–15)
BUN: 10 mg/dL (ref 8–23)
CO2: 22 mmol/L (ref 22–32)
Calcium: 8.7 mg/dL — ABNORMAL LOW (ref 8.9–10.3)
Chloride: 101 mmol/L (ref 98–111)
Creatinine, Ser: 0.95 mg/dL (ref 0.44–1.00)
GFR calc Af Amer: >60 mL/min (ref >60)
GFR calc non Af Amer: >60 mL/min (ref >60)
Glucose, Bld: 95 mg/dL (ref 70–99)
Potassium: 4.4 mmol/L (ref 3.5–5.1)
Sodium: 131 mmol/L — ABNORMAL LOW (ref 135–145)

## 2018-05-10 LAB — ANTI-SMOOTH MUSCLE ANTIBODY, IGG: F-Actin IgG: 14 Units (ref 0–19)

## 2018-05-10 LAB — HEPATITIS B SURFACE ANTIGEN: Hepatitis B Surface Ag: NEGATIVE

## 2018-05-10 LAB — MITOCHONDRIAL ANTIBODIES: Mitochondrial M2 Ab, IgG: <20 Units (ref 0.0–20.0)

## 2018-05-10 LAB — CERULOPLASMIN: Ceruloplasmin: 30.2 mg/dL (ref 19.0–39.0)

## 2018-05-10 LAB — HEPATITIS B SURFACE ANTIBODY,QUALITATIVE: Hep B S Ab: NONREACTIVE

## 2018-05-10 LAB — ALPHA-1-ANTITRYPSIN: A-1 Antitrypsin, Ser: 202 mg/dL — ABNORMAL HIGH (ref 101–187)

## 2018-05-10 LAB — HEPATITIS C ANTIBODY: HCV Ab: 0.1 s/co ratio (ref 0.0–0.9)

## 2018-05-10 MED ORDER — CAMPHOR-MENTHOL 0.5-0.5 % EX LOTN
1.0000 "application " | TOPICAL_LOTION | Freq: Two times a day (BID) | CUTANEOUS | Status: DC
  Administered 2018-05-10 – 2018-05-11 (×3): 1 via TOPICAL
  Filled 2018-05-10: qty 222

## 2018-05-10 NOTE — Progress Notes (Signed)
Patient resting comfortably during shift report. Denies complaints.  

## 2018-05-10 NOTE — Progress Notes (Signed)
3E16 Musick BP 90/49, only rec'd coreg earlier today was 104/55. holding until feedback rec'd.      Patient refuses to get out of bed to chair at this time. Pt is agreeable to taking lactulose today, vs refusal yesterday.

## 2018-05-10 NOTE — Progress Notes (Addendum)
PROGRESS NOTE  Gloria [name]:[id number] DOB: May 02, 1951 DOA: 05/01/2018 PCP: Biagio Borg, MD  HPI/Recap of past 24 hours: Gloria [name] is a 67 y.o. female with medical history significant for chronic diastolic CHF, hypertension, asthma not treated, OSA noncompliant with CPAP, type 2 diabetes, polyneuropathy, hyperlipidemia, who presented to ED [facility] brought in by her sister and brother-in-law due to gradually worsening shortness of breath of 2 months duration.  Associated with increasing abdominal girth and weight loss more than 20 pounds in the last 2 months.  Did not seek help for this but states that she has a PCP that she sees once a year.  Had paracentesis by IR on the day of admission 05/01/18 with 6L of fluid removed.  Abdomen ultrasound revealed cirrhosis of the liver.  Negative hepatitis panel, nonalcoholic, negative HIV screening.  Hospital course complicated by newly diagnosed systolic CHF with LVEF 56% and intermittent chest pain. L and R heart cath done on 05/05/2018. Heart failure team consulted and following.  Suspicion for viral myocarditis.  Continues to diurese well.  We will hold off paracentesis for now.  05/08/2018: Patient seen and examined at her bedside.  No acute events overnight.  She has no new complaints.  Noted on exam increasing abdominal girth.  GI consulted to further assess her newly diagnosed cirrhosis.  Possible paracentesis today 05/08/2018.  05/10/2018: Patient seen and examined at bedside.  No acute events overnight.  Intermittent tension noted.  Current medications are being adjusted by cardiology.  Denies chest pain, palpitations or dyspnea at rest.  -28 L since admission.  Assessment/Plan: Principal Problem:   Acute systolic (congestive) heart failure (HCC) Active Problems:   Hyperlipidemia   Essential hypertension   Uncontrolled type 2 diabetes mellitus with diabetic neuropathy, without long-term current use of insulin (HCC)   Morbid obesity (HCC)  OSA (obstructive sleep apnea)   Ascites of liver   Anasarca   Cirrhosis of liver with ascites (HCC)   Other ascites  Acute on chronic combined systolic and diastolic CHF 2D echo done on 03/21/2016 revealed preserved LVEF 50 to 55% with grade 1 diastolic dysfunction Repeated 2D echo done on 38/12/5641 revealed systolic CHF with LVEF 20 to 25% and grade 3 diastolic dysfunction. Cardiology consulted to further assess and to manage newly diagnosed systolic CHF. Heart failure team suspect viral myocarditis Continue Coreg 3.125 mg twice daily, Lasix IV 80 mg twice daily, Spironolactone 12.5 mg daily Entresto 24-26 mg twice daily by heart failure team Heart failure team following. -28 L urine output since admit  Newly diagnosed systolic CHF The etiology of her worsening heart failure is unclear Heart failure team suspects viral myocarditis Continue current medications as stated above Diuresing well  Continue to monitor I's and O's and daily weight  Questionable cirrhosis of the liver with ascites Suspected related to RV failure No clear findings per abdomen ultrasound, CT abdomen pelvis with contrast Negative hepatitis panel and negative HIV screening 6 L of fluid removed by interventional radiologist on 05/01/2018 at 4 L removed on 05/08/2018 On lactulose and Lasix  Hypoglycemia, resolved  Mild hypervolemic hyponatremia Asymptomatic Na+ improving after diuresis  Resolved hypokalemia post repletion  Hyperammonemia Continue lactulose 30 g daily Ammonia 34 from 36 on admission  Chronic normocytic anemia Stable with no sign of overt bleeding  Generalized weakness/physical debility/ambulatory dysfunction PT assessed and recommended home health PT Fall precautions Continue oral supplement Of bed to chair as tolerated Continue physical therapy as tolerated   Code  Status: Full code  Family Communication: Updated her son on the phone on 05/08/2018.  Disposition Plan: Home when  hemodynamically stable or when cardiology and heart failure team sign off.   Consultants:  Interventional radiology  Cardiology  Heart failure team  Procedures:  Paracentesis by interventional radiology  Antimicrobials:  None  DVT prophylaxis: Subcu Lovenox daily   Objective: Vitals:   05/09/18 1933 05/09/18 2139 05/10/18 0444 05/10/18 0854  BP: (!) 105/48 110/68 (!) 100/50 (!) 104/55  Pulse: 85 86 80 80  Resp: 20  20   Temp: 98.3 F (36.8 C)  98.5 F (36.9 C)   TempSrc: Oral  Oral   SpO2: 92% 98% 98%   Weight:      Height:        Intake/Output Summary (Last 24 hours) at 05/10/2018 1129 Last data filed at 05/10/2018 0445 Gross per 24 hour  Intake 963 ml  Output 2275 ml  Net -1312 ml   Filed Weights   05/07/18 0437 05/08/18 0454 05/09/18 0534  Weight: 78.3 kg 73.1 kg 67 kg    Exam:  . General: 67 y.o. year-old female well-developed well-nourished in no acute distress.  Alert and oriented x3.   . Cardiovascular: Regular rate and rhythm with no rubs or gallops.  No JVD or thyromegaly noted.   Marland Kitchen Respiratory: Clear to auscultation with no wheezes or rales.  Good inspiratory effort. . Abdomen: Soft, nontender moderately distended with normal bowel sounds x4 quadrants.  . Musculoskeletal: Trace edema in lower extremities bilaterally.  2 out of 4 pulses in all 4 extremities. Marland Kitchen Psychiatry: Mood is appropriate for condition and setting   Data Reviewed: CBC: Recent Labs  Lab 05/05/18 0455  WBC 8.1  HGB 10.2*  HCT 33.3*  MCV 91.0  PLT 527   Basic Metabolic Panel: Recent Labs  Lab 05/06/18 0433 05/07/18 0419 05/08/18 0440 05/09/18 0504 05/10/18 0523  NA 137 136 137 133* 131*  K 4.2 3.3* 3.9 3.7 4.4  CL 103 97* 96* 94* 101  CO2 31 33* 33* 29 22  GLUCOSE 71 69* 79 94 95  BUN <5* 5* 5* 7* 10  CREATININE 0.82 0.96 1.07* 1.00 0.95  CALCIUM 8.5* 8.4* 8.6* 8.5* 8.7*   GFR: Estimated Creatinine Clearance: 53.8 mL/min (by C-G formula based on SCr of  0.95 mg/dL). Liver Function Tests: No results for input(s): AST, ALT, ALKPHOS, BILITOT, PROT, ALBUMIN in the last 168 hours. No results for input(s): LIPASE, AMYLASE in the last 168 hours. Recent Labs  Lab 05/09/18 0504  AMMONIA 34   Coagulation Profile: Recent Labs  Lab 05/09/18 0504  INR 1.11   Cardiac Enzymes: No results for input(s): CKTOTAL, CKMB, CKMBINDEX, TROPONINI in the last 168 hours. BNP (last 3 results) No results for input(s): PROBNP in the last 8760 hours. HbA1C: No results for input(s): HGBA1C in the last 72 hours. CBG: Recent Labs  Lab 05/09/18 0653 05/09/18 1150 05/09/18 1918 05/10/18 0056 05/10/18 0622  GLUCAP 95 98 157* 151* 112*   Lipid Profile: No results for input(s): CHOL, HDL, LDLCALC, TRIG, CHOLHDL, LDLDIRECT in the last 72 hours. Thyroid Function Tests: No results for input(s): TSH, T4TOTAL, FREET4, T3FREE, THYROIDAB in the last 72 hours. Anemia Panel: No results for input(s): VITAMINB12, FOLATE, FERRITIN, TIBC, IRON, RETICCTPCT in the last 72 hours. Urine analysis:    Component Value Date/Time   COLORURINE YELLOW 05/01/2018 1249   APPEARANCEUR CLEAR 05/01/2018 1249   LABSPEC 1.009 05/01/2018 1249   PHURINE 7.0 05/01/2018 1249  GLUCOSEU NEGATIVE 05/01/2018 1249   GLUCOSEU NEGATIVE 02/25/2018 1500   HGBUR NEGATIVE 05/01/2018 [place] 05/01/2018 1249   BILIRUBINUR negatvie 11/04/2016 1312   KETONESUR NEGATIVE 05/01/2018 1249   PROTEINUR 30 (A) 05/01/2018 1249   UROBILINOGEN 0.2 02/25/2018 1500   NITRITE NEGATIVE 05/01/2018 1249   LEUKOCYTESUR NEGATIVE 05/01/2018 1249   Sepsis Labs: @LABRCNTIP (procalcitonin:4,lacticidven:4)  ) Recent Results (from the past 240 hour(s))  Culture, body fluid-bottle     Status: None   Collection Time: 05/01/18  5:23 PM  Result Value Ref Range Status   Specimen Description PERITONEAL  Final   Special Requests NONE  Final   Culture   Final    NO GROWTH 5 DAYS Performed at [facility], [place] [address], [place], [place] [address]    Report Status 05/06/2018 FINAL  Final  Gram stain     Status: None   Collection Time: 05/01/18  5:23 PM  Result Value Ref Range Status   Specimen Description PERITONEAL  Final   Special Requests NONE  Final   Gram Stain   Final    RARE WBC PRESENT, PREDOMINANTLY PMN NO ORGANISMS SEEN Performed at [facility], [place] [address] [place],  [address]    Report Status 05/01/2018 FINAL  Final      Studies: No results found.  Scheduled Meds: . camphor-menthol  1 application Topical BID  . carvedilol  3.125 mg Oral BID WC  . enoxaparin (LOVENOX) injection  40 mg Subcutaneous Q24H  . feeding supplement (ENSURE ENLIVE)  237 mL Oral BID BM  . feeding supplement (GLUCERNA SHAKE)  237 mL Oral TID BM  . gabapentin  300 mg Oral BID  . lactulose  30 g Oral Daily  . multivitamin with minerals  1 tablet Oral Daily  . potassium chloride  40 mEq Oral Daily  . sacubitril-valsartan  1 tablet Oral BID  . sodium chloride flush  3 mL Intravenous Q12H  . torsemide  40 mg Oral Daily    Continuous Infusions: . sodium chloride    . albumin human       LOS: 9 days     Kayleen Memos, MD [facility] Pager [phone]  If 7PM-7AM, please contact night-coverage [email or web address] Password [email or web address] 05/10/2018, 11:29 AM

## 2018-05-10 NOTE — Progress Notes (Signed)
Patient refused to get to chair when suggested/offered. Pt educated on importance of moving around to maintain her strength. Pt denies willingness to do so at this time.

## 2018-05-10 NOTE — Progress Notes (Signed)
Patient told her son that she has been up ambulating with staff multiple times today which is incorrect.   Pt's son was informed that we are offering to walk pt, and move her to chair for meals etc yet she is refusing.   pts son expresses that he gets we cant help her if she doesn't want to help herself.

## 2018-05-10 NOTE — Progress Notes (Addendum)
Progress Note  Patient Name: Gloria [name] Date of Encounter: 05/10/2018  Primary Cardiologist:   Skeet Latch, MD   Subjective   No complaints.  No chest pain.  No SOB.   Inpatient Medications    Scheduled Meds: . camphor-menthol  1 application Topical BID  . carvedilol  3.125 mg Oral BID WC  . enoxaparin (LOVENOX) injection  40 mg Subcutaneous Q24H  . feeding supplement (ENSURE ENLIVE)  237 mL Oral BID BM  . feeding supplement (GLUCERNA SHAKE)  237 mL Oral TID BM  . gabapentin  300 mg Oral BID  . lactulose  30 g Oral Daily  . multivitamin with minerals  1 tablet Oral Daily  . potassium chloride  40 mEq Oral Daily  . sacubitril-valsartan  1 tablet Oral BID  . sodium chloride flush  3 mL Intravenous Q12H  . spironolactone  25 mg Oral Daily  . torsemide  40 mg Oral Daily   Continuous Infusions: . sodium chloride    . albumin human     PRN Meds: sodium chloride, acetaminophen, diphenhydrAMINE, labetalol, lidocaine, ondansetron (ZOFRAN) IV, oxyCODONE, sodium chloride flush, triamcinolone cream   Vital Signs    Vitals:   05/09/18 1933 05/09/18 2139 05/10/18 0444 05/10/18 0854  BP: (!) 105/48 110/68 (!) 100/50 (!) 104/55  Pulse: 85 86 80 80  Resp: 20  20   Temp: 98.3 F (36.8 C)  98.5 F (36.9 C)   TempSrc: Oral  Oral   SpO2: 92% 98% 98%   Weight:      Height:        Intake/Output Summary (Last 24 hours) at 05/10/2018 1043 Last data filed at 05/10/2018 0445 Gross per 24 hour  Intake 963 ml  Output 2275 ml  Net -1312 ml   Filed Weights   05/07/18 0437 05/08/18 0454 05/09/18 0534  Weight: 78.3 kg 73.1 kg 67 kg    Telemetry    NSR - Personally Reviewed  ECG    NA - Personally Reviewed  Physical Exam   GEN: No  acute distress.   Neck: No  JVD Cardiac: RRR, no murmurs, rubs, or gallops.  Respiratory: Clear  to auscultation bilaterally. GI: Soft, nontender, non-distended, normal bowel sounds  MS:  Trace ankle edema; No deformity. Neuro:    Nonfocal  Psych: Oriented and appropriate    Labs    Chemistry Recent Labs  Lab 05/08/18 0440 05/09/18 0504 05/10/18 0523  NA 137 133* 131*  K 3.9 3.7 4.4  CL 96* 94* 101  CO2 33* 29 22  GLUCOSE 79 94 95  BUN 5* 7* 10  CREATININE 1.07* 1.00 0.95  CALCIUM 8.6* 8.5* 8.7*  GFRNONAA 52* 57* >60  GFRAA >60 >60 >60  ANIONGAP 8 10 8      Hematology Recent Labs  Lab 05/05/18 0455  WBC 8.1  RBC 3.66*  HGB 10.2*  HCT 33.3*  MCV 91.0  MCH 27.9  MCHC 30.6  RDW 14.7  PLT 279    Cardiac EnzymesNo results for input(s): TROPONINI in the last 168 hours. No results for input(s): TROPIPOC in the last 168 hours.   BNPNo results for input(s): BNP, PROBNP in the last 168 hours.   DDimer No results for input(s): DDIMER in the last 168 hours.   Radiology    Ir Paracentesis  Result Date: 05/08/2018 INDICATION: Newly diagnosed cirrhosis with recurrent ascites. Request therapeutic paracentesis. EXAM: ULTRASOUND GUIDED RIGHT LOWER QUADRANT PARACENTESIS MEDICATIONS: None. COMPLICATIONS: None immediate. PROCEDURE: Informed written consent was  obtained from the patient after a discussion of the risks, benefits and alternatives to treatment. A timeout was performed prior to the initiation of the procedure. Initial ultrasound scanning demonstrates a large amount of ascites within the right lower abdominal quadrant. The right lower abdomen was prepped and draped in the usual sterile fashion. 1% lidocaine with epinephrine was used for local anesthesia. Following this, a 19 gauge, 7-cm, Yueh catheter was introduced. An ultrasound image was saved for documentation purposes. The paracentesis was performed. The catheter was removed and a dressing was applied. The patient tolerated the procedure well without immediate post procedural complication. FINDINGS: A total of approximately 4.1 L of clear yellow fluid was removed. IMPRESSION: Successful ultrasound-guided paracentesis yielding 4.1 liters of  peritoneal fluid. Read by: Ascencion Dike PA-C Electronically Signed   By: Jacqulynn Cadet M.D.   On: 05/08/2018 16:11    Cardiac Studies   ECHO  Study Conclusions  - Left ventricle: The cavity size was mildly dilated. Wall   thickness was increased in a pattern of mild LVH. Systolic   function was severely reduced. The estimated ejection fraction   was in the range of 20% to 25%. Diffuse hypokinesis. Doppler   parameters are consistent with restrictive left ventricular   relaxation (grade 3 diastolic dysfunction). The E/e&' ratio is   >15, suggesting elevated LV filling pressure. - Mitral valve: Mildly thickened leaflets . There was moderate   regurgitation. - Left atrium: The atrium was mildly dilated. - Right ventricle: The cavity size was mildly dilated. Mildly   reduced systolic dysfunction. - Tricuspid valve: There was moderate regurgitation. - Pulmonic valve: There was mild regurgitation. - Pulmonary arteries: PA peak pressure: 44 mm Hg (S). - Inferior vena cava: The vessel was dilated. The respirophasic   diameter changes were blunted (< 50%), consistent with elevated   central venous pressure. - Pericardium, extracardiac: A trivial pericardial effusion was   identified.   Patient Profile     67 y.o. female w/ hx mildHFrEF, HLD, HTN, DM, depression, who was admittedon11/01/2019withdyspneawho is being seen for the evaluation of newly reduced EF, acutesystolic congestive heart failure. She underwent paracentesis with removal of 6 L of fluid followed by repeat of 4 liters fluid removal.  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     Assessment & Plan    ACUTE SYSTOLIC HF:  Transitioned to Torsemide 40 mg daily yesterday.   Increased spiro two days ago.  Down 1.4 liters yesterday and down 28 liters since admission. Continue current therapy.  Follow Na which is low.   BP was low and she had the Entresto held yesterday AM .  Looks like she got the PM dose and Coreg  last night and this morning.  I will stop the spironolactone and put a hold order on the Entresto only if the BP less than 90.  She is not dizzy.    ASCITES:  Status post paracentesis x 2 .  Likely related to heart failure.     HTN:  This is being managed in the context of treating his CHF   For questions or updates, please contact [facility] Please consult [email or web address] for contact info under Cardiology/STEMI.   Signed, Minus Breeding, MD  05/10/2018, 10:43 AM

## 2018-05-11 LAB — BASIC METABOLIC PANEL
Anion gap: 7 (ref 5–15)
BUN: 7 mg/dL — ABNORMAL LOW (ref 8–23)
CO2: 26 mmol/L (ref 22–32)
Calcium: 9 mg/dL (ref 8.9–10.3)
Chloride: 101 mmol/L (ref 98–111)
Creatinine, Ser: 0.8 mg/dL (ref 0.44–1.00)
GFR calc Af Amer: >60 mL/min (ref >60)
GFR calc non Af Amer: >60 mL/min (ref >60)
Glucose, Bld: 108 mg/dL — ABNORMAL HIGH (ref 70–99)
Potassium: 3.9 mmol/L (ref 3.5–5.1)
Sodium: 134 mmol/L — ABNORMAL LOW (ref 135–145)

## 2018-05-11 LAB — FANA STAINING PATTERNS: Homogeneous Pattern: 1:1280 {titer}

## 2018-05-11 LAB — GLUCOSE, CAPILLARY
Glucose-Capillary: 103 mg/dL — ABNORMAL HIGH (ref 70–99)
Glucose-Capillary: 104 mg/dL — ABNORMAL HIGH (ref 70–99)
Glucose-Capillary: 130 mg/dL — ABNORMAL HIGH (ref 70–99)

## 2018-05-11 LAB — ANTINUCLEAR ANTIBODIES, IFA: ANA Ab, IFA: POSITIVE — AB

## 2018-05-11 MED ORDER — TORSEMIDE 20 MG PO TABS: 40.0000 mg | ORAL_TABLET | Freq: Every day | ORAL | 0 refills | Status: DC

## 2018-05-11 MED ORDER — POTASSIUM CHLORIDE CRYS ER 20 MEQ PO TBCR: 20.0000 meq | EXTENDED_RELEASE_TABLET | Freq: Every day | ORAL | 0 refills | Status: DC

## 2018-05-11 MED ORDER — SPIRONOLACTONE 25 MG PO TABS: 12.5000 mg | ORAL_TABLET | Freq: Every day | ORAL | 0 refills | Status: DC

## 2018-05-11 MED ORDER — SACUBITRIL-VALSARTAN 24-26 MG PO TABS: 1.0000 | ORAL_TABLET | Freq: Two times a day (BID) | ORAL | 0 refills | Status: DC

## 2018-05-11 MED ORDER — ENSURE ENLIVE PO LIQD: 237.0000 mL | Freq: Two times a day (BID) | ORAL | 0 refills | Status: DC

## 2018-05-11 MED ORDER — SPIRONOLACTONE 12.5 MG HALF TABLET
12.5000 mg | ORAL_TABLET | Freq: Every day | ORAL | Status: DC
  Administered 2018-05-11: 12.5 mg via ORAL
  Filled 2018-05-11: qty 1

## 2018-05-11 MED ORDER — GABAPENTIN 600 MG PO TABS: 300.0000 mg | ORAL_TABLET | Freq: Two times a day (BID) | ORAL | 0 refills | Status: DC

## 2018-05-11 MED ORDER — CARVEDILOL 3.125 MG PO TABS: 3.1250 mg | ORAL_TABLET | Freq: Two times a day (BID) | ORAL | 0 refills | Status: DC

## 2018-05-11 MED ORDER — CAMPHOR-MENTHOL 0.5-0.5 % EX LOTN: 1.0000 "application " | TOPICAL_LOTION | Freq: Two times a day (BID) | CUTANEOUS | 0 refills | Status: AC

## 2018-05-11 NOTE — Discharge Instructions (Signed)
Alpha1-Antitrypsin Test Why am I having this test? This test looks for deficiency of the alpha1-antitrypsin (AAT) protein. Deficiencies of AAT are associated with early onset of a disease that damages the lungs (emphysema). It is also associated with cirrhosis and other liver diseases in children. AAT may be elevated due to inflammation, infection, or certain cancers. This test checks for a variety of findings. Blood levels of AAT may help your health care provider to determine:  If you are deficient in AAT.  If your AAT deficiency is inherited or acquired.  The nature of an inherited AAT deficiency.  Your health care provider may perform this test if you have a family history of emphysema. What kind of sample is taken? A blood sample is required for this test. It is usually collected by inserting a needle into a vein. How do I prepare for this test? There is no preparation required for this test. What are the reference ranges? Reference rangesare considered healthy ranges established after testing a large group of healthy people. Reference rangesmay vary among different people, labs, and hospitals. It is your responsibility to obtain your test results. Ask the lab or department performing the test when and how you will get your results. The reference range for alpha1-antitrypsin is:  85-213 mg/dL or 0.85-2.13 g/L (SI units).  What do the results mean? Generally, increased levels of AAT may indicate:  Inflammation.  Stress.  Infection, such as a thyroid infection.  Decreased levels of AAT may indicate:  Early onset of emphysema.  Cirrhosis in children.  Low blood protein due to malnutrition, cancer, certain syndromes, or liver failure.  This test can produce many different results. Therefore, it is important to talk with your health care provider about your results, treatment options, and if necessary, the need for more tests. Talk with your health care provider if you have  any questions about your results. Talk with your health care provider to discuss your results, treatment options, and if necessary, the need for more tests. Talk with your health care provider if you have any questions about your results. This information is not intended to replace advice given to you by your health care provider. Make sure you discuss any questions you have with your health care provider. Document Released: 07/09/2004 Document Revised: 02/19/2016 Document Reviewed: 12/17/2013 Elsevier Interactive Patient Education  2018 [employer].  Paracentesis Paracentesis is a procedure to remove excess fluid (ascites) from the belly (abdomen). Ascites can result from certain conditions, such as infection, inflammation, abdominal injury, heart failure, chronic scarring of the liver (cirrhosis), or cancer. Ascites is removed using a needle that is inserted through the skin and tissue into the abdomen. This procedure may be done:  To determine the cause of the ascites.  To relieve symptoms that are caused by the ascites, such as pain or shortness of breath.  To see if there is bleeding after an abdominal injury.  Tell a health care provider about:  Any allergies you have.  All medicines you are taking, including vitamins, herbs, eye drops, creams, and over-the-counter medicines.  Any problems you or family members have had with anesthetic medicines.  Any blood disorders you have.  Any surgeries you have had.  Any medical conditions you have.  Whether you are pregnant or may be pregnant. What are the risks? Generally, this is a safe procedure. However, problems may occur, including:  Infection.  Bleeding.  Injury to an abdominal organ, such as the bowel (large intestine), liver, spleen, or  bladder.  Low blood pressure (hypotension).  Spreading of cancer, if there are cancer cells in the abdominal fluid.  Mental status changes in people who have liver disease. These  changes would be caused by shifts in the balance of fluids and minerals (electrolytes) in the body.  What happens before the procedure?  Ask your health care provider about: ? Changing or stopping your regular medicines. This is especially important if you are taking diabetes medicines or blood thinners. ? Taking medicines such as aspirin and ibuprofen. These medicines can thin your blood. Do not take these medicines before your procedure if your health care provider instructs you not to.  A blood sample may be done to determine your blood clotting time.  You will be asked to urinate. What happens during the procedure?  You may be asked to lie on your back with your head raised (elevated).  To reduce your risk of infection: ? Your health care team will wash or sanitize their hands. ? Your skin will be washed with soap.  You will be given a medicine to numb the area (local anesthetic).  Your abdominal skin will be punctured with a needle or a scalpel.  A drainage tube will be inserted through the puncture site. Fluid will drain through the tube into a container.  After enough fluid has been removed, the tube will be removed.  A sample of the fluid will be sent for examination.  A bandage (dressing) will be placed over the puncture site. The procedure may vary among health care providers and hospitals. What happens after the procedure?  It is your responsibility to get your test results. Ask your health care provider or the department performing the test when your results will be ready. This information is not intended to replace advice given to you by your health care provider. Make sure you discuss any questions you have with your health care provider. Document Released: 12/31/2004 Document Revised: 11/23/2015 Document Reviewed: 08/30/2014 Elsevier Interactive Patient Education  2018 [place].  Paracentesis, Care After Refer to this sheet in the next few weeks. These  instructions provide you with information about caring for yourself after your procedure. Your health care provider may also give you more specific instructions. Your treatment has been planned according to current medical practices, but problems sometimes occur. Call your health care provider if you have any problems or questions after your procedure. What can I expect after the procedure? After your procedure, it is common to have a small amount of clear fluid coming from the puncture site. Follow these instructions at home:  Return to your normal activities as told by your health care provider. Ask your health care provider what activities are safe for you.  Take over-the-counter and prescription medicines only as told by your health care provider.  Do not take baths, swim, or use a hot tub until your health care provider approves.  Follow instructions from your health care provider about: ? How to take care of your puncture site. ? When and how you should change your bandage (dressing). ? When you should remove your dressing.  Check your puncture area every day signs of infection. Watch for: ? Redness, swelling, or pain. ? Fluid, blood, or pus.  Keep all follow-up visits as told by your health care provider. This is important. Contact a health care provider if:  You have redness, swelling, or pain at your puncture site.  You start to have more clear fluid coming from your puncture  site.  You have blood or pus coming from your puncture site.  You have chills.  You have a fever. Get help right away if:  You develop chest pain or shortness of breath.  You develop increasing pain, discomfort, or swelling in your abdomen.  You feel dizzy or light-headed or you pass out. This information is not intended to replace advice given to you by your health care provider. Make sure you discuss any questions you have with your health care provider. Document Released: 11/01/2014 Document  Revised: 11/23/2015 Document Reviewed: 08/30/2014 Elsevier Interactive Patient Education  2018 [place].   Heart Failure Eating Plan Heart failure, also called congestive heart failure, occurs when your heart does not pump blood well enough to meet your body's needs for oxygen-rich blood. Heart failure is a long-term (chronic) condition. Living with heart failure can be challenging. However, following your health care provider's instructions about a healthy lifestyle and working with a diet and nutrition specialist (dietitian) to choose the right foods may help to improve your symptoms. What are tips for following this plan? General guidelines  Do not eat more than 2,300 mg of salt (sodium) a day. The amount of sodium that is recommended for you may be lower, depending on your condition.  Maintain a healthy body weight as directed. Ask your health care provider what a healthy weight is for you. ? Check your weight every day. ? Work with your health care provider and dietitian to make a plan that is right for you to lose weight or maintain your current weight.  Limit how much fluid you drink. Ask your health care provider or dietitian how much fluid you can have each day.  Limit or avoid alcohol as told by your health care provider or dietitian. Reading food labels  Check food labels for the amount of sodium per serving. Choose foods that have less than 140 mg (milligrams) of sodium in each serving.  Check food labels for the number of calories per serving. This is important if you need to limit your daily calorie intake to lose weight.  Check food labels for the serving size. If you eat more than one serving, you will be eating more sodium and calories than what is listed on the label.  Look for foods that are labeled as "sodium-free," "very low sodium," or "low sodium." ? Foods labeled as "reduced sodium" or "lightly salted" may still have more sodium than what is recommended for  you. Cooking  Avoid adding salt when cooking. Ask your health care provider or dietitian before using salt substitutes.  Season food with salt-free seasonings, spices, or herbs. Check the label of seasoning mixes to make sure they do not contain salt.  Cook with heart-healthy oils, such as olive, canola, soybean, or sunflower oil.  Do not fry foods. Cook foods using low-fat methods, such as baking, boiling, grilling, and broiling.  Limit unhealthy fats when cooking by: ? Removing the skin from poultry, such as chicken. ? Removing all visible fats from meats. ? Skimming the fat off from stews, soups, and gravies before serving them. Meal planning  Limit your intake of: ? Processed, canned, or pre-packaged foods. ? Foods that are high in trans fat, such as fried foods. ? Sweets, desserts, sugary drinks, and other foods with added sugar. ? Full-fat dairy products, such as whole milk.  Eat a balanced diet that includes: ? 4-5 servings of fruit each day and 4-5 servings of vegetables each day. At each meal,  try to fill half of your plate with fruits and vegetables. ? Up to 6-8 servings of whole grains each day. ? Up to 2 servings of lean meat, poultry, or fish each day. One serving of meat is equal to 3 oz. This is about the same size as a deck of cards. ? 2 servings of low-fat dairy each day. ? Heart-healthy fats. Healthy fats called omega-3 fatty acids are found in foods such as flaxseed and cold-water fish like sardines, salmon, and mackerel.  Aim to eat 25-35 g (grams) of fiber a day. Foods that are high in fiber include apples, broccoli, carrots, beans, peas, and whole grains.  Do not add salt or condiments that contain salt (such as soy sauce) to foods before eating.  When eating at a restaurant, ask that your food be prepared with less salt or no salt, if possible.  Try to eat 2 or more vegetarian meals each week.  Eat more home-cooked food and eat less restaurant, buffet, and  fast food. Recommended foods The items listed may not be a complete list. Talk with your dietitian about what dietary choices are best for you. Grains Bread with less than 80 mg of sodium per slice. Whole-wheat pasta, quinoa, and brown rice. Oats and oatmeal. Barley. [place]. Grits and cream of wheat. Whole-grain and whole-wheat cold cereal. Vegetables All fresh vegetables. Vegetables that are frozen without sauce or added salt. Low-sodium or sodium-free canned vegetables. Fruits All fresh, frozen, and canned fruits. Dried fruits, such as raisins, prunes, and cranberries. Meats and other protein foods Lean cuts of meat. Skinless chicken and [place]. Fish with high omega-3 fatty acids, such as salmon, sardines, and other cold-water fishes. Eggs. Dried beans, peas, and edamame. Unsalted nuts and nut butters. Dairy Low-fat or nonfat (skim) milk and dried milk. Rice milk, soy milk, and almond milk. Low-fat or nonfat yogurt. Small amounts of reduced-sodium block cheese. Low-sodium cottage cheese. Fats and oils Olive, canola, soybean, flaxseed, or sunflower oil. Avocado. Sweets and desserts Apple sauce. Granola bars. Sugar-free pudding and gelatin. Frozen fruit bars. Seasoning and other foods Fresh and dried herbs. Lemon or lime juice. Vinegar. Low-sodium ketchup. Salt-free marinades, salad dressings, sauces, and seasonings. Foods to avoid The items listed may not be a complete list. Talk with your dietitian about what dietary choices are best for you. Grains Bread with more than 80 mg of sodium per slice. Hot or cold cereal with more than 140 mg sodium per serving. Salted pretzels and crackers. Pre-packaged breadcrumbs. Bagels, croissants, and biscuits. Vegetables Canned vegetables. Frozen vegetables with sauce or seasonings. Creamed vegetables. [place] fries. Onion rings. Pickled vegetables and sauerkraut. Fruits Fruits that are dried with sodium-containing preservatives. Meats and other protein  foods Ribs and chicken wings. Bacon, ham, pepperoni, bologna, salami, and packaged luncheon meats. Hot dogs, bratwurst, and sausage. Canned meat. Smoked meat and fish. Salted nuts and seeds. Dairy Whole milk, half-and-half, and cream. Buttermilk. Processed cheese, cheese spreads, and cheese curds. Regular cottage cheese. Feta cheese. Shredded cheese. String cheese. Fats and oils Butter, lard, shortening, ghee, and bacon fat. Canned and packaged gravies. Seasoning and other foods Onion salt, garlic salt, table salt, and sea salt. Marinades. Regular salad dressings. Relishes, pickles, and olives. Meat flavorings and tenderizers, and bouillon cubes. Horseradish, ketchup, and mustard. Worcestershire sauce. Teriyaki sauce, soy sauce (including reduced sodium). Hot sauce and Tabasco sauce. Steak sauce, fish sauce, oyster sauce, and cocktail sauce. Taco seasonings. Barbecue sauce. [place] sauce. Summary  A heart failure eating plan  includes changes that limit your intake of sodium and unhealthy fat, and it may help you lose weight or maintain a healthy weight. Your health care provider may also recommend limiting how much fluid you drink.  Most people with heart failure should eat no more than 2,300 mg of salt (sodium) a day. The amount of sodium that is recommended for you may be lower, depending on your condition.  Contact your health care provider or dietitian before making any major changes to your diet. This information is not intended to replace advice given to you by your health care provider. Make sure you discuss any questions you have with your health care provider. Document Released: 11/01/2016 Document Revised: 11/01/2016 Document Reviewed: 11/01/2016 Elsevier Interactive Patient Education  2018 [place].   Heart Failure and Exercise Heart failure is a condition in which the heart does not fill or pump enough blood and oxygen to support your body and its functions. Heart failure is a  long-term (chronic) condition. Living with heart failure can be challenging. However, following your health care provider's instructions about a healthy lifestyle may help improve your symptoms. This includes choosing the right exercise plan. Doing daily physical activity is important after a diagnosis of heart failure. You may have some activity restrictions, so talk to your health care provider before doing any exercises. What are the benefits of exercise? Exercise may:  Make your heart muscles stronger.  Lower your blood pressure.  Lower your cholesterol.  Help you lose weight.  Help your bones stay strong.  Improve your blood circulation.  Help your body use oxygen better. This relieves symptoms such as fatigue and shortness of breath.  Help your mental health by lowering the risk of depression and other problems.  Improve your quality of life.  Decrease your chance of hospital admission for heart failure.  What is an exercise plan? An exercise plan is a set of specific exercises and training activities. You will work with your health care provider to create the exercise plan that works for you. The plan may include:  Different types of exercises and how to do them.  Cardiac rehabilitation exercises. These are supervised programs that are designed to strengthen your heart.  What are strengthening exercises? Strengthening exercises are a type of physical activity that involves using resistance to improve your muscle strength. Strengthening exercises usually have repetitive motions. These types of exercises can include:  Lifting weights.  Using weight machines.  Using resistance tubes and bands.  Using kettlebells.  Using your body weight, such as doing push-ups or squats.  What are balance exercises? Balance exercises are another type of physical activity. They strengthen the muscles of the back, stomach, and pelvis (core muscles) and improve your balance. They can  also lower your risk of falling. These types of exercises can include:  Standing on one leg.  Walking backward, sideways, and in a straight line.  Standing up after sitting, without using your hands.  Shifting your weight from one leg to the other.  Lifting one leg in front of you.  Doing tai chi. This is a type of exercise that uses slow movements and deep breathing.  How can I increase my flexibility? Having better flexibility can keep you from falling. It can also lengthen your muscles, improve your range of motion, and help your joints. You can increase your flexibility by:  Doing tai chi.  Doing yoga.  Stretching.  How much aerobic exercise should I get? Aerobic exercises strengthen your  breathing and circulation system and increase your body's use of oxygen. Examples of aerobic exercise include biking, walking, running, and swimming. Talk to your health care provider to find out how much aerobic exercise is safe for you.  To do these exercises:  Start exercising slowly, limiting the amount of time at first. You may need to start with 5 minutes of aerobic exercise every day.  Slowly add more minutes until you can safely do at least 30 minutes of exercise at least 4 days a week.  Summary  Daily physical activity is important after a diagnosis of heart failure.  Exercise can make your heart muscles stronger. It also offers other benefits that will improve your health.  Talk to your health care provider before doing any exercises. This information is not intended to replace advice given to you by your health care provider. Make sure you discuss any questions you have with your health care provider. Document Released: 10/29/2016 Document Revised: 10/29/2016 Document Reviewed: 10/29/2016 Elsevier Interactive Patient Education  2018 [place].   Heart Failure Heart failure means your heart has trouble pumping blood. This makes it hard for your body to work well. Heart  failure is usually a long-term (chronic) condition. You must take good care of yourself and follow your doctor's treatment plan. Follow these instructions at home:  Take your heart medicine as told by your doctor. ? Do not stop taking medicine unless your doctor tells you to. ? Do not skip any dose of medicine. ? Refill your medicines before they run out. ? Take other medicines only as told by your doctor or pharmacist.  Stay active if told by your doctor. The elderly and people with severe heart failure should talk with a doctor about physical activity.  Eat heart-healthy foods. Choose foods that are without trans fat and are low in saturated fat, cholesterol, and salt (sodium). This includes fresh or frozen fruits and vegetables, fish, lean meats, fat-free or low-fat dairy foods, whole grains, and high-fiber foods. Lentils and dried peas and beans (legumes) are also good choices.  Limit salt if told by your doctor.  Cook in a healthy way. Roast, grill, broil, bake, poach, steam, or stir-fry foods.  Limit fluids as told by your doctor.  Weigh yourself every morning. Do this after you pee (urinate) and before you eat breakfast. Write down your weight to give to your doctor.  Take your blood pressure and write it down if your doctor tells you to.  Ask your doctor how to check your pulse. Check your pulse as told.  Lose weight if told by your doctor.  Stop smoking or chewing tobacco. Do not use gum or patches that help you quit without your doctor's approval.  Schedule and go to doctor visits as told.  Nonpregnant women should have no more than 1 drink a day. Men should have no more than 2 drinks a day. Talk to your doctor about drinking alcohol.  Stop illegal drug use.  Stay current with shots (immunizations).  Manage your health conditions as told by your doctor.  Learn to manage your stress.  Rest when you are tired.  If it is really hot outside: ? Avoid intense  activities. ? Use air conditioning or fans, or get in a cooler place. ? Avoid caffeine and alcohol. ? Wear loose-fitting, lightweight, and light-colored clothing.  If it is really cold outside: ? Avoid intense activities. ? Layer your clothing. ? Wear mittens or gloves, a hat, and a scarf  when going outside. ? Avoid alcohol.  Learn about heart failure and get support as needed.  Get help to maintain or improve your quality of life and your ability to care for yourself as needed. Contact a doctor if:  You gain weight quickly.  You are more short of breath than usual.  You cannot do your normal activities.  You tire easily.  You cough more than normal, especially with activity.  You have any or more puffiness (swelling) in areas such as your hands, feet, ankles, or belly (abdomen).  You cannot sleep because it is hard to breathe.  You feel like your heart is beating fast (palpitations).  You get dizzy or light-headed when you stand up. Get help right away if:  You have trouble breathing.  There is a change in mental status, such as becoming less alert or not being able to focus.  You have chest pain or discomfort.  You faint. This information is not intended to replace advice given to you by your health care provider. Make sure you discuss any questions you have with your health care provider. Document Released: 03/26/2008 Document Revised: 11/23/2015 Document Reviewed: 08/03/2012 Elsevier Interactive Patient Education  2017 [employer]   Edema Edema is when you have too much fluid in your body or under your skin. Edema may make your legs, feet, and ankles swell up. Swelling is also common in looser tissues, like around your eyes. This is a common condition. It gets more common as you get older. There are many possible causes of edema. Eating too much salt (sodium) and being on your feet or sitting for a long time can cause edema in your legs, feet, and ankles. Hot  weather may make edema worse. Edema is usually painless. Your skin may look swollen or shiny. Follow these instructions at home:  Keep the swollen body part raised (elevated) above the level of your heart when you are sitting or lying down.  Do not sit still or stand for a long time.  Do not wear tight clothes. Do not wear garters on your upper legs.  Exercise your legs. This can help the swelling go down.  Wear elastic bandages or support stockings as told by your doctor.  Eat a low-salt (low-sodium) diet to reduce fluid as told by your doctor.  Depending on the cause of your swelling, you may need to limit how much fluid you drink (fluid restriction).  Take over-the-counter and prescription medicines only as told by your doctor. Contact a doctor if:  Treatment is not working.  You have heart, liver, or kidney disease and have symptoms of edema.  You have sudden and unexplained weight gain. Get help right away if:  You have shortness of breath or chest pain.  You cannot breathe when you lie down.  You have pain, redness, or warmth in the swollen areas.  You have heart, liver, or kidney disease and get edema all of a sudden.  You have a fever and your symptoms get worse all of a sudden. Summary  Edema is when you have too much fluid in your body or under your skin.  Edema may make your legs, feet, and ankles swell up. Swelling is also common in looser tissues, like around your eyes.  Raise (elevate) the swollen body part above the level of your heart when you are sitting or lying down.  Follow your doctor's instructions about diet and how much fluid you can drink (fluid restriction). This information is  not intended to replace advice given to you by your health care provider. Make sure you discuss any questions you have with your health care provider. Document Released: 12/04/2007 Document Revised: 07/05/2016 Document Reviewed: 07/05/2016 Elsevier Interactive Patient  Education  2017 [employer].

## 2018-05-11 NOTE — Progress Notes (Signed)
Discharge instructions given. Pt verbalized understanding and all questions were answered.  

## 2018-05-11 NOTE — Care Management Important Message (Signed)
Important Message  Patient Details  Name: Gloria [name] MRN: [id number] Date of Birth: [birth year]   Medicare Important Message Given:  Yes    Barb Merino Sharron Petruska 05/11/2018, 5:19 PM

## 2018-05-11 NOTE — Progress Notes (Signed)
Advanced Heart Failure Rounding Note  PCP-Cardiologist: Skeet Latch, MD   Subjective:    No complaints this morning.  Has not been out of bed much but denies lightheadedness or dyspnea.  Weight down considerably from admission.   Meds have been held on and off with SBP in 90s-100s.   S/p paracentesis x 2.   Cardiac MRI 05/06/18: mild LV dilation with EF 35%, diffuse hypokinesis; mild RV dilation with EF 34%; no delayed enhancement.   Objective:   Weight Range: 67.1 kg Body mass index is 23.87 kg/m.   Vital Signs:   Temp:  [98.2 F (36.8 C)-98.4 F (36.9 C)] 98.4 F (36.9 C) (11/11 0453) Pulse Rate:  [75-84] 84 (11/11 0453) Resp:  [16-18] 18 (11/11 0453) BP: (96-106)/(55-65) 105/61 (11/11 0453) SpO2:  [95 %-99 %] 99 % (11/11 0453) Weight:  [67.1 kg] 67.1 kg (11/11 0500) Last BM Date: 05/10/18  Weight change: Filed Weights   05/08/18 0454 05/09/18 0534 05/11/18 0500  Weight: 73.1 kg 67 kg 67.1 kg    Intake/Output:   Intake/Output Summary (Last 24 hours) at 05/11/2018 0839 Last data filed at 05/11/2018 0510 Gross per 24 hour  Intake 1560 ml  Output 2000 ml  Net -440 ml      Physical Exam    General: NAD Neck: No JVD, no thyromegaly or thyroid nodule.  Lungs: Clear to auscultation bilaterally with normal respiratory effort. CV: Nondisplaced PMI.  Heart regular S1/S2, no S3/S4, no murmur.  No peripheral edema.   Abdomen: Soft, nontender, no hepatosplenomegaly, no distention.  Skin: Intact without lesions or rashes.  Neurologic: Alert and oriented x 3.  Psych: Normal affect. Extremities: No clubbing or cyanosis.  HEENT: Normal.    Telemetry   NSR, personally reviewed.   Labs    CBC No results for input(s): WBC, NEUTROABS, HGB, HCT, MCV, PLT in the last 72 hours. Basic Metabolic Panel Recent Labs    05/10/18 0523 05/11/18 0738  NA 131* 134*  K 4.4 3.9  CL 101 101  CO2 22 26  GLUCOSE 95 108*  BUN 10 7*  CREATININE 0.95 0.80  CALCIUM  8.7* 9.0   Liver Function Tests No results for input(s): AST, ALT, ALKPHOS, BILITOT, PROT, ALBUMIN in the last 72 hours. No results for input(s): LIPASE, AMYLASE in the last 72 hours. Cardiac Enzymes No results for input(s): CKTOTAL, CKMB, CKMBINDEX, TROPONINI in the last 72 hours.  BNP: BNP (last 3 results) Recent Labs    05/01/18 1247 05/01/18 2206 05/02/18 0614  BNP 1,514.4* 1,504.5* 1,255.5*    ProBNP (last 3 results) No results for input(s): PROBNP in the last 8760 hours.   D-Dimer No results for input(s): DDIMER in the last 72 hours. Hemoglobin A1C No results for input(s): HGBA1C in the last 72 hours. Fasting Lipid Panel No results for input(s): CHOL, HDL, LDLCALC, TRIG, CHOLHDL, LDLDIRECT in the last 72 hours. Thyroid Function Tests No results for input(s): TSH, T4TOTAL, T3FREE, THYROIDAB in the last 72 hours.  Invalid input(s): FREET3  Other results:   Imaging    No results found.   Medications:     Scheduled Medications: . camphor-menthol  1 application Topical BID  . carvedilol  3.125 mg Oral BID WC  . enoxaparin (LOVENOX) injection  40 mg Subcutaneous Q24H  . feeding supplement (ENSURE ENLIVE)  237 mL Oral BID BM  . feeding supplement (GLUCERNA SHAKE)  237 mL Oral TID BM  . gabapentin  300 mg Oral BID  . lactulose  30 g Oral Daily  . multivitamin with minerals  1 tablet Oral Daily  . potassium chloride  40 mEq Oral Daily  . sacubitril-valsartan  1 tablet Oral BID  . sodium chloride flush  3 mL Intravenous Q12H  . spironolactone  12.5 mg Oral Daily  . torsemide  40 mg Oral Daily    Infusions: . sodium chloride    . albumin human      PRN Medications: sodium chloride, acetaminophen, diphenhydrAMINE, labetalol, ondansetron (ZOFRAN) IV, oxyCODONE, sodium chloride flush, triamcinolone cream    Patient Profile   Gloria [name] is a 67 y.o. female w/ hx mildHFrEF, HLD, HTN, DM, depression, who was admittedon11/01/2019withdyspneawho is  being seen today for the evaluation of newly reduced EF, acutesystolic congestive heart failure. She underwent paracentesis with removal of 6 L of fluid.  Assessment/Plan   1. Acute on probably chronic systolic CHF: Nonischemic cardiomyopathy.  Echo this admission with EF 20-25%, mildly decreased RV systolic function, moderate MR.  Cath with no significant coronary disease, markedly elevated right and left heart filling pressures in a restrictive pattern.  Cardiac index preserved.   HIV negative, no ETOH, no family history of CHF.  Cardiac MRI with LV EF 35%, RV EF 34%, and no delayed enhancement.  Most likely cause seems to be viral myocarditis. 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 Volume status is much improved, weight is down.  No dyspnea or lightheadedness.  - Torsemide 40 mg daily (do not hold).  - Spironolactone 12.5 mg daily  - Continue Coreg 3.125 mg bid.   - Continue Entresto 24/26 mg BID.  - Do not hold medications if BP >/= 90. 2. 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  Has had paracentesis x 2 this admission.  Abdomen is soft.   - restart spironolactone 12.5 daily.  3. Diabetes: Would start dapagliflozin/empagliflozin as outpatient.  4. Disposition: Ready to go home from my standpoint.  Will arrange CHF clinic followup.  Cardiac meds for discharge: torsemide 40 mg daily, spironolactone 12.5 daily, Coreg 3.125 mg bid, Entresto 24/26 bid, KCl 20 daily.   Length of Stay: [place], [place]  05/11/2018, 8:39 AM  Advanced Heart Failure Team Pager [phone] (M-F; 7a - 4p)  Please contact [facility] for night-coverage after hours (4p -7a ) and weekends on [email or web address]

## 2018-05-11 NOTE — Discharge Summary (Signed)
Discharge Summary  Gloria [name] IRJ:[id number] DOB: 11/06/1950  PCP: Biagio Borg, MD  Admit date: 05/01/2018 Discharge date: 05/11/2018  Time spent: 35 minutes  Recommendations for Outpatient Follow-up:  1. Follow-up with cardiology 2. Follow-up with heart failure team 3. Take your medications as prescribed 4. Continue physical therapy 5. Fall precautions  Cardiac meds for discharge: torsemide 40 mg daily, spironolactone 12.5 daily, Coreg 3.125 mg bid, Entresto 24/26 bid, KCl 20 daily.   Discharge Diagnoses:  Active Hospital Problems   Diagnosis Date Noted  . Acute systolic (congestive) heart failure ([place]) 05/03/2018  . Other ascites   . Anasarca 05/03/2018  . Cirrhosis of liver with ascites ([place]) 05/03/2018  . Ascites of liver 03/05/2018  . OSA (obstructive sleep apnea) 12/25/2016  . Morbid obesity ([place]) 01/01/2015  . Uncontrolled type 2 diabetes mellitus with diabetic neuropathy, without long-term current use of insulin ([place])   . Hyperlipidemia 06/18/2010  . Essential hypertension 06/14/2010    Resolved Hospital Problems  No resolved problems to display.    Discharge Condition: Stable  Diet recommendation: Heart healthy diet  Vitals:   05/10/18 1959 05/11/18 0453  BP: (!) 100/56 105/61  Pulse: 80 84  Resp: 18 18  Temp: 98.2 F (36.8 C) 98.4 F (36.9 C)  SpO2: 98% 99%    History of present illness:  Gloria Madry Suggsis a 67 y.o.femalewith medical history significant forchronic diastolic CHF, hypertension, asthma not treated, OSA noncompliant with CPAP, diet-controlled type 2 diabetes, polyneuropathy, hyperlipidemia, who presented to ED [facility] brought in by her sister and brother-in-law due to gradually worsening shortness of breath of 2 months duration. Associated with increasing abdominal girth and weight loss more than 20 pounds in the last 2 months.Did not seek help for this.  Hospital course complicated by newly diagnosed systolic CHF with LVEF 30% and  intermittent chest pain. L and R heart cath done on 05/05/2018, no sign of obstructive cardiomyopathy. Heart failure team consulted and followed.  Suspicion for viral myocarditis.   During this hospitalization had 2 paracentesis by IR 05/01/18 with 6L of fluid removed and on 05/08/2018 with 4 L of fluid removed.  No malignant cells identified.  Abdomen ultrasound revealed questionable cirrhosis of the liver in the setting of fluid overload from newly diagnosed systolic CHF with EF of 16% on 2D echo.  Negative hepatitis panel, nonalcoholic, negative HIV screening.  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   05/11/2018: Patient seen and examined at her bedside.  No acute events overnight.  She has no new complaints.  She denies dizziness, chest pain, palpitations, or dyspnea.  On the day of discharge, the patient was hemodynamically stable.  She will need to follow-up with her primary care provider, cardiology, heart failure team posthospitalization.   Hospital Course:  Principal Problem:   Acute systolic (congestive) heart failure (HCC) Active Problems:   Hyperlipidemia   Essential hypertension   Uncontrolled type 2 diabetes mellitus with diabetic neuropathy, without long-term current use of insulin (HCC)   Morbid obesity (HCC)   OSA (obstructive sleep apnea)   Ascites of liver   Anasarca   Cirrhosis of liver with ascites (HCC)   Other ascites  Acute on chronic combined systolic and diastolic CHF 2D echo done on 03/21/2016 revealed preserved LVEF 50 to 55% with grade 1 diastolic dysfunction Repeated 2D echo done on 01/0/9323 revealed systolic CHF with LVEF 20 to 25% and grade 3 diastolic dysfunction. Cardiology consulted to further assess and  to manage newly diagnosed systolic CHF. Heart failure team suspect viral myocarditis Continue Coreg 3.125 mg twice daily, Lasix IV 80 mg twice daily, Spironolactone 12.5 mg daily Entresto 24-26 mg twice daily by heart  failure team Heart failure team following. 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 No clear findings per abdomen ultrasound, CT abdomen pelvis with contrast Negative hepatitis panel and negative HIV screening 6 L of fluid removed by interventional radiologist on 05/01/2018 at 4 L removed on 05/08/2018 On lactulose and Lasix Elevated alpha-1 antitrypsin test  Hypoglycemia, resolved  Resolved hypokalemia post repletion  Hyperammonemia Continue lactulose 30 g daily Ammonia 34 from 36 on admission  Chronic normocytic anemia Stable with no sign of overt bleeding  Generalized weakness/physical debility/ambulatory dysfunction PT assessed and recommended home health PT Fall precautions Continue oral supplement Of bed to chair as tolerated Continue physical therapy as tolerated   Code Status: Full code    Consultants:  Interventional radiology  Cardiology  Heart failure team  Procedures:  Paracentesis by interventional radiology x2  Antimicrobials:  None   Discharge Exam: BP 105/61 (BP Location: Left Arm)   Pulse 84   Temp 98.4 F (36.9 C) (Oral)   Resp 18   Ht 5\' 6"  (1.676 m)   Wt 67.1 kg   SpO2 99%   BMI 23.87 kg/m  . General: 67 y.o. year-old female well developed well nourished in no acute distress.  Alert and oriented x3. . Cardiovascular: Regular rate and rhythm with no rubs or gallops.  No thyromegaly or JVD noted.   Marland Kitchen Respiratory: Clear to auscultation with no wheezes or rales. Good inspiratory effort. . Abdomen: Soft nontender nondistended with normal bowel sounds x4 quadrants. . Musculoskeletal: No lower  extremity edema. 2/4 pulses in all 4 extremities. . Skin: No ulcerative lesions noted or rashes, . Psychiatry: Mood is appropriate for condition and setting  Discharge Instructions You were cared for by a hospitalist during your hospital stay. If you have any questions about your discharge medications or the care you received while you were in the hospital after you are discharged, you can call the unit and asked to speak with the hospitalist on call if the hospitalist that took care of you is not available. Once you are discharged, your primary care physician will handle any further medical issues. Please note that NO REFILLS for any discharge medications will be authorized once you are discharged, as it is imperative that you return to your primary care physician (or establish a relationship with a primary care physician if you do not have one) for your aftercare needs so that they can reassess your need for medications and monitor your lab values.   Allergies as of 05/11/2018      Reactions   Compazine Other (See Comments)   Stroke like symptoms   Haloperidol Decanoate Other (See Comments)   Extrapyramidal syndrome   Penicillins Nausea And Vomiting   Glipizide Other (See Comments)   dyspnea   Metformin And Related Other (See Comments)   GI upset   Codeine Itching   Lisinopril Cough      Medication List    STOP taking these medications   furosemide 20  MG tablet Commonly known as:  LASIX   Insulin Glargine 100 UNIT/ML Solostar Pen Commonly known as:  LANTUS   irbesartan 150 MG tablet Commonly known as:  AVAPRO   irbesartan 300 MG tablet Commonly known as:  AVAPRO   magic mouthwash Soln   metFORMIN 500 MG 24 hr tablet Commonly known as:  GLUCOPHAGE-XR   triamcinolone cream 0.1 % Commonly known as:  KENALOG     TAKE these medications   camphor-menthol lotion Commonly known as:  SARNA Apply 1 application topically 2 (two) times daily.   carvedilol 3.125 MG  tablet Commonly known as:  COREG Take 1 tablet (3.125 mg total) by mouth 2 (two) times daily with a meal.   feeding supplement (ENSURE ENLIVE) Liqd Take 237 mLs by mouth 2 (two) times daily between meals.   gabapentin 600 MG tablet Commonly known as:  NEURONTIN Take 0.5 tablets (300 mg total) by mouth 2 (two) times daily.   glucose blood test strip Use as instructed four times per day   Insulin Pen Needle 32G X 4 MM Misc USE ONCE DAILY   potassium chloride SA 20 MEQ tablet Commonly known as:  K-DUR,KLOR-CON Take 1 tablet (20 mEq total) by mouth daily. Start taking on:  05/12/2018 What changed:    medication strength  how much to take   sacubitril-valsartan 24-26 MG Commonly known as:  ENTRESTO Take 1 tablet by mouth 2 (two) times daily.   spironolactone 25 MG tablet Commonly known as:  ALDACTONE Take 0.5 tablets (12.5 mg total) by mouth daily. Start taking on:  05/12/2018   torsemide 20 MG tablet Commonly known as:  DEMADEX Take 2 tablets (40 mg total) by mouth daily. Start taking on:  05/12/2018            Durable Medical Equipment  (From admission, onward)         Start     Ordered   05/03/18 1551  For home use only DME Walker rolling  Once    Question:  Patient needs a walker to treat with the following condition  Answer:  Ambulatory dysfunction   05/03/18 1551         Allergies  Allergen Reactions  . Compazine Other (See Comments)    Stroke like symptoms  . Haloperidol Decanoate Other (See Comments)    Extrapyramidal syndrome  . Penicillins Nausea And Vomiting  . Glipizide Other (See Comments)    dyspnea  . Metformin And Related Other (See Comments)    GI upset  . Codeine Itching  . Lisinopril Cough   Follow-up Information    [facility]. Go on 05/18/2018.   Specialty:  Cardiology Why:  at 2:30 pm in the [facility]. Please bring all medications to appt.  Gate code is 1800 for  Nov. Contact information: [address] [id number] [place]       Biagio Borg, MD. Call in 1 day(s).   Specialties:  Internal Medicine, Radiology Why:  Please call for a post hospital follow-up appointment. Contact information: [place] [address] [phone]        Skeet Latch, MD .   Specialty:  Cardiology Contact information: [address] [place] [address] [phone]            The results of significant diagnostics from this hospitalization (including imaging, microbiology, ancillary and laboratory) are listed below for reference.  Significant Diagnostic Studies: Dg Chest 2 View  Result Date: 05/01/2018 CLINICAL DATA:  Shortness of breath for 1 month EXAM: CHEST - 2 VIEW COMPARISON:  02/25/2018 FINDINGS: Cardiac shadow is enlarged. The overall inspiratory effort is poor without focal infiltrate or sizable effusion. Degenerative changes of thoracic spine are noted. IMPRESSION: Poor inspiratory effort.  No acute abnormality seen. Electronically Signed   By: Inez Catalina M.D.   On: 05/01/2018 14:41   Ct Angio Chest Pe W And/or Wo Contrast  Result Date: 05/01/2018 CLINICAL DATA:  Abdominal pain and swelling EXAM: CT ANGIOGRAPHY CHEST CT ABDOMEN AND PELVIS WITH CONTRAST TECHNIQUE: Multidetector CT imaging of the chest was performed using the standard protocol during bolus administration of intravenous contrast. Multiplanar CT image reconstructions and MIPs were obtained to evaluate the vascular anatomy. Multidetector CT imaging of the abdomen and pelvis was performed using the standard protocol during bolus administration of intravenous contrast. CONTRAST:  14mL ISOVUE-370 IOPAMIDOL (ISOVUE-370) INJECTION 76% COMPARISON:  None. FINDINGS: CTA CHEST FINDINGS Cardiovascular: There is incomplete coverage of the lower chest such that lower lobe segmental and subsegmental branches  are incompletely covered. Reportedly this coverage is related to patient movement after scout acquisition. No evidence of pulmonary embolism. Cardiomegaly without pericardial effusion. The heart is incompletely covered. Limited aortic opacification. Mediastinum/Nodes: Negative for thoracic adenopathy. There is bilateral axillary adenopathy that is mild and likely related to patient's anasarca. Lungs/Pleura: Most convincing on coronal reformats there is interlobular septal thickening that is mild. No alveolar edema or pneumonia. Musculoskeletal: No acute finding. Review of the MIP images confirms the above findings. CT ABDOMEN and PELVIS FINDINGS Hepatobiliary: Heterogeneous enhancement the liver that resolves on delayed phase, with "nutmeg" appearance, likely passive congestion. There is no definitive changes of cirrhosis. No evidence of biliary obstruction or stone. Pancreas: Unremarkable. Spleen: Unremarkable. Adrenals/Urinary Tract: Negative adrenals. No hydronephrosis or stone. Unremarkable bladder. Stomach/Bowel:  No obstruction. No inflammatory changes. Vascular/Lymphatic: Atherosclerotic calcification. No acute vascular finding. The portal venous system is patent. No mass or adenopathy. Reproductive:Hysterectomy Other: Massive ascites. Mild reticulation of the greater omentum is likely edema. There is no peritoneal nodularity or septation. Musculoskeletal: Degenerative changes without acute or aggressive finding Please note that axial portal venous phase images are only through a partial volume such that some of the abdominal structures have to be reviewed on reformatted images. Image repair is still underway; the study will be signed for expeditious patient care. Review of the MIP images confirms the above findings. IMPRESSION: 1. Massive ascites and generalized anasarca. Heterogeneous liver likely from passive congestion and a cardiac cause is favored. 2. Cardiomegaly. 3. Minimal interstitial pulmonary  edema. 4. Negative for pulmonary embolism. Note that the lower lobe distal segmental and subsegmental vessels are incompletely covered due to patient motion. Electronically Signed   By: Monte Fantasia M.D.   On: 05/01/2018 15:25   US Abdomen Complete  Result Date: 05/01/2018 CLINICAL DATA:  67 y/o  F; cirrhosis with ascites. EXAM: ABDOMEN ULTRASOUND COMPLETE COMPARISON:  05/01/2018 CT abdomen and pelvis. FINDINGS: Gallbladder: Gallbladder sludge and small stones. No gallbladder wall thickening. Negative sonographic Murphy's sign. Common bile duct: Diameter: 3 mm Liver: Diffuse heterogeneity of the liver compatible with cirrhosis. No focal liver abnormality identified. Portal vein is patent on color Doppler imaging with normal direction of blood flow towards the liver. IVC: No abnormality visualized. Pancreas: Visualized portion unremarkable. Spleen: Size and appearance within normal limits. Right Kidney: Length: 10.3 cm. Echogenicity within normal limits. No mass or hydronephrosis visualized. Left  Kidney: Length: 9.5 cm. Echogenicity within normal limits. No mass or hydronephrosis visualized. Abdominal aorta: No aneurysm visualized. Other findings: Perihepatic ascites. IMPRESSION: 1. Gallbladder sludge and small stones. 2. Cirrhotic liver. 3. Perihepatic ascites. 4. No acute process identified. Electronically Signed   By: Kristine Garbe M.D.   On: 05/01/2018 18:18   Ct Abdomen Pelvis W Contrast  Result Date: 05/01/2018 CLINICAL DATA:  Abdominal pain and swelling EXAM: CT ANGIOGRAPHY CHEST CT ABDOMEN AND PELVIS WITH CONTRAST TECHNIQUE: Multidetector CT imaging of the chest was performed using the standard protocol during bolus administration of intravenous contrast. Multiplanar CT image reconstructions and MIPs were obtained to evaluate the vascular anatomy. Multidetector CT imaging of the abdomen and pelvis was performed using the standard protocol during bolus administration of intravenous  contrast. CONTRAST:  125mL ISOVUE-370 IOPAMIDOL (ISOVUE-370) INJECTION 76% COMPARISON:  None. FINDINGS: CTA CHEST FINDINGS Cardiovascular: There is incomplete coverage of the lower chest such that lower lobe segmental and subsegmental branches are incompletely covered. Reportedly this coverage is related to patient movement after scout acquisition. No evidence of pulmonary embolism. Cardiomegaly without pericardial effusion. The heart is incompletely covered. Limited aortic opacification. Mediastinum/Nodes: Negative for thoracic adenopathy. There is bilateral axillary adenopathy that is mild and likely related to patient's anasarca. Lungs/Pleura: Most convincing on coronal reformats there is interlobular septal thickening that is mild. No alveolar edema or pneumonia. Musculoskeletal: No acute finding. Review of the MIP images confirms the above findings. CT ABDOMEN and PELVIS FINDINGS Hepatobiliary: Heterogeneous enhancement the liver that resolves on delayed phase, with "nutmeg" appearance, likely passive congestion. There is no definitive changes of cirrhosis. No evidence of biliary obstruction or stone. Pancreas: Unremarkable. Spleen: Unremarkable. Adrenals/Urinary Tract: Negative adrenals. No hydronephrosis or stone. Unremarkable bladder. Stomach/Bowel:  No obstruction. No inflammatory changes. Vascular/Lymphatic: Atherosclerotic calcification. No acute vascular finding. The portal venous system is patent. No mass or adenopathy. Reproductive:Hysterectomy Other: Massive ascites. Mild reticulation of the greater omentum is likely edema. There is no peritoneal nodularity or septation. Musculoskeletal: Degenerative changes without acute or aggressive finding Please note that axial portal venous phase images are only through a partial volume such that some of the abdominal structures have to be reviewed on reformatted images. Image repair is still underway; the study will be signed for expeditious patient care.  Review of the MIP images confirms the above findings. IMPRESSION: 1. Massive ascites and generalized anasarca. Heterogeneous liver likely from passive congestion and a cardiac cause is favored. 2. Cardiomegaly. 3. Minimal interstitial pulmonary edema. 4. Negative for pulmonary embolism. Note that the lower lobe distal segmental and subsegmental vessels are incompletely covered due to patient motion. Electronically Signed   By: Monte Fantasia M.D.   On: 05/01/2018 15:25   US Paracentesis  Result Date: 05/02/2018 INDICATION: Shortness of breath. Abdominal distention. Ascites. Request for diagnostic and therapeutic paracentesis. EXAM: ULTRASOUND GUIDED LEFT LOWER QUADRANT PARACENTESIS MEDICATIONS: None. COMPLICATIONS: None immediate. PROCEDURE: Informed written consent was obtained from the patient after a discussion of the risks, benefits and alternatives to treatment. A timeout was performed prior to the initiation of the procedure. Initial ultrasound scanning demonstrates a large amount of ascites within the left lower abdominal quadrant. The left lower abdomen was prepped and draped in the usual sterile fashion. 1% lidocaine with epinephrine was used for local anesthesia. Following this, a 19 gauge, 7-cm, Yueh catheter was introduced. An ultrasound image was saved for documentation purposes. The paracentesis was performed. The catheter was removed and a dressing was applied. The patient tolerated the  procedure well without immediate post procedural complication. FINDINGS: A total of approximately 6 L of clear yellow fluid was removed. Samples were sent to the laboratory as requested by the clinical team. IMPRESSION: Successful ultrasound-guided paracentesis yielding 6 liters of peritoneal fluid. Read by: Ascencion Dike PA-C Electronically Signed   By: Corrie Mckusick D.O.   On: 05/01/2018 17:41   Mr Cardiac Morphology W Wo Contrast  Result Date: 05/06/2018 CLINICAL DATA:  Cardiomyopathy of uncertain etiology  EXAM: CARDIAC MRI TECHNIQUE: The patient was scanned on a 1.5 Tesla GE magnet. A dedicated cardiac coil was used. Functional imaging was done using Fiesta sequences. 2,3, and 4 chamber views were done to assess for RWMA's. Modified Simpson's rule using a short axis stack was used to calculate an ejection fraction on a dedicated work Conservation officer, nature. The patient received 9 cc of Gadavist. After 10 minutes inversion recovery sequences were used to assess for infiltration and scar tissue. CONTRAST:  9 cc Gadavist FINDINGS: Limited images of the lung fields showed no gross abnormalities. Ascites was noted. Mildly dilated left ventricle with normal wall thickness. EF 35% with moderate diffuse hypokinesis. Mildly dilated right ventricle with EF 34%. Mild left and right atrial enlargement. Trileaflet aortic valve with no regurgitation or stenosis. At least mild mitral regurgitation visually. Tricuspid regurgitation appears moderate. Delayed enhancement images were difficult, but there did not appear to be any myocardial late gadolinium enhancement (LGE). Measurements: LVEDV 199 mL LVSV 69 mL LVEF 35% RVEDV 211 mL RVSV 72 mL RVEF 34% IMPRESSION: 1.  Mildly dilated LV with EF 35%, diffuse hypokinesis. 2.  Mildly dilated RV with EF 34%, diffuse hypokinesis. 3. No myocardial LGE, so no definitive evidence for prior MI, infiltrative disease, or myocarditis. Dalton Mclean Electronically Signed   By: Loralie Champagne M.D.   On: 05/06/2018 20:47   Ir Paracentesis  Result Date: 05/08/2018 INDICATION: Newly diagnosed cirrhosis with recurrent ascites. Request therapeutic paracentesis. EXAM: ULTRASOUND GUIDED RIGHT LOWER QUADRANT PARACENTESIS MEDICATIONS: None. COMPLICATIONS: None immediate. PROCEDURE: Informed written consent was obtained from the patient after a discussion of the risks, benefits and alternatives to treatment. A timeout was performed prior to the initiation of the procedure. Initial ultrasound  scanning demonstrates a large amount of ascites within the right lower abdominal quadrant. The right lower abdomen was prepped and draped in the usual sterile fashion. 1% lidocaine with epinephrine was used for local anesthesia. Following this, a 19 gauge, 7-cm, Yueh catheter was introduced. An ultrasound image was saved for documentation purposes. The paracentesis was performed. The catheter was removed and a dressing was applied. The patient tolerated the procedure well without immediate post procedural complication. FINDINGS: A total of approximately 4.1 L of clear yellow fluid was removed. IMPRESSION: Successful ultrasound-guided paracentesis yielding 4.1 liters of peritoneal fluid. Read by: Ascencion Dike PA-C Electronically Signed   By: Jacqulynn Cadet M.D.   On: 05/08/2018 16:11    Microbiology: Recent Results (from the past 240 hour(s))  Culture, body fluid-bottle     Status: None   Collection Time: 05/01/18  5:23 PM  Result Value Ref Range Status   Specimen Description PERITONEAL  Final   Special Requests NONE  Final   Culture   Final    NO GROWTH 5 DAYS Performed at [facility], [address], [place], [place] [address]    Report Status 05/06/2018 FINAL  Final  Gram stain     Status: None   Collection Time: 05/01/18  5:23 PM  Result Value Ref Range Status   Specimen Description PERITONEAL  Final   Special Requests NONE  Final   Gram Stain   Final    RARE WBC PRESENT, PREDOMINANTLY PMN NO ORGANISMS SEEN Performed at [facility], [place] [address] [place], [place] [address]    Report Status 05/01/2018 FINAL  Final     Labs: Basic Metabolic Panel: Recent Labs  Lab 05/07/18 0419 05/08/18 0440 05/09/18 0504 05/10/18 0523 05/11/18 0738  NA 136 137 133* 131* 134*  K 3.3* 3.9 3.7 4.4 3.9  CL 97* 96* 94* 101 101  CO2 33* 33* 29 22 26   GLUCOSE 69* 79 94 95 108*  BUN 5* 5* 7* 10 7*  CREATININE 0.96 1.07* 1.00 0.95 0.80  CALCIUM 8.4* 8.6* 8.5* 8.7* 9.0    Liver Function Tests: No results for input(s): AST, ALT, ALKPHOS, BILITOT, PROT, ALBUMIN in the last 168 hours. No results for input(s): LIPASE, AMYLASE in the last 168 hours. Recent Labs  Lab 05/09/18 0504  AMMONIA 34   CBC: Recent Labs  Lab 05/05/18 0455  WBC 8.1  HGB 10.2*  HCT 33.3*  MCV 91.0  PLT 279   Cardiac Enzymes: No results for input(s): CKTOTAL, CKMB, CKMBINDEX, TROPONINI in the last 168 hours. BNP: BNP (last 3 results) Recent Labs    05/01/18 1247 05/01/18 2206 05/02/18 0614  BNP 1,514.4* 1,504.5* 1,255.5*    ProBNP (last 3 results) No results for input(s): PROBNP in the last 8760 hours.  CBG: Recent Labs  Lab 05/10/18 0622 05/10/18 1156 05/10/18 1756 05/11/18 0003 05/11/18 0622  GLUCAP 112* 152* 162* 130* 103*       Signed:  Kayleen Memos, MD [facility] 05/11/2018, 10:55 AM

## 2018-05-12 ENCOUNTER — Other Ambulatory Visit: Payer: Self-pay | Admitting: Internal Medicine

## 2018-05-12 ENCOUNTER — Other Ambulatory Visit: Payer: Self-pay | Admitting: Physician Assistant

## 2018-05-12 ENCOUNTER — Other Ambulatory Visit: Payer: Self-pay | Admitting: Cardiovascular Disease

## 2018-05-12 ENCOUNTER — Telehealth: Payer: Self-pay | Admitting: *Deleted

## 2018-05-12 NOTE — Telephone Encounter (Signed)
Transition Care Management Follow-up Telephone Call   Date discharged? 05/11/18   How have you been since you were released from the hospital? Pt states she is doing ok   Do you understand why you were in the hospital? YES   Do you understand the discharge instructions? YES   Where were you discharged to? Home   Items Reviewed:  Medications reviewed: YES  Allergies reviewed: YES  Dietary changes reviewed: YES  Referrals reviewed: YES, have appt w/cardiology 11/18   Functional Questionnaire:   Activities of Daily Living (ADLs):   She states she are independent in the following: bathing and hygiene, feeding, continence, grooming, toileting and dressing States she require assistance with the following: ambulation   Any transportation issues/concerns?: NO   Any patient concerns? NO   Confirmed importance and date/time of follow-up visits scheduled YES, appt 05/19/18  Provider Appointment booked with Dr. Jenny Reichmann   Confirmed with patient if condition begins to worsen call PCP or go to the ER.  Patient was given the office number and encouraged to call back with question or concerns.  : YES

## 2018-05-12 NOTE — Telephone Encounter (Signed)
Rx request sent to pharmacy.  

## 2018-05-14 ENCOUNTER — Telehealth: Payer: Self-pay | Admitting: Internal Medicine

## 2018-05-14 DIAGNOSIS — G4733 Obstructive sleep apnea (adult) (pediatric): Secondary | ICD-10-CM | POA: Diagnosis not present

## 2018-05-14 DIAGNOSIS — I110 Hypertensive heart disease with heart failure: Secondary | ICD-10-CM | POA: Diagnosis not present

## 2018-05-14 NOTE — Telephone Encounter (Signed)
Noted  

## 2018-05-14 NOTE — Telephone Encounter (Signed)
Copied from [place] [id number]. Topic: Quick Communication - See Telephone Encounter >> May 14, 2018  1:03 PM Vernona Rieger wrote: CRM for notification. See Telephone encounter for: 05/14/18.  Darlina Guys, RN with [facility] called and said that they are going out today to start services. No call back needed.

## 2018-05-15 ENCOUNTER — Telehealth: Payer: Self-pay | Admitting: Internal Medicine

## 2018-05-15 NOTE — Telephone Encounter (Signed)
Ok for verbals if needed

## 2018-05-15 NOTE — Telephone Encounter (Signed)
Notified Debra w/MD response../lmb 

## 2018-05-15 NOTE — Telephone Encounter (Addendum)
Copied from [place] [id number]. Topic: Quick Communication - Home Health Verbal Orders >> May 15, 2018  8:07 AM Lennox Solders wrote: Caller/Agency: debra rn [facility] [phone] Requesting Skilled Nursing Frequency: 1x1 , 2x3, 1x2. Hilda Blades wanted to made dr Jenny Reichmann aware the pt is noncompliance with taking entresto and spironolactone. Pt has an appt with dr Jenny Reichmann on 05-19-18

## 2018-05-18 ENCOUNTER — Encounter (HOSPITAL_COMMUNITY): Payer: Self-pay

## 2018-05-18 ENCOUNTER — Other Ambulatory Visit: Payer: Self-pay

## 2018-05-18 ENCOUNTER — Ambulatory Visit (HOSPITAL_COMMUNITY)
Admission: RE | Admit: 2018-05-18 | Discharge: 2018-05-18 | Disposition: A | Discharge status: Home / Self Care | Payer: Medicare Other | Source: Ambulatory Visit | Attending: Cardiology | Admitting: Cardiology

## 2018-05-18 VITALS — BP 112/62 | HR 86 | Wt 154.4 lb

## 2018-05-18 DIAGNOSIS — Z880 Allergy status to penicillin: Secondary | ICD-10-CM | POA: Diagnosis not present

## 2018-05-18 DIAGNOSIS — I5042 Chronic combined systolic (congestive) and diastolic (congestive) heart failure: Secondary | ICD-10-CM

## 2018-05-18 DIAGNOSIS — R410 Disorientation, unspecified: Secondary | ICD-10-CM | POA: Diagnosis not present

## 2018-05-18 DIAGNOSIS — G4733 Obstructive sleep apnea (adult) (pediatric): Secondary | ICD-10-CM | POA: Insufficient documentation

## 2018-05-18 DIAGNOSIS — I10 Essential (primary) hypertension: Secondary | ICD-10-CM | POA: Diagnosis not present

## 2018-05-18 DIAGNOSIS — I110 Hypertensive heart disease with heart failure: Secondary | ICD-10-CM | POA: Insufficient documentation

## 2018-05-18 DIAGNOSIS — Z833 Family history of diabetes mellitus: Secondary | ICD-10-CM | POA: Diagnosis not present

## 2018-05-18 DIAGNOSIS — F329 Major depressive disorder, single episode, unspecified: Secondary | ICD-10-CM | POA: Diagnosis not present

## 2018-05-18 DIAGNOSIS — Z8249 Family history of ischemic heart disease and other diseases of the circulatory system: Secondary | ICD-10-CM | POA: Insufficient documentation

## 2018-05-18 DIAGNOSIS — Z794 Long term (current) use of insulin: Secondary | ICD-10-CM | POA: Diagnosis not present

## 2018-05-18 DIAGNOSIS — Z885 Allergy status to narcotic agent status: Secondary | ICD-10-CM | POA: Insufficient documentation

## 2018-05-18 DIAGNOSIS — E785 Hyperlipidemia, unspecified: Secondary | ICD-10-CM | POA: Diagnosis not present

## 2018-05-18 DIAGNOSIS — I5020 Unspecified systolic (congestive) heart failure: Secondary | ICD-10-CM | POA: Diagnosis not present

## 2018-05-18 DIAGNOSIS — Z888 Allergy status to other drugs, medicaments and biological substances status: Secondary | ICD-10-CM | POA: Insufficient documentation

## 2018-05-18 DIAGNOSIS — E119 Type 2 diabetes mellitus without complications: Secondary | ICD-10-CM

## 2018-05-18 DIAGNOSIS — I428 Other cardiomyopathies: Secondary | ICD-10-CM | POA: Diagnosis not present

## 2018-05-18 DIAGNOSIS — R188 Other ascites: Secondary | ICD-10-CM | POA: Diagnosis not present

## 2018-05-18 DIAGNOSIS — J45909 Unspecified asthma, uncomplicated: Secondary | ICD-10-CM | POA: Insufficient documentation

## 2018-05-18 LAB — BASIC METABOLIC PANEL
Anion gap: 8 (ref 5–15)
BUN: 14 mg/dL (ref 8–23)
CO2: 28 mmol/L (ref 22–32)
Calcium: 9.6 mg/dL (ref 8.9–10.3)
Chloride: 100 mmol/L (ref 98–111)
Creatinine, Ser: 1.19 mg/dL — ABNORMAL HIGH (ref 0.44–1.00)
GFR calc Af Amer: 54 mL/min — ABNORMAL LOW (ref >60)
GFR calc non Af Amer: 46 mL/min — ABNORMAL LOW (ref >60)
Glucose, Bld: 123 mg/dL — ABNORMAL HIGH (ref 70–99)
Potassium: 4.1 mmol/L (ref 3.5–5.1)
Sodium: 136 mmol/L (ref 135–145)

## 2018-05-18 LAB — MAGNESIUM: Magnesium: 1.7 mg/dL (ref 1.7–2.4)

## 2018-05-18 MED ORDER — SACUBITRIL-VALSARTAN 24-26 MG PO TABS: 1.0000 | ORAL_TABLET | Freq: Two times a day (BID) | ORAL | 11 refills | Status: DC

## 2018-05-18 MED ORDER — POTASSIUM CHLORIDE ER 10 MEQ PO TBCR: 20.0000 meq | EXTENDED_RELEASE_TABLET | Freq: Every day | ORAL | 11 refills | Status: DC

## 2018-05-18 MED ORDER — CARVEDILOL 3.125 MG PO TABS: 3.1250 mg | ORAL_TABLET | Freq: Two times a day (BID) | ORAL | 11 refills | Status: DC

## 2018-05-18 MED ORDER — TORSEMIDE 20 MG PO TABS: 20.0000 mg | ORAL_TABLET | Freq: Every day | ORAL | 11 refills | Status: DC

## 2018-05-18 MED ORDER — SPIRONOLACTONE 25 MG PO TABS: 25.0000 mg | ORAL_TABLET | Freq: Every day | ORAL | 11 refills | Status: DC

## 2018-05-18 NOTE — Progress Notes (Signed)
Advanced Heart Failure Clinic Note   Referring Physician: PCP: Biagio Borg, MD PCP-Cardiologist: Skeet Latch, MD   HPI:  Gloria [name] is a 67 y.o. female with h/o chronic systolic CHF, h/o chronic diastolic CHF (borderline EF in 2017 with LVEF 45% on myoview), HLD, DM2, and depression.   Follows with Dr. Oval Linsey, but last seen in office 05/2017 and was doing well at that time.   Admitted 11/1 - 11/11 with two months of progressive dyspnea and increased abdominal girth. Pertinent labs on admission include BNP 1504, Cr 0.89, K 2.9, Hgb 11.2, WBC 8.0. CXR showed no acute abnormalities with poor respiratory effort. EKG showed sinus tach with nonspecific ST changes. RUQ US showed significant cirrhosis. Underwent abdominal paracentesis with 6L pulled off her her abdomen. CT angio chest negative for PE, but showed massive ascites and anasarca as above. With drop in EF, taken for [facility] 05/05/18. This showed normal coronaries, mod pulmonary hypertension (most likely pulmonary venous hypertension in setting of volume overload), and equalization of end-diastolic pressures (within 4 mmHg) concerning for restrictive hemodynamic physiology. Diuresed and medications optimized as toelrated. She underwent paracentesis x 2. cMRI consistent with NICM, EF 35%.   She presents today for post hospital follow up. She is feeling OK overall. Remains fatigued, but working with HHPT and making improvements.  She has been taking torsemide 1 tab, instead of 2, and spiro whole tab, instead of half. She denies lightheadedness or dizziness. She is doing ADLs again without SOB. Not wearing CPAP. No CP, orthopnea, or PND. Lives at home by herself, but family comes and helps. Hasn't been taking her weight at home.   Echo 05/2018 LVEF 20-25% (Down from 50-55 in 2017), Grade 3 DD, Moderate MR, Mild LAE, Mild RV dilation, mildly reduced RV function, Mod TR, Mild PI, Pa peak pressure 44 mm Hg.   Cardiac MRI 05/06/18: mild LV  dilation with EF 35%, diffuse hypokinesis; mild RV dilation with EF 34%; no delayed enhancement.   Review of systems complete and found to be negative unless listed in HPI.    Social: Lives at home alone. Son lives in [place].   Past Medical History:  Diagnosis Date  . Allergic rhinitis, cause unspecified 03/30/2013  . Anxiety state, unspecified   . Arthritis    "knees" (05/01/2018)  . Asthma    hx (05/01/2018)  . CHF (congestive heart failure) ([place])   . Chronic bronchitis   . Depressive disorder, not elsewhere classified   . Difficulty waking    hard to wake up past sedation!  . Migraine, unspecified, without mention of intractable migraine without mention of status migrainosus   . OSA (obstructive sleep apnea) 12/25/2016  . Other and unspecified hyperlipidemia   . Panic attacks   . Postmenopausal symptoms   . Type 2 diabetes, diet controlled ([place])   . Unspecified essential hypertension   . Unspecified urinary incontinence   . UTI (lower urinary tract infection)    on 02/05/16/on meds    Current Outpatient Medications  Medication Sig Dispense Refill  . carvedilol (COREG) 3.125 MG tablet Take 3.125 mg by mouth 2 (two) times daily with a meal.    . irbesartan (AVAPRO) 150 MG tablet TAKE 1 TABLET BY MOUTH DAILY 90 tablet 0  . potassium chloride SA (K-DUR,KLOR-CON) 20 MEQ tablet Take 1 tablet (20 mEq total) by mouth daily. 30 tablet 0  . sacubitril-valsartan (ENTRESTO) 24-26 MG Take 1 tablet by mouth 2 (two) times daily. 60 tablet 0  .  spironolactone (ALDACTONE) 25 MG tablet Take 0.5 tablets (12.5 mg total) by mouth daily. 30 tablet 0  . torsemide (DEMADEX) 20 MG tablet Take 2 tablets (40 mg total) by mouth daily. 30 tablet 0  . ACCU-CHEK AVIVA PLUS test strip USE AS DIRECTED FOUR TIMES DAILY 100 each 2  . camphor-menthol (SARNA) lotion Apply 1 application topically 2 (two) times daily. 222 mL 0  . carvedilol (COREG) 3.125 MG tablet Take 1 tablet (3.125 mg total) by mouth 2 (two)  times daily with a meal. 60 tablet 0  . feeding supplement, ENSURE ENLIVE, (ENSURE ENLIVE) LIQD Take 237 mLs by mouth 2 (two) times daily between meals. 7 Bottle 0  . gabapentin (NEURONTIN) 600 MG tablet Take 0.5 tablets (300 mg total) by mouth 2 (two) times daily. 30 tablet 0  . glucose blood (ACCU-CHEK AVIVA) test strip Use as instructed four times per day 300 each 11  . Insulin Pen Needle (BD PEN NEEDLE NANO U/F) 32G X 4 MM MISC USE ONCE DAILY 100 each 3  . potassium chloride (K-DUR) 10 MEQ tablet TAKE 1 TABLET(10 MEQ) BY MOUTH DAILY 30 tablet 0   No current facility-administered medications for this encounter.     Allergies  Allergen Reactions  . Compazine Other (See Comments)    Stroke like symptoms  . Haloperidol Decanoate Other (See Comments)    Extrapyramidal syndrome  . Penicillins Nausea And Vomiting  . Glipizide Other (See Comments)    dyspnea  . Metformin And Related Other (See Comments)    GI upset  . Codeine Itching  . Lisinopril Cough      Social History   Socioeconomic History  . Marital status: Divorced    Spouse name: Not on file  . Number of children: 1  . Years of education: Not on file  . Highest education level: Not on file  Occupational History  . Occupation: RETIRED ---Self employed-interior design/flower arrangements  Social Needs  . Financial resource strain: Not on file  . Food insecurity:    Worry: Not on file    Inability: Not on file  . Transportation needs:    Medical: Not on file    Non-medical: Not on file  Tobacco Use  . Smoking status: Never Smoker  . Smokeless tobacco: Never Used  Substance and Sexual Activity  . Alcohol use: Never    Alcohol/week: 0.0 standard drinks    Frequency: Never  . Drug use: Never  . Sexual activity: Not Currently  Lifestyle  . Physical activity:    Days per week: Not on file    Minutes per session: Not on file  . Stress: Not on file  Relationships  . Social connections:    Talks on phone: Not on  file    Gets together: Not on file    Attends religious service: Not on file    Active member of club or organization: Not on file    Attends meetings of clubs or organizations: Not on file    Relationship status: Not on file  . Intimate partner violence:    Fear of current or ex partner: Not on file    Emotionally abused: Not on file    Physically abused: Not on file    Forced sexual activity: Not on file  Other Topics Concern  . Not on file  Social History Narrative   -Retired - does Company secretary   -Divorced in 2007   -One son - lives locally. 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 Bowling. Interior decorating       Family History  Problem Relation Age of Onset  . Arthritis Unknown        family history  . Hypertension Unknown        grandparent  . Colon cancer Maternal Grandfather   . Asthma Son   . Rheum arthritis Mother   . Hypertension Mother   . Diabetes Maternal Uncle   . Diabetes Paternal Uncle   . Diabetes Maternal Grandmother    Vitals:   05/18/18 1430  BP: 112/62  Pulse: 86  SpO2: 98%  Weight: 70 kg (154 lb 6.4 oz)    Wt Readings from Last 3 Encounters:  05/18/18 70 kg (154 lb 6.4 oz)  05/11/18 67.1 kg (147 lb 14.4 oz)  02/25/18 84.4 kg (186 lb)    PHYSICAL EXAM: General:  Elderly appearing. No respiratory difficulty HEENT: normal Neck: supple. no JVD. Carotids 2+ bilat; no bruits. No lymphadenopathy or thyromegaly appreciated. Cor: PMI nondisplaced. Regular rate & rhythm. No rubs, gallops or murmurs. Lungs: clear Abdomen: soft, nontender, nondistended. No hepatosplenomegaly. No bruits or masses. Good bowel sounds. Extremities: no cyanosis, clubbing, rash, edema Neuro: alert & oriented x 3, cranial nerves grossly intact. moves all 4 extremities w/o difficulty. Affect pleasant.  ASSESSMENT & PLAN:  1. Chronic systolic CHF: Nonischemic cardiomyopathy. Echo 05/02/18 with EF 20-25%, mildly  decreased RV systolic function, moderate MR. Cath with no significant coronary disease, markedly elevated right and left heart filling pressures in a restrictive pattern. Cardiac index preserved. HIV negative, no ETOH, no family history of CHF. Cardiac MRI with LV EF 35%, RV EF 34%, and no delayed enhancement.  Most likely cause seems to be viral myocarditis. 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  NYHA III and volume status OK on exam.  - Continue torsemide 20 mg daily for now.  - Continue Spironolactone 25 mg daily  - Continue Coreg 3.125 mg BID - Continue Entresto 24/26 mg BID.  - She has still been taking Irbesartan. Stop.  - Reinforced fluid restriction to < 2 L daily, sodium restriction to less than 2000 mg daily, and the importance of daily weights.   2. 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   - Had had paracentesis x 2 recent admission.  - Continue spironolactone as above. Abdomen is soft.   3. Diabetes:  - Consider dapagliflozin/empagliflozin  Confused about multiple meds and had been taking several differently than prescribed. Reviewed with patient and daughter, and recommended pt use a pill box and let her family help her. RTC 2-3 weeks for Pharm-D visit for med titration with confusion. Labs today.   Shirley Friar, PA-C 05/18/18   Greater than 50% of the 25 minute visit was spent in counseling/coordination of care regarding disease state education, salt/fluid restriction, sliding scale diuretics, and medication compliance.

## 2018-05-18 NOTE — Patient Instructions (Signed)
DECREASE Torsemide to 20 mg, one tab daily INCREASE Spironolactone to 25 mg, one tab daily  Labs today We will only contact you if something comes back abnormal or we need to make some changes. Otherwise no news is good news!   Your physician recommends that you schedule a follow-up appointment in: 2-3 weeks with the CHF pharmacist  Your physician recommends that you schedule a follow-up appointment in: 6 weeks  in the Advanced Practitioners (PA/NP) Clinic    Do the following things EVERYDAY: 1) Weigh yourself in the morning before breakfast. Write it down and keep it in a log. 2) Take your medicines as prescribed 3) Eat low salt foods-Limit salt (sodium) to 2000 mg per day.  4) Stay as active as you can everyday 5) Limit all fluids for the day to less than 2 liters

## 2018-05-19 ENCOUNTER — Ambulatory Visit (INDEPENDENT_AMBULATORY_CARE_PROVIDER_SITE_OTHER): Payer: Medicare Other | Admitting: Internal Medicine

## 2018-05-19 ENCOUNTER — Encounter: Payer: Self-pay | Admitting: Internal Medicine

## 2018-05-19 VITALS — BP 114/66 | HR 80 | Temp 97.6°F | Ht 66.0 in | Wt 154.0 lb

## 2018-05-19 DIAGNOSIS — E1140 Type 2 diabetes mellitus with diabetic neuropathy, unspecified: Secondary | ICD-10-CM | POA: Diagnosis not present

## 2018-05-19 DIAGNOSIS — I10 Essential (primary) hypertension: Secondary | ICD-10-CM | POA: Diagnosis not present

## 2018-05-19 DIAGNOSIS — E1165 Type 2 diabetes mellitus with hyperglycemia: Secondary | ICD-10-CM | POA: Diagnosis not present

## 2018-05-19 DIAGNOSIS — E785 Hyperlipidemia, unspecified: Secondary | ICD-10-CM

## 2018-05-19 DIAGNOSIS — IMO0002 Reserved for concepts with insufficient information to code with codable children: Secondary | ICD-10-CM

## 2018-05-19 NOTE — Assessment & Plan Note (Signed)
stable overall by history and exam, recent data reviewed with pt, and pt to continue medical treatment as before,  to f/u any worsening symptoms or concerns  

## 2018-05-19 NOTE — Progress Notes (Signed)
Subjective:    Patient ID: Gloria [name], female    DOB: 08/26/1950, 67 y.o.   MRN: [id number]  HPI  Here to f/u recent hospn nov 1-11 with CHF, ascites/cirrhosis, OSA, DM, HLD and HTN who was brought in by her sister and brother-in-law due to gradually worsening shortness of breath of 2 months duration. Associated with increasing abdominal girth and weight loss more than 20 pounds in the last 2 months.  Hospital course complicated by newly diagnosed systolic CHF with LVEF 17% and intermittent chest pain. L and R heart cath done on 05/05/2018, no sign of obstructive cardiomyopathy. Heart failure team consulted and followed. Suspicion for viral myocarditis. During this hospitalization had 2 paracentesis by IR 05/01/18 with 6L of fluid removed and on 05/08/2018 with 4 L of fluid removed.  No malignant cells identified. Abdomen ultrasound revealed questionable cirrhosis of the liver in the setting of fluid overload from newly diagnosed systolic CHF with EF of 61% on 2D echo. Negative hepatitis panel, nonalcoholic, negative HIV screening.  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  Seen per cardiology yesterday with decreased torsemide and spironolactone.  To start PT this week.  Does not require DME.   Pt denies fever, wt loss, night sweats, loss of appetite, or other constitutional symptoms.  Pt denies chest pain, increased sob or doe, wheezing, orthopnea, PND, increased LE swelling, palpitations, dizziness or syncope.  Pt denies new neurological symptoms such as new headache, or facial or extremity weakness or numbness.   Pt denies polydipsia, polyuria. Wt Readings from Last 3 Encounters:  05/19/18 154 lb (69.9 kg)  05/18/18 154 lb 6.4 oz (70 kg)  05/11/18 147 lb 14.4 oz (67.1 kg)   BP Readings from Last 3 Encounters:  05/19/18 114/66  05/18/18 112/62  05/11/18 102/60   Past Medical History:  Diagnosis Date  . Allergic rhinitis, cause unspecified 03/30/2013  .  Anxiety state, unspecified   . Arthritis    "knees" (05/01/2018)  . Asthma    hx (05/01/2018)  . CHF (congestive heart failure) ([place])   . Chronic bronchitis   . Depressive disorder, not elsewhere classified   . Difficulty waking    hard to wake up past sedation!  . Migraine, unspecified, without mention of intractable migraine without mention of status migrainosus   . OSA (obstructive sleep apnea) 12/25/2016  . Other and unspecified hyperlipidemia   . Panic attacks   . Postmenopausal symptoms   . Type 2 diabetes, diet controlled ([place])   . Unspecified essential hypertension   . Unspecified urinary incontinence   . UTI (lower urinary tract infection)    on 02/05/16/on meds   Past Surgical History:  Procedure Laterality Date  . ABDOMINAL HYSTERECTOMY  2002  . DIAGNOSTIC LAPAROSCOPY    . IR PARACENTESIS  05/08/2018  . OPEN REDUCTION SHOULDER DISLOCATION Right   . RIGHT/LEFT HEART CATH AND CORONARY ANGIOGRAPHY N/A 05/05/2018   Procedure: RIGHT/LEFT HEART CATH AND CORONARY ANGIOGRAPHY;  Surgeon: Belva Crome, MD;  Location: [facility];  Service: Cardiovascular;  Laterality: N/A;  . TUBAL LIGATION    . TUMOR EXCISION Right    "ankle"    reports that she has never smoked. She has never used smokeless tobacco. She reports that she does not drink alcohol or use drugs. family history includes Arthritis in her unknown relative; Asthma in her son; Colon cancer in her maternal grandfather; Diabetes in her maternal grandmother, maternal uncle, and paternal uncle;  Hypertension in her mother and unknown relative; Rheum arthritis in her mother. Allergies  Allergen Reactions  . Compazine Other (See Comments)    Stroke like symptoms  . Haloperidol Decanoate Other (See Comments)    Extrapyramidal syndrome  . Penicillins Nausea And Vomiting  . Glipizide Other (See Comments)    dyspnea  . Metformin And Related Other (See Comments)    GI upset  . Codeine Itching  . Lisinopril Cough    Current Outpatient Medications on File Prior to Visit  Medication Sig Dispense Refill  . ACCU-CHEK AVIVA PLUS test strip USE AS DIRECTED FOUR TIMES DAILY 100 each 2  . camphor-menthol (SARNA) lotion Apply 1 application topically 2 (two) times daily. 222 mL 0  . carvedilol (COREG) 3.125 MG tablet Take 1 tablet (3.125 mg total) by mouth 2 (two) times daily with a meal. 60 tablet 11  . feeding supplement, ENSURE ENLIVE, (ENSURE ENLIVE) LIQD Take 237 mLs by mouth 2 (two) times daily between meals. 7 Bottle 0  . gabapentin (NEURONTIN) 600 MG tablet Take 0.5 tablets (300 mg total) by mouth 2 (two) times daily. 30 tablet 0  . glucose blood (ACCU-CHEK AVIVA) test strip Use as instructed four times per day 300 each 11  . Insulin Pen Needle (BD PEN NEEDLE NANO U/F) 32G X 4 MM MISC USE ONCE DAILY 100 each 3  . potassium chloride (K-DUR) 10 MEQ tablet Take 2 tablets (20 mEq total) by mouth daily. 60 tablet 11  . sacubitril-valsartan (ENTRESTO) 24-26 MG Take 1 tablet by mouth 2 (two) times daily. 60 tablet 11  . spironolactone (ALDACTONE) 25 MG tablet Take 1 tablet (25 mg total) by mouth daily. 30 tablet 11  . torsemide (DEMADEX) 20 MG tablet Take 1 tablet (20 mg total) by mouth daily. 30 tablet 11   No current facility-administered medications on file prior to visit.    Review of Systems  Constitutional: Negative for other unusual diaphoresis or sweats HENT: Negative for ear discharge or swelling Eyes: Negative for other worsening visual disturbances Respiratory: Negative for stridor or other swelling  Gastrointestinal: Negative for worsening distension or other blood Genitourinary: Negative for retention or other urinary change Musculoskeletal: Negative for other MSK pain or swelling Skin: Negative for color change or other new lesions Neurological: Negative for worsening tremors and other numbness  Psychiatric/Behavioral: Negative for worsening agitation or other fatigue All other system neg per  pt    Objective:   Physical Exam BP 114/66   Pulse 80   Temp 97.6 F (36.4 C) (Oral)   Ht 5\' 6"  (1.676 m)   Wt 154 lb (69.9 kg)   SpO2 98%   BMI 24.86 kg/m  VS noted,  Constitutional: Pt appears in NAD HENT: Head: NCAT.  Right Ear: External ear normal.  Left Ear: External ear normal.  Eyes: . Pupils are equal, round, and reactive to light. Conjunctivae and EOM are normal Nose: without d/c or deformity Neck: Neck supple. Gross normal ROM Cardiovascular: Normal rate and regular rhythm.   Pulmonary/Chest: Effort normal and breath sounds without rales or wheezing.  Abd:  Soft, NT, ND, + BS, no organomegaly Neurological: Pt is alert. At baseline orientation, motor grossly intact Skin: Skin is warm. No rashes, other new lesions, trace bilat pedal edema Psychiatric: Pt behavior is normal without agitation   Lab Results  Component Value Date   WBC 8.1 05/05/2018   HGB 10.2 (L) 05/05/2018   HCT 33.3 (L) 05/05/2018   PLT 279 05/05/2018  GLUCOSE 123 (H) 05/18/2018   CHOL 86 02/25/2018   TRIG 96.0 02/25/2018   HDL 24.00 (L) 02/25/2018   LDLDIRECT 64.0 10/03/2015   LDLCALC 43 02/25/2018   ALT 9 05/01/2018   AST 22 05/01/2018   NA 136 05/18/2018   K 4.1 05/18/2018   CL 100 05/18/2018   CREATININE 1.19 (H) 05/18/2018   BUN 14 05/18/2018   CO2 28 05/18/2018   TSH 4.24 02/25/2018   INR 1.11 05/09/2018   HGBA1C 5.2 05/01/2018   MICROALBUR 6.4 (H) 02/25/2018        Assessment & Plan:

## 2018-05-19 NOTE — Patient Instructions (Addendum)
Please continue all other medications as before, and refills have been done if requested.  Please have the pharmacy call with any other refills you may need.  Please continue your efforts at being more active, low cholesterol diet, and weight control.  You are otherwise up to date with prevention measures today.  Please keep your appointments with your specialists as you may have planned  Please return in 3 months, or sooner if needed, with Lab testing done 3-5 days before  

## 2018-05-20 ENCOUNTER — Telehealth: Payer: Self-pay | Admitting: Internal Medicine

## 2018-05-20 DIAGNOSIS — G4733 Obstructive sleep apnea (adult) (pediatric): Secondary | ICD-10-CM | POA: Diagnosis not present

## 2018-05-20 DIAGNOSIS — I110 Hypertensive heart disease with heart failure: Secondary | ICD-10-CM | POA: Diagnosis not present

## 2018-05-20 NOTE — Telephone Encounter (Signed)
Copied from [place] [id number]. Topic: Quick Communication - See Telephone Encounter >> May 20, 2018  4:01 PM Antonieta Iba C wrote: CRM for notification. See Telephone encounter for: 05/20/18.  Sharyn Lull w/ [facility] is calling to make provider aware that she went out to assess pt today and that she has no more need for PT.    CB: [phone]

## 2018-05-22 DIAGNOSIS — G43909 Migraine, unspecified, not intractable, without status migrainosus: Secondary | ICD-10-CM

## 2018-05-22 DIAGNOSIS — I110 Hypertensive heart disease with heart failure: Secondary | ICD-10-CM

## 2018-05-22 DIAGNOSIS — R32 Unspecified urinary incontinence: Secondary | ICD-10-CM

## 2018-05-22 DIAGNOSIS — I081 Rheumatic disorders of both mitral and tricuspid valves: Secondary | ICD-10-CM

## 2018-05-22 DIAGNOSIS — J45909 Unspecified asthma, uncomplicated: Secondary | ICD-10-CM

## 2018-05-22 DIAGNOSIS — F410 Panic disorder [episodic paroxysmal anxiety] without agoraphobia: Secondary | ICD-10-CM

## 2018-05-22 DIAGNOSIS — I371 Nonrheumatic pulmonary valve insufficiency: Secondary | ICD-10-CM

## 2018-05-22 DIAGNOSIS — G4733 Obstructive sleep apnea (adult) (pediatric): Secondary | ICD-10-CM

## 2018-05-22 DIAGNOSIS — K8020 Calculus of gallbladder without cholecystitis without obstruction: Secondary | ICD-10-CM

## 2018-05-22 DIAGNOSIS — E1142 Type 2 diabetes mellitus with diabetic polyneuropathy: Secondary | ICD-10-CM

## 2018-05-22 DIAGNOSIS — D649 Anemia, unspecified: Secondary | ICD-10-CM

## 2018-05-22 DIAGNOSIS — Z6823 Body mass index (BMI) 23.0-23.9, adult: Secondary | ICD-10-CM

## 2018-05-22 DIAGNOSIS — F329 Major depressive disorder, single episode, unspecified: Secondary | ICD-10-CM

## 2018-05-22 DIAGNOSIS — Z87440 Personal history of urinary (tract) infections: Secondary | ICD-10-CM

## 2018-05-22 DIAGNOSIS — I5082 Biventricular heart failure: Secondary | ICD-10-CM | POA: Diagnosis not present

## 2018-05-22 DIAGNOSIS — I5043 Acute on chronic combined systolic (congestive) and diastolic (congestive) heart failure: Secondary | ICD-10-CM

## 2018-05-22 DIAGNOSIS — Z9181 History of falling: Secondary | ICD-10-CM

## 2018-05-22 DIAGNOSIS — M1990 Unspecified osteoarthritis, unspecified site: Secondary | ICD-10-CM

## 2018-05-22 DIAGNOSIS — I428 Other cardiomyopathies: Secondary | ICD-10-CM | POA: Diagnosis not present

## 2018-05-22 DIAGNOSIS — K7460 Unspecified cirrhosis of liver: Secondary | ICD-10-CM

## 2018-05-26 DIAGNOSIS — G4733 Obstructive sleep apnea (adult) (pediatric): Secondary | ICD-10-CM | POA: Diagnosis not present

## 2018-05-26 DIAGNOSIS — I110 Hypertensive heart disease with heart failure: Secondary | ICD-10-CM | POA: Diagnosis not present

## 2018-06-02 ENCOUNTER — Inpatient Hospital Stay (HOSPITAL_COMMUNITY)
Admission: RE | Admit: 2018-06-02 | Discharge: 2018-06-02 | Disposition: A | Discharge status: Home / Self Care | Payer: Medicare Other | Source: Ambulatory Visit

## 2018-06-03 ENCOUNTER — Encounter: Payer: Self-pay | Admitting: *Deleted

## 2018-06-03 DIAGNOSIS — G4733 Obstructive sleep apnea (adult) (pediatric): Secondary | ICD-10-CM | POA: Diagnosis not present

## 2018-06-03 DIAGNOSIS — I110 Hypertensive heart disease with heart failure: Secondary | ICD-10-CM | POA: Diagnosis not present

## 2018-06-05 DIAGNOSIS — G4733 Obstructive sleep apnea (adult) (pediatric): Secondary | ICD-10-CM | POA: Diagnosis not present

## 2018-06-05 DIAGNOSIS — I110 Hypertensive heart disease with heart failure: Secondary | ICD-10-CM | POA: Diagnosis not present

## 2018-06-09 DIAGNOSIS — G4733 Obstructive sleep apnea (adult) (pediatric): Secondary | ICD-10-CM | POA: Diagnosis not present

## 2018-06-09 DIAGNOSIS — I110 Hypertensive heart disease with heart failure: Secondary | ICD-10-CM | POA: Diagnosis not present

## 2018-06-16 DIAGNOSIS — I110 Hypertensive heart disease with heart failure: Secondary | ICD-10-CM | POA: Diagnosis not present

## 2018-06-16 DIAGNOSIS — G4733 Obstructive sleep apnea (adult) (pediatric): Secondary | ICD-10-CM | POA: Diagnosis not present

## 2018-06-29 ENCOUNTER — Ambulatory Visit (HOSPITAL_COMMUNITY)
Admission: RE | Admit: 2018-06-29 | Discharge: 2018-06-29 | Disposition: A | Discharge status: Home / Self Care | Payer: Medicare Other | Source: Ambulatory Visit | Attending: Cardiology | Admitting: Cardiology

## 2018-06-29 VITALS — BP 134/82 | HR 83 | Wt 166.8 lb

## 2018-06-29 DIAGNOSIS — Z8249 Family history of ischemic heart disease and other diseases of the circulatory system: Secondary | ICD-10-CM | POA: Diagnosis not present

## 2018-06-29 DIAGNOSIS — Z885 Allergy status to narcotic agent status: Secondary | ICD-10-CM | POA: Insufficient documentation

## 2018-06-29 DIAGNOSIS — M1990 Unspecified osteoarthritis, unspecified site: Secondary | ICD-10-CM | POA: Insufficient documentation

## 2018-06-29 DIAGNOSIS — E785 Hyperlipidemia, unspecified: Secondary | ICD-10-CM | POA: Insufficient documentation

## 2018-06-29 DIAGNOSIS — Z794 Long term (current) use of insulin: Secondary | ICD-10-CM

## 2018-06-29 DIAGNOSIS — R188 Other ascites: Secondary | ICD-10-CM

## 2018-06-29 DIAGNOSIS — Z825 Family history of asthma and other chronic lower respiratory diseases: Secondary | ICD-10-CM | POA: Insufficient documentation

## 2018-06-29 DIAGNOSIS — Z888 Allergy status to other drugs, medicaments and biological substances status: Secondary | ICD-10-CM | POA: Insufficient documentation

## 2018-06-29 DIAGNOSIS — I428 Other cardiomyopathies: Secondary | ICD-10-CM | POA: Insufficient documentation

## 2018-06-29 DIAGNOSIS — Z880 Allergy status to penicillin: Secondary | ICD-10-CM | POA: Diagnosis not present

## 2018-06-29 DIAGNOSIS — K761 Chronic passive congestion of liver: Secondary | ICD-10-CM | POA: Diagnosis not present

## 2018-06-29 DIAGNOSIS — Z8261 Family history of arthritis: Secondary | ICD-10-CM | POA: Insufficient documentation

## 2018-06-29 DIAGNOSIS — I5042 Chronic combined systolic (congestive) and diastolic (congestive) heart failure: Secondary | ICD-10-CM | POA: Diagnosis not present

## 2018-06-29 DIAGNOSIS — Z79899 Other long term (current) drug therapy: Secondary | ICD-10-CM | POA: Diagnosis not present

## 2018-06-29 DIAGNOSIS — E119 Type 2 diabetes mellitus without complications: Secondary | ICD-10-CM | POA: Diagnosis not present

## 2018-06-29 DIAGNOSIS — G4733 Obstructive sleep apnea (adult) (pediatric): Secondary | ICD-10-CM | POA: Insufficient documentation

## 2018-06-29 DIAGNOSIS — I5022 Chronic systolic (congestive) heart failure: Secondary | ICD-10-CM | POA: Insufficient documentation

## 2018-06-29 DIAGNOSIS — I110 Hypertensive heart disease with heart failure: Secondary | ICD-10-CM | POA: Insufficient documentation

## 2018-06-29 LAB — BASIC METABOLIC PANEL
Anion gap: 12 (ref 5–15)
BUN: 11 mg/dL (ref 8–23)
CO2: 27 mmol/L (ref 22–32)
Calcium: 9.4 mg/dL (ref 8.9–10.3)
Chloride: 96 mmol/L — ABNORMAL LOW (ref 98–111)
Creatinine, Ser: 0.79 mg/dL (ref 0.44–1.00)
GFR calc Af Amer: >60 mL/min (ref >60)
GFR calc non Af Amer: >60 mL/min (ref >60)
Glucose, Bld: 205 mg/dL — ABNORMAL HIGH (ref 70–99)
Potassium: 3.5 mmol/L (ref 3.5–5.1)
Sodium: 135 mmol/L (ref 135–145)

## 2018-06-29 MED ORDER — GABAPENTIN 300 MG PO CAPS: 300.0000 mg | ORAL_CAPSULE | Freq: Two times a day (BID) | ORAL | 0 refills | Status: DC

## 2018-06-29 MED ORDER — SACUBITRIL-VALSARTAN 49-51 MG PO TABS: 1.0000 | ORAL_TABLET | Freq: Two times a day (BID) | ORAL | 6 refills | Status: DC

## 2018-06-29 NOTE — Patient Instructions (Addendum)
START Gabapentin 300mg  (1 capsule) twice a day  INCREASE Entresto to 49/51mg  (1 tab) twice a day  Labs today We will only contact you if something comes back abnormal or we need to make some changes. Otherwise no news is good news!  Your physician requested that you have labs drawn in 2 weeks  Your physician has requested that you have an echocardiogram. Echocardiography is a painless test that uses sound waves to create images of your heart. It provides your doctor with information about the size and shape of your heart and how well your heart's chambers and valves are working. This procedure takes approximately one hour. There are no restrictions for this procedure.  Your physician recommends that you schedule a follow-up appointment in: 2 months with Dr. Aundra Dubin

## 2018-06-29 NOTE — Progress Notes (Signed)
Advanced Heart Failure Clinic Note   Referring Physician: PCP: Biagio Borg, MD PCP-Cardiologist: Skeet Latch, MD  HF: Dr. Aundra Dubin   HPI:  Gloria [name] is a 67 y.o. female with h/o chronic systolic CHF, h/o chronic diastolic CHF (borderline EF in 2017 with LVEF 45% on myoview), HLD, DM2, and depression.   Follows with Dr. Oval Linsey, but last seen in office 05/2017 and was doing well at that time.   Admitted 11/1 - 11/11 with two months of progressive dyspnea and increased abdominal girth. Pertinent labs on admission include BNP 1504, Cr 0.89, K 2.9, Hgb 11.2, WBC 8.0. CXR showed no acute abnormalities with poor respiratory effort. EKG showed sinus tach with nonspecific ST changes. RUQ US showed significant cirrhosis. Underwent abdominal paracentesis with 6L pulled off her her abdomen. CT angio chest negative for PE, but showed massive ascites and anasarca as above. With drop in EF, taken for [facility] 05/05/18. This showed normal coronaries, mod pulmonary hypertension (most likely pulmonary venous hypertension in setting of volume overload), and equalization of end-diastolic pressures (within 4 mmHg) concerning for restrictive hemodynamic physiology. Diuresed and medications optimized as toelrated. She underwent paracentesis x 2. cMRI consistent with NICM, EF 35%.   She presents today for regular follow up. Irbesartan stopped at last visit, as she had been taking concurrently with Entresto. She did not show up for PharmD visit. She is feeling good overall. She denies any DOE, though she is not very active. Denies lightheadedness or dizziness. No CP, orthopnea, or PND. Lives at home by herself. Hasn't needed any extra diuretics. She has been having trouble sleeping, and trouble with her neuropathy. She was previously on gabapentin.   Echo 05/2018 LVEF 20-25% (Down from 50-55 in 2017), Grade 3 DD, Moderate MR, Mild LAE, Mild RV dilation, mildly reduced RV function, Mod TR, Mild PI, Pa peak  pressure 44 mm Hg.   Cardiac MRI 05/06/18: mild LV dilation with EF 35%, diffuse hypokinesis; mild RV dilation with EF 34%; no delayed enhancement.   Review of systems complete and found to be negative unless listed in HPI.    Social: Lives at home alone. Son lives in [place].   Past Medical History 1. Chronic systolic CHF: Nonischemic cardiomyopathy. Echo 05/02/18 with EF 20-25%, mildly decreased RV systolic function, moderate MR. Cath 05/05/18 no significant coronary disease, markedly elevated right and left heart filling pressures in a restrictive pattern. Cardiac index preserved. HIV negative, no ETOH, no family history of CHF.  -Cardiac MRI 05/06/18 LV EF 35%, RV EF 34%, and no delayed enhancement.  Most likely cause seems to be viral myocarditis. 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  - She states she had a negative sleep study in the past.  2. 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   - Has h/o requiring paracentesis  3. Diabetes:  - Hgb A1C 5.2 05/01/2018. - Consider dapagliflozin/empagliflozin.  Past Medical History:  Diagnosis Date  . Allergic rhinitis, cause unspecified 03/30/2013  . Anxiety state, unspecified   . Arthritis    "knees" (05/01/2018)  . Asthma    hx (05/01/2018)  . CHF (congestive heart failure) ([place])   . Chronic bronchitis   . Depressive disorder, not elsewhere classified   . Difficulty waking    hard to wake up past sedation!  . Migraine, unspecified, without mention of intractable migraine without mention of status migrainosus   . OSA (obstructive sleep apnea)  12/25/2016  . Other and unspecified hyperlipidemia   . Panic attacks   . Postmenopausal symptoms   . Type 2 diabetes, diet controlled ([place])   . Unspecified essential hypertension   . Unspecified urinary incontinence   . UTI (lower urinary tract infection)    on 02/05/16/on meds     Current Outpatient Medications  Medication Sig Dispense Refill  . ACCU-CHEK AVIVA PLUS test strip USE AS DIRECTED FOUR TIMES DAILY 100 each 2  . camphor-menthol (SARNA) lotion Apply 1 application topically 2 (two) times daily. 222 mL 0  . carvedilol (COREG) 3.125 MG tablet Take 1 tablet (3.125 mg total) by mouth 2 (two) times daily with a meal. 60 tablet 11  . feeding supplement, ENSURE ENLIVE, (ENSURE ENLIVE) LIQD Take 237 mLs by mouth 2 (two) times daily between meals. 7 Bottle 0  . glucose blood (ACCU-CHEK AVIVA) test strip Use as instructed four times per day 300 each 11  . Insulin Pen Needle (BD PEN NEEDLE NANO U/F) 32G X 4 MM MISC USE ONCE DAILY 100 each 3  . potassium chloride (K-DUR) 10 MEQ tablet Take 2 tablets (20 mEq total) by mouth daily. 60 tablet 11  . sacubitril-valsartan (ENTRESTO) 24-26 MG Take 1 tablet by mouth 2 (two) times daily. 60 tablet 11  . spironolactone (ALDACTONE) 25 MG tablet Take 1 tablet (25 mg total) by mouth daily. 30 tablet 11  . torsemide (DEMADEX) 20 MG tablet Take 1 tablet (20 mg total) by mouth daily. 30 tablet 11  . gabapentin (NEURONTIN) 600 MG tablet Take 0.5 tablets (300 mg total) by mouth 2 (two) times daily. (Patient not taking: Reported on 06/29/2018) 30 tablet 0   No current facility-administered medications for this encounter.     Allergies  Allergen Reactions  . Compazine Other (See Comments)    Stroke like symptoms  . Haloperidol Decanoate Other (See Comments)    Extrapyramidal syndrome  . Penicillins Nausea And Vomiting  . Glipizide Other (See Comments)    dyspnea  . Metformin And Related Other (See Comments)    GI upset  . Codeine Itching  . Lisinopril Cough      Social History   Socioeconomic History  . Marital status: Divorced    Spouse name: Not on file  . Number of children: 1  . Years of education: Not on file  . Highest education level: Not on file  Occupational History  . Occupation: RETIRED ---Self  employed-interior design/flower arrangements  Social Needs  . Financial resource strain: Not on file  . Food insecurity:    Worry: Not on file    Inability: Not on file  . Transportation needs:    Medical: Not on file    Non-medical: Not on file  Tobacco Use  . Smoking status: Never Smoker  . Smokeless tobacco: Never Used  Substance and Sexual Activity  . Alcohol use: Never    Alcohol/week: 0.0 standard drinks    Frequency: Never  . Drug use: Never  . Sexual activity: Not Currently  Lifestyle  . Physical activity:    Days per week: Not on file    Minutes per session: Not on file  . Stress: Not on file  Relationships  . Social connections:    Talks on phone: Not on file    Gets together: Not on file    Attends religious service: Not on file    Active member of club or organization: Not on file    Attends meetings of clubs or  organizations: Not on file    Relationship status: Not on file  . Intimate partner violence:    Fear of current or ex partner: Not on file    Emotionally abused: Not on file    Physically abused: Not on file    Forced sexual activity: Not on file  Other Topics Concern  . Not on file  Social History Narrative   -Retired - does Company secretary   -Divorced in 2007   -One son - lives locally. 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 and entertain family. Bowling. Interior decorating       Family History  Problem Relation Age of Onset  . Arthritis Unknown        family history  . Hypertension Unknown        grandparent  . Colon cancer Maternal Grandfather   . Asthma Son   . Rheum arthritis Mother   . Hypertension Mother   . Diabetes Maternal Uncle   . Diabetes Paternal Uncle   . Diabetes Maternal Grandmother    Vitals:   06/29/18 1416  BP: 134/82  Pulse: 83  SpO2: (!) 83%  Weight: 75.7 kg (166 lb 12.8 oz)    Wt Readings from Last 3 Encounters:  06/29/18 75.7 kg (166 lb 12.8 oz)  05/19/18 69.9 kg  (154 lb)  05/18/18 70 kg (154 lb 6.4 oz)    PHYSICAL EXAM: General: Well appearing. No resp difficulty. HEENT: Normal Neck: Supple. JVP 6-7 cm. Carotids 2+ bilat; no bruits. No thyromegaly or nodule noted. Cor: PMI nondisplaced. RRR, No M/G/R noted Lungs: CTAB, normal effort. Abdomen: Soft, non-tender, non-distended, no HSM. No bruits or masses. +BS  Extremities: No cyanosis, clubbing, or rash. R and LLE no edema.  Neuro: Alert & orientedx3, cranial nerves grossly intact. moves all 4 extremities w/o difficulty. Affect pleasant   ASSESSMENT & PLAN:  1. Chronic systolic CHF: Nonischemic cardiomyopathy. Echo 05/02/18 with EF 20-25%, mildly decreased RV systolic function, moderate MR. Cath with no significant coronary disease, markedly elevated right and left heart filling pressures in a restrictive pattern. Cardiac index preserved. HIV negative, no ETOH, no family history of CHF. Cardiac MRI with LV EF 35%, RV EF 34%, and no delayed enhancement.  Most likely cause seems to be viral myocarditis. 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 NYHA III chronically and volume status stable on exam - Continue torsemide 20 mg daily. - Continue Spironolactone 25 mg daily  - Continue Coreg 3.125 mg BID - Increase Entresto to 49/51 mg BID. BMET today and 2 weeks.  - Reinforced fluid restriction to < 2 L daily, sodium restriction to less than 2000 mg daily, and the importance of daily weights.   - She states she had a negative sleep study in the past.  2. 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   - Has h/o paracentesis x 2.  - Continue spironolactone as above. Abdomen is soft.   3. Diabetes:  - Hgb A1C 5.2 05/01/2018. - Consider dapagliflozin/empagliflozin. No change.  - Will give 1 month prescription for Gabapentin for neuropathy. Further per PCP. She sees within the month.   Doing well overall.  Tolerating med titration. RTC 2 months with echo  Shirley Friar, PA-C 06/29/18   Greater than 50% of the 25 minute visit was spent in counseling/coordination of care regarding disease state education,  salt/fluid restriction, sliding scale diuretics, and medication compliance.

## 2018-07-13 ENCOUNTER — Other Ambulatory Visit (HOSPITAL_COMMUNITY): Payer: Medicare Other

## 2018-07-17 ENCOUNTER — Other Ambulatory Visit: Payer: Self-pay

## 2018-07-17 ENCOUNTER — Emergency Department (HOSPITAL_COMMUNITY): Payer: Medicare Other

## 2018-07-17 ENCOUNTER — Inpatient Hospital Stay (HOSPITAL_COMMUNITY)
Admission: EM | Admit: 2018-07-17 | Discharge: 2018-07-19 | DRG: 638 | Disposition: A | Discharge status: Home / Self Care | Payer: Medicare Other | Attending: Internal Medicine | Admitting: Internal Medicine

## 2018-07-17 DIAGNOSIS — E1165 Type 2 diabetes mellitus with hyperglycemia: Secondary | ICD-10-CM | POA: Diagnosis present

## 2018-07-17 DIAGNOSIS — I5042 Chronic combined systolic (congestive) and diastolic (congestive) heart failure: Secondary | ICD-10-CM | POA: Diagnosis present

## 2018-07-17 DIAGNOSIS — Z880 Allergy status to penicillin: Secondary | ICD-10-CM

## 2018-07-17 DIAGNOSIS — W19XXXA Unspecified fall, initial encounter: Secondary | ICD-10-CM | POA: Diagnosis present

## 2018-07-17 DIAGNOSIS — Z888 Allergy status to other drugs, medicaments and biological substances status: Secondary | ICD-10-CM

## 2018-07-17 DIAGNOSIS — G4733 Obstructive sleep apnea (adult) (pediatric): Secondary | ICD-10-CM | POA: Diagnosis present

## 2018-07-17 DIAGNOSIS — Z90710 Acquired absence of both cervix and uterus: Secondary | ICD-10-CM | POA: Diagnosis not present

## 2018-07-17 DIAGNOSIS — K7460 Unspecified cirrhosis of liver: Secondary | ICD-10-CM | POA: Diagnosis present

## 2018-07-17 DIAGNOSIS — Z833 Family history of diabetes mellitus: Secondary | ICD-10-CM | POA: Diagnosis not present

## 2018-07-17 DIAGNOSIS — I2510 Atherosclerotic heart disease of native coronary artery without angina pectoris: Secondary | ICD-10-CM | POA: Diagnosis present

## 2018-07-17 DIAGNOSIS — Z79899 Other long term (current) drug therapy: Secondary | ICD-10-CM

## 2018-07-17 DIAGNOSIS — Z8249 Family history of ischemic heart disease and other diseases of the circulatory system: Secondary | ICD-10-CM | POA: Diagnosis not present

## 2018-07-17 DIAGNOSIS — Z800 Family history of malignant neoplasm of digestive organs: Secondary | ICD-10-CM | POA: Diagnosis not present

## 2018-07-17 DIAGNOSIS — K219 Gastro-esophageal reflux disease without esophagitis: Secondary | ICD-10-CM | POA: Diagnosis present

## 2018-07-17 DIAGNOSIS — N179 Acute kidney failure, unspecified: Secondary | ICD-10-CM | POA: Diagnosis present

## 2018-07-17 DIAGNOSIS — I110 Hypertensive heart disease with heart failure: Secondary | ICD-10-CM | POA: Diagnosis present

## 2018-07-17 DIAGNOSIS — I5021 Acute systolic (congestive) heart failure: Secondary | ICD-10-CM | POA: Diagnosis not present

## 2018-07-17 DIAGNOSIS — E1100 Type 2 diabetes mellitus with hyperosmolarity without nonketotic hyperglycemic-hyperosmolar coma (NKHHC): Secondary | ICD-10-CM

## 2018-07-17 DIAGNOSIS — I5020 Unspecified systolic (congestive) heart failure: Secondary | ICD-10-CM | POA: Diagnosis present

## 2018-07-17 DIAGNOSIS — F410 Panic disorder [episodic paroxysmal anxiety] without agoraphobia: Secondary | ICD-10-CM | POA: Diagnosis present

## 2018-07-17 DIAGNOSIS — I4430 Unspecified atrioventricular block: Secondary | ICD-10-CM | POA: Diagnosis not present

## 2018-07-17 DIAGNOSIS — R739 Hyperglycemia, unspecified: Secondary | ICD-10-CM | POA: Diagnosis not present

## 2018-07-17 DIAGNOSIS — E876 Hypokalemia: Secondary | ICD-10-CM | POA: Diagnosis present

## 2018-07-17 DIAGNOSIS — I10 Essential (primary) hypertension: Secondary | ICD-10-CM | POA: Diagnosis not present

## 2018-07-17 DIAGNOSIS — Z8261 Family history of arthritis: Secondary | ICD-10-CM | POA: Diagnosis not present

## 2018-07-17 DIAGNOSIS — Z825 Family history of asthma and other chronic lower respiratory diseases: Secondary | ICD-10-CM | POA: Diagnosis not present

## 2018-07-17 DIAGNOSIS — Z885 Allergy status to narcotic agent status: Secondary | ICD-10-CM

## 2018-07-17 DIAGNOSIS — E1101 Type 2 diabetes mellitus with hyperosmolarity with coma: Secondary | ICD-10-CM | POA: Diagnosis not present

## 2018-07-17 DIAGNOSIS — E785 Hyperlipidemia, unspecified: Secondary | ICD-10-CM | POA: Diagnosis present

## 2018-07-17 DIAGNOSIS — I440 Atrioventricular block, first degree: Secondary | ICD-10-CM | POA: Diagnosis not present

## 2018-07-17 DIAGNOSIS — E860 Dehydration: Secondary | ICD-10-CM | POA: Diagnosis present

## 2018-07-17 DIAGNOSIS — I499 Cardiac arrhythmia, unspecified: Secondary | ICD-10-CM | POA: Diagnosis not present

## 2018-07-17 DIAGNOSIS — H538 Other visual disturbances: Secondary | ICD-10-CM | POA: Diagnosis present

## 2018-07-17 DIAGNOSIS — R079 Chest pain, unspecified: Secondary | ICD-10-CM | POA: Diagnosis not present

## 2018-07-17 LAB — CBC
HCT: 33 % — ABNORMAL LOW (ref 36.0–46.0)
HCT: 32.2 % — ABNORMAL LOW (ref 36.0–46.0)
Hemoglobin: 11.1 g/dL — ABNORMAL LOW (ref 12.0–15.0)
Hemoglobin: 11.5 g/dL — ABNORMAL LOW (ref 12.0–15.0)
MCH: 28.5 pg (ref 26.0–34.0)
MCH: 29.2 pg (ref 26.0–34.0)
MCHC: 33.6 g/dL (ref 30.0–36.0)
MCHC: 35.7 g/dL (ref 30.0–36.0)
MCV: 84.6 fL (ref 80.0–100.0)
MCV: 81.7 fL (ref 80.0–100.0)
Platelets: 259 10*3/uL (ref 150–400)
Platelets: 288 10*3/uL (ref 150–400)
RBC: 3.9 MIL/uL (ref 3.87–5.11)
RBC: 3.94 MIL/uL (ref 3.87–5.11)
RDW: 13 % (ref 11.5–15.5)
RDW: 12.6 % (ref 11.5–15.5)
WBC: 9.4 10*3/uL (ref 4.0–10.5)
WBC: 13.9 10*3/uL — ABNORMAL HIGH (ref 4.0–10.5)
nRBC: 0 % (ref 0.0–0.2)
nRBC: 0 % (ref 0.0–0.2)

## 2018-07-17 LAB — I-STAT CHEM 8, ED
BUN: 19 mg/dL (ref 8–23)
Calcium, Ion: 1.11 mmol/L — ABNORMAL LOW (ref 1.15–1.40)
Chloride: 81 mmol/L — ABNORMAL LOW (ref 98–111)
Creatinine, Ser: 1 mg/dL (ref 0.44–1.00)
Glucose, Bld: >700 mg/dL (ref 70–99)
HCT: 36 % (ref 36.0–46.0)
Hemoglobin: 12.2 g/dL (ref 12.0–15.0)
Potassium: 3.3 mmol/L — ABNORMAL LOW (ref 3.5–5.1)
Sodium: 119 mmol/L — CL (ref 135–145)
TCO2: 26 mmol/L (ref 22–32)

## 2018-07-17 LAB — URINALYSIS, ROUTINE W REFLEX MICROSCOPIC
Bacteria, UA: NONE SEEN
Bilirubin Urine: NEGATIVE
Glucose, UA: >=500 mg/dL — AB
Ketones, ur: NEGATIVE mg/dL
Leukocytes, UA: NEGATIVE
Nitrite: NEGATIVE
Protein, ur: NEGATIVE mg/dL
Specific Gravity, Urine: 1.02 (ref 1.005–1.030)
pH: 6 (ref 5.0–8.0)

## 2018-07-17 LAB — GLUCOSE, CAPILLARY
Glucose-Capillary: 470 mg/dL — ABNORMAL HIGH (ref 70–99)
Glucose-Capillary: 119 mg/dL — ABNORMAL HIGH (ref 70–99)
Glucose-Capillary: 84 mg/dL (ref 70–99)

## 2018-07-17 LAB — BASIC METABOLIC PANEL
Anion gap: 15 (ref 5–15)
BUN: 18 mg/dL (ref 8–23)
CO2: 24 mmol/L (ref 22–32)
Calcium: 9.7 mg/dL (ref 8.9–10.3)
Chloride: 80 mmol/L — ABNORMAL LOW (ref 98–111)
Creatinine, Ser: 1.31 mg/dL — ABNORMAL HIGH (ref 0.44–1.00)
GFR calc Af Amer: 48 mL/min — ABNORMAL LOW (ref >60)
GFR calc non Af Amer: 42 mL/min — ABNORMAL LOW (ref >60)
Glucose, Bld: 1121 mg/dL (ref 70–99)
Potassium: 3.3 mmol/L — ABNORMAL LOW (ref 3.5–5.1)
Sodium: 119 mmol/L — CL (ref 135–145)

## 2018-07-17 LAB — I-STAT VENOUS BLOOD GAS, ED
Acid-Base Excess: 2 mmol/L (ref 0.0–2.0)
Bicarbonate: 28.8 mmol/L — ABNORMAL HIGH (ref 20.0–28.0)
O2 Saturation: 47 %
Patient temperature: 37
TCO2: 30 mmol/L (ref 22–32)
pCO2, Ven: 53.4 mmHg (ref 44.0–60.0)
pH, Ven: 7.341 (ref 7.250–7.430)
pO2, Ven: 28 mmHg — CL (ref 32.0–45.0)

## 2018-07-17 LAB — DIFFERENTIAL
Abs Immature Granulocytes: 0.04 10*3/uL (ref 0.00–0.07)
Basophils Absolute: 0.1 10*3/uL (ref 0.0–0.1)
Basophils Relative: 1 %
Eosinophils Absolute: 0.2 10*3/uL (ref 0.0–0.5)
Eosinophils Relative: 2 %
Immature Granulocytes: 0 %
Lymphocytes Relative: 8 %
Lymphs Abs: 0.7 10*3/uL (ref 0.7–4.0)
Monocytes Absolute: 0.5 10*3/uL (ref 0.1–1.0)
Monocytes Relative: 5 %
Neutro Abs: 7.9 10*3/uL — ABNORMAL HIGH (ref 1.7–7.7)
Neutrophils Relative %: 84 %

## 2018-07-17 LAB — HEPATIC FUNCTION PANEL
ALT: 26 U/L (ref 0–44)
AST: 25 U/L (ref 15–41)
Albumin: 3.5 g/dL (ref 3.5–5.0)
Alkaline Phosphatase: 169 U/L — ABNORMAL HIGH (ref 38–126)
Bilirubin, Direct: 0.4 mg/dL — ABNORMAL HIGH (ref 0.0–0.2)
Indirect Bilirubin: 1.9 mg/dL — ABNORMAL HIGH (ref 0.3–0.9)
Total Bilirubin: 2.3 mg/dL — ABNORMAL HIGH (ref 0.3–1.2)
Total Protein: 8.3 g/dL — ABNORMAL HIGH (ref 6.5–8.1)

## 2018-07-17 LAB — I-STAT TROPONIN, ED: Troponin i, poc: 0 ng/mL (ref 0.00–0.08)

## 2018-07-17 LAB — BRAIN NATRIURETIC PEPTIDE: B Natriuretic Peptide: 74.1 pg/mL (ref 0.0–100.0)

## 2018-07-17 LAB — CBG MONITORING, ED
Glucose-Capillary: >600 mg/dL (ref 70–99)
Glucose-Capillary: >600 mg/dL (ref 70–99)
Glucose-Capillary: >600 mg/dL (ref 70–99)

## 2018-07-17 LAB — LIPASE, BLOOD: Lipase: 33 U/L (ref 11–51)

## 2018-07-17 MED ORDER — SACUBITRIL-VALSARTAN 49-51 MG PO TABS
1.0000 | ORAL_TABLET | Freq: Two times a day (BID) | ORAL | Status: DC
  Administered 2018-07-18 – 2018-07-19 (×4): 1 via ORAL
  Filled 2018-07-17 (×4): qty 1

## 2018-07-17 MED ORDER — ENSURE ENLIVE PO LIQD
237.0000 mL | Freq: Two times a day (BID) | ORAL | Status: DC
  Administered 2018-07-18 – 2018-07-19 (×4): 237 mL via ORAL

## 2018-07-17 MED ORDER — TORSEMIDE 20 MG PO TABS
20.0000 mg | ORAL_TABLET | Freq: Every day | ORAL | Status: DC
  Administered 2018-07-18 (×2): 20 mg via ORAL
  Filled 2018-07-17 (×2): qty 1

## 2018-07-17 MED ORDER — POTASSIUM CHLORIDE CRYS ER 10 MEQ PO TBCR
20.0000 meq | EXTENDED_RELEASE_TABLET | Freq: Every day | ORAL | Status: DC
  Administered 2018-07-18 – 2018-07-19 (×3): 20 meq via ORAL
  Filled 2018-07-17 (×3): qty 2

## 2018-07-17 MED ORDER — POTASSIUM CHLORIDE 10 MEQ/100ML IV SOLN
10.0000 meq | INTRAVENOUS | Status: AC
  Administered 2018-07-17 (×4): 10 meq via INTRAVENOUS
  Filled 2018-07-17 (×4): qty 100

## 2018-07-17 MED ORDER — NITROGLYCERIN 0.4 MG SL SUBL
0.4000 mg | SUBLINGUAL_TABLET | SUBLINGUAL | Status: DC | PRN
  Administered 2018-07-18 (×2): 0.4 mg via SUBLINGUAL
  Filled 2018-07-17 (×2): qty 1

## 2018-07-17 MED ORDER — ONDANSETRON HCL 4 MG PO TABS: 4.0000 mg | ORAL_TABLET | Freq: Four times a day (QID) | ORAL | Status: DC | PRN

## 2018-07-17 MED ORDER — SODIUM CHLORIDE 0.9 % IV SOLN
INTRAVENOUS | Status: DC
  Administered 2018-07-18 (×2): via INTRAVENOUS

## 2018-07-17 MED ORDER — CARVEDILOL 3.125 MG PO TABS
3.1250 mg | ORAL_TABLET | Freq: Two times a day (BID) | ORAL | Status: DC
  Administered 2018-07-18 – 2018-07-19 (×3): 3.125 mg via ORAL
  Filled 2018-07-17 (×3): qty 1

## 2018-07-17 MED ORDER — ACETAMINOPHEN 325 MG PO TABS: 650.0000 mg | ORAL_TABLET | Freq: Four times a day (QID) | ORAL | Status: DC | PRN

## 2018-07-17 MED ORDER — SPIRONOLACTONE 25 MG PO TABS
25.0000 mg | ORAL_TABLET | Freq: Every day | ORAL | Status: DC
  Administered 2018-07-18 – 2018-07-19 (×3): 25 mg via ORAL
  Filled 2018-07-17 (×3): qty 1

## 2018-07-17 MED ORDER — SODIUM CHLORIDE 0.9% FLUSH
3.0000 mL | Freq: Once | INTRAVENOUS | Status: AC
  Administered 2018-07-17: 3 mL via INTRAVENOUS

## 2018-07-17 MED ORDER — INSULIN ASPART 100 UNIT/ML ~~LOC~~ SOLN
0.0000 [IU] | SUBCUTANEOUS | Status: DC
  Administered 2018-07-18 (×2): 2 [IU] via SUBCUTANEOUS
  Administered 2018-07-18: 5 [IU] via SUBCUTANEOUS

## 2018-07-17 MED ORDER — SODIUM CHLORIDE 0.9 % IV BOLUS
1000.0000 mL | Freq: Once | INTRAVENOUS | Status: AC
  Administered 2018-07-17: 1000 mL via INTRAVENOUS

## 2018-07-17 MED ORDER — ONDANSETRON HCL 4 MG/2ML IJ SOLN
4.0000 mg | Freq: Four times a day (QID) | INTRAMUSCULAR | Status: DC | PRN
  Administered 2018-07-17: 4 mg via INTRAVENOUS
  Filled 2018-07-17: qty 2

## 2018-07-17 MED ORDER — ACETAMINOPHEN 650 MG RE SUPP: 650.0000 mg | Freq: Four times a day (QID) | RECTAL | Status: DC | PRN

## 2018-07-17 MED ORDER — MORPHINE SULFATE (PF) 2 MG/ML IV SOLN
2.0000 mg | Freq: Once | INTRAVENOUS | Status: AC
  Administered 2018-07-17: 2 mg via INTRAVENOUS
  Filled 2018-07-17: qty 1

## 2018-07-17 MED ORDER — INSULIN GLARGINE 100 UNIT/ML ~~LOC~~ SOLN
5.0000 [IU] | Freq: Once | SUBCUTANEOUS | Status: AC
  Administered 2018-07-18: 5 [IU] via SUBCUTANEOUS
  Filled 2018-07-17: qty 0.05

## 2018-07-17 MED ORDER — ENOXAPARIN SODIUM 40 MG/0.4ML ~~LOC~~ SOLN
40.0000 mg | SUBCUTANEOUS | Status: DC
  Administered 2018-07-17 – 2018-07-18 (×2): 40 mg via SUBCUTANEOUS
  Filled 2018-07-17 (×3): qty 0.4

## 2018-07-17 MED ORDER — DEXTROSE-NACL 5-0.45 % IV SOLN: INTRAVENOUS | Status: DC

## 2018-07-17 MED ORDER — INSULIN REGULAR(HUMAN) IN NACL 100-0.9 UT/100ML-% IV SOLN
INTRAVENOUS | Status: DC
  Administered 2018-07-17: 5.4 [IU]/h via INTRAVENOUS
  Filled 2018-07-17: qty 100

## 2018-07-17 MED ORDER — SODIUM CHLORIDE 0.9 % IV SOLN: INTRAVENOUS | Status: DC

## 2018-07-17 MED ORDER — INSULIN ASPART 100 UNIT/ML ~~LOC~~ SOLN
10.0000 [IU] | Freq: Once | SUBCUTANEOUS | Status: AC
  Administered 2018-07-17: 10 [IU] via INTRAVENOUS

## 2018-07-17 MED ORDER — FENTANYL CITRATE (PF) 100 MCG/2ML IJ SOLN
50.0000 ug | Freq: Once | INTRAMUSCULAR | Status: AC
  Administered 2018-07-17: 50 ug via INTRAVENOUS
  Filled 2018-07-17: qty 2

## 2018-07-17 NOTE — Progress Notes (Signed)
Pt vomitted. PRN zofran given. Spoke with MD concerning pt BS of 84. Will d/c insulin gtt and administer 5 units of Lantus according to MD orders. Will continue to monitor CBG q4.

## 2018-07-17 NOTE — ED Triage Notes (Signed)
Per ems pt having generalized weakness for couple of days. Also c/o chest pain 10/10. Glucometer read high.

## 2018-07-17 NOTE — ED Notes (Signed)
Patient transported to X-ray 

## 2018-07-17 NOTE — ED Provider Notes (Signed)
[facility] EMERGENCY DEPARTMENT Provider Note   CSN: [id number] Arrival date & time: 07/17/18  1559     History   Chief Complaint Chief Complaint  Patient presents with  . Weakness  . Chest Pain    HPI  Gloria [name] is a 68 y.o. female who presents with chest pain and weakness. PMH significant for Type 2 DM (diet controlled), CAD, CHF, HTN, cirrhosis. The patient is a poor historian. Over the past couple days she's been progressively weaker. Today she started to have substernal chest pain which caused her to fall on the ground. She was very weak and got up and sat on the toilet but couldn't get up so called EMS. Her daughter in law is at bedside and states that the patient has complained of blurry vision and she's noticed she's had trouble speaking. The patient hasn't been taking medicine for diabetes because she was told she didn't need it after her last hospitalization. She denies recent fever, cough, SOB. She has some abdominal pain and swelling. She denies leg swelling. When EMS arrived her glucose was too high to be read.  HPI  Past Medical History:  Diagnosis Date  . Allergic rhinitis, cause unspecified 03/30/2013  . Anxiety state, unspecified   . Arthritis    "knees" (05/01/2018)  . Asthma    hx (05/01/2018)  . CHF (congestive heart failure) ([place])   . Chronic bronchitis   . Depressive disorder, not elsewhere classified   . Difficulty waking    hard to wake up past sedation!  . Migraine, unspecified, without mention of intractable migraine without mention of status migrainosus   . OSA (obstructive sleep apnea) 12/25/2016  . Other and unspecified hyperlipidemia   . Panic attacks   . Postmenopausal symptoms   . Type 2 diabetes, diet controlled ([place])   . Unspecified essential hypertension   . Unspecified urinary incontinence   . UTI (lower urinary tract infection)    on 02/05/16/on meds    Patient Active Problem List   Diagnosis Date Noted  . Other  ascites   . Acute systolic (congestive) heart failure ([place]) 05/03/2018  . Anasarca 05/03/2018  . Cirrhosis of liver with ascites ([place]) 05/03/2018  . Fluid overload 05/01/2018  . Ascites of liver 03/05/2018  . Abdominal swelling, generalized 02/25/2018  . Mouth pain 02/02/2018  . Rash 02/02/2018  . Peripheral neuropathy 08/26/2017  . Non-intractable vomiting with nausea 05/01/2017  . Acute pain of right knee 04/02/2017  . Syst congestive heart failure with reduced LV function, NYHA class 1 ([place]) 03/19/2017  . OSA (obstructive sleep apnea) 12/25/2016  . Hypersomnolence 11/10/2016  . Positive ANA (antinuclear antibody) 07/11/2016  . Primary osteoarthritis of both hands 07/11/2016  . Fx sacrum/coccyx-closed ([place]) 07/11/2016  . Left arm pain 06/25/2016  . Polyarthralgia 03/27/2016  . Degenerative arthritis of knee, bilateral 03/27/2016  . UTI (urinary tract infection) 02/28/2016  . Low back pain 02/28/2016  . Coccyxdynia 02/28/2016  . Bilateral knee pain 02/28/2016  . Bilateral ankle pain 02/28/2016  . Encounter for preventative adult health care exam with abnormal findings 10/03/2015  . Peripheral edema 10/03/2015  . Morbid obesity ([place]) 01/01/2015  . 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 Asthma, chronic 10/17/2014  . Dyspnea 03/29/2014  . Upper airway cough syndrome 03/29/2014  . Vocal cord dysfunction 03/21/2014  . Uncontrolled type 2 diabetes mellitus with diabetic neuropathy, without long-term current use of insulin ([place])   . Allergic rhinitis, cause unspecified 03/30/2013  .  Chronic insomnia 09/21/2012  . Cough 04/15/2012  . Menopause 02/28/2011  . Hot flashes, menopausal 02/28/2011  . Wheezing 11/02/2010  . Hyperlipidemia 06/18/2010  . ARTHRITIS 06/18/2010  . ANXIETY 06/14/2010  . DEPRESSION 06/14/2010  . Migraine 06/14/2010  . Essential hypertension 06/14/2010    Past Surgical History:  Procedure Laterality Date  . ABDOMINAL HYSTERECTOMY  2002  .  DIAGNOSTIC LAPAROSCOPY    . IR PARACENTESIS  05/08/2018  . OPEN REDUCTION SHOULDER DISLOCATION Right   . RIGHT/LEFT HEART CATH AND CORONARY ANGIOGRAPHY N/A 05/05/2018   Procedure: RIGHT/LEFT HEART CATH AND CORONARY ANGIOGRAPHY;  Surgeon: Belva Crome, MD;  Location: [facility];  Service: Cardiovascular;  Laterality: N/A;  . TUBAL LIGATION    . TUMOR EXCISION Right    "ankle"     OB History    Gravida  1   Para  1   Term  1   Preterm      AB      Living  1     SAB      TAB      Ectopic      Multiple      Live Births               Home Medications    Prior to Admission medications   Medication Sig Start Date End Date Taking? Authorizing Provider  ACCU-CHEK AVIVA PLUS test strip USE AS DIRECTED FOUR TIMES DAILY 05/12/18   Biagio Borg, MD  camphor-menthol [facility]) lotion Apply 1 application topically 2 (two) times daily. 05/11/18   Kayleen Memos, DO  carvedilol (COREG) 3.125 MG tablet Take 1 tablet (3.125 mg total) by mouth 2 (two) times daily with a meal. 05/18/18   Tillery, Satira Mccallum, PA-C  feeding supplement, ENSURE ENLIVE, (ENSURE ENLIVE) LIQD Take 237 mLs by mouth 2 (two) times daily between meals. 05/11/18   Kayleen Memos, DO  gabapentin (NEURONTIN) 300 MG capsule Take 1 capsule (300 mg total) by mouth 2 (two) times daily. 06/29/18   Shirley Friar, PA-C  gabapentin (NEURONTIN) 600 MG tablet Take 0.5 tablets (300 mg total) by mouth 2 (two) times daily. Patient not taking: Reported on 06/29/2018 05/11/18   Kayleen Memos, DO  glucose blood (ACCU-CHEK AVIVA) test strip Use as instructed four times per day 08/26/17   Philemon Kingdom, MD  Insulin Pen Needle (BD PEN NEEDLE NANO U/F) 32G X 4 MM MISC USE ONCE DAILY 08/26/17   Philemon Kingdom, MD  potassium chloride (K-DUR) 10 MEQ tablet Take 2 tablets (20 mEq total) by mouth daily. 05/18/18   Shirley Friar, PA-C  sacubitril-valsartan (ENTRESTO) 49-51 MG Take 1 tablet by mouth 2  (two) times daily. 06/29/18   Shirley Friar, PA-C  spironolactone (ALDACTONE) 25 MG tablet Take 1 tablet (25 mg total) by mouth daily. 05/18/18   Shirley Friar, PA-C  torsemide (DEMADEX) 20 MG tablet Take 1 tablet (20 mg total) by mouth daily. 05/18/18   Shirley Friar, PA-C    Family History Family History  Problem Relation Age of Onset  . Arthritis Unknown        family history  . Hypertension Unknown        grandparent  . Colon cancer Maternal Grandfather   . Asthma Son   . Rheum arthritis Mother   . Hypertension Mother   . Diabetes Maternal Uncle   . Diabetes Paternal Uncle   . Diabetes Maternal Grandmother  Social History Social History   Tobacco Use  . Smoking status: Never Smoker  . Smokeless tobacco: Never Used  Substance Use Topics  . Alcohol use: Never    Alcohol/week: 0.0 standard drinks    Frequency: Never  . Drug use: Never     Allergies   Compazine; Haloperidol decanoate; Penicillins; Glipizide; Metformin and related; Codeine; and Lisinopril   Review of Systems Review of Systems  Constitutional: Negative for fever.  Eyes: Positive for visual disturbance.  Respiratory: Negative for cough and shortness of breath.   Cardiovascular: Positive for chest pain. Negative for palpitations and leg swelling.  Gastrointestinal: Positive for abdominal distention and abdominal pain. Negative for diarrhea, nausea and vomiting.  Genitourinary: Negative for dysuria.  Musculoskeletal: Positive for back pain.  Neurological: Positive for dizziness.  All other systems reviewed and are negative.    Physical Exam Updated Vital Signs BP (!) 150/80 (BP Location: Right Arm)   Pulse 70   Temp (!) 97.5 F (36.4 C) (Oral)   Resp 15   Ht 5\' 7"  (1.702 m)   Wt 75.3 kg   SpO2 100%   BMI 26.00 kg/m   Physical Exam Vitals signs and nursing note reviewed.  Constitutional:      General: She is in acute distress (moaning in pain).      Appearance: She is well-developed. She is not diaphoretic.  HENT:     Head: Normocephalic and atraumatic.  Eyes:     General: No scleral icterus.       Right eye: No discharge.        Left eye: No discharge.     Conjunctiva/sclera: Conjunctivae normal.     Pupils: Pupils are equal, round, and reactive to light.  Neck:     Musculoskeletal: Normal range of motion.  Cardiovascular:     Rate and Rhythm: Normal rate and regular rhythm.  Pulmonary:     Effort: Pulmonary effort is normal. No respiratory distress.     Breath sounds: Normal breath sounds.  Chest:     Chest wall: No tenderness.  Abdominal:     General: There is no distension.     Palpations: Abdomen is soft.     Tenderness: There is abdominal tenderness (mild epigastric).  Musculoskeletal:     Right lower leg: No edema.     Left lower leg: No edema.  Skin:    General: Skin is warm and dry.  Neurological:     Mental Status: She is alert and oriented to person, place, and time.  Psychiatric:        Behavior: Behavior normal.      ED Treatments / Results  Labs (all labs ordered are listed, but only abnormal results are displayed) Labs Reviewed  BASIC METABOLIC PANEL - Abnormal; Notable for the following components:      Result Value   Sodium 119 (*)    Potassium 3.3 (*)    Chloride 80 (*)    Glucose, Bld 1,121 (*)    Creatinine, Ser 1.31 (*)    GFR calc non Af Amer 42 (*)    GFR calc Af Amer 48 (*)    All other components within normal limits  CBC - Abnormal; Notable for the following components:   Hemoglobin 11.1 (*)    HCT 33.0 (*)    All other components within normal limits  URINALYSIS, ROUTINE W REFLEX MICROSCOPIC - Abnormal; Notable for the following components:   Color, Urine STRAW (*)    Glucose, UA >=  500 (*)    Hgb urine dipstick MODERATE (*)    All other components within normal limits  HEPATIC FUNCTION PANEL - Abnormal; Notable for the following components:   Total Protein 8.3 (*)    Alkaline  Phosphatase 169 (*)    Total Bilirubin 2.3 (*)    Bilirubin, Direct 0.4 (*)    Indirect Bilirubin 1.9 (*)    All other components within normal limits  DIFFERENTIAL - Abnormal; Notable for the following components:   Neutro Abs 7.9 (*)    All other components within normal limits  I-STAT CHEM 8, ED - Abnormal; Notable for the following components:   Sodium 119 (*)    Potassium 3.3 (*)    Chloride 81 (*)    Glucose, Bld >700 (*)    Calcium, Ion 1.11 (*)    All other components within normal limits  CBG MONITORING, ED - Abnormal; Notable for the following components:   Glucose-Capillary >600 (*)    All other components within normal limits  I-STAT VENOUS BLOOD GAS, ED - Abnormal; Notable for the following components:   pO2, Ven 28.0 (*)    Bicarbonate 28.8 (*)    All other components within normal limits  CBG MONITORING, ED - Abnormal; Notable for the following components:   Glucose-Capillary >600 (*)    All other components within normal limits  BRAIN NATRIURETIC PEPTIDE  LIPASE, BLOOD  I-STAT TROPONIN, ED    EKG EKG Interpretation  Date/Time:  Friday July 17 2018 16:01:29 EST Ventricular Rate:  70 PR Interval:    QRS Duration: 101 QT Interval:  430 QTC Calculation: 464 R Axis:   38 Text Interpretation:  Sinus rhythm Prolonged PR interval Probable left atrial enlargement LVH with secondary repolarization abnormality Baseline wander in lead(s) V1 nonspecific t wave changes resolved compared to prior tracing Confirmed by Dorie Rank [phone]) on 07/17/2018 4:07:41 PM   Radiology Dg Chest 2 View  Result Date: 07/17/2018 CLINICAL DATA:  Chest pain EXAM: CHEST - 2 VIEW COMPARISON:  05/01/2018 FINDINGS: The heart size and mediastinal contours are within normal limits. Both lungs are clear. The visualized skeletal structures are unremarkable. IMPRESSION: No active cardiopulmonary disease. Electronically Signed   By: Kathreen Devoid   On: 07/17/2018 17:10    Procedures Procedures  (including critical care time)  CRITICAL CARE Performed by: Recardo Evangelist   Total critical care time: 35 minutes  Critical care time was exclusive of separately billable procedures and treating other patients.  Critical care was necessary to treat or prevent imminent or life-threatening deterioration.  Critical care was time spent personally by me on the following activities: development of treatment plan with patient and/or surrogate as well as nursing, discussions with consultants, evaluation of patient's response to treatment, examination of patient, obtaining history from patient or surrogate, ordering and performing treatments and interventions, ordering and review of laboratory studies, ordering and review of radiographic studies, pulse oximetry and re-evaluation of patient's condition.   Medications Ordered in ED Medications  sodium chloride 0.9 % bolus 1,000 mL (1,000 mLs Intravenous New Bag/Given 07/17/18 1733)  insulin regular, human (MYXREDLIN) 100 units/ 100 mL infusion (5.4 Units/hr Intravenous New Bag/Given 07/17/18 1717)  potassium chloride 10 mEq in 100 mL IVPB (10 mEq Intravenous New Bag/Given 07/17/18 1820)  sodium chloride flush (NS) 0.9 % injection 3 mL (3 mLs Intravenous Given 07/17/18 1700)  insulin aspart (novoLOG) injection 10 Units (10 Units Intravenous Given 07/17/18 1733)  fentaNYL (SUBLIMAZE) injection 50 mcg (50 mcg  Intravenous Given 07/17/18 1722)     Initial Impression / Assessment and Plan / ED Course  I have reviewed the triage vital signs and the nursing notes.  Pertinent labs & imaging results that were available during my care of the patient were reviewed by me and considered in my medical decision making (see chart for details).  68 year old female presents with chest pain and weakness. She is hypertensive on arrival. Other vitals are normal. History is limited due to the patient's condition. She is in a lot of pain and is lethargic.  Heart is regular  rate and rhythm.  Lungs are clear to auscultation.  Chest is nontender.  Abdomen is tender in the epigastric area.  She is lethargic and uncomfortable appearing.  No lower extremity edema.  Shared visit with Dr. Tomi Bamberger.  I-STAT Chem-8 is initially concerning for DKA.  Will start insulin drip, fluids, and provide pain control.  EKG is sinus rhythm with prolonged PR and LVH.  Chest x-ray is normal.  I-STAT troponin is normal.  CBC is remarkable for mild anemia.  CMP is remarkable for a glucose of 1121.  Her sodium is 119 which is likely from pseudohyponatremia.  Her anion gap is 15.  Potassium is 3.3.  She has a mild AKI. AP is 169. Bilirubin is 2.3. Overall clinical picture is concerning for HHS. Will consult hospitalist for admission.  Final Clinical Impressions(s) / ED Diagnoses   Final diagnoses:  Diabetic hyperosmolar non-ketotic state ([place])  AKI (acute kidney injury) [facility])  Hypokalemia    ED Discharge Orders    None       Recardo Evangelist, PA-C 07/17/18 1926    Dorie Rank, MD 07/18/18 [phone]

## 2018-07-17 NOTE — H&P (Signed)
History and Physical   GRADY [name] [name]:[id number] DOB: [birth year] DOA: 07/17/2018  Referring MD/NP/PA: Dr. Tomi [name]  PCP: Gloria Borg, MD   Outpatient Specialists: Dr. Conchita [name], cardiology  Patient coming from: Home  Chief Complaint: Weakness chest pain and blurry vision  HPI: Gloria [name] is a 68 y.o. female with medical history significant of diet-controlled diabetes, coronary artery disease, liver cirrhosis, hypertension, diastolic CHF who apparently has been eating and drinking whatever she wanted.  She has not been taking any medication for her diabetes.  Patient started having substernal chest pain today to the extent that she fell to the ground.  She was weak.  She could not get out of bed or toilet today.  She also was having blurry vision.  Daughter-in-law noticed speech changes and generally unwell.  Patient was brought to the ER where her blood sugar was more than 8000.  She was having blurry vision and generally weak.  Diagnosis of hyperosmolar hyperglycemia was done and patient is being admitted to the hospital for treatment..  ED Course: Temperature 97.5 blood pressure 160/105 pulse 85 respirate of 22 oxygen sat 98% room air.  Initial lab showed sodium 119 potassium 3.3 chloride of 80 CO2 24 glucose 1121 BUN is 18 creatinine 1.31 and calcium 9.7.  Her anion gap however is 15.  LFTs appear to be relatively stable except for total bilirubin 2.3.  Her white count is 13.9 hemoglobin 11.5 and platelets 288.  Urinalysis essentially negative for infection. Chest x-ray showed no active findings.  Patient has been initiated on IV insulin and is being admitted to the hospital.  Review of Systems: As per HPI otherwise 10 point review of systems negative.    Past Medical History:  Diagnosis Date  . Allergic rhinitis, cause unspecified 03/30/2013  . Anxiety state, unspecified   . Arthritis    "knees" (05/01/2018)  . Asthma    hx (05/01/2018)  . CHF (congestive heart failure) ([place])   .  Chronic bronchitis   . Depressive disorder, not elsewhere classified   . Difficulty waking    hard to wake up past sedation!  . Migraine, unspecified, without mention of intractable migraine without mention of status migrainosus   . OSA (obstructive sleep apnea) 12/25/2016  . Other and unspecified hyperlipidemia   . Panic attacks   . Postmenopausal symptoms   . Type 2 diabetes, diet controlled ([place])   . Unspecified essential hypertension   . Unspecified urinary incontinence   . UTI (lower urinary tract infection)    on 02/05/16/on meds    Past Surgical History:  Procedure Laterality Date  . ABDOMINAL HYSTERECTOMY  2002  . DIAGNOSTIC LAPAROSCOPY    . IR PARACENTESIS  05/08/2018  . OPEN REDUCTION SHOULDER DISLOCATION Right   . RIGHT/LEFT HEART CATH AND CORONARY ANGIOGRAPHY N/A 05/05/2018   Procedure: RIGHT/LEFT HEART CATH AND CORONARY ANGIOGRAPHY;  Surgeon: Belva Crome, MD;  Location: [facility];  Service: Cardiovascular;  Laterality: N/A;  . TUBAL LIGATION    . TUMOR EXCISION Right    "ankle"     reports that she has never smoked. She has never used smokeless tobacco. She reports that she does not drink alcohol or use drugs.  Allergies  Allergen Reactions  . Compazine Other (See Comments)    Stroke like symptoms  . Haloperidol Decanoate Other (See Comments)    Extrapyramidal syndrome  . Penicillins Nausea And Vomiting  . Glipizide Other (See Comments)    dyspnea  .  Metformin And Related Other (See Comments)    GI upset  . Codeine Itching  . Lisinopril Cough    Family History  Problem Relation Age of Onset  . Arthritis Unknown        family history  . Hypertension Unknown        grandparent  . Colon cancer Maternal Grandfather   . Asthma Son   . Rheum arthritis Mother   . Hypertension Mother   . Diabetes Maternal Uncle   . Diabetes Paternal Uncle   . Diabetes Maternal Grandmother      Prior to Admission medications   Medication Sig Start Date End  Date Taking? Authorizing Provider  carvedilol (COREG) 3.125 MG tablet Take 1 tablet (3.125 mg total) by mouth 2 (two) times daily with a meal. 05/18/18  Yes Tillery, Satira Mccallum, PA-C  potassium chloride (K-DUR) 10 MEQ tablet Take 2 tablets (20 mEq total) by mouth daily. 05/18/18  Yes Shirley Friar, PA-C  sacubitril-valsartan (ENTRESTO) 49-51 MG Take 1 tablet by mouth 2 (two) times daily. 06/29/18  Yes Shirley Friar, PA-C  spironolactone (ALDACTONE) 25 MG tablet Take 1 tablet (25 mg total) by mouth daily. 05/18/18  Yes Shirley Friar, PA-C  torsemide (DEMADEX) 20 MG tablet Take 1 tablet (20 mg total) by mouth daily. 05/18/18  Yes Shirley Friar, PA-C  ACCU-CHEK AVIVA PLUS test strip USE AS DIRECTED FOUR TIMES DAILY 05/12/18   Gloria Borg, MD  camphor-menthol [facility]) lotion Apply 1 application topically 2 (two) times daily. Patient not taking: Reported on 07/17/2018 05/11/18   Gloria Memos, DO  feeding supplement, ENSURE ENLIVE, (ENSURE ENLIVE) LIQD Take 237 mLs by mouth 2 (two) times daily between meals. 05/11/18   Gloria Memos, DO  gabapentin (NEURONTIN) 300 MG capsule Take 1 capsule (300 mg total) by mouth 2 (two) times daily. Patient not taking: Reported on 07/17/2018 06/29/18   Shirley Friar, PA-C  gabapentin (NEURONTIN) 600 MG tablet Take 0.5 tablets (300 mg total) by mouth 2 (two) times daily. Patient not taking: Reported on 06/29/2018 05/11/18   Gloria Memos, DO  glucose blood (ACCU-CHEK AVIVA) test strip Use as instructed four times per day 08/26/17   Gloria Kingdom, MD  Insulin Pen Needle (BD PEN NEEDLE NANO U/F) 32G X 4 MM MISC USE ONCE DAILY 08/26/17   Gloria Kingdom, MD    Physical Exam: Vitals:   07/17/18 1603 07/17/18 1606  BP:  (!) 150/80  Pulse:  70  Resp:  15  Temp:  (!) 97.5 F (36.4 C)  TempSrc:  Oral  SpO2: 100% 100%  Weight:  75.3 kg  Height:  5\' 7"  (1.702 m)      Constitutional: NAD, calm,  comfortable Vitals:   07/17/18 1603 07/17/18 1606  BP:  (!) 150/80  Pulse:  70  Resp:  15  Temp:  (!) 97.5 F (36.4 C)  TempSrc:  Oral  SpO2: 100% 100%  Weight:  75.3 kg  Height:  5\' 7"  (1.702 m)   Eyes: PERRL, lids and conjunctivae normal ENMT: Mucous membranes are moist. Posterior pharynx clear of any exudate or lesions.Normal dentition.  Neck: normal, supple, no masses, no thyromegaly Respiratory: clear to auscultation bilaterally, no wheezing, no crackles. Normal respiratory effort. No accessory muscle use.  Cardiovascular: Regular rate and rhythm, no murmurs / rubs / gallops. No extremity edema. 2+ pedal pulses. No carotid bruits.  Abdomen: no tenderness, no masses palpated. No hepatosplenomegaly. Bowel sounds positive.  Musculoskeletal: no  clubbing / cyanosis. No joint deformity upper and lower extremities. Good ROM, no contractures. Normal muscle tone.  Skin: no rashes, lesions, ulcers. No induration Neurologic: CN 2-12 grossly intact. Sensation intact, DTR normal. Strength 5/5 in all 4.  Psychiatric: Normal judgment and insight. Alert and oriented x 3. Normal mood.     Labs on Admission: I have personally reviewed following labs and imaging studies  CBC: Recent Labs  Lab 07/17/18 1614 07/17/18 1711  WBC  --  9.4  NEUTROABS  --  7.9*  HGB 12.2 11.1*  HCT 36.0 33.0*  MCV  --  84.6  PLT  --  322   Basic Metabolic Panel: Recent Labs  Lab 07/17/18 1614 07/17/18 1711  NA 119* 119*  K 3.3* 3.3*  CL 81* 80*  CO2  --  24  GLUCOSE >700* 1,121*  BUN 19 18  CREATININE 1.00 1.31*  CALCIUM  --  9.7   GFR: Estimated Creatinine Clearance: 43.5 mL/min (A) (by C-G formula based on SCr of 1.31 mg/dL (H)). Liver Function Tests: Recent Labs  Lab 07/17/18 1711  AST 25  ALT 26  ALKPHOS 169*  BILITOT 2.3*  PROT 8.3*  ALBUMIN 3.5   Recent Labs  Lab 07/17/18 1704  LIPASE 33   No results for input(s): AMMONIA in the last 168 hours. Coagulation Profile: No  results for input(s): INR, PROTIME in the last 168 hours. Cardiac Enzymes: No results for input(s): CKTOTAL, CKMB, CKMBINDEX, TROPONINI in the last 168 hours. BNP (last 3 results) No results for input(s): PROBNP in the last 8760 hours. HbA1C: No results for input(s): HGBA1C in the last 72 hours. CBG: Recent Labs  Lab 07/17/18 1603 07/17/18 1820  GLUCAP >600* >600*   Lipid Profile: No results for input(s): CHOL, HDL, LDLCALC, TRIG, CHOLHDL, LDLDIRECT in the last 72 hours. Thyroid Function Tests: No results for input(s): TSH, T4TOTAL, FREET4, T3FREE, THYROIDAB in the last 72 hours. Anemia Panel: No results for input(s): VITAMINB12, FOLATE, FERRITIN, TIBC, IRON, RETICCTPCT in the last 72 hours. Urine analysis:    Component Value Date/Time   COLORURINE STRAW (A) 07/17/2018 1711   APPEARANCEUR CLEAR 07/17/2018 1711   LABSPEC 1.020 07/17/2018 1711   PHURINE 6.0 07/17/2018 1711   GLUCOSEU >=500 (A) 07/17/2018 1711   GLUCOSEU NEGATIVE 02/25/2018 1500   HGBUR MODERATE (A) 07/17/2018 1711   BILIRUBINUR NEGATIVE 07/17/2018 1711   BILIRUBINUR negatvie 11/04/2016 1312   KETONESUR NEGATIVE 07/17/2018 1711   PROTEINUR NEGATIVE 07/17/2018 1711   UROBILINOGEN 0.2 02/25/2018 1500   NITRITE NEGATIVE 07/17/2018 1711   LEUKOCYTESUR NEGATIVE 07/17/2018 1711   Sepsis Labs: @LABRCNTIP (procalcitonin:4,lacticidven:4) )No results found for this or any previous visit (from the past 240 hour(s)).   Radiological Exams on Admission: Dg Chest 2 View  Result Date: 07/17/2018 CLINICAL DATA:  Chest pain EXAM: CHEST - 2 VIEW COMPARISON:  05/01/2018 FINDINGS: The heart size and mediastinal contours are within normal limits. Both lungs are clear. The visualized skeletal structures are unremarkable. IMPRESSION: No active cardiopulmonary disease. Electronically Signed   By: Kathreen Devoid   On: 07/17/2018 17:10    Assessment/Plan Principal Problem:   Hyperglycemia Active Problems:   Hyperlipidemia    Essential hypertension   Morbid obesity (HCC)   OSA (obstructive sleep apnea)   Syst congestive heart failure with reduced LV function, NYHA class 1 ([place])     #1 hyperosmolar hyperglycemia nonketotic: Patient's anion gap is normal.  It appears hypoglycemia from not taking any medications.  Also poor dietary intake.  Patient will be admitted on IV insulin drip.  IV fluids aggressively saline.  Once blood sugar is controlled we will initiate therapy for home.  Diabetic teaching and counseling will also be provided.  #2 hypertension: We will resume home medication and titrate.  #3 obstructive sleep apnea: Not using any CPAP at home.  Monitor in the hospital.  #4 history of congestive heart failure: Patient follows up with cardiology in the outpatient setting.  We will be careful with fluid therefore although she looks intravascularly dry at the moment.  Continue treatment.  #5 hypokalemia: Replete potassium.  Most likely he will go down again due to insulin.  #6 Pseudohyponatremia: Follow sodium level as blood sugar is corrected.  #7 blurry vision: May be related to hypoglycemia.  Patient will need ophthalmology eye exam for diabetic at discharge.    DVT prophylaxis: Heparin Code Status: Full code Family Communication: Daughter in law with the patient Disposition Plan: Home Consults called: None Admission status: Inpatient  Severity of Illness: The appropriate patient status for this patient is INPATIENT. Inpatient status is judged to be reasonable and necessary in order to provide the required intensity of service to ensure the patient's safety. The patient's presenting symptoms, physical exam findings, and initial radiographic and laboratory data in the context of their chronic comorbidities is felt to place them at high risk for further clinical deterioration. Furthermore, it is not anticipated that the patient will be medically stable for discharge from the hospital within 2 midnights  of admission. The following factors support the patient status of inpatient.   " The patient's presenting symptoms include weakness and hypoglycemia. " The worrisome physical exam findings include blurry vision and generalized weakness. " The initial radiographic and laboratory data are worrisome because of blood sugar more than 8000. " The chronic co-morbidities include diabetes.   * I certify that at the point of admission it is my clinical judgment that the patient will require inpatient hospital care spanning beyond 2 midnights from the point of admission due to high intensity of service, high risk for further deterioration and high frequency of surveillance required.Barbette Merino MD [facility] Pager [phone]  If 7PM-7AM, please contact night-coverage [email or web address] Password [facility]  07/17/2018, 6:57 PM

## 2018-07-18 ENCOUNTER — Other Ambulatory Visit (HOSPITAL_COMMUNITY): Payer: Medicare Other

## 2018-07-18 ENCOUNTER — Inpatient Hospital Stay (HOSPITAL_COMMUNITY): Payer: Medicare Other

## 2018-07-18 DIAGNOSIS — I5021 Acute systolic (congestive) heart failure: Secondary | ICD-10-CM

## 2018-07-18 LAB — BASIC METABOLIC PANEL
Anion gap: 18 — ABNORMAL HIGH (ref 5–15)
Anion gap: 15 (ref 5–15)
Anion gap: 17 — ABNORMAL HIGH (ref 5–15)
Anion gap: 15 (ref 5–15)
BUN: 14 mg/dL (ref 8–23)
BUN: 14 mg/dL (ref 8–23)
BUN: 15 mg/dL (ref 8–23)
BUN: 14 mg/dL (ref 8–23)
CO2: 21 mmol/L — ABNORMAL LOW (ref 22–32)
CO2: 21 mmol/L — ABNORMAL LOW (ref 22–32)
CO2: 23 mmol/L (ref 22–32)
CO2: 21 mmol/L — ABNORMAL LOW (ref 22–32)
Calcium: 10.2 mg/dL (ref 8.9–10.3)
Calcium: 9.6 mg/dL (ref 8.9–10.3)
Calcium: 9.7 mg/dL (ref 8.9–10.3)
Calcium: 9 mg/dL (ref 8.9–10.3)
Chloride: 97 mmol/L — ABNORMAL LOW (ref 98–111)
Chloride: 97 mmol/L — ABNORMAL LOW (ref 98–111)
Chloride: 98 mmol/L (ref 98–111)
Chloride: 97 mmol/L — ABNORMAL LOW (ref 98–111)
Creatinine, Ser: 0.88 mg/dL (ref 0.44–1.00)
Creatinine, Ser: 0.7 mg/dL (ref 0.44–1.00)
Creatinine, Ser: 0.81 mg/dL (ref 0.44–1.00)
Creatinine, Ser: 0.9 mg/dL (ref 0.44–1.00)
GFR calc Af Amer: >60 mL/min (ref >60)
GFR calc Af Amer: >60 mL/min (ref >60)
GFR calc Af Amer: >60 mL/min (ref >60)
GFR calc non Af Amer: >60 mL/min (ref >60)
GFR calc non Af Amer: >60 mL/min (ref >60)
GFR calc non Af Amer: >60 mL/min (ref >60)
Glucose, Bld: 82 mg/dL (ref 70–99)
Glucose, Bld: 143 mg/dL — ABNORMAL HIGH (ref 70–99)
Glucose, Bld: 144 mg/dL — ABNORMAL HIGH (ref 70–99)
Glucose, Bld: 271 mg/dL — ABNORMAL HIGH (ref 70–99)
Potassium: 3 mmol/L — ABNORMAL LOW (ref 3.5–5.1)
Potassium: 3.3 mmol/L — ABNORMAL LOW (ref 3.5–5.1)
Potassium: 3.3 mmol/L — ABNORMAL LOW (ref 3.5–5.1)
Potassium: 3.3 mmol/L — ABNORMAL LOW (ref 3.5–5.1)
Sodium: 136 mmol/L (ref 135–145)
Sodium: 135 mmol/L (ref 135–145)
Sodium: 136 mmol/L (ref 135–145)
Sodium: 133 mmol/L — ABNORMAL LOW (ref 135–145)

## 2018-07-18 LAB — CBC
HCT: 31.5 % — ABNORMAL LOW (ref 36.0–46.0)
Hemoglobin: 11.2 g/dL — ABNORMAL LOW (ref 12.0–15.0)
MCH: 29.1 pg (ref 26.0–34.0)
MCHC: 35.6 g/dL (ref 30.0–36.0)
MCV: 81.8 fL (ref 80.0–100.0)
Platelets: 252 10*3/uL (ref 150–400)
RBC: 3.85 MIL/uL — ABNORMAL LOW (ref 3.87–5.11)
RDW: 12.6 % (ref 11.5–15.5)
WBC: 13.3 10*3/uL — ABNORMAL HIGH (ref 4.0–10.5)
nRBC: 0 % (ref 0.0–0.2)

## 2018-07-18 LAB — GLUCOSE, CAPILLARY
Glucose-Capillary: 125 mg/dL — ABNORMAL HIGH (ref 70–99)
Glucose-Capillary: 131 mg/dL — ABNORMAL HIGH (ref 70–99)
Glucose-Capillary: 175 mg/dL — ABNORMAL HIGH (ref 70–99)
Glucose-Capillary: 208 mg/dL — ABNORMAL HIGH (ref 70–99)
Glucose-Capillary: 291 mg/dL — ABNORMAL HIGH (ref 70–99)
Glucose-Capillary: 397 mg/dL — ABNORMAL HIGH (ref 70–99)

## 2018-07-18 LAB — ECHOCARDIOGRAM COMPLETE
Height: 67 in
Weight: 2677.27 oz

## 2018-07-18 LAB — TROPONIN I
Troponin I: <0.03 ng/mL (ref <0.03)
Troponin I: <0.03 ng/mL (ref <0.03)
Troponin I: <0.03 ng/mL (ref <0.03)

## 2018-07-18 LAB — HEMOGLOBIN A1C
Hgb A1c MFr Bld: 10 % — ABNORMAL HIGH (ref 4.8–5.6)
Mean Plasma Glucose: 240.3 mg/dL

## 2018-07-18 LAB — MRSA PCR SCREENING: MRSA by PCR: NEGATIVE

## 2018-07-18 MED ORDER — DIPHENHYDRAMINE HCL 25 MG PO CAPS
25.0000 mg | ORAL_CAPSULE | Freq: Once | ORAL | Status: AC
  Administered 2018-07-18: 25 mg via ORAL
  Filled 2018-07-18: qty 1

## 2018-07-18 MED ORDER — ATORVASTATIN CALCIUM 40 MG PO TABS
40.0000 mg | ORAL_TABLET | Freq: Every day | ORAL | Status: DC
  Administered 2018-07-19: 40 mg via ORAL
  Filled 2018-07-18 (×2): qty 1

## 2018-07-18 MED ORDER — INSULIN GLARGINE 100 UNIT/ML ~~LOC~~ SOLN
15.0000 [IU] | Freq: Every day | SUBCUTANEOUS | Status: DC
  Administered 2018-07-18: 15 [IU] via SUBCUTANEOUS
  Filled 2018-07-18 (×2): qty 0.15

## 2018-07-18 MED ORDER — INSULIN ASPART 100 UNIT/ML ~~LOC~~ SOLN
0.0000 [IU] | Freq: Three times a day (TID) | SUBCUTANEOUS | Status: DC
  Administered 2018-07-18: 15 [IU] via SUBCUTANEOUS

## 2018-07-18 MED ORDER — POTASSIUM CHLORIDE CRYS ER 20 MEQ PO TBCR
40.0000 meq | EXTENDED_RELEASE_TABLET | Freq: Once | ORAL | Status: AC
  Administered 2018-07-18: 40 meq via ORAL
  Filled 2018-07-18: qty 2

## 2018-07-18 MED ORDER — ALPRAZOLAM 0.25 MG PO TABS
0.2500 mg | ORAL_TABLET | Freq: Once | ORAL | Status: AC
  Administered 2018-07-19: 0.25 mg via ORAL
  Filled 2018-07-18: qty 1

## 2018-07-18 MED ORDER — SODIUM CHLORIDE 0.9 % IV SOLN
INTRAVENOUS | Status: AC
  Administered 2018-07-18 – 2018-07-19 (×2): via INTRAVENOUS

## 2018-07-18 NOTE — Progress Notes (Signed)
Pt c/o extreme generalized itching. Paged MD requesting benadryl. Will continue to monitor and treat according to MD orders.

## 2018-07-18 NOTE — Progress Notes (Signed)
*  PRELIMINARY RESULTS* Echocardiogram 2D Echocardiogram has been performed.  Samuel Germany 07/18/2018, 4:15 PM

## 2018-07-18 NOTE — Evaluation (Signed)
Physical Therapy Evaluation Patient Details Name: Gloria [name] MRN: [id number] DOB: [birth year] Today's Date: 07/18/2018   History of Present Illness  68 y.o. female with medical history significant of diet-controlled diabetes, coronary artery disease, liver cirrhosis, hypertension, diastolic CHF. Came to ED due to weakness, blurry vision, and generalizaed weakness. Found to have BG greater than 1000. she was admitted for hyperosmolar nonketotic state   Clinical Impression  PTA pt living alone in single story home with level entry, completely independent with mobility without AD and iADLs. Pt is currently limited in safe mobility by decreased balance, strength and endurance. Pt requires mod I for bed mobility, and minA for sit>stand and ambulation of 80 feet with assist of IV pole. PT recommending HHPT and 24 hour/day support at home. Pt states her son would be home to help her on 07/19/18. PT will continue to follow acutely and will work on [employer] training in next session.     Follow Up Recommendations Home health PT;Supervision/Assistance - 24 hour    Equipment Recommendations  Rolling walker with 5" wheels    Recommendations for Other Services       Precautions / Restrictions Precautions Precautions: Fall Precaution Comments: hx of falling due to weakness Restrictions Weight Bearing Restrictions: No      Mobility  Bed Mobility Overal bed mobility: Modified Independent             General bed mobility comments: increased time and effort to come to EoB  Transfers Overall transfer level: Needs assistance Equipment used: 1 person hand held assist Transfers: Sit to/from Stand Sit to Stand: Min assist         General transfer comment: minA for steadying in upright  Ambulation/Gait Ambulation/Gait assistance: Min assist Gait Distance (Feet): 80 Feet Assistive device: IV Pole Gait Pattern/deviations: Step-through pattern;Decreased step length - right;Decreased step length -  left;Shuffle;Trunk flexed Gait velocity: slowed  Gait velocity interpretation: <1.8 ft/sec, indicate of risk for recurrent falls General Gait Details: minA for steadying while ambulating with IV pole, 3xLoB requiring minA fo r        Balance Overall balance assessment: Needs assistance Sitting-balance support: No upper extremity supported;Feet supported Sitting balance-Leahy Scale: Fair     Standing balance support: During functional activity;Single extremity supported Standing balance-Leahy Scale: Poor                               Pertinent Vitals/Pain Pain Assessment: No/denies pain    Home Living Family/patient expects to be discharged to:: Private residence Living Arrangements: Alone Available Help at Discharge: Family;Available 24 hours/day(son will be available 07/19/18) Type of Home: House Home Access: Level entry     Home Layout: One level Home Equipment: None      Prior Function Level of Independence: Independent         Comments: weakness onset last 3 days     Hand Dominance        Extremity/Trunk Assessment   Upper Extremity Assessment Upper Extremity Assessment: Generalized weakness    Lower Extremity Assessment Lower Extremity Assessment: Generalized weakness       Communication   Communication: No difficulties  Cognition Arousal/Alertness: Awake/alert Behavior During Therapy: Flat affect Overall Cognitive Status: Within Functional Limits for tasks assessed  General Comments General comments (skin integrity, edema, etc.): SaO2 on RA with ambulation dropped to 87%O2, with sitting rebounded to 95%O2,         Assessment/Plan    PT Assessment Patient needs continued PT services  PT Problem List Decreased strength;Decreased activity tolerance;Decreased balance;Decreased mobility;Pain       PT Treatment Interventions DME instruction;Gait training;Functional mobility  training;Therapeutic activities;Therapeutic exercise;Balance training;Patient/family education    PT Goals (Current goals can be found in the Care Plan section)  Acute Rehab PT Goals Patient Stated Goal: get home PT Goal Formulation: With patient Time For Goal Achievement: 08/01/18 Potential to Achieve Goals: Good    Frequency Min 3X/week    AM-PAC PT "6 Clicks" Mobility  Outcome Measure Help needed turning from your back to your side while in a flat bed without using bedrails?: A Little Help needed moving from lying on your back to sitting on the side of a flat bed without using bedrails?: A Little Help needed moving to and from a bed to a chair (including a wheelchair)?: A Little Help needed standing up from a chair using your arms (e.g., wheelchair or bedside chair)?: A Little Help needed to walk in hospital room?: A Little Help needed climbing 3-5 steps with a railing? : A Lot 6 Click Score: 17    End of Session Equipment Utilized During Treatment: Gait belt Activity Tolerance: Patient limited by fatigue Patient left: in chair;with call bell/phone within reach Nurse Communication: Mobility status(need for chair alarm placed, gave RN pad as he was going in ) PT Visit Diagnosis: Unsteadiness on feet (R26.81);Other abnormalities of gait and mobility (R26.89)    Time: 8616-8372 PT Time Calculation (min) (ACUTE ONLY): 23 min   Charges:   PT Evaluation $PT Eval Moderate Complexity: 1 Mod PT Treatments $Gait Training: 8-22 mins        Xiong Haidar B. Migdalia Dk PT, DPT Acute Rehabilitation Services Pager [phone] Office [phone]   [place] 07/18/2018, 1:25 PM

## 2018-07-18 NOTE — Progress Notes (Signed)
;  p/'  AACE/ADA: New Consensus Statement on Inpatient Glycemic Control   Target Ranges:  Prepandial:   less than 140 mg/dL      Peak postprandial:   less than 180 mg/dL (1-2 hours)      Critically ill patients:  140 - 180 mg/dL  Results for Gloria [name], Gloria [name] (MRN [id number]) as of 07/18/2018 13:49  Ref. Range 07/18/2018 00:06 07/18/2018 04:04 07/18/2018 08:06 07/18/2018 12:03  Glucose-Capillary Latest Ref Range: 70 - 99 mg/dL 125 (H)  Lantus 5 units @ 00:05  Novolog 2 units 208 (H)  Novolog 5 units 131 (H)  Novolog 2 units 291 (H)   Results for Gloria [name], Gloria [name] (MRN [id number]) as of 07/18/2018 13:49  Ref. Range 07/17/2018 17:11  Glucose Latest Ref Range: 70 - 99 mg/dL 1,121 [facility])   Results for Gloria [name], Gloria [name] (MRN [id number]) as of 07/18/2018 13:49  Ref. Range 05/01/2018 22:06 07/18/2018 02:55  Hemoglobin A1C Latest Ref Range: 4.8 - 5.6 % 5.2 10.0 (H)   Review of Glycemic Control  Diabetes history: DM2 Outpatient Diabetes medications: None; was taking Lantus 24 units daily and Metformin 500 mg BID but it was discontinued at last hospital discharge on 05/11/2018 Current orders for Inpatient glycemic control: Lantus 15 units daily, Novoog 0-15 units TID with meals  Inpatient Diabetes Program Recommendations: Insulin - Basal: Noted patient received Lantus 5 units at 00:05 am today and is now ordered Lantus 15 units daily. HgbA1C: A1C 10% on 07/18/18. Last A1C was 5.2% on 05/01/2018.   NOTE: Noted consult for Diabetes Coordinator. Diabetes Coordinator is not on campus over the weekend but available by pager from 8am to 5pm for questions or concerns. Chart reviewed. Noted patient was in the hospital from 05/01/18 to 05/11/18 and per H&P on 05/01/18 home medications were Lantus 24 units daily and Metformin 500 mg BID. Per discharge summary on 05/11/18, Lantus and Metformin was discontinued. Prior A1C was 5.2% on 05/01/18. Initial glucose 1121 mg/dl on labs on 07/17/18 and current A1C 10% on 07/18/18. Anticipate  patient needs to be restarted on DM medications as an outpatient. Spoke with patient over the phone and she confirms that she was taking Lantus and Metformin but states that she was instructed to stop taking Lantus and Metformin at last hospital discharge on 05/11/18. Inquired about glucose monitoring and patient reports that she was told she did not have DM anymore and she did not need to take any DM medications or monitor glucose any longer. Explained that prior A1C was 5.2% on 05/01/18 and current A1C is 10% indicating an average glucose of 240 mg/dl. Also initial glucose was 1121 mg/dl on 07/17/18. Discussed DM and explained that she has DM2. Explained that DM2 can be diet controlled for some people and other may need DM medications for DM control.  Explained that patient will need to take DM medications as an outpatient and that glucose trends would be followed and MD would discharge her on appropriate medications. Patient verbalized understanding of information and states that she has no questions at this time related to DM.   Will continue to follow along while inpatient.  Thanks, Barnie Alderman, RN, MSN, CDE Diabetes Coordinator Inpatient Diabetes Program [phone] (Team Pager from 8am to 5pm)

## 2018-07-18 NOTE — Progress Notes (Signed)
Gloria [name] is a 68 y.o. female patient admitted from ED awake, alert - oriented  X 4. VSS - Blood pressure 130/60, pulse 81, temperature (!) 97.5 F (36.4 C), temperature source Oral, resp. rate 18, height 5\' 7"  (1.702 m), weight 75.9 kg, SpO2 99 %.    IV in place, occlusive dsg intact without redness.  Orientation to room, and floor completed.  Patient declined safety video at this time.  Admission INP armband ID verified with patient/family, and in place.   SR up x 2, fall assessment complete, with patient and family able to verbalize understanding of risk associated with falls, and verbalized understanding to call nsg before up out of bed.  Call light within reach, patient able to voice, and demonstrate understanding.  Skin, clean-dry- intact. Pt c/o chest pain 10/10. MD made aware.     Will cont to eval and treat per MD orders.  Valorie Roosevelt, RN 07/18/2018 2:39 AM

## 2018-07-18 NOTE — Plan of Care (Signed)
  Problem: Education: Goal: Knowledge of General Education information will improve Description: Including pain rating scale, medication(s)/side effects and non-pharmacologic comfort measures Outcome: Progressing   Problem: Safety: Goal: Ability to remain free from injury will improve Outcome: Progressing   Problem: Skin Integrity: Goal: Risk for impaired skin integrity will decrease Outcome: Progressing   

## 2018-07-18 NOTE — Progress Notes (Signed)
PROGRESS NOTE                                                                                                                                                                                                             Patient Demographics:    Gloria [name], is a 68 y.o. female, DOB - 1950-12-09, KGY:[id number]  Admit date - 07/17/2018   Admitting Physician Elwyn Reach, MD  Outpatient Primary MD for the patient is Biagio Borg, MD  LOS - 1  Chief Complaint  Patient presents with  . Weakness  . Chest Pain       Brief Narrative   Gloria [name] is a 68 y.o. female with medical history significant of diet-controlled diabetes, coronary artery disease, liver cirrhosis, hypertension, diastolic CHF who apparently has been eating and drinking whatever she wanted.  She has not been taking any medication for her diabetes, the last few weeks patient has been getting gradually weak, blurry vision and finally developed some chest pain and had a near syncopal episode, she came to the ER where her blood glucose was greater than 1000, she was admitted for hyperosmolar nonketotic state and admitted to the hospital.   Subjective:    Gloria [name] today has, No headache, No chest pain, No abdominal pain - No Nausea, No new weakness tingling or numbness, No Cough - SOB.    Assessment  & Plan :     1.HONK in DM2 -she had diagnosis of DM type II but was on diet control, clearly upon arrival her blood sugars were close to 1000, she will be adequately hydrated as she is profoundly dehydrated, hold her diuretic, place her on Lantus along with sliding scale, diabetic and insulin education.  Advance activity.  Likely discharge tomorrow.  Lab Results  Component Value Date   HGBA1C 5.2 05/01/2018   CBG (last 3)  Recent Labs    07/18/18 0006 07/18/18 0404 07/18/18 0806  GLUCAP 125* 208* 131*    2.  Gradually improving blurry vision with nonspecific chest discomfort.  Blurry vision due to  poorly controlled DM type II, it is improving with hydration and glycemic control, chest pain noncardiac likely due to reflux, stable EKG and negative troponin.  Chest pain-free.  Continue to monitor with supportive care.  Will place her on high intensity statin tomorrow due to CAD equivalent DM type II.  Check lipid panel prior to giving statin.  3.  Essential hypertension.  On combination of Coreg along with  Aldactone and Entresto which will be continued.  4.  Hypokalemia.  Replaced.  5.  Chronic severe systolic dysfunction.  EF 25%.  Under the care of Dr. Gwenlyn Found, continue combination of Entresto, Coreg and Aldactone.  Currently dehydrated.  She was noticed with possible viral myocarditis.  Repeat echocardiogram to evaluate function.  6.  OSA.  Does not use CPAP at home.  Monitor.    Family Communication  : None  Code Status :  Full  Disposition Plan  :  Home 1-2 days  Consults  :  DM educator  Procedures  :    DVT Prophylaxis  :  Lovenox    Lab Results  Component Value Date   PLT 252 07/18/2018    Diet :  Diet Order            DIET SOFT Room service appropriate? Yes; Fluid consistency: Thin  Diet effective now               Inpatient Medications Scheduled Meds: . carvedilol  3.125 mg Oral BID WC  . enoxaparin (LOVENOX) injection  40 mg Subcutaneous Q24H  . feeding supplement (ENSURE ENLIVE)  237 mL Oral BID BM  . insulin aspart  0-15 Units Subcutaneous Q4H  . potassium chloride  20 mEq Oral Daily  . sacubitril-valsartan  1 tablet Oral BID  . spironolactone  25 mg Oral Daily   Continuous Infusions: . sodium chloride     PRN Meds:.acetaminophen **OR** [DISCONTINUED] acetaminophen, nitroGLYCERIN, ondansetron **OR** ondansetron (ZOFRAN) IV  Antibiotics  :   Anti-infectives (From admission, onward)   None          Objective:   Vitals:   07/17/18 2029 07/18/18 0510 07/18/18 0600 07/18/18 0925  BP:  (!) 109/48 (!) 109/48 134/66  Pulse:  100 (!) 103 99    Resp:  17 18 18   Temp:   99.2 F (37.3 C)   TempSrc:   Oral   SpO2:  97% 98% 97%  Weight: 75.9 kg     Height: 5\' 7"  (1.702 m)       Wt Readings from Last 3 Encounters:  07/17/18 75.9 kg  06/29/18 75.7 kg  05/19/18 69.9 kg     Intake/Output Summary (Last 24 hours) at 07/18/2018 1103 Last data filed at 07/18/2018 1019 [name] per 24 hour  Intake 2364.98 ml  Output 600 ml  Net 1764.98 ml     Physical Exam  Awake Alert, Oriented X 3, No new F.N deficits, Normal affect [place].AT,PERRAL Supple Neck,No JVD, No cervical lymphadenopathy appriciated.  Symmetrical Chest wall movement, Good air movement bilaterally, CTAB RRR,No Gallops,Rubs or new Murmurs, No Parasternal Heave +ve B.Sounds, Abd Soft, No tenderness, No organomegaly appriciated, No rebound - guarding or rigidity. No Cyanosis, Clubbing or edema, No new Rash or bruise       Data Review:    CBC Recent Labs  Lab 07/17/18 1614 07/17/18 1711 07/17/18 2255 07/18/18 0255  WBC  --  9.4 13.9* 13.3*  HGB 12.2 11.1* 11.5* 11.2*  HCT 36.0 33.0* 32.2* 31.5*  PLT  --  259 288 252  MCV  --  84.6 81.7 81.8  MCH  --  28.5 29.2 29.1  MCHC  --  33.6 35.7 35.6  RDW  --  13.0 12.6 12.6  LYMPHSABS  --  0.7  --   --   MONOABS  --  0.5  --   --   EOSABS  --  0.2  --   --  BASOSABS  --  0.1  --   --     Chemistries  Recent Labs  Lab 07/17/18 1614 07/17/18 1711 07/17/18 2255 07/18/18 0255 07/18/18 0929  NA 119* 119* 136 136  135 133*  K 3.3* 3.3* 3.0* 3.3*  3.3* 3.3*  CL 81* 80* 97* 98  97* 97*  CO2  --  24 21* 21*  23 21*  GLUCOSE >700* 1,121* 82 144*  143* 271*  BUN 19 18 14 15  14 14   CREATININE 1.00 1.31* 0.88 0.70  0.81 0.90  CALCIUM  --  9.7 10.2 9.7  9.6 9.0  AST  --  25  --   --   --   ALT  --  26  --   --   --   ALKPHOS  --  169*  --   --   --   BILITOT  --  2.3*  --   --   --    ------------------------------------------------------------------------------------------------------------------ No  results for input(s): CHOL, HDL, LDLCALC, TRIG, CHOLHDL, LDLDIRECT in the last 72 hours.  Lab Results  Component Value Date   HGBA1C 5.2 05/01/2018   ------------------------------------------------------------------------------------------------------------------ No results for input(s): TSH, T4TOTAL, T3FREE, THYROIDAB in the last 72 hours.  Invalid input(s): FREET3 ------------------------------------------------------------------------------------------------------------------ No results for input(s): VITAMINB12, FOLATE, FERRITIN, TIBC, IRON, RETICCTPCT in the last 72 hours.  Coagulation profile No results for input(s): INR, PROTIME in the last 168 hours.  No results for input(s): DDIMER in the last 72 hours.  Cardiac Enzymes Recent Labs  Lab 07/17/18 2255 07/18/18 0255 07/18/18 0929  TROPONINI <0.03 <0.03 <0.03   ------------------------------------------------------------------------------------------------------------------    Component Value Date/Time   BNP 74.1 07/17/2018 1711    Micro Results Recent Results (from the past 240 hour(s))  MRSA PCR Screening     Status: None   Collection Time: 07/17/18  9:39 PM  Result Value Ref Range Status   MRSA by PCR NEGATIVE NEGATIVE Final    Comment:        The GeneXpert MRSA Assay (FDA approved for NASAL specimens only), is one component of a comprehensive MRSA colonization surveillance program. It is not intended to diagnose MRSA infection nor to guide or monitor treatment for MRSA infections. Performed at [facility], [place] [address], [place], [place] [address]     Radiology Reports Dg Chest 2 View  Result Date: 07/17/2018 CLINICAL DATA:  Chest pain EXAM: CHEST - 2 VIEW COMPARISON:  05/01/2018 FINDINGS: The heart size and mediastinal contours are within normal limits. Both lungs are clear. The visualized skeletal structures are unremarkable. IMPRESSION: No active cardiopulmonary disease. Electronically  Signed   By: Kathreen Devoid   On: 07/17/2018 17:10    Time Spent in minutes  30   Lala Lund M.D on 07/18/2018 at 11:03 AM  To page go to [email or web address] - password [facility]

## 2018-07-19 LAB — BASIC METABOLIC PANEL
Anion gap: 10 (ref 5–15)
BUN: 9 mg/dL (ref 8–23)
CO2: 26 mmol/L (ref 22–32)
Calcium: 9 mg/dL (ref 8.9–10.3)
Chloride: 102 mmol/L (ref 98–111)
Creatinine, Ser: 0.57 mg/dL (ref 0.44–1.00)
GFR calc Af Amer: >60 mL/min (ref >60)
GFR calc non Af Amer: >60 mL/min (ref >60)
Glucose, Bld: 246 mg/dL — ABNORMAL HIGH (ref 70–99)
Potassium: 3.4 mmol/L — ABNORMAL LOW (ref 3.5–5.1)
Sodium: 138 mmol/L (ref 135–145)

## 2018-07-19 LAB — CBC
HCT: 30.6 % — ABNORMAL LOW (ref 36.0–46.0)
Hemoglobin: 10.5 g/dL — ABNORMAL LOW (ref 12.0–15.0)
MCH: 28.9 pg (ref 26.0–34.0)
MCHC: 34.3 g/dL (ref 30.0–36.0)
MCV: 84.3 fL (ref 80.0–100.0)
Platelets: 243 10*3/uL (ref 150–400)
RBC: 3.63 MIL/uL — ABNORMAL LOW (ref 3.87–5.11)
RDW: 13.2 % (ref 11.5–15.5)
WBC: 9 10*3/uL (ref 4.0–10.5)
nRBC: 0 % (ref 0.0–0.2)

## 2018-07-19 LAB — LIPID PANEL
Cholesterol: 156 mg/dL (ref 0–200)
HDL: 39 mg/dL — ABNORMAL LOW (ref >40)
LDL Cholesterol: 80 mg/dL (ref 0–99)
Total CHOL/HDL Ratio: 4 RATIO
Triglycerides: 187 mg/dL — ABNORMAL HIGH (ref <150)
VLDL: 37 mg/dL (ref 0–40)

## 2018-07-19 LAB — MAGNESIUM: Magnesium: 1.7 mg/dL (ref 1.7–2.4)

## 2018-07-19 LAB — GLUCOSE, CAPILLARY
Glucose-Capillary: 228 mg/dL — ABNORMAL HIGH (ref 70–99)
Glucose-Capillary: 348 mg/dL — ABNORMAL HIGH (ref 70–99)

## 2018-07-19 MED ORDER — INSULIN ASPART 100 UNIT/ML ~~LOC~~ SOLN
0.0000 [IU] | Freq: Three times a day (TID) | SUBCUTANEOUS | Status: DC
  Administered 2018-07-19: 7 [IU] via SUBCUTANEOUS
  Administered 2018-07-19: 3 [IU] via SUBCUTANEOUS

## 2018-07-19 MED ORDER — BLOOD GLUCOSE MONITOR KIT: PACK | 1 refills | Status: DC

## 2018-07-19 MED ORDER — INSULIN GLARGINE 100 UNIT/ML ~~LOC~~ SOLN
25.0000 [IU] | Freq: Every day | SUBCUTANEOUS | Status: DC
  Administered 2018-07-19: 25 [IU] via SUBCUTANEOUS
  Filled 2018-07-19: qty 0.25

## 2018-07-19 MED ORDER — INSULIN GLARGINE 100 UNIT/ML ~~LOC~~ SOLN: 25.0000 [IU] | Freq: Every day | SUBCUTANEOUS | 0 refills | Status: DC

## 2018-07-19 MED ORDER — INSULIN ASPART 100 UNIT/ML ~~LOC~~ SOLN: SUBCUTANEOUS | 0 refills | Status: DC

## 2018-07-19 MED ORDER — POTASSIUM CHLORIDE CRYS ER 20 MEQ PO TBCR
40.0000 meq | EXTENDED_RELEASE_TABLET | Freq: Once | ORAL | Status: AC
  Administered 2018-07-19: 40 meq via ORAL
  Filled 2018-07-19: qty 2

## 2018-07-19 MED ORDER — ATORVASTATIN CALCIUM 40 MG PO TABS: 40.0000 mg | ORAL_TABLET | Freq: Every day | ORAL | 0 refills | Status: DC

## 2018-07-19 MED ORDER — INSULIN GLARGINE 100 UNIT/ML ~~LOC~~ SOLN
30.0000 [IU] | Freq: Every day | SUBCUTANEOUS | Status: DC
  Filled 2018-07-19: qty 0.3

## 2018-07-19 MED ORDER — "INSULIN SYRINGE-NEEDLE U-100 25G X 1"" 1 ML MISC": 0 refills | Status: DC

## 2018-07-19 MED ORDER — LIVING WELL WITH DIABETES BOOK
Freq: Once | Status: AC
  Administered 2018-07-19: 13:00:00
  Filled 2018-07-19: qty 1

## 2018-07-19 MED ORDER — INSULIN ASPART 100 UNIT/ML ~~LOC~~ SOLN: 0.0000 [IU] | Freq: Every day | SUBCUTANEOUS | Status: DC

## 2018-07-19 NOTE — Discharge Summary (Signed)
Gloria [name] EML:[id number] DOB: [birth year] DOA: 07/17/2018  PCP: Biagio Borg, MD  Admit date: 07/17/2018  Discharge date: 07/19/2018  Admitted From: Home   Disposition:  Home   Recommendations for Outpatient Follow-up:   Follow up with PCP in 1-2 weeks  PCP Please obtain BMP/CBC, 2 view CXR in 1week,  (see Discharge instructions)   PCP Please follow up on the following pending results:    Home Health: None   Equipment/Devices: None  Consultations: DM education Discharge Condition: STable   CODE STATUS: Ful   Diet Recommendation: Heart Healthy Low Carb    Chief Complaint  Patient presents with  . Weakness  . Chest Pain     Brief history of present illness from the day of admission and additional interim summary    Gloria [name] a 68 y.o.femalewith medical history significant ofdiet-controlled diabetes, coronary artery disease, liver cirrhosis, hypertension, diastolic CHF who apparently has been eating and drinking whatever she wanted. She has not been taking any medication for her diabetes, the last few weeks patient has been getting gradually weak, blurry vision and finally developed some chest pain and had a near syncopal episode, she came to the ER where her blood glucose was greater than 1000, she was admitted for hyperosmolar nonketotic state and admitted to the hospital.                                                                 Hospital Course    1.HONK in DM2 - she had diagnosis of DM type II but was on diet control, clearly upon arrival her blood sugars were close to 1000, she will be adequately hydrated as she was profoundly dehydrated, he has been replaced on Lantus and sliding scale, she has taken insulin in the past, she will be provided 1 month supply of insulin along with  glucometer and testing supplies, she is now back to baseline and eager to go home will be discharged with outpatient PCP follow-up in a week.  Testing instructions provided in writing.  Lab Results  Component Value Date   HGBA1C 10.0 (H) 07/18/2018   CBG (last 3)  Recent Labs    07/18/18 1828 07/18/18 2138 07/19/18 0820  GLUCAP 397* 175* 228*    2.  Gradually improving blurry vision with nonspecific chest discomfort.  Blurry vision due to poorly controlled DM type II, problem has completely resolved, chest pain noncardiac likely due to reflux, stable EKG and negative troponin.  Chest pain-free.  Continue to monitor with supportive care.  Echocardiogram shows improved EF and no wall motion abnormalities, continue home medications and add statin for CAD equivalent DM type II.  Lab Results  Component Value Date   CHOL 156 07/19/2018   HDL 39 (L) 07/19/2018   LDLCALC 80 07/19/2018  LDLDIRECT 64.0 10/03/2015   TRIG 187 (H) 07/19/2018   CHOLHDL 4.0 07/19/2018    3.  Essential hypertension.  On combination of Coreg along with Aldactone and Entresto which will be continued.  4.  Hypokalemia.  Replaced.  5.  Chronic severe systolic dysfunction.  EF 25%.  Under the care of Dr. Gwenlyn Found, continue combination of Entresto, Coreg and Aldactone.  Currently dehydrated.  She was noticed with possible viral myocarditis.  Repeat echocardiogram here shows improvement in EF to about 45% now, will have her follow with her primary cardiologist in 7 to 10 days after discharge as well.  6.  OSA.  Does not use CPAP at home.  PCP to monitor.   Discharge diagnosis     Principal Problem:   Hyperglycemia Active Problems:   Hyperlipidemia   Essential hypertension   Morbid obesity (HCC)   OSA (obstructive sleep apnea)   Syst congestive heart failure with reduced LV function, NYHA class 1 ([place])    Discharge instructions    Discharge Instructions    Diet - low sodium heart healthy   Complete  by:  As directed    Discharge instructions   Complete by:  As directed    Follow with Primary MD Biagio Borg, MD in 7 days   Get CBC , BMP checked  by Primary MD  in 5-7 days    Activity: As tolerated with Full fall precautions use walker/cane & assistance as needed  Disposition Home   Diet: Heart Healthy - Low Carb.  Accuchecks 4 times/day, Once in AM empty stomach and then before each meal. Log in all results and show them to your Prim.MD in 3 days. If any glucose reading is under 80 or above 300 call your Prim MD immidiately. Follow Low glucose instructions for glucose under 80 as instructed.   Special Instructions: If you have smoked or chewed Tobacco  in the last 2 yrs please stop smoking, stop any regular Alcohol  and or any Recreational drug use.  On your next visit with your primary care physician please Get Medicines reviewed and adjusted.  Please request your Prim.MD to go over all Hospital Tests and Procedure/Radiological results at the follow up, please get all Hospital records sent to your Prim MD by signing hospital release before you go home.  If you experience worsening of your admission symptoms, develop shortness of breath, life threatening emergency, suicidal or homicidal thoughts you must seek medical attention immediately by calling 911 or calling your MD immediately  if symptoms less severe.  You Must read complete instructions/literature along with all the possible adverse reactions/side effects for all the Medicines you take and that have been prescribed to you. Take any new Medicines after you have completely understood and accpet all the possible adverse reactions/side effects.   Increase activity slowly   Complete by:  As directed       Discharge Medications   Allergies as of 07/19/2018      Reactions   Compazine Other (See Comments)   Stroke like symptoms   Haloperidol Decanoate Other (See Comments)   Extrapyramidal syndrome   Penicillins Nausea  And Vomiting   Glipizide Other (See Comments)   dyspnea   Metformin And Related Other (See Comments)   GI upset   Codeine Itching   Lisinopril Cough      Medication List    TAKE these medications   atorvastatin 40 MG tablet Commonly known as:  LIPITOR Take 1 tablet (40 mg total)  by mouth daily.   blood glucose meter kit and supplies Kit Dispense based on patient and insurance preference. Use up to four times daily as directed. (FOR ICD-9 250.00, 250.01). For QAC - HS accuchecks.   camphor-menthol lotion Commonly known as:  SARNA Apply 1 application topically 2 (two) times daily.   carvedilol 3.125 MG tablet Commonly known as:  COREG Take 1 tablet (3.125 mg total) by mouth 2 (two) times daily with a meal.   feeding supplement (ENSURE ENLIVE) Liqd Take 237 mLs by mouth 2 (two) times daily between meals.   gabapentin 600 MG tablet Commonly known as:  NEURONTIN Take 0.5 tablets (300 mg total) by mouth 2 (two) times daily.   gabapentin 300 MG capsule Commonly known as:  NEURONTIN Take 1 capsule (300 mg total) by mouth 2 (two) times daily.   glucose blood test strip Commonly known as:  ACCU-CHEK AVIVA Use as instructed four times per day   ACCU-CHEK AVIVA PLUS test strip Generic drug:  glucose blood USE AS DIRECTED FOUR TIMES DAILY   insulin aspart 100 UNIT/ML injection Commonly known as:  NOVOLOG Substitute to any brand approved.Before each meal 3 times a day, 140-199 - 2 units, 200-250 - 4 units, 251-299 - 6 units,  300-349 - 8 units,  350 or above 10 units. Dispense syringes and needles as needed, Ok to switch to PEN if approved. DX DM2, Code E11.65   insulin glargine 100 UNIT/ML injection Commonly known as:  LANTUS Inject 0.25 mLs (25 Units total) into the skin at bedtime. Dispense insulin pen if approved, if not dispense as needed syringes and needles for 1 month supply. Can switch to Levemir. Diagnosis E 11.65.   Insulin Pen Needle 32G X 4 MM Misc Commonly known  as:  BD PEN NEEDLE NANO U/F USE ONCE DAILY   Insulin Syringe-Needle U-100 25G X 1" 1 ML Misc For 4 times a day insulin SQ, 1 month supply. Diagnosis E11.65   potassium chloride 10 MEQ tablet Commonly known as:  K-DUR Take 2 tablets (20 mEq total) by mouth daily.   sacubitril-valsartan 49-51 MG Commonly known as:  ENTRESTO Take 1 tablet by mouth 2 (two) times daily.   spironolactone 25 MG tablet Commonly known as:  ALDACTONE Take 1 tablet (25 mg total) by mouth daily.   torsemide 20 MG tablet Commonly known as:  DEMADEX Take 1 tablet (20 mg total) by mouth daily.       Follow-up Information    Biagio Borg, MD. Schedule an appointment as soon as possible for a visit in 1 week(s).   Specialties:  Internal Medicine, Radiology Contact information: [place] [address] [phone]        Skeet Latch, MD. Schedule an appointment as soon as possible for a visit in 1 week(s).   Specialty:  Cardiology Contact information: [address] [place] [address] [phone]           Major procedures and Radiology Reports - PLEASE review detailed and final reports thoroughly  -     TTE -     - Compared to a prior study in 05/2018, the LVEF has improved to  45-50%.   Dg Chest 2 View  Result Date: 07/17/2018 CLINICAL DATA:  Chest pain EXAM: CHEST - 2 VIEW COMPARISON:  05/01/2018 FINDINGS: The heart size and mediastinal contours are within normal limits. Both lungs are clear. The visualized skeletal structures are unremarkable. IMPRESSION: No active cardiopulmonary  disease. Electronically Signed   By: Kathreen Devoid   On: 07/17/2018 17:10    Micro Results     Recent Results (from the past 240 hour(s))  MRSA PCR Screening     Status: None   Collection Time: 07/17/18  9:39 PM  Result Value Ref Range Status   MRSA by PCR NEGATIVE NEGATIVE Final    Comment:        The GeneXpert MRSA Assay (FDA approved for NASAL  specimens only), is one component of a comprehensive MRSA colonization surveillance program. It is not intended to diagnose MRSA infection nor to guide or monitor treatment for MRSA infections. Performed at [facility], [place] [address], [place], [place] [address]     Today   Subjective    Vivica Dobosz today has no headache,no chest abdominal pain,no new weakness tingling or numbness, feels much better wants to go home today.     Objective   Blood pressure (!) 160/72, pulse 82, temperature 98 F (36.7 C), temperature source Oral, resp. rate 16, height '5\' 7"'  (1.702 m), weight 75.9 kg, SpO2 99 %.   Intake/Output Summary (Last 24 hours) at 07/19/2018 0912 Last data filed at 07/19/2018 0929 Gross per 24 hour  Intake 2045.68 ml  Output 2201 ml  Net -155.32 ml    Exam  Awake Alert, Oriented x 3, No new F.N deficits, Normal affect [place].AT,PERRAL Supple Neck,No JVD, No cervical lymphadenopathy appriciated.  Symmetrical Chest wall movement, Good air movement bilaterally, CTAB RRR,No Gallops,Rubs or new Murmurs, No Parasternal Heave +ve B.Sounds, Abd Soft, Non tender, No organomegaly appriciated, No rebound -guarding or rigidity. No Cyanosis, Clubbing or edema, No new Rash or bruise   Data Review   CBC w Diff:  Lab Results  Component Value Date   WBC 9.0 07/19/2018   HGB 10.5 (L) 07/19/2018   HCT 30.6 (L) 07/19/2018   PLT 243 07/19/2018   LYMPHOPCT 8 07/17/2018   MONOPCT 5 07/17/2018   EOSPCT 2 07/17/2018   BASOPCT 1 07/17/2018    CMP:  Lab Results  Component Value Date   NA 138 07/19/2018   NA 138 03/26/2017   K 3.4 (L) 07/19/2018   CL 102 07/19/2018   CO2 26 07/19/2018   BUN 9 07/19/2018   BUN 17 03/26/2017   CREATININE 0.57 07/19/2018   CREATININE 0.69 06/14/2016   PROT 8.3 (H) 07/17/2018   ALBUMIN 3.5 07/17/2018   BILITOT 2.3 (H) 07/17/2018   ALKPHOS 169 (H) 07/17/2018   AST 25 07/17/2018   ALT 26 07/17/2018  .   Total Time in preparing paper  work, data evaluation and todays exam - 44 minutes  Lala Lund M.D on 07/19/2018 at [place]  [phone]

## 2018-07-19 NOTE — Progress Notes (Signed)
Nsg Discharge Note  Admit Date:  07/17/2018 Discharge date: 07/19/2018   Gloria [name] to be D/C'd Home per MD order.  AVS completed.  Copy for chart, and copy for patient signed, and dated. Patient/caregiver able to verbalize understanding.  Discharge Medication: Allergies as of 07/19/2018      Reactions   Compazine Other (See Comments)   Stroke like symptoms   Haloperidol Decanoate Other (See Comments)   Extrapyramidal syndrome   Penicillins Nausea And Vomiting   Glipizide Other (See Comments)   dyspnea   Metformin And Related Other (See Comments)   GI upset   Codeine Itching   Lisinopril Cough      Medication List    TAKE these medications   atorvastatin 40 MG tablet Commonly known as:  LIPITOR Take 1 tablet (40 mg total) by mouth daily.   blood glucose meter kit and supplies Kit Dispense based on patient and insurance preference. Use up to four times daily as directed. (FOR ICD-9 250.00, 250.01). For QAC - HS accuchecks.   camphor-menthol lotion Commonly known as:  SARNA Apply 1 application topically 2 (two) times daily.   carvedilol 3.125 MG tablet Commonly known as:  COREG Take 1 tablet (3.125 mg total) by mouth 2 (two) times daily with a meal.   feeding supplement (ENSURE ENLIVE) Liqd Take 237 mLs by mouth 2 (two) times daily between meals.   gabapentin 600 MG tablet Commonly known as:  NEURONTIN Take 0.5 tablets (300 mg total) by mouth 2 (two) times daily.   gabapentin 300 MG capsule Commonly known as:  NEURONTIN Take 1 capsule (300 mg total) by mouth 2 (two) times daily.   glucose blood test strip Commonly known as:  ACCU-CHEK AVIVA Use as instructed four times per day   ACCU-CHEK AVIVA PLUS test strip Generic drug:  glucose blood USE AS DIRECTED FOUR TIMES DAILY   insulin aspart 100 UNIT/ML injection Commonly known as:  NOVOLOG Substitute to any brand approved.Before each meal 3 times a day, 140-199 - 2 units, 200-250 - 4 units, 251-299 - 6 units,   300-349 - 8 units,  350 or above 10 units. Dispense syringes and needles as needed, Ok to switch to PEN if approved. DX DM2, Code E11.65   insulin glargine 100 UNIT/ML injection Commonly known as:  LANTUS Inject 0.25 mLs (25 Units total) into the skin at bedtime. Dispense insulin pen if approved, if not dispense as needed syringes and needles for 1 month supply. Can switch to Levemir. Diagnosis E 11.65.   Insulin Pen Needle 32G X 4 MM Misc Commonly known as:  BD PEN NEEDLE NANO U/F USE ONCE DAILY   Insulin Syringe-Needle U-100 25G X 1" 1 ML Misc For 4 times a day insulin SQ, 1 month supply. Diagnosis E11.65   potassium chloride 10 MEQ tablet Commonly known as:  K-DUR Take 2 tablets (20 mEq total) by mouth daily.   sacubitril-valsartan 49-51 MG Commonly known as:  ENTRESTO Take 1 tablet by mouth 2 (two) times daily.   spironolactone 25 MG tablet Commonly known as:  ALDACTONE Take 1 tablet (25 mg total) by mouth daily.   torsemide 20 MG tablet Commonly known as:  DEMADEX Take 1 tablet (20 mg total) by mouth daily.       Discharge Assessment: Vitals:   07/19/18 0630 07/19/18 0704  BP: (!) 166/81 (!) 160/72  Pulse: 82   Resp: 16   Temp: 98 F (36.7 C)   SpO2: 99%  Skin clean, dry and intact without evidence of skin break down, no evidence of skin tears noted. IV catheter discontinued intact. Site without signs and symptoms of complications - no redness or edema noted at insertion site, patient denies c/o pain - only slight tenderness at site.  Dressing with slight pressure applied.  D/c Instructions-Education: Discharge instructions given to patient/family with verbalized understanding. D/c education completed with patient/family including follow up instructions, medication list, d/c activities limitations if indicated, with other d/c instructions as indicated by MD - patient able to verbalize understanding, all questions fully answered. Patient instructed to return to  ED, call 911, or call MD for any changes in condition.  Patient escorted via [place], and D/C home via private auto.  Sharan Mcenaney Margaretha Sheffield, RN 07/19/2018 12:11 PM

## 2018-07-19 NOTE — Discharge Instructions (Signed)
Follow with Primary MD Biagio Borg, MD in 7 days   Get CBC , BMP checked  by Primary MD  in 5-7 days    Activity: As tolerated with Full fall precautions use walker/cane & assistance as needed  Disposition Home   Diet: Heart Healthy - Low Carb.  Accuchecks 4 times/day, Once in AM empty stomach and then before each meal. Log in all results and show them to your Prim.MD in 3 days. If any glucose reading is under 80 or above 300 call your Prim MD immidiately. Follow Low glucose instructions for glucose under 80 as instructed.   Special Instructions: If you have smoked or chewed Tobacco  in the last 2 yrs please stop smoking, stop any regular Alcohol  and or any Recreational drug use.  On your next visit with your primary care physician please Get Medicines reviewed and adjusted.  Please request your Prim.MD to go over all Hospital Tests and Procedure/Radiological results at the follow up, please get all Hospital records sent to your Prim MD by signing hospital release before you go home.  If you experience worsening of your admission symptoms, develop shortness of breath, life threatening emergency, suicidal or homicidal thoughts you must seek medical attention immediately by calling 911 or calling your MD immediately  if symptoms less severe.  You Must read complete instructions/literature along with all the possible adverse reactions/side effects for all the Medicines you take and that have been prescribed to you. Take any new Medicines after you have completely understood and accpet all the possible adverse reactions/side effects.

## 2018-07-20 ENCOUNTER — Telehealth: Payer: Self-pay | Admitting: *Deleted

## 2018-07-20 NOTE — Telephone Encounter (Signed)
Transition Care Management Follow-up Telephone Call   Date discharged? 07/19/18   How have you been since you were released from the hospital? Pt states she is doing alright.. just tired   Do you understand why you were in the hospital? YES   Do you understand the discharge instructions? YES   Where were you discharged to? Home   Items Reviewed:  Medications reviewed: YES  Allergies reviewed: YES  Dietary changes reviewed: YES, heart healthy and low carb diet  Referrals reviewed: No referral needed   Functional Questionnaire:   Activities of Daily Living (ADLs):   She states she are independent in the following: ambulation, bathing and hygiene, feeding, continence, grooming, toileting and dressing States she doesn't require assistance    Any transportation issues/concerns?: NO   Any patient concerns? NO   Confirmed importance and date/time of follow-up visits scheduled YES, appt 07/22/18  Provider Appointment booked with Dr. Jenny Reichmann   Confirmed with patient if condition begins to worsen call PCP or go to the ER.  Patient was given the office number and encouraged to call back with question or concerns.  : YES

## 2018-07-21 ENCOUNTER — Ambulatory Visit (HOSPITAL_COMMUNITY)
Admission: RE | Admit: 2018-07-21 | Discharge: 2018-07-21 | Disposition: A | Discharge status: Home / Self Care | Payer: No Typology Code available for payment source | Source: Ambulatory Visit | Attending: Cardiology | Admitting: Cardiology

## 2018-07-21 DIAGNOSIS — I5022 Chronic systolic (congestive) heart failure: Secondary | ICD-10-CM

## 2018-07-22 ENCOUNTER — Encounter: Payer: Self-pay | Admitting: Internal Medicine

## 2018-07-22 ENCOUNTER — Ambulatory Visit (INDEPENDENT_AMBULATORY_CARE_PROVIDER_SITE_OTHER): Payer: Medicare Other | Admitting: Internal Medicine

## 2018-07-22 VITALS — BP 126/82 | HR 92 | Temp 97.9°F | Ht 67.0 in | Wt 165.0 lb

## 2018-07-22 DIAGNOSIS — E1140 Type 2 diabetes mellitus with diabetic neuropathy, unspecified: Secondary | ICD-10-CM

## 2018-07-22 DIAGNOSIS — E1165 Type 2 diabetes mellitus with hyperglycemia: Secondary | ICD-10-CM

## 2018-07-22 DIAGNOSIS — G4700 Insomnia, unspecified: Secondary | ICD-10-CM

## 2018-07-22 DIAGNOSIS — Z794 Long term (current) use of insulin: Secondary | ICD-10-CM

## 2018-07-22 DIAGNOSIS — IMO0002 Reserved for concepts with insufficient information to code with codable children: Secondary | ICD-10-CM

## 2018-07-22 DIAGNOSIS — G63 Polyneuropathy in diseases classified elsewhere: Secondary | ICD-10-CM

## 2018-07-22 MED ORDER — GABAPENTIN 100 MG PO CAPS: 100.0000 mg | ORAL_CAPSULE | Freq: Three times a day (TID) | ORAL | 1 refills | Status: DC

## 2018-07-22 MED ORDER — ALPRAZOLAM 1 MG PO TABS: 1.0000 mg | ORAL_TABLET | Freq: Every evening | ORAL | 1 refills | Status: DC | PRN

## 2018-07-22 MED ORDER — POTASSIUM CHLORIDE ER 10 MEQ PO TBCR: 10.0000 meq | EXTENDED_RELEASE_TABLET | Freq: Two times a day (BID) | ORAL | 3 refills | Status: DC

## 2018-07-22 NOTE — Assessment & Plan Note (Signed)
[place] for xanax qhs prn,  to f/u any worsening symptoms or concerns

## 2018-07-22 NOTE — Patient Instructions (Signed)
Please take all new medication as prescribed - the xanax at bedtime if needed, the gabapentin at 100 mg three times per day, and the new kind of potassium pill  Please call in 1 week if you feel like you would want the gabapentin at 300 mg three times per day  You will be contacted regarding the referral for: Dr Cruzita Lederer  Please continue all other medications as before, and refills have been done if requested.  Please have the pharmacy call with any other refills you may need.  Please continue your efforts at being more active, low cholesterol diet, and weight control  Please keep your appointments with your specialists as you may have planned  Please return in 6 months, or sooner if needed

## 2018-07-22 NOTE — Assessment & Plan Note (Signed)
With pain, ok to start the gabapentin 100 tid, consider increase to 300 if not improved in 1 wk

## 2018-07-22 NOTE — Assessment & Plan Note (Signed)
Improved, cont same tx, for endo referral

## 2018-07-22 NOTE — Progress Notes (Signed)
Subjective:    Patient ID: Gloria [name], female    DOB: 12-14-1950, 68 y.o.   MRN: [id number]  HPI  68 y.o.femalewith medical history significant ofdiet-controlled diabetes, coronary artery disease, liver cirrhosis, hypertension, diastolic CHF who apparently has been eating and drinking whatever she wanted. She has not been taking any medication for her diabetes,the last few weeks patient has been getting gradually weak, blurry vision and finally developed some chest pain and had a near syncopal episode, she came to the ER 1/17 where her blood glucose was greater than 1000, she was admitted for hyperosmolar nonketotic state and admitted to the hospital to 1/19.  Has been seen per endo a few yrs ago, states she was told she did not need insulin as her a1c was well controlled, but has not followed up. Has now also ongoing marked bilat feet burning pain worse to try to get to sleep at night, asks for tx, and has not yet started gabapentin, Has no f/u appt with endo Dr Renne Crigler for now.  Plans to call for eye exam soon  Also with difficulty getting to sleep most nights, asks for xanax which has worked well before.  Also c/o her potassium Kdur pill is difficult to take, asking for a different pill  Has been per cardiology yesterday with labs, declines further labs today Past Medical History:  Diagnosis Date  . Allergic rhinitis, cause unspecified 03/30/2013  . Anxiety state, unspecified   . Arthritis    "knees" (05/01/2018)  . Asthma    hx (05/01/2018)  . CHF (congestive heart failure) ([place])   . Chronic bronchitis   . Depressive disorder, not elsewhere classified   . Difficulty waking    hard to wake up past sedation!  . Migraine, unspecified, without mention of intractable migraine without mention of status migrainosus   . OSA (obstructive sleep apnea) 12/25/2016  . Other and unspecified hyperlipidemia   . Panic attacks   . Postmenopausal symptoms   . Type 2 diabetes, diet controlled ([place])   .  Unspecified essential hypertension   . Unspecified urinary incontinence   . UTI (lower urinary tract infection)    on 02/05/16/on meds   Past Surgical History:  Procedure Laterality Date  . ABDOMINAL HYSTERECTOMY  2002  . DIAGNOSTIC LAPAROSCOPY    . IR PARACENTESIS  05/08/2018  . OPEN REDUCTION SHOULDER DISLOCATION Right   . RIGHT/LEFT HEART CATH AND CORONARY ANGIOGRAPHY N/A 05/05/2018   Procedure: RIGHT/LEFT HEART CATH AND CORONARY ANGIOGRAPHY;  Surgeon: Belva Crome, MD;  Location: [facility];  Service: Cardiovascular;  Laterality: N/A;  . TUBAL LIGATION    . TUMOR EXCISION Right    "ankle"    reports that she has never smoked. She has never used smokeless tobacco. She reports that she does not drink alcohol or use drugs. family history includes Arthritis in her unknown relative; Asthma in her son; Colon cancer in her maternal grandfather; Diabetes in her maternal grandmother, maternal uncle, and paternal uncle; Hypertension in her mother and unknown relative; Rheum arthritis in her mother. Allergies  Allergen Reactions  . Compazine Other (See Comments)    Stroke like symptoms  . Haloperidol Decanoate Other (See Comments)    Extrapyramidal syndrome  . Penicillins Nausea And Vomiting  . Glipizide Other (See Comments)    dyspnea  . Metformin And Related Other (See Comments)    GI upset  . Codeine Itching  . Lisinopril Cough   Current Outpatient Medications on File Prior  to Visit  Medication Sig Dispense Refill  . ACCU-CHEK AVIVA PLUS test strip USE AS DIRECTED FOUR TIMES DAILY 100 each 2  . atorvastatin (LIPITOR) 40 MG tablet Take 1 tablet (40 mg total) by mouth daily. 30 tablet 0  . blood glucose meter kit and supplies KIT Dispense based on patient and insurance preference. Use up to four times daily as directed. (FOR ICD-9 250.00, 250.01). For QAC - HS accuchecks. 1 each 1  . camphor-menthol (SARNA) lotion Apply 1 application topically 2 (two) times daily. 222 mL 0  .  carvedilol (COREG) 3.125 MG tablet Take 1 tablet (3.125 mg total) by mouth 2 (two) times daily with a meal. 60 tablet 11  . feeding supplement, ENSURE ENLIVE, (ENSURE ENLIVE) LIQD Take 237 mLs by mouth 2 (two) times daily between meals. 7 Bottle 0  . glucose blood (ACCU-CHEK AVIVA) test strip Use as instructed four times per day 300 each 11  . insulin aspart (NOVOLOG) 100 UNIT/ML injection Substitute to any brand approved.Before each meal 3 times a day, 140-199 - 2 units, 200-250 - 4 units, 251-299 - 6 units,  300-349 - 8 units,  350 or above 10 units. Dispense syringes and needles as needed, Ok to switch to PEN if approved. DX DM2, Code E11.65 1 vial 0  . insulin glargine (LANTUS) 100 UNIT/ML injection Inject 0.25 mLs (25 Units total) into the skin at bedtime. Dispense insulin pen if approved, if not dispense as needed syringes and needles for 1 month supply. Can switch to Levemir. Diagnosis E 11.65. 10 mL 0  . Insulin Pen Needle (BD PEN NEEDLE NANO U/F) 32G X 4 MM MISC USE ONCE DAILY 100 each 3  . Insulin Syringe-Needle U-100 25G X 1" 1 ML MISC For 4 times a day insulin SQ, 1 month supply. Diagnosis E11.65 30 each 0  . sacubitril-valsartan (ENTRESTO) 49-51 MG Take 1 tablet by mouth 2 (two) times daily. 60 tablet 6  . spironolactone (ALDACTONE) 25 MG tablet Take 1 tablet (25 mg total) by mouth daily. 30 tablet 11  . torsemide (DEMADEX) 20 MG tablet Take 1 tablet (20 mg total) by mouth daily. 30 tablet 11   No current facility-administered medications on file prior to visit.    Review of Systems  Constitutional: Negative for other unusual diaphoresis or sweats HENT: Negative for ear discharge or swelling Eyes: Negative for other worsening visual disturbances Respiratory: Negative for stridor or other swelling  Gastrointestinal: Negative for worsening distension or other blood Genitourinary: Negative for retention or other urinary change Musculoskeletal: Negative for other MSK pain or  swelling Skin: Negative for color change or other new lesions Neurological: Negative for worsening tremors and other numbness  Psychiatric/Behavioral: Negative for worsening agitation or other fatigue All other system neg per pt    Objective:   Physical Exam BP 126/82   Pulse 92   Temp 97.9 F (36.6 C) (Oral)   Ht 5' 7" (1.702 m)   Wt 165 lb (74.8 kg)   SpO2 98%   BMI 25.84 kg/m  VS noted,  Constitutional: Pt is oriented to person, place, and time. Appears well-developed and well-nourished, in no significant distress and comfortable Head: Normocephalic and atraumatic  Eyes: Conjunctivae and EOM are normal. Pupils are equal, round, and reactive to light Right Ear: External ear normal without discharge Left Ear: External ear normal without discharge Nose: Nose without discharge or deformity Mouth/Throat: Oropharynx is without other ulcerations and moist  Neck: Normal range of motion. Neck supple.  No JVD present. No tracheal deviation present or significant neck LA or mass Cardiovascular: Normal rate, regular rhythm, normal heart sounds and intact distal pulses.   Pulmonary/Chest: WOB normal and breath sounds without rales or wheezing  Abdominal: Soft. Bowel sounds are normal. NT. No HSM  Musculoskeletal: Normal range of motion. Exhibits no edema Lymphadenopathy: Has no other cervical adenopathy.  Neurological: Pt is alert and oriented to person, place, and time. Pt has normal reflexes. No cranial nerve deficit. Motor grossly intact, Gait intact Skin: Skin is warm and dry. No rash noted or new ulcerations Psychiatric:  Has normal mood and affect. Behavior is normal without agitation No other exam findings  Lab Results  Component Value Date   WBC 9.0 07/19/2018   HGB 10.5 (L) 07/19/2018   HCT 30.6 (L) 07/19/2018   PLT 243 07/19/2018   GLUCOSE 246 (H) 07/19/2018   CHOL 156 07/19/2018   TRIG 187 (H) 07/19/2018   HDL 39 (L) 07/19/2018   LDLDIRECT 64.0 10/03/2015   LDLCALC 80  07/19/2018   ALT 26 07/17/2018   AST 25 07/17/2018   NA 138 07/19/2018   K 3.4 (L) 07/19/2018   CL 102 07/19/2018   CREATININE 0.57 07/19/2018   BUN 9 07/19/2018   CO2 26 07/19/2018   TSH 4.24 02/25/2018   INR 1.11 05/09/2018   HGBA1C 10.0 (H) 07/18/2018   MICROALBUR 6.4 (H) 02/25/2018        Assessment & Plan:

## 2018-08-17 ENCOUNTER — Other Ambulatory Visit: Payer: Self-pay

## 2018-08-17 ENCOUNTER — Encounter (HOSPITAL_COMMUNITY): Payer: Self-pay | Admitting: *Deleted

## 2018-08-17 ENCOUNTER — Emergency Department (HOSPITAL_COMMUNITY): Payer: Medicare Other

## 2018-08-17 ENCOUNTER — Ambulatory Visit: Payer: Self-pay

## 2018-08-17 ENCOUNTER — Inpatient Hospital Stay (HOSPITAL_COMMUNITY)
Admission: EM | Admit: 2018-08-17 | Discharge: 2018-08-24 | DRG: 638 | Disposition: A | Discharge status: Skilled Nursing Facility | Payer: Medicare Other | Attending: Internal Medicine | Admitting: Internal Medicine

## 2018-08-17 DIAGNOSIS — L299 Pruritus, unspecified: Secondary | ICD-10-CM | POA: Diagnosis present

## 2018-08-17 DIAGNOSIS — Z794 Long term (current) use of insulin: Secondary | ICD-10-CM

## 2018-08-17 DIAGNOSIS — Z825 Family history of asthma and other chronic lower respiratory diseases: Secondary | ICD-10-CM

## 2018-08-17 DIAGNOSIS — R945 Abnormal results of liver function studies: Secondary | ICD-10-CM | POA: Diagnosis not present

## 2018-08-17 DIAGNOSIS — Z880 Allergy status to penicillin: Secondary | ICD-10-CM | POA: Diagnosis not present

## 2018-08-17 DIAGNOSIS — K7460 Unspecified cirrhosis of liver: Secondary | ICD-10-CM

## 2018-08-17 DIAGNOSIS — J45909 Unspecified asthma, uncomplicated: Secondary | ICD-10-CM | POA: Diagnosis not present

## 2018-08-17 DIAGNOSIS — Z800 Family history of malignant neoplasm of digestive organs: Secondary | ICD-10-CM

## 2018-08-17 DIAGNOSIS — E1140 Type 2 diabetes mellitus with diabetic neuropathy, unspecified: Secondary | ICD-10-CM | POA: Diagnosis present

## 2018-08-17 DIAGNOSIS — Z885 Allergy status to narcotic agent status: Secondary | ICD-10-CM

## 2018-08-17 DIAGNOSIS — Z8261 Family history of arthritis: Secondary | ICD-10-CM

## 2018-08-17 DIAGNOSIS — D649 Anemia, unspecified: Secondary | ICD-10-CM | POA: Diagnosis present

## 2018-08-17 DIAGNOSIS — I959 Hypotension, unspecified: Secondary | ICD-10-CM | POA: Diagnosis not present

## 2018-08-17 DIAGNOSIS — R7401 Elevation of levels of liver transaminase levels: Secondary | ICD-10-CM

## 2018-08-17 DIAGNOSIS — R188 Other ascites: Secondary | ICD-10-CM | POA: Diagnosis present

## 2018-08-17 DIAGNOSIS — Z79899 Other long term (current) drug therapy: Secondary | ICD-10-CM

## 2018-08-17 DIAGNOSIS — E872 Acidosis, unspecified: Secondary | ICD-10-CM | POA: Diagnosis present

## 2018-08-17 DIAGNOSIS — F329 Major depressive disorder, single episode, unspecified: Secondary | ICD-10-CM | POA: Diagnosis present

## 2018-08-17 DIAGNOSIS — E1165 Type 2 diabetes mellitus with hyperglycemia: Secondary | ICD-10-CM | POA: Diagnosis not present

## 2018-08-17 DIAGNOSIS — R1011 Right upper quadrant pain: Secondary | ICD-10-CM | POA: Diagnosis not present

## 2018-08-17 DIAGNOSIS — E785 Hyperlipidemia, unspecified: Secondary | ICD-10-CM | POA: Diagnosis not present

## 2018-08-17 DIAGNOSIS — Z9111 Patient's noncompliance with dietary regimen: Secondary | ICD-10-CM

## 2018-08-17 DIAGNOSIS — E1100 Type 2 diabetes mellitus with hyperosmolarity without nonketotic hyperglycemic-hyperosmolar coma (NKHHC): Secondary | ICD-10-CM | POA: Diagnosis not present

## 2018-08-17 DIAGNOSIS — G4733 Obstructive sleep apnea (adult) (pediatric): Secondary | ICD-10-CM | POA: Diagnosis present

## 2018-08-17 DIAGNOSIS — M170 Bilateral primary osteoarthritis of knee: Secondary | ICD-10-CM | POA: Diagnosis not present

## 2018-08-17 DIAGNOSIS — F411 Generalized anxiety disorder: Secondary | ICD-10-CM | POA: Diagnosis not present

## 2018-08-17 DIAGNOSIS — N179 Acute kidney failure, unspecified: Secondary | ICD-10-CM | POA: Diagnosis present

## 2018-08-17 DIAGNOSIS — R740 Nonspecific elevation of levels of transaminase and lactic acid dehydrogenase [LDH]: Secondary | ICD-10-CM | POA: Diagnosis present

## 2018-08-17 DIAGNOSIS — I110 Hypertensive heart disease with heart failure: Secondary | ICD-10-CM | POA: Diagnosis not present

## 2018-08-17 DIAGNOSIS — Z888 Allergy status to other drugs, medicaments and biological substances status: Secondary | ICD-10-CM

## 2018-08-17 DIAGNOSIS — I10 Essential (primary) hypertension: Secondary | ICD-10-CM | POA: Diagnosis present

## 2018-08-17 DIAGNOSIS — E860 Dehydration: Secondary | ICD-10-CM | POA: Diagnosis not present

## 2018-08-17 DIAGNOSIS — M6281 Muscle weakness (generalized): Secondary | ICD-10-CM | POA: Diagnosis not present

## 2018-08-17 DIAGNOSIS — E876 Hypokalemia: Secondary | ICD-10-CM | POA: Diagnosis present

## 2018-08-17 DIAGNOSIS — I5042 Chronic combined systolic (congestive) and diastolic (congestive) heart failure: Secondary | ICD-10-CM | POA: Diagnosis not present

## 2018-08-17 DIAGNOSIS — Z86010 Personal history of colonic polyps: Secondary | ICD-10-CM

## 2018-08-17 DIAGNOSIS — Z8249 Family history of ischemic heart disease and other diseases of the circulatory system: Secondary | ICD-10-CM

## 2018-08-17 DIAGNOSIS — E871 Hypo-osmolality and hyponatremia: Secondary | ICD-10-CM | POA: Diagnosis not present

## 2018-08-17 DIAGNOSIS — F410 Panic disorder [episodic paroxysmal anxiety] without agoraphobia: Secondary | ICD-10-CM | POA: Diagnosis present

## 2018-08-17 DIAGNOSIS — E869 Volume depletion, unspecified: Secondary | ICD-10-CM | POA: Diagnosis present

## 2018-08-17 DIAGNOSIS — R7989 Other specified abnormal findings of blood chemistry: Secondary | ICD-10-CM | POA: Diagnosis present

## 2018-08-17 DIAGNOSIS — Z90710 Acquired absence of both cervix and uterus: Secondary | ICD-10-CM

## 2018-08-17 DIAGNOSIS — Z833 Family history of diabetes mellitus: Secondary | ICD-10-CM

## 2018-08-17 DIAGNOSIS — IMO0002 Reserved for concepts with insufficient information to code with codable children: Secondary | ICD-10-CM | POA: Diagnosis present

## 2018-08-17 LAB — CBC WITH DIFFERENTIAL/PLATELET
Abs Immature Granulocytes: 0.02 10*3/uL (ref 0.00–0.07)
Abs Immature Granulocytes: 0.01 10*3/uL (ref 0.00–0.07)
Basophils Absolute: 0.1 10*3/uL (ref 0.0–0.1)
Basophils Absolute: 0.1 10*3/uL (ref 0.0–0.1)
Basophils Relative: 1 %
Basophils Relative: 1 %
Eosinophils Absolute: 0.4 10*3/uL (ref 0.0–0.5)
Eosinophils Absolute: 0.4 10*3/uL (ref 0.0–0.5)
Eosinophils Relative: 5 %
Eosinophils Relative: 6 %
HCT: 31.4 % — ABNORMAL LOW (ref 36.0–46.0)
HCT: 29.8 % — ABNORMAL LOW (ref 36.0–46.0)
Hemoglobin: 10.2 g/dL — ABNORMAL LOW (ref 12.0–15.0)
Hemoglobin: 10 g/dL — ABNORMAL LOW (ref 12.0–15.0)
Immature Granulocytes: 0 %
Immature Granulocytes: 0 %
Lymphocytes Relative: 17 %
Lymphocytes Relative: 15 %
Lymphs Abs: 1.1 10*3/uL (ref 0.7–4.0)
Lymphs Abs: 0.9 10*3/uL (ref 0.7–4.0)
MCH: 29.1 pg (ref 26.0–34.0)
MCH: 29.9 pg (ref 26.0–34.0)
MCHC: 32.5 g/dL (ref 30.0–36.0)
MCHC: 33.6 g/dL (ref 30.0–36.0)
MCV: 89.5 fL (ref 80.0–100.0)
MCV: 89 fL (ref 80.0–100.0)
Monocytes Absolute: 0.4 10*3/uL (ref 0.1–1.0)
Monocytes Absolute: 0.4 10*3/uL (ref 0.1–1.0)
Monocytes Relative: 6 %
Monocytes Relative: 6 %
Neutro Abs: 4.7 10*3/uL (ref 1.7–7.7)
Neutro Abs: 4.6 10*3/uL (ref 1.7–7.7)
Neutrophils Relative %: 71 %
Neutrophils Relative %: 72 %
Platelets: 222 10*3/uL (ref 150–400)
Platelets: 218 10*3/uL (ref 150–400)
RBC: 3.51 MIL/uL — ABNORMAL LOW (ref 3.87–5.11)
RBC: 3.35 MIL/uL — ABNORMAL LOW (ref 3.87–5.11)
RDW: 13.2 % (ref 11.5–15.5)
RDW: 13.2 % (ref 11.5–15.5)
WBC: 6.7 10*3/uL (ref 4.0–10.5)
WBC: 6.4 10*3/uL (ref 4.0–10.5)
nRBC: 0 % (ref 0.0–0.2)
nRBC: 0 % (ref 0.0–0.2)

## 2018-08-17 LAB — CBG MONITORING, ED
Glucose-Capillary: 595 mg/dL (ref 70–99)
Glucose-Capillary: 506 mg/dL (ref 70–99)
Glucose-Capillary: 460 mg/dL — ABNORMAL HIGH (ref 70–99)

## 2018-08-17 LAB — COMPREHENSIVE METABOLIC PANEL
ALT: 495 U/L — ABNORMAL HIGH (ref 0–44)
AST: 362 U/L — ABNORMAL HIGH (ref 15–41)
Albumin: 3.3 g/dL — ABNORMAL LOW (ref 3.5–5.0)
Alkaline Phosphatase: 377 U/L — ABNORMAL HIGH (ref 38–126)
Anion gap: 15 (ref 5–15)
BUN: 37 mg/dL — ABNORMAL HIGH (ref 8–23)
CO2: 21 mmol/L — ABNORMAL LOW (ref 22–32)
Calcium: 9.6 mg/dL (ref 8.9–10.3)
Chloride: 92 mmol/L — ABNORMAL LOW (ref 98–111)
Creatinine, Ser: 1.85 mg/dL — ABNORMAL HIGH (ref 0.44–1.00)
GFR calc Af Amer: 32 mL/min — ABNORMAL LOW (ref >60)
GFR calc non Af Amer: 27 mL/min — ABNORMAL LOW (ref >60)
Glucose, Bld: 604 mg/dL (ref 70–99)
Potassium: 3.9 mmol/L (ref 3.5–5.1)
Sodium: 128 mmol/L — ABNORMAL LOW (ref 135–145)
Total Bilirubin: 1 mg/dL (ref 0.3–1.2)
Total Protein: 8.4 g/dL — ABNORMAL HIGH (ref 6.5–8.1)

## 2018-08-17 LAB — URINALYSIS, ROUTINE W REFLEX MICROSCOPIC
Bacteria, UA: NONE SEEN
Bilirubin Urine: NEGATIVE
Glucose, UA: >=500 mg/dL — AB
Hgb urine dipstick: NEGATIVE
Ketones, ur: NEGATIVE mg/dL
Leukocytes,Ua: NEGATIVE
Nitrite: NEGATIVE
Protein, ur: NEGATIVE mg/dL
Specific Gravity, Urine: 1.01 (ref 1.005–1.030)
pH: 6 (ref 5.0–8.0)

## 2018-08-17 LAB — AMMONIA: Ammonia: 32 umol/L (ref 9–35)

## 2018-08-17 LAB — LACTIC ACID, PLASMA: Lactic Acid, Venous: 2.5 mmol/L (ref 0.5–1.9)

## 2018-08-17 LAB — BRAIN NATRIURETIC PEPTIDE: B Natriuretic Peptide: 17.5 pg/mL (ref 0.0–100.0)

## 2018-08-17 MED ORDER — INSULIN REGULAR(HUMAN) IN NACL 100-0.9 UT/100ML-% IV SOLN
INTRAVENOUS | Status: DC
  Administered 2018-08-17: 4.5 [IU]/h via INTRAVENOUS
  Filled 2018-08-17: qty 100

## 2018-08-17 MED ORDER — ALPRAZOLAM 0.5 MG PO TABS
1.0000 mg | ORAL_TABLET | Freq: Every evening | ORAL | Status: DC | PRN
  Administered 2018-08-18 – 2018-08-23 (×7): 1 mg via ORAL
  Filled 2018-08-17: qty 2
  Filled 2018-08-17: qty 4
  Filled 2018-08-17 (×5): qty 2

## 2018-08-17 MED ORDER — CARVEDILOL 3.125 MG PO TABS
3.1250 mg | ORAL_TABLET | Freq: Two times a day (BID) | ORAL | Status: DC
  Administered 2018-08-18 – 2018-08-24 (×14): 3.125 mg via ORAL
  Filled 2018-08-17 (×15): qty 1

## 2018-08-17 MED ORDER — ONDANSETRON HCL 4 MG/2ML IJ SOLN
4.0000 mg | Freq: Four times a day (QID) | INTRAMUSCULAR | Status: DC | PRN
  Filled 2018-08-17: qty 2

## 2018-08-17 MED ORDER — POTASSIUM CHLORIDE 10 MEQ/100ML IV SOLN
10.0000 meq | INTRAVENOUS | Status: AC
  Administered 2018-08-17 (×2): 10 meq via INTRAVENOUS
  Filled 2018-08-17 (×2): qty 100

## 2018-08-17 MED ORDER — DEXTROSE 50 % IV SOLN: 50.0000 mL | INTRAVENOUS | Status: DC | PRN

## 2018-08-17 MED ORDER — HEPARIN SODIUM (PORCINE) 5000 UNIT/ML IJ SOLN
5000.0000 [IU] | Freq: Three times a day (TID) | INTRAMUSCULAR | Status: DC
  Administered 2018-08-18 – 2018-08-24 (×19): 5000 [IU] via SUBCUTANEOUS
  Filled 2018-08-17 (×19): qty 1

## 2018-08-17 MED ORDER — SODIUM CHLORIDE 0.9 % IV BOLUS
1000.0000 mL | Freq: Once | INTRAVENOUS | Status: AC
  Administered 2018-08-17: 1000 mL via INTRAVENOUS

## 2018-08-17 MED ORDER — SODIUM CHLORIDE 0.9 % IV SOLN
INTRAVENOUS | Status: DC
  Administered 2018-08-18: 01:00:00 via INTRAVENOUS

## 2018-08-17 MED ORDER — HYDRALAZINE HCL 20 MG/ML IJ SOLN: 5.0000 mg | INTRAMUSCULAR | Status: DC | PRN

## 2018-08-17 MED ORDER — GABAPENTIN 100 MG PO CAPS
100.0000 mg | ORAL_CAPSULE | Freq: Three times a day (TID) | ORAL | Status: DC
  Administered 2018-08-18 – 2018-08-24 (×21): 100 mg via ORAL
  Filled 2018-08-17 (×21): qty 1

## 2018-08-17 MED ORDER — ONDANSETRON HCL 4 MG PO TABS: 4.0000 mg | ORAL_TABLET | Freq: Four times a day (QID) | ORAL | Status: DC | PRN

## 2018-08-17 MED ORDER — SENNOSIDES-DOCUSATE SODIUM 8.6-50 MG PO TABS: 1.0000 | ORAL_TABLET | Freq: Every evening | ORAL | Status: DC | PRN

## 2018-08-17 NOTE — ED Notes (Signed)
Dr Miller at bedside. 

## 2018-08-17 NOTE — ED Triage Notes (Signed)
Pt recently admitted for weakness. Per son, pt has had generalized weakness for the past 1.5 week. Pt denies any pain. Noted to be hypotensive at present

## 2018-08-17 NOTE — Telephone Encounter (Signed)
Patient's son called and says the patient is not doing good. He says that her speech is slurred and has been slurred for about a week. He says that she has no desire to do anything for herself, she's weak and is in the bed. He says she's falling, so he purchased a walker for her to use to get around with. He says that she's been drinking sodas, tea, but no water. He says he had to get someone to go in the house to help her get a shower. He says she said that she's felt this way since she was in the office for a follow up and Dr. Jenny Reichmann increased some medication she was taking. I asked is the medication gabapentin, but he wasn't sure. I advised for him to take her to the ED or call 911. He says he is on the way to her house now and that she will not listen to what he has to say. I advised to call back, so either me or another TN could tell her the advice to go to the ED. I attempted to call the patient, but the phone went to a VM. Her son agrees to call back when he gets to the patient's home.   Reason for Disposition . [1] SEVERE weakness (i.e., unable to walk or barely able to walk, requires support) AND     [2] new onset or worsening  Answer Assessment - Initial Assessment Questions 1. DESCRIPTION: "Describe how you are feeling."     Weakness, falling  2. SEVERITY: "How bad is it?"  "Can you stand and walk?"   - MILD - Feels weak or tired, but does not interfere with work, school or normal activities   - [place] to stand and walk; weakness interferes with work, school, or normal activities   - SEVERE - Unable to stand or walk     Moderate to severe-she falls 3. ONSET:  "When did the weakness begin?"     She says since her last visit when a medication was increased 4. CAUSE: "What do you think is causing the weakness?"     I don't know, but she thinks it's medication 5. MEDICINES: "Have you recently started a new medicine or had a change in the amount of a medicine?"     Yes, gabapentin 6.  OTHER SYMPTOMS: "Do you have any other symptoms?" (e.g., chest pain, fever, cough, SOB, vomiting, diarrhea, bleeding, other areas of pain)     Slurred speech 7. PREGNANCY: "Is there any chance you are pregnant?" "When was your last menstrual period?"     N/A  Protocols used: WEAKNESS (GENERALIZED) AND FATIGUE-A-AH

## 2018-08-17 NOTE — ED Notes (Signed)
Patient given turkey sandwich and PO fluids.  

## 2018-08-17 NOTE — ED Provider Notes (Signed)
[facility] EMERGENCY DEPARTMENT Provider Note   CSN: [id number] Arrival date & time: 08/17/18  1926    History   Chief Complaint Chief Complaint  Patient presents with  . Chest Pain    HPI Gloria [name] is a 68 y.o. female.     HPI  Patient is a 68 year old female, she is very pleasant, she has a known history of diabetes and in fact had been diagnosed with a diabetic hyperosmolar state on January 17 when she was admitted to the hospital.  She stayed for several days and was discharged from the hospital.  She does have a history of chronic systolic congestive heart failure as well.  The patient has been admitted to the hospital in the past for other reasons including anasarca and fluid retention.  According to the son who keeps a close eye on his mother she does live independently, she is usually able to care for herself including driving, cooking for herself etc.  He reports that she loves to drink sugared drinks such as Sunkist and Sprite and drinks these excessively according to his report.  He also reports that she has been having some increased confusion.  He thinks that she is having some intermittent slurred speech and brought her to the hospital today because when he checked her blood sugar it read that it was too high to register.  The patient denies feeling poorly at all, she does admit that she has some slurred speech and weakness.  Review of the medical record shows that her last echocardiogram was on July 18, 2018 and showed an ejection fraction of 45 to 50% with a pattern of grade 1 diastolic dysfunction and diffuse hypokinesis.  When she was admitted to the hospital on January 17 her glucose was 1120, she had no anion gap.  Past Medical History:  Diagnosis Date  . Allergic rhinitis, cause unspecified 03/30/2013  . Anxiety state, unspecified   . Arthritis    "knees" (05/01/2018)  . Asthma    hx (05/01/2018)  . CHF (congestive heart failure) ([place])    . Chronic bronchitis   . Depressive disorder, not elsewhere classified   . Difficulty waking    hard to wake up past sedation!  . Migraine, unspecified, without mention of intractable migraine without mention of status migrainosus   . OSA (obstructive sleep apnea) 12/25/2016  . Other and unspecified hyperlipidemia   . Panic attacks   . Postmenopausal symptoms   . Type 2 diabetes, diet controlled ([place])   . Unspecified essential hypertension   . Unspecified urinary incontinence   . UTI (lower urinary tract infection)    on 02/05/16/on meds    Patient Active Problem List   Diagnosis Date Noted  . Hyperglycemia 07/17/2018  . Other ascites   . Acute systolic (congestive) heart failure ([place]) 05/03/2018  . Anasarca 05/03/2018  . 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 Fluid overload 05/01/2018  . Ascites of liver 03/05/2018  . Abdominal swelling, generalized 02/25/2018  . Mouth pain 02/02/2018  . Rash 02/02/2018  . Peripheral neuropathy 08/26/2017  . Non-intractable vomiting with nausea 05/01/2017  . Acute pain of right knee 04/02/2017  . Syst congestive heart failure with reduced LV function, NYHA class 1 ([place]) 03/19/2017  . OSA (obstructive sleep apnea) 12/25/2016  . Hypersomnolence 11/10/2016  . Positive ANA (antinuclear antibody) 07/11/2016  . Primary osteoarthritis of both hands 07/11/2016  . Fx sacrum/coccyx-closed ([place]) 07/11/2016  . Left arm pain 06/25/2016  .  Polyarthralgia 03/27/2016  . Degenerative arthritis of knee, bilateral 03/27/2016  . UTI (urinary tract infection) 02/28/2016  . Low back pain 02/28/2016  . Coccyxdynia 02/28/2016  . Bilateral knee pain 02/28/2016  . Bilateral ankle pain 02/28/2016  . Encounter for preventative adult health care exam with abnormal findings 10/03/2015  . Peripheral edema 10/03/2015  . Morbid obesity ([place]) 01/01/2015  . 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 Asthma, chronic 10/17/2014  . Dyspnea  03/29/2014  . Upper airway cough syndrome 03/29/2014  . Vocal cord dysfunction 03/21/2014  . Uncontrolled type 2 diabetes mellitus with diabetic neuropathy, without long-term current use of insulin ([place])   . Allergic rhinitis, cause unspecified 03/30/2013  . Insomnia 09/21/2012  . Cough 04/15/2012  . Menopause 02/28/2011  . Hot flashes, menopausal 02/28/2011  . Wheezing 11/02/2010  . Hyperlipidemia 06/18/2010  . ARTHRITIS 06/18/2010  . ANXIETY 06/14/2010  . DEPRESSION 06/14/2010  . Migraine 06/14/2010  . Essential hypertension 06/14/2010    Past Surgical History:  Procedure Laterality Date  . ABDOMINAL HYSTERECTOMY  2002  . DIAGNOSTIC LAPAROSCOPY    . IR PARACENTESIS  05/08/2018  . OPEN REDUCTION SHOULDER DISLOCATION Right   . RIGHT/LEFT HEART CATH AND CORONARY ANGIOGRAPHY N/A 05/05/2018   Procedure: RIGHT/LEFT HEART CATH AND CORONARY ANGIOGRAPHY;  Surgeon: Belva Crome, MD;  Location: [facility];  Service: Cardiovascular;  Laterality: N/A;  . TUBAL LIGATION    . TUMOR EXCISION Right    "ankle"     OB History    Gravida  1   Para  1   Term  1   Preterm      AB      Living  1     SAB      TAB      Ectopic      Multiple      Live Births               Home Medications    Prior to Admission medications   Medication Sig Start Date End Date Taking? Authorizing Provider  ACCU-CHEK AVIVA PLUS test strip USE AS DIRECTED FOUR TIMES DAILY 05/12/18   Biagio Borg, MD  ALPRAZolam Duanne Moron) 1 MG tablet Take 1 tablet (1 mg total) by mouth at bedtime as needed for anxiety. 07/22/18   Biagio Borg, MD  atorvastatin (LIPITOR) 40 MG tablet Take 1 tablet (40 mg total) by mouth daily. 07/19/18   Thurnell Lose, MD  blood glucose meter kit and supplies KIT Dispense based on patient and insurance preference. Use up to four times daily as directed. (FOR ICD-9 250.00, 250.01). For QAC - HS accuchecks. 07/19/18   Thurnell Lose, MD  camphor-menthol [facility]) lotion  Apply 1 application topically 2 (two) times daily. 05/11/18   Kayleen Memos, DO  carvedilol (COREG) 3.125 MG tablet Take 1 tablet (3.125 mg total) by mouth 2 (two) times daily with a meal. 05/18/18   Tillery, Satira Mccallum, PA-C  feeding supplement, ENSURE ENLIVE, (ENSURE ENLIVE) LIQD Take 237 mLs by mouth 2 (two) times daily between meals. 05/11/18   Kayleen Memos, DO  gabapentin (NEURONTIN) 100 MG capsule Take 1 capsule (100 mg total) by mouth 3 (three) times daily. 07/22/18   Biagio Borg, MD  glucose blood (ACCU-CHEK AVIVA) test strip Use as instructed four times per day 08/26/17   Philemon Kingdom, MD  insulin aspart (NOVOLOG) 100 UNIT/ML injection Substitute to any brand approved.Before each meal 3 times a  day, 140-199 - 2 units, 200-250 - 4 units, 251-299 - 6 units,  300-349 - 8 units,  350 or above 10 units. Dispense syringes and needles as needed, Ok to switch to PEN if approved. DX DM2, Code E11.65 07/19/18   Thurnell Lose, MD  insulin glargine (LANTUS) 100 UNIT/ML injection Inject 0.25 mLs (25 Units total) into the skin at bedtime. Dispense insulin pen if approved, if not dispense as needed syringes and needles for 1 month supply. Can switch to Levemir. Diagnosis E 11.65. 07/19/18   Thurnell Lose, MD  Insulin Pen Needle (BD PEN NEEDLE NANO U/F) 32G X 4 MM MISC USE ONCE DAILY 08/26/17   Philemon Kingdom, MD  Insulin Syringe-Needle U-100 25G X 1" 1 ML MISC For 4 times a day insulin SQ, 1 month supply. Diagnosis E11.65 07/19/18   Thurnell Lose, MD  potassium chloride (KLOR-CON 10) 10 MEQ tablet Take 1 tablet (10 mEq total) by mouth 2 (two) times daily. 07/22/18   Biagio Borg, MD  sacubitril-valsartan (ENTRESTO) 49-51 MG Take 1 tablet by mouth 2 (two) times daily. 06/29/18   Shirley Friar, PA-C  spironolactone (ALDACTONE) 25 MG tablet Take 1 tablet (25 mg total) by mouth daily. 05/18/18   Shirley Friar, PA-C  torsemide (DEMADEX) 20 MG tablet Take 1 tablet (20 mg  total) by mouth daily. 05/18/18   Shirley Friar, PA-C    Family History Family History  Problem Relation Age of Onset  . Arthritis Other        family history  . Hypertension Other        grandparent  . Colon cancer Maternal Grandfather   . Asthma Son   . Rheum arthritis Mother   . Hypertension Mother   . Diabetes Maternal Uncle   . Diabetes Paternal Uncle   . Diabetes Maternal Grandmother     Social History Social History   Tobacco Use  . Smoking status: Never Smoker  . Smokeless tobacco: Never Used  Substance Use Topics  . Alcohol use: Never    Alcohol/week: 0.0 standard drinks    Frequency: Never  . Drug use: Never     Allergies   Compazine; Haloperidol decanoate; Penicillins; Glipizide; Metformin and related; Codeine; and Lisinopril   Review of Systems Review of Systems  All other systems reviewed and are negative.    Physical Exam Updated Vital Signs BP (!) 114/50   Pulse 65   Temp (!) 97.4 F (36.3 C)   Resp 17   SpO2 100%   Physical Exam Vitals signs and nursing note reviewed.  Constitutional:      General: She is not in acute distress.    Appearance: She is well-developed. She is ill-appearing.  HENT:     Head: Normocephalic and atraumatic.     Mouth/Throat:     Pharynx: No oropharyngeal exudate.  Eyes:     General: No scleral icterus.       Right eye: No discharge.        Left eye: No discharge.     Conjunctiva/sclera: Conjunctivae normal.     Pupils: Pupils are equal, round, and reactive to light.  Neck:     Musculoskeletal: Normal range of motion and neck supple.     Thyroid: No thyromegaly.     Vascular: No JVD.  Cardiovascular:     Rate and Rhythm: Normal rate and regular rhythm.     Heart sounds: Normal heart sounds. No murmur. No friction rub. No  gallop.   Pulmonary:     Effort: Pulmonary effort is normal. No respiratory distress.     Breath sounds: Normal breath sounds. No wheezing or rales.  Abdominal:      General: Bowel sounds are normal. There is no distension.     Palpations: Abdomen is soft. There is no mass.     Tenderness: There is no abdominal tenderness.  Musculoskeletal: Normal range of motion.        General: No tenderness.  Lymphadenopathy:     Cervical: No cervical adenopathy.  Skin:    General: Skin is warm and dry.     Findings: No erythema or rash.  Neurological:     Mental Status: She is alert.     Coordination: Coordination normal.     Comments: The patient is diffusely generally weak but is awake and able to follow commands.  She is able to move all 4 extremities with what appears to be symmetrical movements, she occasionally has some slurred speech but it seems to be effort driven and sometimes she is more clear with her speech.  She does not appear to be disoriented.  Psychiatric:        Behavior: Behavior normal.      ED Treatments / Results  Labs (all labs ordered are listed, but only abnormal results are displayed) Labs Reviewed  LACTIC ACID, PLASMA - Abnormal; Notable for the following components:      Result Value   Lactic Acid, Venous 2.5 (*)    All other components within normal limits  COMPREHENSIVE METABOLIC PANEL - Abnormal; Notable for the following components:   Sodium 128 (*)    Chloride 92 (*)    CO2 21 (*)    Glucose, Bld 604 (*)    BUN 37 (*)    Creatinine, Ser 1.85 (*)    Total Protein 8.4 (*)    Albumin 3.3 (*)    AST 362 (*)    ALT 495 (*)    Alkaline Phosphatase 377 (*)    GFR calc non Af Amer 27 (*)    GFR calc Af Amer 32 (*)    All other components within normal limits  CBC WITH DIFFERENTIAL/PLATELET - Abnormal; Notable for the following components:   RBC 3.51 (*)    Hemoglobin 10.2 (*)    HCT 31.4 (*)    All other components within normal limits  CBG MONITORING, ED - Abnormal; Notable for the following components:   Glucose-Capillary 595 (*)    All other components within normal limits  CULTURE, BLOOD (ROUTINE X 2)  CULTURE,  BLOOD (ROUTINE X 2)  URINE CULTURE  BRAIN NATRIURETIC PEPTIDE  AMMONIA  LACTIC ACID, PLASMA  URINALYSIS, ROUTINE W REFLEX MICROSCOPIC  BASIC METABOLIC PANEL  CBC WITH DIFFERENTIAL/PLATELET  URINALYSIS, ROUTINE W REFLEX MICROSCOPIC    EKG None  Radiology No results found.  Procedures .Critical Care Performed by: Noemi Chapel, MD Authorized by: Noemi Chapel, MD   Critical care provider statement:    Critical care time (minutes):  35   Critical care time was exclusive of:  Separately billable procedures and treating other patients and teaching time   Critical care was necessary to treat or prevent imminent or life-threatening deterioration of the following conditions:  Endocrine crisis, hepatic failure and renal failure   Critical care was time spent personally by me on the following activities:  Blood draw for specimens, development of treatment plan with patient or surrogate, discussions with consultants, evaluation of patient's response  to treatment, examination of patient, obtaining history from patient or surrogate, ordering and performing treatments and interventions, ordering and review of laboratory studies, ordering and review of radiographic studies, pulse oximetry, re-evaluation of patient's condition and review of old charts   (including critical care time)  Medications Ordered in ED Medications  insulin regular, human (MYXREDLIN) 100 units/ 100 mL infusion (has no administration in time range)  potassium chloride 10 mEq in 100 mL IVPB (has no administration in time range)  sodium chloride 0.9 % bolus 1,000 mL (1,000 mLs Intravenous New Bag/Given 08/17/18 2009)     Initial Impression / Assessment and Plan / ED Course  I have reviewed the triage vital signs and the nursing notes.  Pertinent labs & imaging results that were available during my care of the patient were reviewed by me and considered in my medical decision making (see chart for details).       The  patient appears generally weak, she is hypotensive to 85 systolic but not necessarily tachycardic.  She is not hyperthermic, her mental status is slightly depressed but her GCS is truly 15.  The patient's blood sugar was measured at 595 and a fingerstick blood sugar.  I suspect this is in someway related to noncompliance with diet however she may be medication noncompliant as well.  She will likely need to be readmitted to the hospital but will initially fluid resuscitation and further evaluation as to the cause.  Infection will be ruled out, potassium will be confirmed prior to starting insulin.  The labs show a BNP of 17.5, this is noncontributory.  Her lactic acid was 2.5, sodium was 128 though this is just a pseudohyponatremia.  Her creatinine is 1.85 and compared with prior metabolic panels this is more than 3 times the upper limit of the patient's baseline.  Blood sugar is 604, there is no anion gap acidosis however the patient does have significant liver dysfunction with an AST of 362, ALT of 495 and an alkaline phosphatase of 377.  The patient cannot go to a CT scan given her renal function however she has been given IV fluids and will need to have an ultrasound of the abdomen.  The patient is critically ill with severe hyperglycemia, hypotension and tachycardia and is requiring IV insulin as well as potassium replacement.  He has multiple systems involved including endocrine, renal, hepatic.  I have started an insulin drip after potassium will be given as the patient does have a relative hypokalemia.  She will need to be admitted to the hospital.  Hospitalist consulted.  Final Clinical Impressions(s) / ED Diagnoses   Final diagnoses:  Severe hyperglycemia due to diabetes mellitus ([place])  Acute renal failure, unspecified acute renal failure type ([place])  Transaminitis      Noemi Chapel, MD 08/17/18 2240

## 2018-08-17 NOTE — Telephone Encounter (Signed)
I called Sonia Side, patient's son, back and he says he is just getting to her house, he was stuck in traffic. He woke the patient up and asked is she ok. He says she looks real weak. I asked him to check her blood sugar. He says it is registering as E1 and E4 on the meter. I advised to call 911 to get her to the hospital. The patient asks why does she need to go, I advised to have some blood work to make sure everything is ok and because her son reported her being weak, slurred speech and falling since the last OV, she verbalized understanding. I advised I will send this note to Dr. Jenny Reichmann for review.

## 2018-08-17 NOTE — ED Notes (Signed)
CBG collected. Result "460." RN, Reatha Harps., notified.

## 2018-08-17 NOTE — ED Notes (Signed)
Patient transported to [facility] 

## 2018-08-17 NOTE — H&P (Signed)
History and Physical    Gloria [name]:[id number] DOB: 1951-05-31 DOA: 08/17/2018  Referring MD/NP/PA:   PCP: Biagio Borg, MD   Patient coming from:  The patient is coming from home.  At baseline, pt is independent for most of ADL.        Chief Complaint: Generalized weakness  HPI: Gloria [name] is a 68 y.o. female with medical history significant of hypertension, hyperlipidemia, diabetes mellitus, asthma, depression, CHF with EF of 45%, migraine headache, OSA not on CPAP, cirrhosis, who presents with generalized weakness.  Patient states that she has been having generalized weakness for more than 1 week.  No unilateral numbness or tingling in extremities, No facial droop or slurred speech.  Patient states that she had mild chest discomfort earlier, which has resolved.  Currently no chest pain, shortness breath or cough.  No fever or chills.  Patient states that sometimes she has dysuria, but no burning on urination.  No nausea,  vomiting, diarrhea or abdominal pain.  Per patient's son, patient drinks plenty of amount of soda with sugar every day. Pt was recently hospitalized from 1/17-1/19 due to [facility] in DM.  Patient had hypotension initially in ED, with blood pressure 84/52, which improved to 114/50 after giving 1 L normal saline bolus in ED.  ED Course: pt was found to have elevated blood sugar 604, bicarbonate 21, anion gap 15, WBC 6.7, AKI with creatinine 1.85, BUN 37, negative urinalysis, lactic acid 2.5, ammonia 32, abnormal liver function (ALP 377, AST 362, ALT 495, total bilirubin 1.0), temperature normal, negative chest x-ray. Pt is place in SDU for obs.  Review of Systems:   General: no fevers, chills, no body weight gain, has fatigue and generalized weakness HEENT: no blurry vision, hearing changes or sore throat Respiratory: no dyspnea, coughing, wheezing CV: no chest pain, no palpitations GI: no nausea, vomiting, abdominal pain, diarrhea, constipation GU: has dysuria, no  burning on urination, increased urinary frequency, hematuria  Ext: no leg edema Neuro: no unilateral weakness, numbness, or tingling, no vision change or hearing loss Skin: no rash, no skin tear. MSK: No muscle spasm, no deformity, no limitation of range of movement in spin Heme: No easy bruising.  Travel history: No recent long distant travel.  Allergy:  Allergies  Allergen Reactions  . Compazine Other (See Comments)    Stroke-like symptoms  . Glipizide Shortness Of Breath and Other (See Comments)    Dyspnea   . Haloperidol Decanoate Other (See Comments)    Extrapyramidal syndrome  . Penicillins Nausea And Vomiting    Did it involve swelling of the face/tongue/throat, SOB, or low BP? No, N and V Did it involve sudden or severe rash/hives, skin peeling, or any reaction on the inside of your mouth or nose? No Did you need to seek medical attention at a hospital or doctor's office? No When did it last happen? "a long time ago" If all above answers are "NO", may proceed with cephalosporin use.   . Metformin And Related Nausea Only and Other (See Comments)    GI upset  . Codeine Itching  . Lisinopril Cough    Past Medical History:  Diagnosis Date  . Allergic rhinitis, cause unspecified 03/30/2013  . Anxiety state, unspecified   . Arthritis    "knees" (05/01/2018)  . Asthma    hx (05/01/2018)  . CHF (congestive heart failure) ([place])   . Chronic bronchitis   . Depressive disorder, not elsewhere classified   . Difficulty waking  hard to wake up past sedation!  . Migraine, unspecified, without mention of intractable migraine without mention of status migrainosus   . OSA (obstructive sleep apnea) 12/25/2016  . Other and unspecified hyperlipidemia   . Panic attacks   . Postmenopausal symptoms   . Type 2 diabetes, diet controlled ([place])   . Unspecified essential hypertension   . Unspecified urinary incontinence   . UTI (lower urinary tract infection)    on 02/05/16/on meds     Past Surgical History:  Procedure Laterality Date  . ABDOMINAL HYSTERECTOMY  2002  . DIAGNOSTIC LAPAROSCOPY    . IR PARACENTESIS  05/08/2018  . OPEN REDUCTION SHOULDER DISLOCATION Right   . RIGHT/LEFT HEART CATH AND CORONARY ANGIOGRAPHY N/A 05/05/2018   Procedure: RIGHT/LEFT HEART CATH AND CORONARY ANGIOGRAPHY;  Surgeon: Belva Crome, MD;  Location: [facility];  Service: Cardiovascular;  Laterality: N/A;  . TUBAL LIGATION    . TUMOR EXCISION Right    "ankle"    Social History:  reports that she has never smoked. She has never used smokeless tobacco. She reports that she does not drink alcohol or use drugs.  Family History:  Family History  Problem Relation Age of Onset  . Arthritis Other        family history  . Hypertension Other        grandparent  . Colon cancer Maternal Grandfather   . Asthma Son   . Rheum arthritis Mother   . Hypertension Mother   . Diabetes Maternal Uncle   . Diabetes Paternal Uncle   . Diabetes Maternal Grandmother      Prior to Admission medications   Medication Sig Start Date End Date Taking? Authorizing Provider  ACCU-CHEK AVIVA PLUS test strip USE AS DIRECTED FOUR TIMES DAILY 05/12/18   Biagio Borg, MD  ALPRAZolam Duanne Moron) 1 MG tablet Take 1 tablet (1 mg total) by mouth at bedtime as needed for anxiety. 07/22/18   Biagio Borg, MD  atorvastatin (LIPITOR) 40 MG tablet Take 1 tablet (40 mg total) by mouth daily. 07/19/18   Thurnell Lose, MD  blood glucose meter kit and supplies KIT Dispense based on patient and insurance preference. Use up to four times daily as directed. (FOR ICD-9 250.00, 250.01). For QAC - HS accuchecks. 07/19/18   Thurnell Lose, MD  camphor-menthol [facility]) lotion Apply 1 application topically 2 (two) times daily. 05/11/18   Kayleen Memos, DO  carvedilol (COREG) 3.125 MG tablet Take 1 tablet (3.125 mg total) by mouth 2 (two) times daily with a meal. 05/18/18   Tillery, Satira Mccallum, PA-C  feeding supplement,  ENSURE ENLIVE, (ENSURE ENLIVE) LIQD Take 237 mLs by mouth 2 (two) times daily between meals. 05/11/18   Kayleen Memos, DO  gabapentin (NEURONTIN) 100 MG capsule Take 1 capsule (100 mg total) by mouth 3 (three) times daily. 07/22/18   Biagio Borg, MD  glucose blood (ACCU-CHEK AVIVA) test strip Use as instructed four times per day 08/26/17   Philemon Kingdom, MD  insulin aspart (NOVOLOG) 100 UNIT/ML injection Substitute to any brand approved.Before each meal 3 times a day, 140-199 - 2 units, 200-250 - 4 units, 251-299 - 6 units,  300-349 - 8 units,  350 or above 10 units. Dispense syringes and needles as needed, Ok to switch to PEN if approved. DX DM2, Code E11.65 07/19/18   Thurnell Lose, MD  insulin glargine (LANTUS) 100 UNIT/ML injection Inject 0.25 mLs (25 Units total) into the  skin at bedtime. Dispense insulin pen if approved, if not dispense as needed syringes and needles for 1 month supply. Can switch to Levemir. Diagnosis E 11.65. 07/19/18   Thurnell Lose, MD  Insulin Pen Needle (BD PEN NEEDLE NANO U/F) 32G X 4 MM MISC USE ONCE DAILY 08/26/17   Philemon Kingdom, MD  Insulin Syringe-Needle U-100 25G X 1" 1 ML MISC For 4 times a day insulin SQ, 1 month supply. Diagnosis E11.65 07/19/18   Thurnell Lose, MD  potassium chloride (KLOR-CON 10) 10 MEQ tablet Take 1 tablet (10 mEq total) by mouth 2 (two) times daily. 07/22/18   Biagio Borg, MD  sacubitril-valsartan (ENTRESTO) 49-51 MG Take 1 tablet by mouth 2 (two) times daily. 06/29/18   Shirley Friar, PA-C  spironolactone (ALDACTONE) 25 MG tablet Take 1 tablet (25 mg total) by mouth daily. 05/18/18   Shirley Friar, PA-C  torsemide (DEMADEX) 20 MG tablet Take 1 tablet (20 mg total) by mouth daily. 05/18/18   Shirley Friar, PA-C    Physical Exam: Vitals:   08/18/18 0330 08/18/18 0400 08/18/18 0430 08/18/18 0603  BP: (!) 140/58 140/85 136/65 (!) 124/54  Pulse:   73 72  Resp: _0 Temp:    98.8 F (37.1  C)  TempSrc:    Oral  SpO2:   97% 98%   General: Not in acute distress HEENT:       Eyes: PERRL, EOMI, no scleral icterus.       ENT: No discharge from the ears and nose, no pharynx injection, no tonsillar enlargement.        Neck: No JVD, no bruit, no mass felt. Heme: No neck lymph node enlargement. Cardiac: S1/S2, RRR, No murmurs, No gallops or rubs. Respiratory: No rales, wheezing, rhonchi or rubs. GI: Soft, nondistended, nontender, no rebound pain, no organomegaly, BS present. GU: No hematuria Ext: No pitting leg edema bilaterally. 2+DP/PT pulse bilaterally. Musculoskeletal: No joint deformities, No joint redness or warmth, no limitation of ROM in spin. Skin: No rashes.  Neuro: Alert, oriented X3, cranial nerves II-XII grossly intact, moves all extremities. Psych: Patient is not psychotic, no suicidal or hemocidal ideation.  Labs on Admission: I have personally reviewed following labs and imaging studies  CBC: Recent Labs  Lab 08/17/18 1953 08/17/18 2118 08/18/18 0249  WBC 6.7 6.4 6.5  NEUTROABS 4.7 4.6  --   HGB 10.2* 10.0* 9.6*  HCT 31.4* 29.8* 29.3*  MCV 89.5 89.0 88.5  PLT 222 218 956   Basic Metabolic Panel: Recent Labs  Lab 08/17/18 1953 08/17/18 2118 08/18/18 0249  NA 128* 131* 136  K 3.9 4.0 4.0  CL 92* 99 104  CO2 21* 21* 22  GLUCOSE 604* 447* 227*  BUN 37* 34* 32*  CREATININE 1.85* 1.58* 1.50*  CALCIUM 9.6 9.3 9.2   GFR: CrCl cannot be calculated (Unknown ideal weight.). Liver Function Tests: Recent Labs  Lab 08/17/18 1953 08/18/18 0249  AST 362* 307*  ALT 495* 464*  ALKPHOS 377* 345*  BILITOT 1.0 1.0  PROT 8.4* 7.4  ALBUMIN 3.3* 2.8*   No results for input(s): LIPASE, AMYLASE in the last 168 hours. Recent Labs  Lab 08/17/18 1953  AMMONIA 32   Coagulation Profile: Recent Labs  Lab 08/18/18 0340  INR 1.02   Cardiac Enzymes: No results for input(s): CKTOTAL, CKMB, CKMBINDEX, TROPONINI in the last 168 hours. BNP (last 3  results) No results for input(s): PROBNP in the last 8760  hours. HbA1C: No results for input(s): HGBA1C in the last 72 hours. CBG: Recent Labs  Lab 08/18/18 0113 08/18/18 0227 08/18/18 0336 08/18/18 0440 08/18/18 0604  GLUCAP 332* 241* 146* 120* 140*   Lipid Profile: No results for input(s): CHOL, HDL, LDLCALC, TRIG, CHOLHDL, LDLDIRECT in the last 72 hours. Thyroid Function Tests: No results for input(s): TSH, T4TOTAL, FREET4, T3FREE, THYROIDAB in the last 72 hours. Anemia Panel: No results for input(s): VITAMINB12, FOLATE, FERRITIN, TIBC, IRON, RETICCTPCT in the last 72 hours. Urine analysis:    Component Value Date/Time   COLORURINE STRAW (A) 08/17/2018 2120   APPEARANCEUR CLEAR 08/17/2018 2120   LABSPEC 1.010 08/17/2018 2120   PHURINE 6.0 08/17/2018 2120   GLUCOSEU >=500 (A) 08/17/2018 2120   GLUCOSEU NEGATIVE 02/25/2018 1500   HGBUR NEGATIVE 08/17/2018 2120   BILIRUBINUR NEGATIVE 08/17/2018 2120   BILIRUBINUR negatvie 11/04/2016 1312   KETONESUR NEGATIVE 08/17/2018 2120   PROTEINUR NEGATIVE 08/17/2018 2120   UROBILINOGEN 0.2 02/25/2018 1500   NITRITE NEGATIVE 08/17/2018 2120   LEUKOCYTESUR NEGATIVE 08/17/2018 2120   Sepsis Labs: _0 (procalcitonin:4,lacticidven:4) )No results found for this or any previous visit (from the past 240 hour(s)).   Radiological Exams on Admission: US Abdomen Complete  Result Date: 08/17/2018 CLINICAL DATA:  Right upper quadrant pain EXAM: ABDOMEN ULTRASOUND COMPLETE COMPARISON:  CT abdomen pelvis 05/01/2018 FINDINGS: Gallbladder: No gallstones or wall thickening visualized. No sonographic Murphy sign noted by sonographer. Common bile duct: Diameter: 3 mm Liver: No focal lesion identified. Within normal limits in parenchymal echogenicity, despite abnormal appearance on prior CT. Portal vein is patent on color Doppler imaging with normal direction of blood flow towards the liver. IVC: No abnormality visualized. Pancreas: Visualized  portion unremarkable. Spleen: Size and appearance within normal limits. Right Kidney: Length: 11.3 cm. Echogenicity within normal limits. No mass or hydronephrosis visualized. Left Kidney: Length: 11.9 cm. Poorly visualized due to overlying bowel gas Abdominal aorta: No aneurysm visualized. Other findings: None. IMPRESSION: 1. No cholelithiasis or other evidence of acute cholecystitis. 2. No acute abnormality of the abdomen. Electronically Signed   By: Ulyses Jarred M.D.   On: 08/17/2018 22:33     EKG: Independently reviewed.  Sinus rhythm, QTC 464, early R wave progression, LAE, mild ST depression in lead III/aVF and V3-V4.  Assessment/Plan Principal Problem:   Hyperosmolar non-ketotic state in patient with type 2 diabetes mellitus ([place]) Active Problems:   Hyperlipidemia   Anxiety state   Essential hypertension   Uncontrolled type 2 diabetes mellitus with diabetic neuropathy, without long-term current use of insulin (HCC)   Cirrhosis of liver with ascites (HCC)   Chronic combined systolic (congestive) and diastolic (congestive) heart failure (HCC)   Abnormal LFTs   Hypotension   AKI (acute kidney injury) ([place])   Lactic acidosis   Hyperosmolar non-ketotic state in patient with type 2 diabetes mellitus ([place]): this is a recurrent issue.  Blood sugar 604, bicarbonate 21, anion gap 15.  Patient was recently hospitalized for similar issue.  Per her son, patient has been drinking plenty amount of soda with sugar, which is likely the reason.  Patient was initially treated with insulin drip.  Blood sugar decreased to 140, then started Lantus and sliding scale insulin.  -changed initially bed requested from stepdown to telemetry for observation -IVF: 1L NS, then 75 cc/h -Started Lantus and sliding scale insulin -Patient was educated to not drink too much sugar-containing soft drinks. -Consult diabetic educator.   Uncontrolled type 2 diabetes mellitus with diabetic neuropathy, without long-term  current use of insulin: Last A1c 10.0 on 07/18/18, poorly controled. Patient is taking Lantus at home -will decrease Lantus dose from 25 to 20 units daily -SSI  Hyperlipidemia: -Hold Lipitor due to abnormal liver function  Anxiety state: -prn ativan  Hypotension: Blood pressure 84/50, which improved to 114/50 after giving 1 L normal saline bolus.  Possibly due to dehydration and continuation of multiple blood pressure medications.  Patient is taking Coreg, Entresto, spironolactone, torsemide.  No fever or leukocytosis.  Low suspicions of sepsis. -IV fluid as above - Hold Entresto, spironolactone, torsemide  Essential hypertension: -IV Hydralazine prn -Continue home medications: Coreg, -Hold Entresto, spironolactone, torsemide due to hypotension  Cirrhosis of liver with ascites and Abnormal LFTs: ALP 377, AST 362, ALT 495, total bilirubin 1.0. ammonia normal 32.  Etiology is not clear for elevated transaminase. Differential diagnosis include standing toxicity.  Patient is taking Lipitor -check INR and PTT -Hepatitis panel -Hold Lipitor -Check Tylenol level  Chronic combined systolic (congestive) and diastolic (congestive) heart failure ([place]): Patient used to have EF of 25%, but recent 2D echo on 07/18/2018 showed EF of 45-50% with grade 1 diastolic dysfunction.  Patient does not have leg edema or JVD.  Patient seems to be dry clinically.  BNP 17.5. -Hold torsemide, Entresto and spironolactone as above -Watch volume status closely.  AKI (acute kidney injury) [facility]): Baseline Cre is 0.5 recently, pt's Cre is 1.85 and BUN 37 on admission. Likely due to prerenal secondary to dehydration and continuation of ACEI, ARB, diuretics. - IVF as above - Follow up renal function by BMP - Check FeUrea - Hold Entresto, spironolactone, torsemide  Lactic acidosis: Lactic acid 2.5, 2.2.  Patient does not have leukocytosis or fever, does not seem to have sepsis.  UA negative. Possibly due to  dehydration. -IV fluids as above -Follow-up blood culture, urine culture. -Trend lactic acid level.    DVT ppx: SQ Heparin  Code Status: Full code Family Communication:  Yes, patient's  son  at bed side Disposition Plan:  Anticipate discharge back to previous home environment Consults called:  none Admission status: SDU/obs-->changed to tele/obs   Date of Service 08/18/2018    [facility]   If 7PM-7AM, please contact night-coverage [email or web address] Password [facility] 08/18/2018, 6:15 AM

## 2018-08-17 NOTE — ED Notes (Signed)
Pt back from Ultrasound

## 2018-08-18 ENCOUNTER — Ambulatory Visit: Payer: Medicare Other | Admitting: Internal Medicine

## 2018-08-18 ENCOUNTER — Telehealth: Payer: Self-pay | Admitting: Internal Medicine

## 2018-08-18 ENCOUNTER — Observation Stay (HOSPITAL_COMMUNITY): Payer: Medicare Other

## 2018-08-18 DIAGNOSIS — E1100 Type 2 diabetes mellitus with hyperosmolarity without nonketotic hyperglycemic-hyperosmolar coma (NKHHC): Secondary | ICD-10-CM | POA: Diagnosis not present

## 2018-08-18 DIAGNOSIS — M6281 Muscle weakness (generalized): Secondary | ICD-10-CM | POA: Diagnosis not present

## 2018-08-18 LAB — COMPREHENSIVE METABOLIC PANEL
ALT: 464 U/L — ABNORMAL HIGH (ref 0–44)
AST: 307 U/L — ABNORMAL HIGH (ref 15–41)
Albumin: 2.8 g/dL — ABNORMAL LOW (ref 3.5–5.0)
Alkaline Phosphatase: 345 U/L — ABNORMAL HIGH (ref 38–126)
Anion gap: 10 (ref 5–15)
BUN: 32 mg/dL — ABNORMAL HIGH (ref 8–23)
CO2: 22 mmol/L (ref 22–32)
Calcium: 9.2 mg/dL (ref 8.9–10.3)
Chloride: 104 mmol/L (ref 98–111)
Creatinine, Ser: 1.5 mg/dL — ABNORMAL HIGH (ref 0.44–1.00)
GFR calc Af Amer: 41 mL/min — ABNORMAL LOW (ref >60)
GFR calc non Af Amer: 35 mL/min — ABNORMAL LOW (ref >60)
Glucose, Bld: 227 mg/dL — ABNORMAL HIGH (ref 70–99)
Potassium: 4 mmol/L (ref 3.5–5.1)
Sodium: 136 mmol/L (ref 135–145)
Total Bilirubin: 1 mg/dL (ref 0.3–1.2)
Total Protein: 7.4 g/dL (ref 6.5–8.1)

## 2018-08-18 LAB — CBC
HCT: 29.3 % — ABNORMAL LOW (ref 36.0–46.0)
Hemoglobin: 9.6 g/dL — ABNORMAL LOW (ref 12.0–15.0)
MCH: 29 pg (ref 26.0–34.0)
MCHC: 32.8 g/dL (ref 30.0–36.0)
MCV: 88.5 fL (ref 80.0–100.0)
Platelets: 225 10*3/uL (ref 150–400)
RBC: 3.31 MIL/uL — ABNORMAL LOW (ref 3.87–5.11)
RDW: 13 % (ref 11.5–15.5)
WBC: 6.5 10*3/uL (ref 4.0–10.5)
nRBC: 0 % (ref 0.0–0.2)

## 2018-08-18 LAB — GLUCOSE, CAPILLARY
Glucose-Capillary: 140 mg/dL — ABNORMAL HIGH (ref 70–99)
Glucose-Capillary: 184 mg/dL — ABNORMAL HIGH (ref 70–99)
Glucose-Capillary: 188 mg/dL — ABNORMAL HIGH (ref 70–99)
Glucose-Capillary: 369 mg/dL — ABNORMAL HIGH (ref 70–99)
Glucose-Capillary: 213 mg/dL — ABNORMAL HIGH (ref 70–99)

## 2018-08-18 LAB — LACTIC ACID, PLASMA
Lactic Acid, Venous: 2.2 mmol/L (ref 0.5–1.9)
Lactic Acid, Venous: 2 mmol/L (ref 0.5–1.9)
Lactic Acid, Venous: 1.7 mmol/L (ref 0.5–1.9)

## 2018-08-18 LAB — CBG MONITORING, ED
Glucose-Capillary: 120 mg/dL — ABNORMAL HIGH (ref 70–99)
Glucose-Capillary: 146 mg/dL — ABNORMAL HIGH (ref 70–99)
Glucose-Capillary: 241 mg/dL — ABNORMAL HIGH (ref 70–99)
Glucose-Capillary: 332 mg/dL — ABNORMAL HIGH (ref 70–99)
Glucose-Capillary: 365 mg/dL — ABNORMAL HIGH (ref 70–99)

## 2018-08-18 LAB — BASIC METABOLIC PANEL
Anion gap: 11 (ref 5–15)
BUN: 34 mg/dL — ABNORMAL HIGH (ref 8–23)
CO2: 21 mmol/L — ABNORMAL LOW (ref 22–32)
Calcium: 9.3 mg/dL (ref 8.9–10.3)
Chloride: 99 mmol/L (ref 98–111)
Creatinine, Ser: 1.58 mg/dL — ABNORMAL HIGH (ref 0.44–1.00)
GFR calc Af Amer: 39 mL/min — ABNORMAL LOW (ref >60)
GFR calc non Af Amer: 33 mL/min — ABNORMAL LOW (ref >60)
Glucose, Bld: 447 mg/dL — ABNORMAL HIGH (ref 70–99)
Potassium: 4 mmol/L (ref 3.5–5.1)
Sodium: 131 mmol/L — ABNORMAL LOW (ref 135–145)

## 2018-08-18 LAB — ACETAMINOPHEN LEVEL: Acetaminophen (Tylenol), Serum: <10 ug/mL — ABNORMAL LOW (ref 10–30)

## 2018-08-18 LAB — APTT: aPTT: 27 seconds (ref 24–36)

## 2018-08-18 LAB — CREATININE, URINE, RANDOM: Creatinine, Urine: 31.94 mg/dL

## 2018-08-18 LAB — PROTIME-INR
INR: 1.02
Prothrombin Time: 13.3 seconds (ref 11.4–15.2)

## 2018-08-18 MED ORDER — INSULIN GLARGINE 100 UNIT/ML ~~LOC~~ SOLN
25.0000 [IU] | Freq: Every day | SUBCUTANEOUS | Status: DC
  Administered 2018-08-18 – 2018-08-19 (×2): 25 [IU] via SUBCUTANEOUS
  Filled 2018-08-18 (×2): qty 0.25

## 2018-08-18 MED ORDER — INSULIN ASPART 100 UNIT/ML ~~LOC~~ SOLN
0.0000 [IU] | Freq: Three times a day (TID) | SUBCUTANEOUS | Status: DC
  Administered 2018-08-18: 9 [IU] via SUBCUTANEOUS
  Administered 2018-08-18 – 2018-08-19 (×3): 2 [IU] via SUBCUTANEOUS
  Administered 2018-08-19: 3 [IU] via SUBCUTANEOUS
  Administered 2018-08-20: 2 [IU] via SUBCUTANEOUS
  Administered 2018-08-21: 1 [IU] via SUBCUTANEOUS
  Administered 2018-08-21 – 2018-08-23 (×4): 2 [IU] via SUBCUTANEOUS
  Administered 2018-08-24: 1 [IU] via SUBCUTANEOUS

## 2018-08-18 MED ORDER — INSULIN GLARGINE 100 UNIT/ML ~~LOC~~ SOLN
20.0000 [IU] | Freq: Every day | SUBCUTANEOUS | Status: DC
  Administered 2018-08-18: 20 [IU] via SUBCUTANEOUS
  Filled 2018-08-18 (×2): qty 0.2

## 2018-08-18 MED ORDER — INSULIN ASPART 100 UNIT/ML ~~LOC~~ SOLN
3.0000 [IU] | Freq: Three times a day (TID) | SUBCUTANEOUS | Status: DC
  Administered 2018-08-18 – 2018-08-19 (×3): 3 [IU] via SUBCUTANEOUS

## 2018-08-18 NOTE — Telephone Encounter (Signed)
Pt's son needs a letter stating his mothers health conditions so he can her "FNS" forms resubmitted for reauthorization for financial help. He states that she is currently in the hospital again and I do see that listed in [place]. He would like to have the letter in the next day or so in order to turn the paperwork in.

## 2018-08-18 NOTE — Care Management Obs Status (Signed)
[place] NOTIFICATION   Patient Details  Name: Gloria [name] MRN: [id number] Date of Birth: August 06, 1950   Medicare Observation Status Notification Given:  Yes    Bartholomew Crews, RN 08/18/2018, 2:36 PM

## 2018-08-18 NOTE — Telephone Encounter (Signed)
I am not able to do phone consultations.  Please encourage to have son bring mother to a visit if possible

## 2018-08-18 NOTE — Progress Notes (Signed)
Inpatient Diabetes Program Recommendations  AACE/ADA: New Consensus Statement on Inpatient Glycemic Control (2015)  Target Ranges:  Prepandial:   less than 140 mg/dL      Peak postprandial:   less than 180 mg/dL (1-2 hours)      Critically ill patients:  140 - 180 mg/dL   Lab Results  Component Value Date   GLUCAP 369 (H) 08/18/2018   HGBA1C 10.0 (H) 07/18/2018    Review of Glycemic Control Results for Gloria [name], Gloria [name] (MRN [id number]) as of 08/18/2018 16:38  Ref. Range 08/18/2018 06:04 08/18/2018 08:01 08/18/2018 11:25 08/18/2018 16:09  Glucose-Capillary Latest Ref Range: 70 - 99 mg/dL 140 (H) 184 (H) 188 (H) 369 (H)   Diabetes history: Type 2 DM Outpatient Diabetes medications: Novolog 2-10 units TID, Lantus 25 units QHS Current orders for Inpatient glycemic control: Lantus 20 units QHS, Novolog 0-9 units TID  Inpatient Diabetes Program Recommendations:    Noted that time of discontinuation of insulin drip and administration of insulin was at the same time, thus anticipate blood sugars to increase.  Consider adding on Novolog 3 units TID (assuming that patient is consuming >50% of meals) and increasing Lantus 25 units QHS (patient's home dose).  Addendum @1530 : Spoke with patient and family regarding outpatient management of DM. Patient reports that Dr Cruzita Lederer had taken her off of her insulin and diabetic medications when she was last seen on 12/14/2017 because her A1C had significantly improved. This was validated in Dr Arman Filter note, however there was no follow up. However, since her last hospitalization in 05/2018 insulin and medications were restarted.  Reviewed patient's current A1c of 10.0%. Explained what a A1c is and what it measures. Also reviewed goal A1c with patient, importance of good glucose control @ home, and blood sugar goals. Reviewed patho of DM, need for insulin, role of pancreas, HHNK, signs and symptoms of hyperglycemia, vascular changes and comorbidites.  Patient has a  meter and has been checking, but doesn't write them down. She states, "the meter just continues to read high." Family members report that she has been checking and verifies seeing her take her medications. However, all members are reporting poor dietary habits to include: large consumption of soda, other family members will bring in food since she has been fatigued, and eating high carbohydrate snack such as chip and cereal. Patient's son does all the shopping and purchases items that are appropriate. Denies missed dosages, however, reports patient has been really fatigued and wonder if some doses were missed. Educated on importance of being mindful with dietary choices, carb counting, reviewed goal setting, selecting foods/snacks that are of nutritional significance and importance of eliminating sugary beverages. Will place consult for dietitian for additional follow up.  Further discussion on when to call MD. Patient has follow up already scheduled with Dr Cruzita Lederer.  Patient has no further questions at this time.   Thanks, Bronson Curb, MSN, RNC-OB Diabetes Coordinator [phone] (8a-5p)

## 2018-08-18 NOTE — Evaluation (Signed)
Physical Therapy Evaluation Patient Details Name: Gloria [name] MRN: [id number] DOB: 09/18/1950 Today's Date: 08/18/2018   History of Present Illness  Gloria [name] is a 68 y.o. female with medical history significant of hypertension, hyperlipidemia, diabetes mellitus, asthma, depression, CHF with EF of 45%, migraine headache, OSA not on CPAP, cirrhosis, who presents with generalized weakness.  Clinical Impression  Pt admitted with/for generalized weakness.  Pt still needing minimal assist from mobility and gait.Marland Kitchen  Pt currently limited functionally due to the problems listed. ( See problems list.)   Pt will benefit from PT to maximize function and safety in order to get ready for next venue listed below.     Follow Up Recommendations SNF;Other (comment)(st SNF)    Equipment Recommendations  None recommended by PT    Recommendations for Other Services       Precautions / Restrictions Precautions Precautions: Fall      Mobility  Bed Mobility Overal bed mobility: Needs Assistance Bed Mobility: Supine to Sit     Supine to sit: Min guard     General bed mobility comments: pulling on mattress in lieu of not being able to use the rail  Transfers Overall transfer level: Needs assistance   Transfers: Sit to/from Stand Sit to Stand: Min assist         General transfer comment: cues for hand placement/transfer safety  Ambulation/Gait Ambulation/Gait assistance: Min assist Gait Distance (Feet): 50 Feet Assistive device: Rolling walker (2 wheeled) Gait Pattern/deviations: Step-through pattern Gait velocity: slower   General Gait Details: weak-kneed gait pattern with moderate use of the RW.  Cues to stay appropriately close to the RW and stability assist  Stairs            Wheelchair Mobility    Modified Rankin (Stroke Patients Only)       Balance Overall balance assessment: Needs assistance Sitting-balance support: Feet supported Sitting balance-Leahy Scale:  Poor     Standing balance support: Bilateral upper extremity supported Standing balance-Leahy Scale: Poor Standing balance comment: reliant on the RW due to weakness                             Pertinent Vitals/Pain Pain Assessment: No/denies pain    Home Living Family/patient expects to be discharged to:: Private residence Living Arrangements: Alone Available Help at Discharge: Family;Available 24 hours/day;Other (Comment)(son relates that he is at work and she needs to be able to b) Type of Home: House Home Access: Level entry     Home Layout: [place]: Environmental consultant - 2 wheels      Prior Function Level of Independence: Independent         Comments: last week she has not been able to mobilize safely     Hand Dominance        Extremity/Trunk Assessment   Upper Extremity Assessment Upper Extremity Assessment: Generalized weakness    Lower Extremity Assessment Lower Extremity Assessment: Generalized weakness       Communication   Communication: No difficulties  Cognition Arousal/Alertness: Awake/alert Behavior During Therapy: WFL for tasks assessed/performed Overall Cognitive Status: Within Functional Limits for tasks assessed                                        General Comments      Exercises     Assessment/Plan  PT Assessment Patient needs continued PT services  PT Problem List Decreased strength;Decreased activity tolerance;Decreased balance;Decreased mobility;Decreased knowledge of use of DME       PT Treatment Interventions Gait training;Functional mobility training;DME instruction;Therapeutic activities;Balance training;Patient/family education    PT Goals (Current goals can be found in the Care Plan section)  Acute Rehab PT Goals Patient Stated Goal: back home PT Goal Formulation: With patient Time For Goal Achievement: 08/25/18 Potential to Achieve Goals: Good    Frequency Min 3X/week    Barriers to discharge        Co-evaluation               AM-PAC PT "6 Clicks" Mobility  Outcome Measure Help needed turning from your back to your side while in a flat bed without using bedrails?: A Little Help needed moving from lying on your back to sitting on the side of a flat bed without using bedrails?: A Little Help needed moving to and from a bed to a chair (including a wheelchair)?: A Little Help needed standing up from a chair using your arms (e.g., wheelchair or bedside chair)?: A Little Help needed to walk in hospital room?: A Little Help needed climbing 3-5 steps with a railing? : A Lot 6 Click Score: 17    End of Session   Activity Tolerance: Patient tolerated treatment well Patient left: in bed;with nursing/sitter in room;with call bell/phone within reach Nurse Communication: Mobility status PT Visit Diagnosis: Unsteadiness on feet (R26.81);Other abnormalities of gait and mobility (R26.89);Muscle weakness (generalized) (M62.81)    Time: 9622-2979 PT Time Calculation (min) (ACUTE ONLY): 39 min   Charges:   PT Evaluation $PT Eval Moderate Complexity: 1 Mod PT Treatments $Gait Training: 8-22 mins $Therapeutic Activity: 8-22 mins        08/18/2018  Donnella Sham, PT Acute Rehabilitation Services [phone]  (pager) [phone]  (office)  Gloria [name] 08/18/2018, 2:06 PM

## 2018-08-18 NOTE — Plan of Care (Signed)
  Problem: Activity: Goal: Risk for activity intolerance will decrease Outcome: Progressing Able to ambulate short distance with PT in hallway   Problem: Nutrition: Goal: Adequate nutrition will be maintained Outcome: Progressing   Problem: Elimination: Goal: Will not experience complications related to urinary retention Outcome: Not Progressing Foley remains in place for urinary retention

## 2018-08-18 NOTE — Telephone Encounter (Signed)
Copied from [place] [id number]. Topic: Quick Communication - See Telephone Encounter >> Aug 18, 2018 10:42 AM Blase Mess A wrote: CRM for notification. See Telephone encounter for: 08/18/18.  Patient son is calling Gloria [name]. The patient is calling to see if he can get some documentation for his mother for DSS for disabilty. Please advise thank you. [phone]

## 2018-08-18 NOTE — Telephone Encounter (Signed)
Pt's son understood and will bring by the required paperwork today.

## 2018-08-18 NOTE — Telephone Encounter (Signed)
Noted.   However pt's son has already been informed to drop off the necessary paperwork needed for his mother.

## 2018-08-18 NOTE — Care Management Note (Signed)
Case Management Note Manya Silvas, RN MSN CCM Transitions of Care 21M [place] [phone]  Patient Details  Name: TIRA LAFFERTY MRN: [id number] Date of Birth: 07-10-1950  Subjective/Objective:     HHNK               Action/Plan: Spoke with patient at bedside. PTA home alone. States her son checks on her. Uses [facility] for prescriptions. States she would like CIR for therapy. Discussed SNF recommendations-patient verbalized understanding but disappointed. Advised that CSW would see her to discuss her choices. CM to continue to follow for transition of care needs.   Expected Discharge Date:                  Expected Discharge Plan:  Skilled Nursing Facility  In-House Referral:  Clinical Social Work  Discharge planning Services  CM Consult  Post Acute Care Choice:  NA Choice offered to:  NA  DME Arranged:  N/A DME Agency:  NA  HH Arranged:  NA HH Agency:  NA  Status of Service:     If discussed at [place] of Stay Meetings, dates discussed:    Additional Comments:  Bartholomew Crews, RN 08/18/2018, 2:37 PM

## 2018-08-18 NOTE — Telephone Encounter (Signed)
Pt son Leaha Cuervo [phone] calling wanting to know if Dr Jenny Reichmann can fill out a FL2 form for his mother need to be done as soon as possible  He is concerned about his mothers mental status and would like a call back to discuss some therapy for issues that has come about lately

## 2018-08-18 NOTE — Progress Notes (Signed)
Patient arrived on unit. A/Ox4. Placed on telemetry. Will continue to monitor.

## 2018-08-18 NOTE — Telephone Encounter (Signed)
FYI

## 2018-08-18 NOTE — Telephone Encounter (Signed)
Sorry, unable to do this as We would need some sort of written information regarding this request, as we would need to know who the letter is to, the purpose of the letter, and specific information required

## 2018-08-18 NOTE — Progress Notes (Signed)
PROGRESS NOTE    Gloria [name]  VOZ:[id number] DOB: August 21, 1950 DOA: 08/17/2018 PCP: Biagio Borg, MD   Brief Narrative: Female with history of hypertension, hyperlipidemia and diabetes mellitus on insulin, asthma, depression, systolic CHF with EF 42%, migraine headache, OSA not on CPAP, liver cirrhosis came in with generalized weakness x1 wk, uncontrolled blood sugar in 500s at home.  Recent hospital stay 1/17 to 1/19 due to HONK/AKI. In the ER,hyperglycemic in 600 anion gap 15 AKI with creatinine at 1.85 elevated LFTs. Patient was placed on insulin drip, IV fluids and was admitted.  Subjective: This morning still complains having generalized weakness.Does not feel ready to go home today. Denies chest pain shortness of breath.  Assessment & Plan:   Hyperosmolar non-ketotic state in patient with type 2 diabetes mellitus on longterm insulin (hba1c 10, 07/18/18): poorly controlled. No off insulin drip, currently on Lantus and sliding scale insulin and sugar has improved.  Secondary to dietary noncompliance.  Patient reports Gloria [name] has been diabetic for past 1 year, does not seem to be very well educated versus Gloria [name] is noncompliant.  Diabetic educator has been consulted.  I did counsel for dietary restriction.   AKI , baseline creatinine 0.5 : Suspect multifactorial due to volume loss from hyperglycemia, also on multiple medication diuretics Aldactone Entresto. Cont on ivf and repeat BMP. Hold diuretics. Recent Labs  Lab 08/17/18 1953 08/17/18 2118 08/18/18 0249  CREATININE 1.85* 1.58* 1.50*   Hypotension/Lactic acidosis: Suspect secondary to volume depletion/medication.  Blood pressure stable lactic acidosis resolved.  Generalized weakness: Suspect deconditioning/secondary to hyperglycemia and dehydration, hypertension. obtain PT.OT eval, likely needs SNF- discussed w PT post eval- has gen weakness. Son reports x2 wk, requesting neuro imaging. CT ordered. Gloria [name] is non focal on exam.  Chronic  combined systolic and diastolic chf , TTE on 5/95/63, w LVEF 45 to 50% compared to 05/2018 w/ lvef of 25%. Seen by cardiology in 07/18/18 in office and torsemide cut down.Has a repeat echo planned on 2/29. Currently compensated.  Gloria [name] is on Entresto, Aldactone, torsemide and currently held due to acute renal failure/hypotension in the ER.  Continue Coreg.  Resume once creatinine improves.  Will need to be on salt and fluid restriction. May need to back off on her cardiac meds given her improvement in LVEF as well as given her recurrent admissions.  Hyperlipidemia: Statin being held due to elevated LFTs.  Anxiety state: Mood is stable  Essential hypertension: On presentation hypotensive.  Currently stable, continue Coreg.  ?Cirrhosis of liver with ascites/ Abnormal LFTs: AST ALT/ALP in 300-400. supsecting in the setting of chf.Gloria [name] has had previous paracentesis x2  back in November thought to be cardiogenic ascites. Hold statins. Monitor lfts. Pending acute hepatitis panel.  Right upper quadrant ultrasound IS benign.  DVT prophylaxis: Subcu heparin Code Status: Full code Family Communication: None at bedside this am. In afternoon updated son over the phone in room. Came back to see him but not in room  Disposition Plan: PT/OT eval. Possible SNF tomorrow.  Consultants: Diabetic educator  Procedures: None  RUQ [place]  1. No cholelithiasis or other evidence of acute cholecystitis. 2. No acute abnormality of the abdomen.  Antimicrobials: Anti-infectives (From admission, onward)   None       Objective: Vitals:   08/18/18 0400 08/18/18 0430 08/18/18 0603 08/18/18 0800  BP: 140/85 136/65 (!) 124/54 (!) 116/49  Pulse:  73 72 74  Resp: 15 17 19 17   Temp:   98.8 F (37.1 C) 98.9  F (37.2 C)  TempSrc:   Oral Oral  SpO2:  97% 98%     Intake/Output Summary (Last 24 hours) at 08/18/2018 1106 Last data filed at 08/18/2018 0900 Gross per 24 hour  Intake 150 ml  Output 900 ml  Net -750 ml    There were no vitals filed for this visit. Weight change:   There is no height or weight on file to calculate BMI.  Intake/Output from previous day: 02/17 0701 - 02/18 0700 In: 0  Out: 600 [Urine:600] Intake/Output this shift: Total I/O In: 150 [P.O.:150] Out: 300 [Urine:300]  Examination:  General exam: Appears calm and comfortable,weak, NAD 'HEENT:PERRL,Oral mucosa moist, Ear/Nose normal on gross exam Respiratory system: Bilateral equal air entry, normal vesicular breath sounds, no wheezes or crackles  Cardiovascular system: S1 & S2 heard,No JVD, murmurs. Gastrointestinal system: Abdomen is  soft, non tender, non distended, BS +  Nervous System:Alert and oriented. No focal neurological deficits/moving extremities, sensation intact. Extremities: No edema, no clubbing, distal peripheral pulses palpable. Skin: No rashes, lesions, no icterus MSK: Normal muscle bulk,tone ,power  Medications:  Scheduled Meds: . carvedilol  3.125 mg Oral BID WC  . gabapentin  100 mg Oral TID  . heparin  5,000 Units Subcutaneous Q8H  . insulin aspart  0-9 Units Subcutaneous TID WC  . insulin glargine  20 Units Subcutaneous QHS   Continuous Infusions: . sodium chloride 75 mL/hr at 08/18/18 0107  . insulin Stopped (08/18/18 0343)    Data Reviewed: I have personally reviewed following labs and imaging studies  CBC: Recent Labs  Lab 08/17/18 1953 08/17/18 2118 08/18/18 0249  WBC 6.7 6.4 6.5  NEUTROABS 4.7 4.6  --   HGB 10.2* 10.0* 9.6*  HCT 31.4* 29.8* 29.3*  MCV 89.5 89.0 88.5  PLT 222 218 161   Basic Metabolic Panel: Recent Labs  Lab 08/17/18 1953 08/17/18 2118 08/18/18 0249  NA 128* 131* 136  K 3.9 4.0 4.0  CL 92* 99 104  CO2 21* 21* 22  GLUCOSE 604* 447* 227*  BUN 37* 34* 32*  CREATININE 1.85* 1.58* 1.50*  CALCIUM 9.6 9.3 9.2   GFR: CrCl cannot be calculated (Unknown ideal weight.). Liver Function Tests: Recent Labs  Lab 08/17/18 1953 08/18/18 0249  AST 362*  307*  ALT 495* 464*  ALKPHOS 377* 345*  BILITOT 1.0 1.0  PROT 8.4* 7.4  ALBUMIN 3.3* 2.8*   No results for input(s): LIPASE, AMYLASE in the last 168 hours. Recent Labs  Lab 08/17/18 1953  AMMONIA 32   Coagulation Profile: Recent Labs  Lab 08/18/18 0340  INR 1.02   Cardiac Enzymes: No results for input(s): CKTOTAL, CKMB, CKMBINDEX, TROPONINI in the last 168 hours. BNP (last 3 results) No results for input(s): PROBNP in the last 8760 hours. HbA1C: No results for input(s): HGBA1C in the last 72 hours. CBG: Recent Labs  Lab 08/18/18 0227 08/18/18 0336 08/18/18 0440 08/18/18 0604 08/18/18 0801  GLUCAP 241* 146* 120* 140* 184*   Lipid Profile: No results for input(s): CHOL, HDL, LDLCALC, TRIG, CHOLHDL, LDLDIRECT in the last 72 hours. Thyroid Function Tests: No results for input(s): TSH, T4TOTAL, FREET4, T3FREE, THYROIDAB in the last 72 hours. Anemia Panel: No results for input(s): VITAMINB12, FOLATE, FERRITIN, TIBC, IRON, RETICCTPCT in the last 72 hours. Sepsis Labs: Recent Labs  Lab 08/17/18 1953 08/17/18 2323 08/18/18 0240 08/18/18 0340  LATICACIDVEN 2.5* 2.2* 2.0* 1.7    No results found for this or any previous visit (from the past 240 hour(s)).  Radiology Studies: US Abdomen Complete  Result Date: 08/17/2018 CLINICAL DATA:  Right upper quadrant pain EXAM: ABDOMEN ULTRASOUND COMPLETE COMPARISON:  CT abdomen pelvis 05/01/2018 FINDINGS: Gallbladder: No gallstones or wall thickening visualized. No sonographic Murphy sign noted by sonographer. Common bile duct: Diameter: 3 mm Liver: No focal lesion identified. Within normal limits in parenchymal echogenicity, despite abnormal appearance on prior CT. Portal vein is patent on color Doppler imaging with normal direction of blood flow towards the liver. IVC: No abnormality visualized. Pancreas: Visualized portion unremarkable. Spleen: Size and appearance within normal limits. Right Kidney: Length: 11.3 cm.  Echogenicity within normal limits. No mass or hydronephrosis visualized. Left Kidney: Length: 11.9 cm. Poorly visualized due to overlying bowel gas Abdominal aorta: No aneurysm visualized. Other findings: None. IMPRESSION: 1. No cholelithiasis or other evidence of acute cholecystitis. 2. No acute abnormality of the abdomen. Electronically Signed   By: Ulyses Jarred M.D.   On: 08/17/2018 22:33      LOS: 0 days   Time spent: More than 50% of that time was spent in counseling and/or coordination of care.  Antonieta Pert, MD [facility]  08/18/2018, 11:06 AM

## 2018-08-18 NOTE — ED Notes (Signed)
Notified MD Niu of CBG 241. Will order lantus at this time and continue insulin drip until CBG <200.

## 2018-08-19 ENCOUNTER — Other Ambulatory Visit: Payer: Self-pay

## 2018-08-19 DIAGNOSIS — I5042 Chronic combined systolic (congestive) and diastolic (congestive) heart failure: Secondary | ICD-10-CM | POA: Diagnosis not present

## 2018-08-19 DIAGNOSIS — R945 Abnormal results of liver function studies: Secondary | ICD-10-CM | POA: Diagnosis not present

## 2018-08-19 DIAGNOSIS — N179 Acute kidney failure, unspecified: Secondary | ICD-10-CM

## 2018-08-19 DIAGNOSIS — I959 Hypotension, unspecified: Secondary | ICD-10-CM

## 2018-08-19 DIAGNOSIS — I10 Essential (primary) hypertension: Secondary | ICD-10-CM

## 2018-08-19 DIAGNOSIS — E1100 Type 2 diabetes mellitus with hyperosmolarity without nonketotic hyperglycemic-hyperosmolar coma (NKHHC): Secondary | ICD-10-CM | POA: Diagnosis not present

## 2018-08-19 DIAGNOSIS — E785 Hyperlipidemia, unspecified: Secondary | ICD-10-CM

## 2018-08-19 DIAGNOSIS — E872 Acidosis: Secondary | ICD-10-CM

## 2018-08-19 DIAGNOSIS — R740 Nonspecific elevation of levels of transaminase and lactic acid dehydrogenase [LDH]: Secondary | ICD-10-CM

## 2018-08-19 LAB — COMPREHENSIVE METABOLIC PANEL
ALT: 395 U/L — ABNORMAL HIGH (ref 0–44)
AST: 335 U/L — ABNORMAL HIGH (ref 15–41)
Albumin: 2.8 g/dL — ABNORMAL LOW (ref 3.5–5.0)
Alkaline Phosphatase: 331 U/L — ABNORMAL HIGH (ref 38–126)
Anion gap: 11 (ref 5–15)
BUN: 18 mg/dL (ref 8–23)
CO2: 21 mmol/L — ABNORMAL LOW (ref 22–32)
Calcium: 9.6 mg/dL (ref 8.9–10.3)
Chloride: 107 mmol/L (ref 98–111)
Creatinine, Ser: 0.99 mg/dL (ref 0.44–1.00)
GFR calc Af Amer: >60 mL/min (ref >60)
GFR calc non Af Amer: 59 mL/min — ABNORMAL LOW (ref >60)
Glucose, Bld: 129 mg/dL — ABNORMAL HIGH (ref 70–99)
Potassium: 3.8 mmol/L (ref 3.5–5.1)
Sodium: 139 mmol/L (ref 135–145)
Total Bilirubin: 1 mg/dL (ref 0.3–1.2)
Total Protein: 7.2 g/dL (ref 6.5–8.1)

## 2018-08-19 LAB — HEPATITIS PANEL, ACUTE
HCV Ab: <0.1 s/co ratio (ref 0.0–0.9)
Hep A IgM: NEGATIVE
Hep B C IgM: NEGATIVE
Hepatitis B Surface Ag: NEGATIVE

## 2018-08-19 LAB — UREA NITROGEN, URINE: Urea Nitrogen, Ur: 192 mg/dL

## 2018-08-19 LAB — URINE CULTURE: Culture: <10000 — AB

## 2018-08-19 LAB — GLUCOSE, CAPILLARY
Glucose-Capillary: 104 mg/dL — ABNORMAL HIGH (ref 70–99)
Glucose-Capillary: 215 mg/dL — ABNORMAL HIGH (ref 70–99)
Glucose-Capillary: 173 mg/dL — ABNORMAL HIGH (ref 70–99)
Glucose-Capillary: 147 mg/dL — ABNORMAL HIGH (ref 70–99)
Glucose-Capillary: 138 mg/dL — ABNORMAL HIGH (ref 70–99)

## 2018-08-19 MED ORDER — HYDROXYZINE HCL 10 MG PO TABS
10.0000 mg | ORAL_TABLET | Freq: Three times a day (TID) | ORAL | Status: DC | PRN
  Administered 2018-08-19 – 2018-08-21 (×6): 10 mg via ORAL
  Filled 2018-08-19 (×7): qty 1

## 2018-08-19 MED ORDER — INSULIN ASPART 100 UNIT/ML ~~LOC~~ SOLN
5.0000 [IU] | Freq: Three times a day (TID) | SUBCUTANEOUS | Status: DC
  Administered 2018-08-19 – 2018-08-24 (×16): 5 [IU] via SUBCUTANEOUS

## 2018-08-19 NOTE — Progress Notes (Signed)
Inpatient Diabetes Program Recommendations  AACE/ADA: New Consensus Statement on Inpatient Glycemic Control (2015)  Target Ranges:  Prepandial:   less than 140 mg/dL      Peak postprandial:   less than 180 mg/dL (1-2 hours)      Critically ill patients:  140 - 180 mg/dL   Results for Gloria [name], Gloria [name] (MRN [id number]) as of 08/19/2018 15:18  Ref. Range 08/19/2018 07:25 08/19/2018 11:19  Glucose-Capillary Latest Ref Range: 70 - 99 mg/dL 104 (H) 215 (H)   Diabetes history: Type 2 DM Outpatient Diabetes medications: Novolog 2-10 units TID, Lantus 25 units QHS Current orders for Inpatient glycemic control: Lantus 25 units QHS, Novolog 0-9 units TID, Novolog 3 units tid meal coverage  Inpatient Diabetes Program Recommendations:    Glucose increased after breakfast into the 200 range with Novolog 3 units meal coverage. Consider increasing meal coverage to Novolog 5 units tid.  Thanks, Tama Headings RN, MSN, BC-ADM Inpatient Diabetes Coordinator Team Pager [phone] (8a-5p)

## 2018-08-19 NOTE — Progress Notes (Signed)
0530 Pt sleeping, easy to arouse, pt medicated per MAR. Pt denies pain, SOB. No needs at this time, fall precautions in place, WCTM.

## 2018-08-19 NOTE — Progress Notes (Signed)
2300 Report received from off-going RN, pt sleeping, easy to arouse, NAD, fall precautions in place, WCTM.

## 2018-08-19 NOTE — Progress Notes (Signed)
PROGRESS NOTE   Gloria [name]  QVZ:[id number]    DOB: Nov 09, 1950    DOA: 08/17/2018  PCP: Biagio Borg, MD   I have briefly reviewed patients previous medical records in [facility].  Brief Narrative:  68 year old female, lives alone and independent, PMH of HTN, HLD, DM, asthma, depression, chronic systolic CHF, LVEF 32%, migraine headaches, OSA not on CPAP, cirrhosis, recently hospitalized 1/17-1/19 due to [facility] in DM1, presented with 1 week history of generalized weakness.  Transiently hypotensive in ED 84/52, normalized after IV fluid bolus, also noted in the ED with blood sugar 604 but not in DKA, creatinine 1.85, BUN 37 and mild transaminitis.  She was diagnosed with HONK in type II DM/IDDM, poorly controlled, briefly placed on IV insulin drip and then transitioned to Lantus and NovoLog mealtime and SSI.  Adjusting insulins.  Evaluating transaminitis.  DC to SNF pending clinical improvement.   Assessment & Plan:   Principal Problem:   Hyperosmolar non-ketotic state in patient with type 2 diabetes mellitus ([place]) Active Problems:   Hyperlipidemia   Anxiety state   Essential hypertension   Uncontrolled type 2 diabetes mellitus with diabetic neuropathy, without long-term current use of insulin (HCC)   Cirrhosis of liver with ascites (HCC)   Chronic combined systolic (congestive) and diastolic (congestive) heart failure (HCC)   Abnormal LFTs   Hypotension   AKI (acute kidney injury) ([place])   Lactic acidosis   1. Hyperosmolar nonketotic state in patient with poorly controlled type II DM/IDDM: A1c 10 on 07/18/2018.  Likely precipitated by dietary noncompliance-patient drinks lots of regular sodas.  Briefly placed on IV insulin drip, as CBGs improved, transition to Lantus 25 units at bedtime, NovoLog SSI and mealtime.  DM coordinator and dietitian input appreciated.  Patient counseled extensively regarding compliance with all aspects of her DM care and she verbalized understanding.   Increase NovoLog mealtime to 5 units.  Monitor closely. 2. Acute kidney injury: Secondary to dehydration from hyperglycemia.  Resolved after IV fluid hydration. 3. Hypotension: Present on admission.  Due to dehydration.  Resolved after IV fluids. 4. Lactic acidosis: Secondary to dehydration and hypotension.  Resolved. 5. Dehydration with hyponatremia: Secondary to hyperglycemia.  Resolved. 6. Transaminitis: No GI symptoms reported.  Could be related to shock liver from hypotension on admission.  Has had paracentesis x2 back in November thought to be cardiogenic ascites.  Abdominal ultrasound 2/17 without acute findings and shows normal liver parenchymal echotexture and portal vein flow.  Acute hepatitis panel negative.  LFTs slowly improving.  Statins on hold. 7. Generalized weakness/deconditioning: PT and OT recommend SNF.  Patient reluctant but agreeable.  CT head requested by son and no acute findings. 8. Chronic combined systolic and diastolic CHF: TTE on 9/51/8841: LVEF 45-50% compared to 05/2018 where LVEF 25%.  Seen by cardiology in office on 07/18/2018 and torsemide reduced.  Repeat echo planned on 2/29.  Currently compensated.  Entresto, Aldactone and torsemide currently on hold due to recent hypotension and acute kidney injury.  Continue carvedilol.  Consider discussing with cardiology prior to resuming some of her medications. 9. Hyperlipidemia: Statins held due to elevated LFTs. 10. Anxiety state: Stable.  Hydroxyzine started for pruritus should help. 11. Essential hypertension: Hypotensive on admission, resolved.  Now controlled on carvedilol 3.125 mg twice daily. 12. Pruritus: Unclear etiology.  PRN Vistaril. 13. Normocytic anemia: Unclear etiology but stable.   DVT prophylaxis: Subcutaneous heparin Code Status: Full Family Communication: None at bedside Disposition: DC to SNF pending clinical  improvement and bed availability   Consultants:  None  Procedures:   None  Antimicrobials:  None   Subjective: Seen this morning when she reported no complaints.  States that she checks her CBGs 4 times a day at home and CBGs range between 300s-590.  Volunteers to drinking sodas-Coke, Pepsi etc. liberally.  Claims compliance to insulins.  No dizziness or lightheadedness, chest pain or dyspnea reported.  She denies GI symptoms.  Subsequently in the afternoon, RN reported generalized pruritus without rash.  ROS: As above, otherwise negative.  Objective:  Vitals:   08/18/18 2015 08/19/18 0458 08/19/18 0531 08/19/18 0908  BP: 136/61 (!) 144/58 (!) 144/58 124/63  Pulse: 66 (!) 59 (!) 59 69  Resp: 18 16 16 18   Temp: 98.7 F (37.1 C) 98.7 F (37.1 C) 98.7 F (37.1 C) 98.2 F (36.8 C)  TempSrc: Oral Oral Oral Oral  SpO2: 99% 98%  95%  Weight:  75.1 kg 75.1 kg   Height:   5\' 7"  (1.702 m)     Examination:  General exam: Pleasant middle-aged female, moderately built and nourished, lying comfortably propped up in bed without distress.  Oral mucosa moist. Respiratory system: Clear to auscultation. Respiratory effort normal. Cardiovascular system: S1 & S2 heard, RRR. No JVD, murmurs, rubs, gallops or clicks. No pedal edema.  Telemetry personally reviewed: Sinus rhythm. Gastrointestinal system: Abdomen is nondistended, soft and nontender. No organomegaly or masses felt. Normal bowel sounds heard. Central nervous system: Alert and oriented. No focal neurological deficits. Extremities: Symmetric 5 x 5 power. Skin: No rashes, lesions or ulcers Psychiatry: Judgement and insight appear somewhat impaired. Mood & affect appropriate.     Data Reviewed: I have personally reviewed following labs and imaging studies  CBC: Recent Labs  Lab 08/17/18 1953 08/17/18 2118 08/18/18 0249  WBC 6.7 6.4 6.5  NEUTROABS 4.7 4.6  --   HGB 10.2* 10.0* 9.6*  HCT 31.4* 29.8* 29.3*  MCV 89.5 89.0 88.5  PLT 222 218 161   Basic Metabolic Panel: Recent Labs  Lab  08/17/18 1953 08/17/18 2118 08/18/18 0249 08/19/18 0431  NA 128* 131* 136 139  K 3.9 4.0 4.0 3.8  CL 92* 99 104 107  CO2 21* 21* 22 21*  GLUCOSE 604* 447* 227* 129*  BUN 37* 34* 32* 18  CREATININE 1.85* 1.58* 1.50* 0.99  CALCIUM 9.6 9.3 9.2 9.6   Liver Function Tests: Recent Labs  Lab 08/17/18 1953 08/18/18 0249 08/19/18 0431  AST 362* 307* 335*  ALT 495* 464* 395*  ALKPHOS 377* 345* 331*  BILITOT 1.0 1.0 1.0  PROT 8.4* 7.4 7.2  ALBUMIN 3.3* 2.8* 2.8*   Coagulation Profile: Recent Labs  Lab 08/18/18 0340  INR 1.02   CBG: Recent Labs  Lab 08/18/18 1125 08/18/18 1609 08/18/18 2210 08/19/18 0725 08/19/18 1119  GLUCAP 188* 369* 213* 104* 215*    Recent Results (from the past 240 hour(s))  Blood Culture (routine x 2)     Status: None (Preliminary result)   Collection Time: 08/17/18  8:07 PM  Result Value Ref Range Status   Specimen Description BLOOD RIGHT ARM  Final   Special Requests   Final    BOTTLES DRAWN AEROBIC AND ANAEROBIC Blood Culture results may not be optimal due to an inadequate volume of blood received in culture bottles   Culture   Final    NO GROWTH 2 DAYS Performed at [facility] [address], [place], [place] [address]    Report Status  PENDING  Incomplete  Blood Culture (routine x 2)     Status: None (Preliminary result)   Collection Time: 08/17/18  8:37 PM  Result Value Ref Range Status   Specimen Description BLOOD RIGHT FOREARM  Final   Special Requests   Final    BOTTLES DRAWN AEROBIC ONLY Blood Culture results may not be optimal due to an inadequate volume of blood received in culture bottles   Culture   Final    NO GROWTH 2 DAYS Performed at [facility], [place] [address], [place], [place] [address]    Report Status PENDING  Incomplete  Urine Culture     Status: Abnormal   Collection Time: 08/17/18  9:55 PM  Result Value Ref Range Status   Specimen Description URINE, CATHETERIZED  Final   Special Requests NONE   Final   Culture (A)  Final    <10,000 COLONIES/mL INSIGNIFICANT GROWTH Performed at [facility], [place] [address], [place], [place] [address]    Report Status 08/19/2018 FINAL  Final         Radiology Studies: Ct Head Wo Contrast  Result Date: 08/18/2018 CLINICAL DATA:  68 y/o  F; generalized muscle weakness. EXAM: CT HEAD WITHOUT CONTRAST TECHNIQUE: Contiguous axial images were obtained from the base of the skull through the vertex without intravenous contrast. COMPARISON:  None. FINDINGS: Brain: No evidence of acute infarction, hemorrhage, hydrocephalus, extra-axial collection or mass lesion/mass effect. Small chronic lacunar infarct within the left basal ganglia. Non-specific white matter hypodensities are compatible with mild chronic microvascular ischemic changes. Mild volume loss of brain. Vascular: No hyperdense vessel or unexpected calcification. Skull: Normal. Negative for fracture or focal lesion. Sinuses/Orbits: No acute finding. Other: None. IMPRESSION: 1. No acute intracranial abnormality. 2. Mild chronic microvascular ischemic changes and volume loss of the brain. Electronically Signed   By: Kristine Garbe M.D.   On: 08/18/2018 16:45   US Abdomen Complete  Result Date: 08/17/2018 CLINICAL DATA:  Right upper quadrant pain EXAM: ABDOMEN ULTRASOUND COMPLETE COMPARISON:  CT abdomen pelvis 05/01/2018 FINDINGS: Gallbladder: No gallstones or wall thickening visualized. No sonographic Murphy sign noted by sonographer. Common bile duct: Diameter: 3 mm Liver: No focal lesion identified. Within normal limits in parenchymal echogenicity, despite abnormal appearance on prior CT. Portal vein is patent on color Doppler imaging with normal direction of blood flow towards the liver. IVC: No abnormality visualized. Pancreas: Visualized portion unremarkable. Spleen: Size and appearance within normal limits. Right Kidney: Length: 11.3 cm. Echogenicity within normal limits. No mass or  hydronephrosis visualized. Left Kidney: Length: 11.9 cm. Poorly visualized due to overlying bowel gas Abdominal aorta: No aneurysm visualized. Other findings: None. IMPRESSION: 1. No cholelithiasis or other evidence of acute cholecystitis. 2. No acute abnormality of the abdomen. Electronically Signed   By: Ulyses Jarred M.D.   On: 08/17/2018 22:33        Scheduled Meds: . carvedilol  3.125 mg Oral BID WC  . gabapentin  100 mg Oral TID  . heparin  5,000 Units Subcutaneous Q8H  . insulin aspart  0-9 Units Subcutaneous TID WC  . insulin aspart  3 Units Subcutaneous TID WC  . insulin glargine  25 Units Subcutaneous QHS   Continuous Infusions: . insulin Stopped (08/18/18 0343)     LOS: 0 days     Vernell Leep, MD, FACP, [facility]. [facility]  To contact the attending provider between 7A-7P or the covering provider during after hours 7P-7A, please log into the web  site [email or web address] and access using universal [place] password for that web site. If you do not have the password, please call the hospital operator.  08/19/2018, 3:51 PM

## 2018-08-19 NOTE — Evaluation (Signed)
Occupational Therapy Evaluation Patient Details Name: Gloria [name] MRN: [id number] DOB: Feb 15, 1951 Today's Date: 08/19/2018    History of Present Illness Gloria [name] is a 68 y.o. female with medical history significant of hypertension, hyperlipidemia, diabetes mellitus, asthma, depression, CHF with EF of 45%, migraine headache, OSA not on CPAP, cirrhosis, who presents with generalized weakness.   Clinical Impression   Pt with decline in function and safety with ADLs and ADL mobility with decreased strength, balance and endurance. PTA pt lived at home alone and was independent with ADLs, IADLs and driving; no AD for mobility. Pt son works during the day and is unable to provide safe level of assist or supervision. Pt apprehensive about going to SNF for ST rehab, OT educated pt on process and believes that pt will do very well in that post acute setting to return home safely. Pt would benefit from acute OT services to address impairments to maximize level of function and safety    Follow Up Recommendations  SNF    Equipment Recommendations  Other (comment)(TBD at next venue of care)    Recommendations for Other Services       Precautions / Restrictions Precautions Precautions: Fall Restrictions Weight Bearing Restrictions: No      Mobility Bed Mobility Overal bed mobility: Needs Assistance Bed Mobility: Supine to Sit;Sit to Supine     Supine to sit: Supervision Sit to supine: Supervision      Transfers Overall transfer level: Needs assistance   Transfers: Sit to/from Stand Sit to Stand: Min assist         General transfer comment: cues for hand placement/transfer safety    Balance Overall balance assessment: Needs assistance Sitting-balance support: Feet supported Sitting balance-Leahy Scale: Fair     Standing balance support: Bilateral upper extremity supported Standing balance-Leahy Scale: Poor                             ADL either performed  or assessed with clinical judgement   ADL Overall ADL's : Needs assistance/impaired     Grooming: Wash/dry hands;Wash/dry face;Standing;Min guard   Upper Body Bathing: Min guard;Sitting   Lower Body Bathing: Minimal assistance;Sit to/from stand   Upper Body Dressing : Min guard;Sitting   Lower Body Dressing: Minimal assistance;Sit to/from stand   Toilet Transfer: Minimal assistance;Ambulation;RW;Comfort height toilet   Toileting- Clothing Manipulation and Hygiene: Sit to/from stand;Min guard   Tub/ Shower Transfer: Minimal assistance;Rolling walker;Ambulation;3 in 1;Grab bars   Functional mobility during ADLs: Minimal assistance;Rolling walker;Cueing for safety       Vision Patient Visual Report: No change from baseline       Perception     Praxis      Pertinent Vitals/Pain Pain Assessment: No/denies pain     Hand Dominance Right   Extremity/Trunk Assessment Upper Extremity Assessment Upper Extremity Assessment: Generalized weakness   Lower Extremity Assessment Lower Extremity Assessment: Defer to PT evaluation   Cervical / Trunk Assessment Cervical / Trunk Assessment: Normal   Communication Communication Communication: No difficulties   Cognition Arousal/Alertness: Awake/alert Behavior During Therapy: WFL for tasks assessed/performed Overall Cognitive Status: Within Functional Limits for tasks assessed                                     General Comments       Exercises     Shoulder Instructions  Home Living Family/patient expects to be discharged to:: Private residence Living Arrangements: Alone Available Help at Discharge: Family;Available PRN/intermittently Type of Home: House Home Access: Level entry     Home Layout: One level     Bathroom Shower/Tub: Teacher, early years/pre: Handicapped height     Home Equipment: Environmental consultant - 2 wheels          Prior Functioning/Environment Level of Independence:  Independent                 OT Problem List: Decreased strength;Decreased activity tolerance;Decreased knowledge of use of DME or AE;Impaired balance (sitting and/or standing)      OT Treatment/Interventions: Self-care/ADL training;Therapeutic exercise;DME and/or AE instruction;Therapeutic activities;Patient/family education    OT Goals(Current goals can be found in the care plan section) Acute Rehab OT Goals Patient Stated Goal: go home OT Goal Formulation: With patient Time For Goal Achievement: 09/02/18 Potential to Achieve Goals: Good ADL Goals Pt Will Perform Grooming: with supervision;with set-up;standing Pt Will Perform Upper Body Bathing: with supervision;with set-up;sitting Pt Will Perform Lower Body Bathing: with min guard assist;sit to/from stand Pt Will Perform Upper Body Dressing: with supervision;with set-up;sitting Pt Will Perform Lower Body Dressing: with min guard assist;sit to/from stand Pt Will Transfer to Toilet: with min guard assist;with supervision;ambulating Pt Will Perform Toileting - Clothing Manipulation and hygiene: with supervision;sit to/from stand  OT Frequency: Min 2X/week   Barriers to D/C: Decreased caregiver support          Co-evaluation              AM-PAC OT "6 Clicks" Daily Activity     Outcome Measure Help from another person eating meals?: None Help from another person taking care of personal grooming?: A Little Help from another person toileting, which includes using toliet, bedpan, or urinal?: A Little Help from another person bathing (including washing, rinsing, drying)?: A Little Help from another person to put on and taking off regular upper body clothing?: A Little Help from another person to put on and taking off regular lower body clothing?: A Little 6 Click Score: 19   End of Session Equipment Utilized During Treatment: Gait belt;Other (comment)(3 in 1 over toilet)  Activity Tolerance: Patient tolerated treatment  well Patient left: in bed;with nursing/sitter in room  OT Visit Diagnosis: Other abnormalities of gait and mobility (R26.89);Muscle weakness (generalized) (M62.81)                Time: 1749-4496 OT Time Calculation (min): 26 min Charges:  OT General Charges $OT Visit: 1 Visit OT Evaluation $OT Eval Moderate Complexity: 1 Mod    Britt Bottom 08/19/2018, 12:42 PM

## 2018-08-19 NOTE — Plan of Care (Addendum)
Nutrition Education Note  RD consult for Diabetes Education.   Lab Results  Component Value Date   HGBA1C 10.0 (H) 07/18/2018    Handouts given to the Pt were provided from [facility] Nutrition Care Manual and included:  "Carbohydrate counting for people with diabetes, Diabetes Label reading Tips, and Using Nutrition Labels: carbohydrate".  Reviewed diet, Pt wasn't very forthcoming on typical diet, but states she ate typical things like spaghetti, meatloaf, and drank soda. When inquired about soda, Pt stated she probably drank 12-8oz regular sodas a day. Points discussed from education was carb counting and eating a diet with balanced meals throughout the day. Discussed 1 CHO serving was equal to 15g of Carbohydrates, and each meal should be between 3-5 servings while snacks 1-2 servings. Reviewed Food label.  Used her diet coke as an example discussing the food label, then used the handout to represent a regular soda label.  Emphasized greatly reducing or eliminating the sugar sweetened sodas in addition to other sugar sweetened beverages. That she should switch to Diet soda if she still wanted soda in her diet. Reviewed sources of carbohydrates in the diet. Teach back method used. Had pt express what kinds of foods had carbohydrates and where her biggest source of carbohydrates in her diet were coming from.   Pt expected compliance is good. Pt would benefit from further outpatient education as pt reports as being diagnosed with DM over 1 year ago with no prior education.  Pt was agreeable for referral to outpatient diabetes education at [place].    Body mass index is 25.93 kg/m.   Pt is on HH/CHO Mod Diet. Pt meal completion is 75-100%  Labs and medications reviewed. No further nutrition interventions at this time. If further nutrition needs arise, please consult RD.   Herma Carson, [place] Dietetic Intern

## 2018-08-20 DIAGNOSIS — I10 Essential (primary) hypertension: Secondary | ICD-10-CM | POA: Diagnosis not present

## 2018-08-20 DIAGNOSIS — E785 Hyperlipidemia, unspecified: Secondary | ICD-10-CM | POA: Diagnosis not present

## 2018-08-20 DIAGNOSIS — E1100 Type 2 diabetes mellitus with hyperosmolarity without nonketotic hyperglycemic-hyperosmolar coma (NKHHC): Secondary | ICD-10-CM | POA: Diagnosis not present

## 2018-08-20 DIAGNOSIS — R945 Abnormal results of liver function studies: Secondary | ICD-10-CM | POA: Diagnosis not present

## 2018-08-20 LAB — GLUCOSE, CAPILLARY
Glucose-Capillary: 83 mg/dL (ref 70–99)
Glucose-Capillary: 115 mg/dL — ABNORMAL HIGH (ref 70–99)
Glucose-Capillary: 154 mg/dL — ABNORMAL HIGH (ref 70–99)
Glucose-Capillary: 201 mg/dL — ABNORMAL HIGH (ref 70–99)

## 2018-08-20 LAB — HEPATIC FUNCTION PANEL
ALT: 403 U/L — ABNORMAL HIGH (ref 0–44)
AST: 373 U/L — ABNORMAL HIGH (ref 15–41)
Albumin: 2.9 g/dL — ABNORMAL LOW (ref 3.5–5.0)
Alkaline Phosphatase: 334 U/L — ABNORMAL HIGH (ref 38–126)
Bilirubin, Direct: 0.4 mg/dL — ABNORMAL HIGH (ref 0.0–0.2)
Indirect Bilirubin: 0.6 mg/dL (ref 0.3–0.9)
Total Bilirubin: 1 mg/dL (ref 0.3–1.2)
Total Protein: 7.9 g/dL (ref 6.5–8.1)

## 2018-08-20 MED ORDER — INSULIN GLARGINE 100 UNIT/ML ~~LOC~~ SOLN
22.0000 [IU] | Freq: Every day | SUBCUTANEOUS | Status: DC
  Administered 2018-08-20 – 2018-08-23 (×4): 22 [IU] via SUBCUTANEOUS
  Filled 2018-08-20 (×5): qty 0.22

## 2018-08-20 NOTE — Clinical Social Work Note (Signed)
Clinical Social Work Assessment  Patient Details  Name: Gloria [name] MRN: [id number] Date of Birth: 01/21/1951  Date of referral:  08/20/18               Reason for consult:  Facility Placement                Permission sought to share information with:  Family Supports Permission granted to share information::  Yes, Verbal Permission Granted  Name::     Gloria [name]  Agency::     Relationship::  Son  Contact Information:  [phone]  Housing/Transportation Living arrangements for the past 2 months:  Single Family Home Source of Information:  Patient Patient Interpreter Needed:  None Criminal Activity/Legal Involvement Pertinent to Current Situation/Hospitalization:  No - Comment as needed Significant Relationships:  Adult Children, Siblings Lives with:  Self Do you feel safe going back to the place where you live?  No Need for family participation in patient care:  Yes (Comment)  Care giving concerns: Patient lives alone and is in agreement with ST rehab prior to returning home.  Social Worker assessment / plan:  CSW talked with patient at the bedside regarding discharge disposition and recommendation of ST rehab. Ms. Gerstenberger was sitting in a chair and was alert, oriented and agreeable to talking with CSW. Patient expressed agreement with going to ST rehab and when asked, responded that she has never been to a facility before. Ms. Pond disclosed that shortly before coming to the hospital, she had been falling a lot. The SNF search process was explained and Ms. Almendariz was provided with a skilled facility list.  Employment status:  Retired Nurse, adult, Medicaid In [place] PT Recommendations:  [place] / Referral to community resources:  Skilled Nursing Facility(Patient provided with SNF list)  Patient/Family's Response to care:  Patient expressed no concerns regarding her care during hospitalization.  Patient/Family's  Understanding of and Emotional Response to Diagnosis, Current Treatment, and Prognosis: Ms. Villela expressed understanding of her need for rehab and is willing to go. She is hopeful that she will not have to stay at the skilled facility long and this was discussed.  Emotional Assessment Appearance:  Appears stated age Attitude/Demeanor/Rapport:  Engaged Affect (typically observed):  Appropriate, Pleasant Orientation:  Oriented to Self, Oriented to Place, Oriented to  Time, Oriented to Situation Alcohol / Substance use:  Never Used Psych involvement (Current and /or in the community):  No (Comment)  Discharge Needs  Concerns to be addressed:  Discharge Planning Concerns Readmission within the last 30 days:  No Current discharge risk:  None Barriers to Discharge:  No SNF bed, [place], Dasani Crear Bradley, LCSW 08/20/2018, 6:34 PM

## 2018-08-20 NOTE — Progress Notes (Signed)
Physical Therapy Treatment Patient Details Name: Gloria [name] MRN: [id number] DOB: Feb 15, 1951 Today's Date: 08/20/2018    History of Present Illness Gloria [name] is a 68 y.o. female with medical history significant of hypertension, hyperlipidemia, diabetes mellitus, asthma, depression, CHF with EF of 45%, migraine headache, OSA not on CPAP, cirrhosis, who presents with generalized weakness.    PT Comments    Pt progressing towards physical therapy goals. Overall required increased encouragement to participate - moving slow and appeared fatigued today. SNF remains appropriate. Will continue to follow and progress as able per POC.    Follow Up Recommendations  SNF;Supervision/Assistance - 24 hour     Equipment Recommendations  None recommended by PT    Recommendations for Other Services       Precautions / Restrictions Precautions Precautions: Fall Restrictions Weight Bearing Restrictions: No    Mobility  Bed Mobility Overal bed mobility: Needs Assistance Bed Mobility: Supine to Sit;Sit to Supine     Supine to sit: Supervision Sit to supine: Supervision   General bed mobility comments: Pt was able to transition to/from EOB without assistance. Increased time and effort required.   Transfers Overall transfer level: Needs assistance Equipment used: Rolling walker (2 wheeled) Transfers: Sit to/from Stand Sit to Stand: Min guard         General transfer comment: VC's for hand placement on seated surface for safety. Hands on guarding provided for power-up to full stand. Pt was cued for controlled descent back to bed on stand>sit.   Ambulation/Gait Ambulation/Gait assistance: Min assist Gait Distance (Feet): 150 Feet Assistive device: Rolling walker (2 wheeled) Gait Pattern/deviations: Step-through pattern Gait velocity: slower   General Gait Details: VC's for closer walker proximity and general safety. Slow and effortful by end of gait training.    Stairs              Wheelchair Mobility    Modified Rankin (Stroke Patients Only)       Balance Overall balance assessment: Needs assistance Sitting-balance support: Feet supported Sitting balance-Leahy Scale: Fair     Standing balance support: Bilateral upper extremity supported Standing balance-Leahy Scale: Poor Standing balance comment: reliant on the RW due to weakness                            Cognition Arousal/Alertness: Awake/alert Behavior During Therapy: WFL for tasks assessed/performed Overall Cognitive Status: Within Functional Limits for tasks assessed                                        Exercises      General Comments        Pertinent Vitals/Pain Pain Assessment: No/denies pain    Home Living                      Prior Function            PT Goals (current goals can now be found in the care plan section) Acute Rehab PT Goals Patient Stated Goal: go home PT Goal Formulation: With patient Time For Goal Achievement: 08/25/18 Potential to Achieve Goals: Good Progress towards PT goals: Progressing toward goals    Frequency    Min 3X/week      PT Plan Current plan remains appropriate    Co-evaluation  AM-PAC PT "6 Clicks" Mobility   Outcome Measure  Help needed turning from your back to your side while in a flat bed without using bedrails?: A Little Help needed moving from lying on your back to sitting on the side of a flat bed without using bedrails?: A Little Help needed moving to and from a bed to a chair (including a wheelchair)?: A Little Help needed standing up from a chair using your arms (e.g., wheelchair or bedside chair)?: A Little Help needed to walk in hospital room?: A Little Help needed climbing 3-5 steps with a railing? : A Lot 6 Click Score: 17    End of Session Equipment Utilized During Treatment: Gait belt Activity Tolerance: Patient tolerated treatment well Patient  left: in bed;with call bell/phone within reach Nurse Communication: Mobility status PT Visit Diagnosis: Unsteadiness on feet (R26.81);Other abnormalities of gait and mobility (R26.89);Muscle weakness (generalized) (M62.81)     Time: 1344-1400 PT Time Calculation (min) (ACUTE ONLY): 16 min  Charges:  $Gait Training: 8-22 mins                     [name], PT, DPT Acute Rehabilitation Services Pager: [phone] Office: [phone]    [name] 08/20/2018, 2:42 PM

## 2018-08-20 NOTE — Consult Note (Addendum)
[facility] Consult: 1:06 PM 08/20/2018  LOS: 4 days    Referring Provider: Algis Liming MD  Primary Care Physician:  Biagio Borg, MD Primary Gastroenterologist:   Scarlette Shorts, MD    Reason for Consultation: Elevated LFTs.   HPI: Gloria [name] is a 68 y.o. female.  PMH Type 2 diabetes, insulin requiring.  Diabetic neuropathy.  Combined systolic/diastolic CHF.  EF 25% previously; EF 99%, grade 1 diastolic dysfunction per echo 07/18/2018.  ANA +05/2018.  01/2016 colonoscopy.  For history adenomatous colon polyps in 2012.  Multiple diverticula throughout the colon.  Internal hemorrhoids.  Admission 07/17/18 through 07/19/18 with HONK (hyperosmolar, nonketotic state) and diabetic.  Hemoglobin A1c 10.   Readmitted 2/17 with recurrent HONK.  AKI.  Lactic acidosis. Hypotension improved after IVF.  Despite the recent admission she had returned to drinking lots of sugar-containing soda pop. She has been treated with insulin drip transition to scheduled insulin. LFTs elevated.   05/01/18 T bili 2.1, normal alk phos and transaminases.   07/17/2018 T bili 2.3.  Alkaline phosphatase 169.  AST/ALT 25/26. Starting 08/17/2018 T bili 1.0.  Alkaline phosphatase 377.  AST/ALT 362/495 APAP level normal. Hep ABC serologies all negative. Labs from 05/2018 revealed elevated alpha 1 antitrypsin at 202 (rr 101-187).  Positive ANA antibody; homogeneous pattern 1:1280.  Mitochondrial Ab < 20 (normal).  Smooth muscle Ab normal.  Normal Hep ABC serologies.    Abdominal ultrasound 03/2018: fatty liver versus hepatocellular disease with heterogeneous liver echotexture, gallbladder sludge, 3.6 mm CBD, moderate ascites.   CT 05/2018: massive ascites, generalized anasarca, heterogeneous liver likely from passive cardiac congestion.   Ultrasound 05/2018  described liver as cirrhotic.  Uncomplicated gallbladder sludge and small stones, perihepatic ascites. 05/01/2018 6 liter paracentesis> fluid studies negative for SBP.     05/08/2018 4.1 L paracentesis. 08/17/2018 abdominal ultrasound: Within normal limits parenchymal echo in spite of prior imaging studies.  Patent portal vein with normal directional flow.  Patient was seen in consultation in early 05/2018 by Dr. Ardis Hughs and Dr. Bryan Lemma regarding ascites.  They did not feel the imaging study supported a diagnosis of cirrhosis of the liver, with CT showing what looked like "nutmeg liver " (indicative of passive congestion). 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  Synthetic hepatic function was preserved.     Today the patient denies recent anorexia, abdominal pain, dark urine.  Pruritus on her arms and trunk for several months via where she had a skin eruption in November.    Past Medical History:  Diagnosis Date  . Allergic rhinitis, cause unspecified 03/30/2013  . Anxiety state, unspecified   . Arthritis    "knees" (05/01/2018)  . Asthma    hx (05/01/2018)  . CHF (congestive heart failure) ([place])   . Chronic bronchitis   . Depressive disorder, not elsewhere classified   . Difficulty waking    hard to wake up past sedation!  . Migraine, unspecified, without mention of intractable migraine without mention of status migrainosus   . OSA (obstructive sleep apnea) 12/25/2016  . Other  and unspecified hyperlipidemia   . Panic attacks   . Postmenopausal symptoms   . Type 2 diabetes, diet controlled ([place])   . Unspecified essential hypertension   . Unspecified urinary incontinence   . UTI (lower urinary tract infection)    on 02/05/16/on meds    Past Surgical History:  Procedure Laterality Date  . ABDOMINAL HYSTERECTOMY  2002  . DIAGNOSTIC LAPAROSCOPY    . IR PARACENTESIS  05/08/2018  . OPEN REDUCTION SHOULDER DISLOCATION Right   . RIGHT/LEFT HEART CATH AND CORONARY ANGIOGRAPHY  N/A 05/05/2018   Procedure: RIGHT/LEFT HEART CATH AND CORONARY ANGIOGRAPHY;  Surgeon: Belva Crome, MD;  Location: [facility];  Service: Cardiovascular;  Laterality: N/A;  . TUBAL LIGATION    . TUMOR EXCISION Right    "ankle"    Prior to Admission medications   Medication Sig Start Date End Date Taking? Authorizing Provider  ALPRAZolam Duanne Moron) 1 MG tablet Take 1 tablet (1 mg total) by mouth at bedtime as needed for anxiety. 07/22/18  Yes Biagio Borg, MD  atorvastatin (LIPITOR) 40 MG tablet Take 1 tablet (40 mg total) by mouth daily. 07/19/18  Yes Thurnell Lose, MD  carvedilol (COREG) 3.125 MG tablet Take 1 tablet (3.125 mg total) by mouth 2 (two) times daily with a meal. 05/18/18  Yes Tillery, Satira Mccallum, PA-C  feeding supplement, ENSURE ENLIVE, (ENSURE ENLIVE) LIQD Take 237 mLs by mouth 2 (two) times daily between meals. 05/11/18  Yes Irene Pap N, DO  gabapentin (NEURONTIN) 100 MG capsule Take 1 capsule (100 mg total) by mouth 3 (three) times daily. 07/22/18  Yes Biagio Borg, MD  insulin aspart (NOVOLOG) 100 UNIT/ML injection Substitute to any brand approved.Before each meal 3 times a day, 140-199 - 2 units, 200-250 - 4 units, 251-299 - 6 units,  300-349 - 8 units,  350 or above 10 units. Dispense syringes and needles as needed, Ok to switch to PEN if approved. DX DM2, Code E11.65 Patient taking differently: Inject 2-10 Units into the skin See admin instructions. Inject 2-10 units into the skin three times a day before meals: BGL 140-199 = 2 units; 200-250 = 4 units; 251-299 = 6 units; 300-349 = 8 units; 350 or above = 10 units "Dispense syringes and needles as needed/Ok to switch to PEN if approved/DX DM2, Code E11.65/Substitute to any brand approved" 07/19/18  Yes Thurnell Lose, MD  insulin glargine (LANTUS) 100 UNIT/ML injection Inject 0.25 mLs (25 Units total) into the skin at bedtime. Dispense insulin pen if approved, if not dispense as needed syringes and needles for 1  month supply. Can switch to Levemir. Diagnosis E 11.65. 07/19/18  Yes Thurnell Lose, MD  potassium chloride (KLOR-CON 10) 10 MEQ tablet Take 1 tablet (10 mEq total) by mouth 2 (two) times daily. 07/22/18  Yes Biagio Borg, MD  sacubitril-valsartan (ENTRESTO) 49-51 MG Take 1 tablet by mouth 2 (two) times daily. 06/29/18  Yes Shirley Friar, PA-C  spironolactone (ALDACTONE) 25 MG tablet Take 1 tablet (25 mg total) by mouth daily.  History of ascites.  Home meds include Aldactone 25 mg/day Demadex 20 mg/day 05/18/18  Yes Shirley Friar, PA-C  torsemide (DEMADEX) 20 MG tablet Take 1 tablet (20 mg total) by mouth daily. 05/18/18  Yes Shirley Friar, PA-C  ACCU-CHEK AVIVA PLUS test strip USE AS DIRECTED FOUR TIMES DAILY 05/12/18   Biagio Borg, MD  blood glucose meter kit and supplies KIT Dispense based on  patient and insurance preference. Use up to four times daily as directed. (FOR ICD-9 250.00, 250.01). For QAC - HS accuchecks. 07/19/18   Thurnell Lose, MD  camphor-menthol [facility]) lotion Apply 1 application topically 2 (two) times daily. 05/11/18   Kayleen Memos, DO  glucose blood (ACCU-CHEK AVIVA) test strip Use as instructed four times per day 08/26/17   Philemon Kingdom, MD  Insulin Pen Needle (BD PEN NEEDLE NANO U/F) 32G X 4 MM MISC USE ONCE DAILY 08/26/17   Philemon Kingdom, MD  Insulin Syringe-Needle U-100 25G X 1" 1 ML MISC For 4 times a day insulin SQ, 1 month supply. Diagnosis E11.65 07/19/18   Thurnell Lose, MD    Scheduled Meds: . carvedilol  3.125 mg Oral BID WC  . gabapentin  100 mg Oral TID  . heparin  5,000 Units Subcutaneous Q8H  . insulin aspart  0-9 Units Subcutaneous TID WC  . insulin aspart  5 Units Subcutaneous TID WC  . insulin glargine  25 Units Subcutaneous QHS   Infusions:  PRN Meds: ALPRAZolam, dextrose, hydrALAZINE, hydrOXYzine, ondansetron **OR** ondansetron (ZOFRAN) IV, senna-docusate   Allergies as of 08/17/2018 - Review  Complete 08/17/2018  Allergen Reaction Noted  . Compazine Other (See Comments) 11/21/2010  . Glipizide Shortness Of Breath and Other (See Comments) 12/20/2016  . Haloperidol decanoate Other (See Comments) 11/08/2010  . Penicillins Nausea And Vomiting 11/02/2010  . Metformin and related Nausea Only and Other (See Comments) 10/03/2015  . Codeine Itching   . Lisinopril Cough 04/15/2012    Family History  Problem Relation Age of Onset  . Arthritis Other        family history  . Hypertension Other        grandparent  . Colon cancer Maternal Grandfather   . Asthma Son   . Rheum arthritis Mother   . Hypertension Mother   . Diabetes Maternal Uncle   . Diabetes Paternal Uncle   . Diabetes Maternal Grandmother     Social History   Socioeconomic History  . Marital status: Divorced    Spouse name: Not on file  . Number of children: 1  . Years of education: Not on file  . Highest education level: Not on file  Occupational History  . Occupation: RETIRED ---Self employed-interior design/flower arrangements  Social Needs  . Financial resource strain: Not on file  . Food insecurity:    Worry: Not on file    Inability: Not on file  . Transportation needs:    Medical: Not on file    Non-medical: Not on file  Tobacco Use  . Smoking status: Never Smoker  . Smokeless tobacco: Never Used  Substance and Sexual Activity  . Alcohol use: Never    Alcohol/week: 0.0 standard drinks    Frequency: Never  . Drug use: Never  . Sexual activity: Not Currently  Lifestyle  . Physical activity:    Days per week: Not on file    Minutes per session: Not on file  . Stress: Not on file  Relationships  . Social connections:    Talks on phone: Not on file    Gets together: Not on file    Attends religious service: Not on file    Active member of club or organization: Not on file    Attends meetings of clubs or organizations: Not on file    Relationship status: Not on file  . Intimate partner  violence:    Fear of current or ex partner: Not  on file    Emotionally abused: Not on file    Physically abused: Not on file    Forced sexual activity: Not on file  Other Topics Concern  . Not on file  Social History Narrative   -Retired - does Company secretary   -Divorced in 2007   -One son - lives locally. 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 and entertain family. Bowling. Interior decorating       REVIEW OF SYSTEMS: Constitutional: No weakness, no fatigue. ENT:  No nose bleeds Pulm: Shortness of breath, no cough. CV:  No palpitations, no LE edema.  No chest pain GU:  No hematuria, no frequency GI:  Per HPI.  Heme: Denies excessive bleeding or bruising. Transfusions: No previous blood transfusions. Neuro:  No headaches, no peripheral tingling or numbness.  No dizziness, no syncope, no seizures. Derm:  Per HPI Endocrine:  No sweats or chills.  No polyuria or dysuria Immunization: Did not inquire as to recent immunizations. Travel:  None beyond local counties in last few months.    PHYSICAL EXAM: Vital signs in last 24 hours: Vitals:   08/20/18 0507 08/20/18 0904  BP: (!) 141/64 127/60  Pulse: 64 67  Resp: 18 18  Temp: 98.7 F (37.1 C) 98 F (36.7 C)  SpO2: 98% 96%   Wt Readings from Last 3 Encounters:  08/19/18 75.3 kg  07/22/18 74.8 kg  07/17/18 75.9 kg    General: Pleasant, cooperative.  Does not look ill. Head: Facial asymmetry or swelling. Eyes: No scleral icterus.  No conjunctival pallor. Ears: Not hard of hearing Nose: No discharge or congestion. Mouth: Good dentition.  Oral mucosa pink, moist, clear.  Tongue midline. Neck: No JVD, no masses, no thyromegaly. Lungs: Clear bilaterally.  No labored breathing or cough. Heart: RRR.  No MRG. Abdomen: Obese.  Soft.  Not tender or distended.  Active bowel sounds.  No HSM, masses, bruits, hernias.  Rectal: Deferred Musc/Skeltl: No joint redness, swelling or gross  deformity. Extremities: CCE.  Feet are warm. Neurologic: Alert.  Oriented x3.  Moves all 4 limbs, no tremor, no asterixis. Skin: No rashes.  Multiple healed, hyperpigmented lesions on the arms bilaterally.   Tattoos: None observed Nodes: No cervical adenopathy. Psych: Calm, cooperative, pleasant.  Intake/Output from previous day: 02/19 0701 - 02/20 0700 In: 720 [P.O.:720] Out: 2150 [Urine:2150] Intake/Output this shift: Total I/O In: 300 [P.O.:300] Out: -   LAB RESULTS: Recent Labs    08/17/18 1953 08/17/18 2118 08/18/18 0249  WBC 6.7 6.4 6.5  HGB 10.2* 10.0* 9.6*  HCT 31.4* 29.8* 29.3*  PLT 222 218 225   BMET Lab Results  Component Value Date   NA 139 08/19/2018   NA 136 08/18/2018   NA 131 (L) 08/17/2018   K 3.8 08/19/2018   K 4.0 08/18/2018   K 4.0 08/17/2018   CL 107 08/19/2018   CL 104 08/18/2018   CL 99 08/17/2018   CO2 21 (L) 08/19/2018   CO2 22 08/18/2018   CO2 21 (L) 08/17/2018   GLUCOSE 129 (H) 08/19/2018   GLUCOSE 227 (H) 08/18/2018   GLUCOSE 447 (H) 08/17/2018   BUN 18 08/19/2018   BUN 32 (H) 08/18/2018   BUN 34 (H) 08/17/2018   CREATININE 0.99 08/19/2018   CREATININE 1.50 (H) 08/18/2018   CREATININE 1.58 (H) 08/17/2018   CALCIUM 9.6 08/19/2018   CALCIUM 9.2 08/18/2018   CALCIUM 9.3 08/17/2018  LFT Recent Labs    08/18/18 0249 08/19/18 0431 08/20/18 0605  PROT 7.4 7.2 7.9  ALBUMIN 2.8* 2.8* 2.9*  AST 307* 335* 373*  ALT 464* 395* 403*  ALKPHOS 345* 331* 334*  BILITOT 1.0 1.0 1.0  BILIDIR  --   --  0.4*  IBILI  --   --  0.6   PT/INR Lab Results  Component Value Date   INR 1.02 08/18/2018   INR 1.11 05/09/2018   INR 1.43 05/01/2018   Hepatitis Panel Recent Labs    08/18/18 0340  HEPBSAG Negative  HCVAB <0.1  HEPAIGM Negative  HEPBIGM Negative   C-Diff No components found for: CDIFF Lipase     Component Value Date/Time   LIPASE 33 07/17/2018 1704    Drugs of Abuse     Component Value Date/Time   LABOPIA NONE  DETECTED 05/01/2018 1249   COCAINSCRNUR NONE DETECTED 05/01/2018 1249   LABBENZ NONE DETECTED 05/01/2018 1249   AMPHETMU NONE DETECTED 05/01/2018 1249   THCU NONE DETECTED 05/01/2018 1249   LABBARB NONE DETECTED 05/01/2018 1249     RADIOLOGY STUDIES: Ct Head Wo Contrast  Result Date: 08/18/2018 CLINICAL DATA:  68 y/o  F; generalized muscle weakness. EXAM: CT HEAD WITHOUT CONTRAST TECHNIQUE: Contiguous axial images were obtained from the base of the skull through the vertex without intravenous contrast. COMPARISON:  None. FINDINGS: Brain: No evidence of acute infarction, hemorrhage, hydrocephalus, extra-axial collection or mass lesion/mass effect. Small chronic lacunar infarct within the left basal ganglia. Non-specific white matter hypodensities are compatible with mild chronic microvascular ischemic changes. Mild volume loss of brain. Vascular: No hyperdense vessel or unexpected calcification. Skull: Normal. Negative for fracture or focal lesion. Sinuses/Orbits: No acute finding. Other: None. IMPRESSION: 1. No acute intracranial abnormality. 2. Mild chronic microvascular ischemic changes and volume loss of the brain. Electronically Signed   By: Kristine Garbe M.D.   On: 08/18/2018 16:45     IMPRESSION:   *   New acute LFT elevation. Cholestatic pattern with elevated Alk Phos.   Interestingly her ultrasound shows normal liver, biliary tree and no evidence for cholecystitis.  On imaging studies in early 05/2018 she had hepatocellular disease and undewent 2 larger volume paracentesis to address ascites.  LFTs unremarkable at that time.  GI physicians felt the hepatocellular appearance was due to cardiac causes.  Current ultrasound shows no hepatocellular disease and no ascites, yet her LFTs are acutely elevated. Has no sxs to suggest acute or chronic cholecystitis.   APAP level normal. Multiple labs in 05/2018 with +ANA but no other labs to suggest AIH.   No coagulopathy, AT/INR/PTT  normal.   Hepatitis serologies in 05/2018 and 08/18/18 all negative.   Home diuretics include Spironolactone 25 mg/day, Demadex 20 mg/day One of her meds, Entresto, can cause hepatitis but she's been taking this for at least 4 months.    *   Uncontrolled IDDM.  Recurrent HONK.    *   Diastolic, systolic heart failure.  Currently well compensated.    *   Adenomatous colon polyps in 2012.  No recurrence of polyps on colonoscopy 2017.  *   Normocytic anemia.  Baseline hemoglobins 10-11.  Currently 9.6.   PLAN:     *   Per Dr Fuller Plan.   If LFT elevation persists, may need liver biopsy.  Follow CMET.      Azucena Freed  08/20/2018, 1:06 PM Phone [phone]      Attending Physician Note   I  have taken a history, examined the patient and reviewed the chart. I agree with the Advanced Practitioner's note, impression and recommendations.   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 Given serologic evaluation in November which was negative except for ANA positive at 1:1280 autoimmune hepatitis is a concern, however ASMA was negative. Drug induced liver injury is also possible. Consider hold of Entresto. Repeat ASMA and send IgG.  If LFTs remain elevated liver biopsy would be the next step after DM is fully stabilized. This could be monitored with close outpatient follow up with her primary GI physician.    Lucio Edward, MD FACG [phone]

## 2018-08-20 NOTE — [place] FL2 (Signed)
[place] MEDICAID FL2 LEVEL OF CARE SCREENING TOOL     IDENTIFICATION  Patient Name: Gloria [name] Birthdate: 1950-12-10 Sex: female Admission Date (Current Location): 08/17/2018  [place] and [place] Number:  Gloria [name] [id number] [place] and Address:  [place]. [facility], [place] [address] [place], [place] [address]      Provider Number: [id number]  Attending Physician Name and Address:  Modena Jansky, MD  Relative Name and Phone Number:  Gloria [name]    Current Level of Care: Hospital Recommended Level of Care: [place] Prior Approval Number:    Date Approved/Denied:   PASRR Number: (Submitted for PASRR 2/20 - MUST ER#[id number])  Discharge Plan: SNF    Current Diagnoses: Patient Active Problem List   Diagnosis Date Noted  . Chronic combined systolic (congestive) and diastolic (congestive) heart failure ([place]) 08/17/2018  . Abnormal LFTs 08/17/2018  . Hypotension 08/17/2018  . Hyperosmolar non-ketotic state in patient with type 2 diabetes mellitus ([place]) 08/17/2018  . AKI (acute kidney injury) ([place]) 08/17/2018  . Lactic acidosis 08/17/2018  . Hyperglycemia 07/17/2018  . Other ascites   . Acute systolic (congestive) heart failure ([place]) 05/03/2018  . Anasarca 05/03/2018  . Cirrhosis of liver with ascites ([place]) 05/03/2018  . Fluid overload 05/01/2018  . Ascites of liver 03/05/2018  . Abdominal swelling, generalized 02/25/2018  . Mouth pain 02/02/2018  . Rash 02/02/2018  . Peripheral neuropathy 08/26/2017  . Non-intractable vomiting with nausea 05/01/2017  . Acute pain of right knee 04/02/2017  . Syst congestive heart failure with reduced LV function, NYHA class 1 ([place]) 03/19/2017  . OSA (obstructive sleep apnea) 12/25/2016  . Hypersomnolence 11/10/2016  . Positive ANA (antinuclear antibody) 07/11/2016  . Primary osteoarthritis of both hands 07/11/2016  . Fx sacrum/coccyx-closed ([place]) 07/11/2016  . Left arm pain 06/25/2016  .  Polyarthralgia 03/27/2016  . Degenerative arthritis of knee, bilateral 03/27/2016  . UTI (urinary tract infection) 02/28/2016  . Low back pain 02/28/2016  . Coccyxdynia 02/28/2016  . Bilateral knee pain 02/28/2016  . Bilateral ankle pain 02/28/2016  . Encounter for preventative adult health care exam with abnormal findings 10/03/2015  . Peripheral edema 10/03/2015  . Morbid obesity ([name]) 01/01/2015  . 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 Asthma, chronic 10/17/2014  . Dyspnea 03/29/2014  . Upper airway cough syndrome 03/29/2014  . Vocal cord dysfunction 03/21/2014  . Uncontrolled type 2 diabetes mellitus with diabetic neuropathy, without long-term current use of insulin ([name])   . Allergic rhinitis, cause unspecified 03/30/2013  . Insomnia 09/21/2012  . Cough 04/15/2012  . Menopause 02/28/2011  . Hot flashes, menopausal 02/28/2011  . Wheezing 11/02/2010  . Hyperlipidemia 06/18/2010  . ARTHRITIS 06/18/2010  . Anxiety state 06/14/2010  . DEPRESSION 06/14/2010  . Migraine 06/14/2010  . Essential hypertension 06/14/2010    Orientation RESPIRATION BLADDER Height & Weight     Self, Time, Situation, Place  Normal Continent, Indwelling catheter(Urethral catheter placed 08/17/18 for acute urinary retention) Weight: 166 lb 0.1 oz (75.3 kg) Height:  5\' 7"  (170.2 cm)  BEHAVIORAL SYMPTOMS/MOOD NEUROLOGICAL BOWEL NUTRITION STATUS      Continent Diet(Heart healthy)  AMBULATORY STATUS COMMUNICATION OF NEEDS Skin   Limited Assist(Min assist per PT) Verbally Other (Comment)(Rash right/left arms)                       Personal Care Assistance Level of Assistance  Bathing, Feeding, Dressing Bathing Assistance: Limited assistance(Upper body Min guard; Lower body min  assist) Feeding assistance: Limited assistance(Assistance with set-up) Dressing Assistance: Limited assistance(Upper body min guard; Lower body min assist)     Functional Limitations Info  Sight, Hearing,  Speech Sight Info: Adequate Hearing Info: Adequate Speech Info: Adequate    SPECIAL CARE FACTORS FREQUENCY  PT (By licensed PT), OT (By licensed OT)     PT Frequency: Evaluated at hospital 2/18. PT at SNF Eval and Treat OT Frequency: Evaluated at hospital 1/`9. OT at SNF Eval and Treat            Contractures Contractures Info: Not present    Additional Factors Info  Code Status, Allergies Code Status Info: Full Allergies Info: Compazine, Glipizide, Haloperidol, Decanoate, Penicillins, Metformin And Related, Codeine, Lisinopril           Current Medications (08/20/2018):  This is the current hospital active medication list Current Facility-Administered Medications  Medication Dose Route Frequency Provider Last Rate Last Dose  . ALPRAZolam Duanne Moron) tablet 1 mg  1 mg Oral QHS PRN Ivor Costa, MD   1 mg at 08/19/18 2203  . carvedilol (COREG) tablet 3.125 mg  3.125 mg Oral BID WC Ivor Costa, MD   3.125 mg at 08/20/18 0800  . dextrose 50 % solution 50 mL  50 mL Intravenous PRN Ivor Costa, MD      . gabapentin (NEURONTIN) capsule 100 mg  100 mg Oral TID Ivor Costa, MD   100 mg at 08/20/18 0801  . heparin injection 5,000 Units  5,000 Units Subcutaneous Q8H Ivor Costa, MD   5,000 Units at 08/20/18 0536  . hydrALAZINE (APRESOLINE) injection 5 mg  5 mg Intravenous Q2H PRN Ivor Costa, MD      . hydrOXYzine (ATARAX/VISTARIL) tablet 10 mg  10 mg Oral TID PRN Modena Jansky, MD   10 mg at 08/20/18 1142  . insulin aspart (novoLOG) injection 0-9 Units  0-9 Units Subcutaneous TID WC Ivor Costa, MD   2 Units at 08/19/18 1734  . insulin aspart (novoLOG) injection 5 Units  5 Units Subcutaneous TID WC Modena Jansky, MD   5 Units at 08/20/18 1247  . insulin glargine (LANTUS) injection 22 Units  22 Units Subcutaneous QHS Hongalgi, Anand D, MD      . ondansetron (ZOFRAN) tablet 4 mg  4 mg Oral Q6H PRN Ivor Costa, MD       Or  . ondansetron (ZOFRAN) injection 4 mg  4 mg Intravenous Q6H PRN Ivor Costa, MD      . senna-docusate (Senokot-S) tablet 1 tablet  1 tablet Oral QHS PRN Ivor Costa, MD         Discharge Medications: Please see discharge summary for a list of discharge medications.  Relevant Imaging Results:  Relevant Lab Results:   Additional Information ss#[id number]  Sable Feil, LCSW

## 2018-08-20 NOTE — Progress Notes (Signed)
Inpatient Diabetes Program Recommendations  AACE/ADA: New Consensus Statement on Inpatient Glycemic Control (2015)  Target Ranges:  Prepandial:   less than 140 mg/dL      Peak postprandial:   less than 180 mg/dL (1-2 hours)      Critically ill patients:  140 - 180 mg/dL   Lab Results  Component Value Date   GLUCAP 115 (H) 08/20/2018   HGBA1C 10.0 (H) 07/18/2018    Review of Glycemic Control Results for [name], Gloria [name] (MRN [id number]) as of 08/20/2018 12:37  Ref. Range 08/20/2018 07:17 08/20/2018 11:34  Glucose-Capillary Latest Ref Range: 70 - 99 mg/dL 83 115 (H)  Diabetes history: Type 2 DM Outpatient Diabetes medications: Novolog 2-10 units TID, Lantus 25 units QHS Current orders for Inpatient glycemic control: Lantus 25 units QHS, Novolog 0-9 units TID, Novolog 5 units tid with meals  Inpatient Diabetes Program Recommendations:    Fasting CBG= 83 mg/dL. Consider reducing Lantus to 22 units q HS.    Thanks,  Adah Perl, RN, BC-ADM Inpatient Diabetes Coordinator Pager [phone] (8a-5p)

## 2018-08-20 NOTE — Progress Notes (Signed)
PROGRESS NOTE   Gloria [name]  VOJ:[id number]    DOB: 06/10/1951    DOA: 08/17/2018  PCP: Biagio Borg, MD   I have briefly reviewed patients previous medical records in [facility].  Brief Narrative:  68 year old female, lives alone and independent, PMH of HTN, HLD, DM, asthma, depression, chronic systolic CHF, LVEF 99%, migraine headaches, OSA not on CPAP, cirrhosis, recently hospitalized 1/17-1/19 due to [facility] in DM1, presented with 1 week history of generalized weakness.  Transiently hypotensive in ED 84/52, normalized after IV fluid bolus, also noted in the ED with blood sugar 604 but not in DKA, creatinine 1.85, BUN 37 and mild transaminitis.  She was diagnosed with HONK in type II DM/IDDM, poorly controlled, briefly placed on IV insulin drip and then transitioned to Lantus and NovoLog mealtime and SSI.  Adjusting insulins.  Evaluating transaminitis.  DC to SNF pending bed availability and insurance approval.   Assessment & Plan:   Principal Problem:   Hyperosmolar non-ketotic state in patient with type 2 diabetes mellitus ([place]) Active Problems:   Hyperlipidemia   Anxiety state   Essential hypertension   Uncontrolled type 2 diabetes mellitus with diabetic neuropathy, without long-term current use of insulin (HCC)   Cirrhosis of liver with ascites (HCC)   Chronic combined systolic (congestive) and diastolic (congestive) heart failure (HCC)   Abnormal LFTs   Hypotension   AKI (acute kidney injury) ([place])   Lactic acidosis   1. Hyperosmolar nonketotic state in patient with poorly controlled type II DM/IDDM: A1c 10 on 07/18/2018.  Likely precipitated by dietary noncompliance-patient drinks lots of regular sodas.  Briefly placed on IV insulin drip, as CBGs improved, transition to Lantus 25 units at bedtime, NovoLog SSI and mealtime.  DM coordinator and dietitian input appreciated.  Patient counseled extensively regarding compliance with all aspects of her DM care and she verbalized  understanding.  Increase NovoLog mealtime to 5 units.  Much improved glycemic control.  FBG however 83 and at risk for hypoglycemia.  Hence reduced Lantus to 22 units at bedtime, continue NovoLog sensitive SSI without bedtime scale and NovoLog mealtime 5 units 3 times daily. 2. Acute kidney injury: Secondary to dehydration from hyperglycemia.  Resolved after IV fluid hydration. 3. Hypotension: Present on admission.  Due to dehydration.  Resolved after IV fluids. 4. Lactic acidosis: Secondary to dehydration and hypotension.  Resolved. 5. Dehydration with hyponatremia: Secondary to hyperglycemia.  Resolved. 6. Transaminitis: No GI symptoms reported.  Could be related to shock liver from hypotension on admission.  Has had paracentesis x2 back in November thought to be cardiogenic ascites.  Abdominal ultrasound 2/17 without acute findings and shows normal liver parenchymal echotexture and portal vein flow.  Acute hepatitis panel negative.  LFTs had slowly improved but have plateaued compared to yesterday.  I discussed with Dr. Fuller Plan, [facility] and since LFTs not rapidly improving as would have been expected with shock liver, she may have other causes for her LFT abnormalities and he will formally consult and make recommendations.  Hold statins. 7. Generalized weakness/deconditioning: PT and OT recommend SNF.  Patient reluctant but agreeable.  CT head requested by son and no acute findings. 8. Chronic combined systolic and diastolic CHF: TTE on 3/71/6967: LVEF 45-50% compared to 05/2018 where LVEF 25%.  Seen by cardiology in office on 07/18/2018 and torsemide reduced.  Repeat echo planned on 2/29.  Currently compensated.  Entresto, Aldactone and torsemide currently on hold due to recent hypotension and acute kidney injury.  Continue carvedilol.  Consider discussing with cardiology prior to resuming some of her medications. 9. Hyperlipidemia: Statins held due to elevated LFTs. 10. Anxiety state: Stable.   Hydroxyzine started for pruritus should help. 11. Essential hypertension: Hypotensive on admission, resolved.  Now controlled on carvedilol 3.125 mg twice daily. 12. Pruritus: Unclear etiology.  PRN Vistaril.  Seems to have resolved. 13. Normocytic anemia: Unclear etiology but stable.  Follow CBC as outpatient.   DVT prophylaxis: Subcutaneous heparin Code Status: Full Family Communication: None at bedside Disposition: DC to SNF pending GI input, SNF bed availability and insurance clearance.  I discussed with clinical social work who indicated that is less likely for her insurance to come through today.   Consultants:  [facility]-pending.  Procedures:  None  Antimicrobials:  None   Subjective: Patient denied any complaints today.  No pain or itching reported.  As per RN, no acute issues noted.  ROS: As above, otherwise negative.  Objective:  Vitals:   08/19/18 1700 08/19/18 2120 08/20/18 0507 08/20/18 0904  BP: 130/60 (!) 154/65 (!) 141/64 127/60  Pulse: 72 69 64 67  Resp:  18 18 18   Temp: 98 F (36.7 C) 98.7 F (37.1 C) 98.7 F (37.1 C) 98 F (36.7 C)  TempSrc: Oral Oral Oral Oral  SpO2: 96% 97% 98% 96%  Weight:  75.3 kg    Height:        Examination:  General exam: Pleasant middle-aged female, moderately built and nourished, lying comfortably propped up in bed without distress.  Oral mucosa moist.  Stable Respiratory system: Clear to auscultation.  No increased work of breathing.   Cardiovascular system: S1 & S2 heard, RRR. No JVD, murmurs, rubs, gallops or clicks. No pedal edema.  Stable without change Gastrointestinal system: Abdomen is nondistended, soft and nontender. No organomegaly or masses felt. Normal bowel sounds heard.  Stable. Central nervous system: Alert and oriented. No focal neurological deficits.  Stable. Extremities: Symmetric 5 x 5 power. Skin: No rashes, lesions or ulcers Psychiatry: Judgement and insight appear somewhat impaired. Mood &  affect flat.     Data Reviewed: I have personally reviewed following labs and imaging studies  CBC: Recent Labs  Lab 08/17/18 1953 08/17/18 2118 08/18/18 0249  WBC 6.7 6.4 6.5  NEUTROABS 4.7 4.6  --   HGB 10.2* 10.0* 9.6*  HCT 31.4* 29.8* 29.3*  MCV 89.5 89.0 88.5  PLT 222 218 945   Basic Metabolic Panel: Recent Labs  Lab 08/17/18 1953 08/17/18 2118 08/18/18 0249 08/19/18 0431  NA 128* 131* 136 139  K 3.9 4.0 4.0 3.8  CL 92* 99 104 107  CO2 21* 21* 22 21*  GLUCOSE 604* 447* 227* 129*  BUN 37* 34* 32* 18  CREATININE 1.85* 1.58* 1.50* 0.99  CALCIUM 9.6 9.3 9.2 9.6   Liver Function Tests: Recent Labs  Lab 08/17/18 1953 08/18/18 0249 08/19/18 0431 08/20/18 0605  AST 362* 307* 335* 373*  ALT 495* 464* 395* 403*  ALKPHOS 377* 345* 331* 334*  BILITOT 1.0 1.0 1.0 1.0  PROT 8.4* 7.4 7.2 7.9  ALBUMIN 3.3* 2.8* 2.8* 2.9*   Coagulation Profile: Recent Labs  Lab 08/18/18 0340  INR 1.02   CBG: Recent Labs  Lab 08/19/18 1639 08/19/18 2155 08/19/18 2230 08/20/18 0717 08/20/18 1134  GLUCAP 173* 147* 138* 83 115*    Recent Results (from the past 240 hour(s))  Blood Culture (routine x 2)     Status: None (Preliminary result)   Collection Time: 08/17/18  8:07 PM  Result Value Ref Range Status   Specimen Description BLOOD RIGHT ARM  Final   Special Requests   Final    BOTTLES DRAWN AEROBIC AND ANAEROBIC Blood Culture results may not be optimal due to an inadequate volume of blood received in culture bottles   Culture   Final    NO GROWTH 3 DAYS Performed at [facility], [place] [address] [place], [place] [address]    Report Status PENDING  Incomplete  Blood Culture (routine x 2)     Status: None (Preliminary result)   Collection Time: 08/17/18  8:37 PM  Result Value Ref Range Status   Specimen Description BLOOD RIGHT FOREARM  Final   Special Requests   Final    BOTTLES DRAWN AEROBIC ONLY Blood Culture results may not be optimal due to an inadequate  volume of blood received in culture bottles   Culture   Final    NO GROWTH 3 DAYS Performed at [facility], [place] [address], [place], [place] [address]    Report Status PENDING  Incomplete  Urine Culture     Status: Abnormal   Collection Time: 08/17/18  9:55 PM  Result Value Ref Range Status   Specimen Description URINE, CATHETERIZED  Final   Special Requests NONE  Final   Culture (A)  Final    <10,000 COLONIES/mL INSIGNIFICANT GROWTH Performed at [facility], [place] [address], [place], [place] [address]    Report Status 08/19/2018 FINAL  Final         Radiology Studies: Ct Head Wo Contrast  Result Date: 08/18/2018 CLINICAL DATA:  68 y/o  F; generalized muscle weakness. EXAM: CT HEAD WITHOUT CONTRAST TECHNIQUE: Contiguous axial images were obtained from the base of the skull through the vertex without intravenous contrast. COMPARISON:  None. FINDINGS: Brain: No evidence of acute infarction, hemorrhage, hydrocephalus, extra-axial collection or mass lesion/mass effect. Small chronic lacunar infarct within the left basal ganglia. Non-specific white matter hypodensities are compatible with mild chronic microvascular ischemic changes. Mild volume loss of brain. Vascular: No hyperdense vessel or unexpected calcification. Skull: Normal. Negative for fracture or focal lesion. Sinuses/Orbits: No acute finding. Other: None. IMPRESSION: 1. No acute intracranial abnormality. 2. Mild chronic microvascular ischemic changes and volume loss of the brain. Electronically Signed   By: Kristine Garbe M.D.   On: 08/18/2018 16:45        Scheduled Meds: . carvedilol  3.125 mg Oral BID WC  . gabapentin  100 mg Oral TID  . heparin  5,000 Units Subcutaneous Q8H  . insulin aspart  0-9 Units Subcutaneous TID WC  . insulin aspart  5 Units Subcutaneous TID WC  . insulin glargine  25 Units Subcutaneous QHS   Continuous Infusions:    LOS: 0 days     Vernell Leep, MD, FACP,  [facility]  To contact the attending provider between 7A-7P or the covering provider during after hours 7P-7A, please log into the web site [email or web address] and access using universal [place] password for that web site. If you do not have the password, please call the hospital operator.  08/20/2018, 12:58 PM

## 2018-08-21 DIAGNOSIS — Z9111 Patient's noncompliance with dietary regimen: Secondary | ICD-10-CM | POA: Diagnosis not present

## 2018-08-21 DIAGNOSIS — Z880 Allergy status to penicillin: Secondary | ICD-10-CM | POA: Diagnosis not present

## 2018-08-21 DIAGNOSIS — L299 Pruritus, unspecified: Secondary | ICD-10-CM | POA: Diagnosis present

## 2018-08-21 DIAGNOSIS — F410 Panic disorder [episodic paroxysmal anxiety] without agoraphobia: Secondary | ICD-10-CM | POA: Diagnosis present

## 2018-08-21 DIAGNOSIS — Z888 Allergy status to other drugs, medicaments and biological substances status: Secondary | ICD-10-CM | POA: Diagnosis not present

## 2018-08-21 DIAGNOSIS — E860 Dehydration: Secondary | ICD-10-CM | POA: Diagnosis present

## 2018-08-21 DIAGNOSIS — Z794 Long term (current) use of insulin: Secondary | ICD-10-CM | POA: Diagnosis not present

## 2018-08-21 DIAGNOSIS — K7460 Unspecified cirrhosis of liver: Secondary | ICD-10-CM | POA: Diagnosis present

## 2018-08-21 DIAGNOSIS — F411 Generalized anxiety disorder: Secondary | ICD-10-CM | POA: Diagnosis present

## 2018-08-21 DIAGNOSIS — E871 Hypo-osmolality and hyponatremia: Secondary | ICD-10-CM | POA: Diagnosis present

## 2018-08-21 DIAGNOSIS — R740 Nonspecific elevation of levels of transaminase and lactic acid dehydrogenase [LDH]: Secondary | ICD-10-CM | POA: Diagnosis present

## 2018-08-21 DIAGNOSIS — I959 Hypotension, unspecified: Secondary | ICD-10-CM | POA: Diagnosis present

## 2018-08-21 DIAGNOSIS — R2689 Other abnormalities of gait and mobility: Secondary | ICD-10-CM | POA: Diagnosis not present

## 2018-08-21 DIAGNOSIS — F329 Major depressive disorder, single episode, unspecified: Secondary | ICD-10-CM | POA: Diagnosis present

## 2018-08-21 DIAGNOSIS — I110 Hypertensive heart disease with heart failure: Secondary | ICD-10-CM | POA: Diagnosis present

## 2018-08-21 DIAGNOSIS — I10 Essential (primary) hypertension: Secondary | ICD-10-CM | POA: Diagnosis not present

## 2018-08-21 DIAGNOSIS — E1100 Type 2 diabetes mellitus with hyperosmolarity without nonketotic hyperglycemic-hyperosmolar coma (NKHHC): Secondary | ICD-10-CM | POA: Diagnosis present

## 2018-08-21 DIAGNOSIS — J45909 Unspecified asthma, uncomplicated: Secondary | ICD-10-CM | POA: Diagnosis present

## 2018-08-21 DIAGNOSIS — E872 Acidosis: Secondary | ICD-10-CM | POA: Diagnosis present

## 2018-08-21 DIAGNOSIS — R188 Other ascites: Secondary | ICD-10-CM | POA: Diagnosis present

## 2018-08-21 DIAGNOSIS — N179 Acute kidney failure, unspecified: Secondary | ICD-10-CM | POA: Diagnosis present

## 2018-08-21 DIAGNOSIS — Z885 Allergy status to narcotic agent status: Secondary | ICD-10-CM | POA: Diagnosis not present

## 2018-08-21 DIAGNOSIS — R945 Abnormal results of liver function studies: Secondary | ICD-10-CM | POA: Diagnosis not present

## 2018-08-21 DIAGNOSIS — I5042 Chronic combined systolic (congestive) and diastolic (congestive) heart failure: Secondary | ICD-10-CM | POA: Diagnosis present

## 2018-08-21 DIAGNOSIS — M170 Bilateral primary osteoarthritis of knee: Secondary | ICD-10-CM | POA: Diagnosis present

## 2018-08-21 DIAGNOSIS — E785 Hyperlipidemia, unspecified: Secondary | ICD-10-CM | POA: Diagnosis present

## 2018-08-21 DIAGNOSIS — M6281 Muscle weakness (generalized): Secondary | ICD-10-CM | POA: Diagnosis not present

## 2018-08-21 DIAGNOSIS — G4733 Obstructive sleep apnea (adult) (pediatric): Secondary | ICD-10-CM | POA: Diagnosis present

## 2018-08-21 DIAGNOSIS — D649 Anemia, unspecified: Secondary | ICD-10-CM | POA: Diagnosis present

## 2018-08-21 LAB — HEPATIC FUNCTION PANEL
ALT: 402 U/L — ABNORMAL HIGH (ref 0–44)
AST: 419 U/L — ABNORMAL HIGH (ref 15–41)
Albumin: 2.8 g/dL — ABNORMAL LOW (ref 3.5–5.0)
Alkaline Phosphatase: 337 U/L — ABNORMAL HIGH (ref 38–126)
Bilirubin, Direct: 0.5 mg/dL — ABNORMAL HIGH (ref 0.0–0.2)
Indirect Bilirubin: 0.8 mg/dL (ref 0.3–0.9)
Total Bilirubin: 1.3 mg/dL — ABNORMAL HIGH (ref 0.3–1.2)
Total Protein: 8 g/dL (ref 6.5–8.1)

## 2018-08-21 LAB — CBC
HCT: 30.5 % — ABNORMAL LOW (ref 36.0–46.0)
Hemoglobin: 10 g/dL — ABNORMAL LOW (ref 12.0–15.0)
MCH: 29.2 pg (ref 26.0–34.0)
MCHC: 32.8 g/dL (ref 30.0–36.0)
MCV: 89.2 fL (ref 80.0–100.0)
Platelets: 204 10*3/uL (ref 150–400)
RBC: 3.42 MIL/uL — ABNORMAL LOW (ref 3.87–5.11)
RDW: 13.5 % (ref 11.5–15.5)
WBC: 7.7 10*3/uL (ref 4.0–10.5)
nRBC: 0 % (ref 0.0–0.2)

## 2018-08-21 LAB — GLUCOSE, CAPILLARY
Glucose-Capillary: 111 mg/dL — ABNORMAL HIGH (ref 70–99)
Glucose-Capillary: 135 mg/dL — ABNORMAL HIGH (ref 70–99)
Glucose-Capillary: 199 mg/dL — ABNORMAL HIGH (ref 70–99)
Glucose-Capillary: 176 mg/dL — ABNORMAL HIGH (ref 70–99)

## 2018-08-21 MED ORDER — HYDROXYZINE HCL 10 MG PO TABS
10.0000 mg | ORAL_TABLET | ORAL | Status: DC | PRN
  Administered 2018-08-21 – 2018-08-22 (×4): 10 mg via ORAL
  Filled 2018-08-21 (×3): qty 1

## 2018-08-21 NOTE — Progress Notes (Signed)
PROGRESS NOTE   Gloria [name]  HBZ:[id number]    DOB: May 01, 1951    DOA: 08/17/2018  PCP: Biagio Borg, MD   I have briefly reviewed patients previous medical records in [facility].  Brief Narrative:  68 year old female, lives alone and independent, PMH of HTN, HLD, DM, asthma, depression, chronic systolic CHF, LVEF 10%, migraine headaches, OSA not on CPAP, cirrhosis, recently hospitalized 1/17-1/19 due to [facility] in DM1, presented with 1 week history of generalized weakness.  Transiently hypotensive in ED 84/52, normalized after IV fluid bolus, also noted in the ED with blood sugar 604 but not in DKA, creatinine 1.85, BUN 37 and mild transaminitis.  She was diagnosed with HONK in type II DM/IDDM, poorly controlled, briefly placed on IV insulin drip and then transitioned to Lantus and NovoLog mealtime and SSI.  Adjusting insulins.  Evaluating transaminitis.  DC to SNF pending bed availability and insurance approval.   Assessment & Plan:   Principal Problem:   Hyperosmolar non-ketotic state in patient with type 2 diabetes mellitus ([name]) Active Problems:   Hyperlipidemia   Anxiety state   Essential hypertension   Uncontrolled type 2 diabetes mellitus with diabetic neuropathy, without long-term current use of insulin (HCC)   Cirrhosis of liver with ascites (HCC)   Chronic combined systolic (congestive) and diastolic (congestive) heart failure (HCC)   Abnormal LFTs   Hypotension   AKI (acute kidney injury) ([place])   Lactic acidosis   Type 2 diabetes mellitus with hyperosmolar nonketotic hyperglycemia ([name])   1. Hyperosmolar nonketotic state in patient with poorly controlled type II DM/IDDM: A1c 10 on 07/18/2018.  Likely precipitated by dietary noncompliance-patient drinks lots of regular sodas.  Briefly placed on IV insulin drip, as CBGs improved, transition to Lantus 25 units at bedtime, NovoLog SSI and mealtime.  DM coordinator and dietitian input appreciated.  Patient counseled  extensively regarding compliance with all aspects of her DM care and she verbalized understanding.  Increase NovoLog mealtime to 5 units.  Much improved glycemic control.  FBG however 83 on 2/20 and at risk for hypoglycemia.  Hence reduced Lantus to 22 units at bedtime, continue NovoLog sensitive SSI without bedtime scale and NovoLog mealtime 5 units 3 times daily.  CBGs reasonably controlled in the hospital, continue current regimen. 2. Acute kidney injury: Secondary to dehydration from hyperglycemia.  Resolved after IV fluid hydration. 3. Hypotension: Present on admission.  Due to dehydration.  Resolved after IV fluids. 4. Lactic acidosis: Secondary to dehydration and hypotension.  Resolved. 5. Dehydration with hyponatremia: Secondary to hyperglycemia.  Resolved. 6. Transaminitis: No GI symptoms reported.  Could be related to shock liver from hypotension on admission.  Has had paracentesis x2 back in November thought to be cardiogenic ascites.  Abdominal ultrasound 2/17 without acute findings and shows normal liver parenchymal echotexture and portal vein flow.  Acute hepatitis panel negative.  LFTs had slowly improved but subsequently plateaued.  [facility] consultation and follow-up appreciated, concern for autoimmune hepatitis.  Repeat ANA, ASMA IgG pending.  Discussed with GI and they recommend outpatient LFT panel follow-up weekly for 2 to 3 weeks, if does not improve or worsens then follow-up with Dr. Otho Darner and would likely need liver biopsy and to hold Entresto and Lipitor in the interim.  Follow LFTs in a.m. as we await SNF. 7. Generalized weakness/deconditioning: PT and OT recommend SNF.  Patient reluctant but agreeable.  CT head requested by son and no acute findings. 8. Chronic combined systolic and diastolic CHF: TTE  on 07/18/2018: LVEF 45-50% compared to 05/2018 where LVEF 25%.  Seen by cardiology in office on 07/18/2018 and torsemide reduced.  Repeat echo planned on 2/29.  Currently  compensated.  Entresto, Aldactone and torsemide currently on hold due to recent hypotension and acute kidney injury.  Continue carvedilol.  Consider discussing with cardiology prior to resuming some of her medications.  Remains clinically euvolemic off of diuretics at this time. 9. Hyperlipidemia: Statins held due to elevated LFTs. 10. Anxiety state: Stable.  Hydroxyzine started for pruritus should help. 11. Essential hypertension: Hypotensive on admission, resolved.  Now controlled on carvedilol 3.125 mg twice daily. 12. Pruritus: Unclear etiology,?  Related to hepatic picture.  PRN Vistaril.  Improved but not resolved. 13. Normocytic anemia: Unclear etiology but stable.  Follow CBC as outpatient.   DVT prophylaxis: Subcutaneous heparin Code Status: Full Family Communication: None at bedside Disposition: Medically stable at this time to discharge to SNF pending bed/insurance approval.  [facility] has cleared for discharge.  Discussed with clinical social work.   Consultants:  [facility]  Procedures:  None  Antimicrobials:  None   Subjective: Intermittent generalized pruritus without rash.  No GI complaints or any other complaints reported.  ROS: As above, otherwise negative.  Objective:  Vitals:   08/20/18 0904 08/20/18 1619 08/20/18 1941 08/21/18 0930  BP: 127/60 (!) 128/57 (!) 116/57 136/83  Pulse: 67 62 67 73  Resp: 18 18 20 18   Temp: 98 F (36.7 C) 98.6 F (37 C) 98 F (36.7 C) 98.4 F (36.9 C)  TempSrc: Oral Oral Oral Oral  SpO2: 96% 98% 98% 97%  Weight:   76.1 kg   Height:        Examination: No significant change in clinical exam compared to yesterday.  No icterus.  General exam: Pleasant middle-aged female, moderately built and nourished, lying comfortably propped up in bed without distress.  Oral mucosa moist.  Stable Respiratory system: Clear to auscultation.  No increased work of breathing.   Cardiovascular system: S1 & S2 heard, RRR. No JVD, murmurs,  rubs, gallops or clicks. No pedal edema.  Stable without change Gastrointestinal system: Abdomen is nondistended, soft and nontender. No organomegaly or masses felt. Normal bowel sounds heard.  Stable. Central nervous system: Alert and oriented. No focal neurological deficits.  Stable. Extremities: Symmetric 5 x 5 power. Skin: No rashes, lesions or ulcers Psychiatry: Judgement and insight appear somewhat impaired. Mood & affect flat.     Data Reviewed: I have personally reviewed following labs and imaging studies  CBC: Recent Labs  Lab 08/17/18 1953 08/17/18 2118 08/18/18 0249 08/21/18 0523  WBC 6.7 6.4 6.5 7.7  NEUTROABS 4.7 4.6  --   --   HGB 10.2* 10.0* 9.6* 10.0*  HCT 31.4* 29.8* 29.3* 30.5*  MCV 89.5 89.0 88.5 89.2  PLT 222 218 225 941   Basic Metabolic Panel: Recent Labs  Lab 08/17/18 1953 08/17/18 2118 08/18/18 0249 08/19/18 0431  NA 128* 131* 136 139  K 3.9 4.0 4.0 3.8  CL 92* 99 104 107  CO2 21* 21* 22 21*  GLUCOSE 604* 447* 227* 129*  BUN 37* 34* 32* 18  CREATININE 1.85* 1.58* 1.50* 0.99  CALCIUM 9.6 9.3 9.2 9.6   Liver Function Tests: Recent Labs  Lab 08/17/18 1953 08/18/18 0249 08/19/18 0431 08/20/18 0605 08/21/18 0523  AST 362* 307* 335* 373* 419*  ALT 495* 464* 395* 403* 402*  ALKPHOS 377* 345* 331* 334* 337*  BILITOT 1.0 1.0 1.0 1.0 1.3*  PROT 8.4* 7.4 7.2 7.9 8.0  ALBUMIN 3.3* 2.8* 2.8* 2.9* 2.8*   Coagulation Profile: Recent Labs  Lab 08/18/18 0340  INR 1.02   CBG: Recent Labs  Lab 08/20/18 1134 08/20/18 1616 08/20/18 2000 08/21/18 0720 08/21/18 1119  GLUCAP 115* 154* 201* 111* 135*    Recent Results (from the past 240 hour(s))  Blood Culture (routine x 2)     Status: None (Preliminary result)   Collection Time: 08/17/18  8:07 PM  Result Value Ref Range Status   Specimen Description BLOOD RIGHT ARM  Final   Special Requests   Final    BOTTLES DRAWN AEROBIC AND ANAEROBIC Blood Culture results may not be optimal due to an  inadequate volume of blood received in culture bottles Performed at [facility], [place] [address] [place], [place] [address]    Culture NO GROWTH 4 DAYS  Final   Report Status PENDING  Incomplete  Blood Culture (routine x 2)     Status: None (Preliminary result)   Collection Time: 08/17/18  8:37 PM  Result Value Ref Range Status   Specimen Description BLOOD RIGHT FOREARM  Final   Special Requests   Final    BOTTLES DRAWN AEROBIC ONLY Blood Culture results may not be optimal due to an inadequate volume of blood received in culture bottles Performed at [place] [address] [place], [place] [address]    Culture NO GROWTH 4 DAYS  Final   Report Status PENDING  Incomplete  Urine Culture     Status: Abnormal   Collection Time: 08/17/18  9:55 PM  Result Value Ref Range Status   Specimen Description URINE, CATHETERIZED  Final   Special Requests NONE  Final   Culture (A)  Final    <10,000 COLONIES/mL INSIGNIFICANT GROWTH Performed at [facility], [place] [address] [place], [place] [address]    Report Status 08/19/2018 FINAL  Final         Radiology Studies: No results found.      Scheduled Meds: . carvedilol  3.125 mg Oral BID WC  . gabapentin  100 mg Oral TID  . heparin  5,000 Units Subcutaneous Q8H  . insulin aspart  0-9 Units Subcutaneous TID WC  . insulin aspart  5 Units Subcutaneous TID WC  . insulin glargine  22 Units Subcutaneous QHS   Continuous Infusions:    LOS: 0 days     Vernell Leep, MD, FACP, [facility]. [facility]  To contact the attending provider between 7A-7P or the covering provider during after hours 7P-7A, please log into the web site [email or web address] and access using universal [place] password for that web site. If you do not have the password, please call the hospital operator.  08/21/2018, 3:12 PM

## 2018-08-21 NOTE — Progress Notes (Addendum)
Daily Rounding Note  08/21/2018, 11:04 AM  LOS: 0 days   SUBJECTIVE:   Chief complaint: Elevated LFTs.     Patient feeling well.  Hospitalist gearing up patient for discharge to a SNF for rehab.  Wants to make sure that GI is okay with discharge.  This may happen today.  Patient feels well.  No abdominal pain, no nausea, no anorexia.  Eating well.  Moving her bowels.  No confusion or somnolence.    OBJECTIVE:         Vital signs in last 24 hours:    Temp:  [98 F (36.7 C)-98.6 F (37 C)] 98.4 F (36.9 C) (02/21 0930) Pulse Rate:  [62-73] 73 (02/21 0930) Resp:  [18-20] 18 (02/21 0930) BP: (116-136)/(57-83) 136/83 (02/21 0930) SpO2:  [97 %-98 %] 97 % (02/21 0930) Weight:  [76.1 kg] 76.1 kg (02/20 1941) Last BM Date: 08/19/18 Filed Weights   08/19/18 0531 08/19/18 2120 08/20/18 1941  Weight: 75.1 kg 75.3 kg 76.1 kg   General: Looks well.  Slightly icteric. Heart: RRR. Chest: Clear bilaterally. Abdomen: Soft.  Not tender or distended.  Active bowel sounds. Extremities: No CCE. Neuro/Psych: Alert.  Oriented x3.  No tremor or asterixis.  Fluid speech.  Appropriate.  Intake/Output from previous day: 02/20 0701 - 02/21 0700 In: 660 [P.O.:660] Out: 0   Intake/Output this shift: Total I/O In: 300 [P.O.:300] Out: 300 [Urine:300]  Lab Results: Recent Labs    08/21/18 0523  WBC 7.7  HGB 10.0*  HCT 30.5*  PLT 204   BMET Recent Labs    08/19/18 0431  NA 139  K 3.8  CL 107  CO2 21*  GLUCOSE 129*  BUN 18  CREATININE 0.99  CALCIUM 9.6   LFT Recent Labs    08/19/18 0431 08/20/18 0605 08/21/18 0523  PROT 7.2 7.9 8.0  ALBUMIN 2.8* 2.9* 2.8*  AST 335* 373* 419*  ALT 395* 403* 402*  ALKPHOS 331* 334* 337*  BILITOT 1.0 1.0 1.3*  BILIDIR  --  0.4* 0.5*  IBILI  --  0.6 0.8   PT/INR No results for input(s): LABPROT, INR in the last 72 hours. Hepatitis Panel No results for input(s): HEPBSAG, HCVAB,  HEPAIGM, HEPBIGM in the last 72 hours.  Studies/Results: No results found.  Scheduled Meds: . carvedilol  3.125 mg Oral BID WC  . gabapentin  100 mg Oral TID  . heparin  5,000 Units Subcutaneous Q8H  . insulin aspart  0-9 Units Subcutaneous TID WC  . insulin aspart  5 Units Subcutaneous TID WC  . insulin glargine  22 Units Subcutaneous QHS   Continuous Infusions: PRN Meds:.ALPRAZolam, dextrose, hydrALAZINE, hydrOXYzine, ondansetron **OR** ondansetron (ZOFRAN) IV, senna-docusate  ASSESMENT:    *   New acute LFT elevation. Cholestatic pattern with elevated Alk Phos.   Interestingly her ultrasound shows normal liver, biliary tree and no evidence for cholecystitis.  On imaging studies in early 05/2018 she had hepatocellular disease and undewent 2 larger volume paracentesis to address ascites.  LFTs unremarkable at that time.  GI physicians felt the hepatocellular appearance was due to cardiac causes.  Current ultrasound shows no hepatocellular disease and no ascites, yet her LFTs are acutely elevated. Has no sxs to suggest acute or chronic cholecystitis.   APAP level normal. Multiple labs in 05/2018 with +ANA but no other labs to suggest AIH.   No coagulopathy, AT/INR/PTT normal.   Hepatitis serologies in 05/2018 and 08/18/18 all negative.  Home diuretics include Spironolactone 25 mg/day, Demadex 20 mg/day One of her meds, Entresto, can cause hepatitis but she's been taking this for at least 4 months. also on long term Lipitor.  So ? DILI.    *  Compensated heart failure.       PLAN   *  Ok to discharge to SNF.    *   Obtain hepatic fx profile weekly for 2 to 3 weeks. If trending up increase frequency of LFT assays.   If trending down, can prolong interval for LFT testing and follow until resolved.  If LFTs do not improve, should see Dr Perry or APP at GI office and would likely need liver biopsy.   Will make fup at GI in ~ 3 weeks with Dr. John Perry or APP.   *  Hold Entresto  and Lipitor.     Sarah Gribbin  08/21/2018, 11:04 AM Phone [phone]     Attending Physician Note   I have taken an interval history, reviewed the chart and examined the patient. I agree with the Advanced Practitioner's note, impression and recommendations. Hold Entresto and Lipitor. Trend LFTs, PT/INR weekly. Repeat ANA, ASMA, IgG pending. GI follow up as above.    , MD FACG [phone] 

## 2018-08-21 NOTE — Clinical Social Work Placement (Addendum)
   CLINICAL SOCIAL WORK PLACEMENT  NOTE *DISCHARGE TO [facility] PENDING INSURANCE AUTH*  Date:  08/21/2018  Patient Details  Name: KEILAH LEMIRE MRN: [id number] Date of Birth: Apr 11, 1951  Clinical Social Work is seeking post-discharge placement for this patient at the [place] level of care (*CSW will initial, date and re-position this form in  chart as items are completed):  Yes   Patient/family provided with [place] Work Department's list of facilities offering this level of care within the geographic area requested by the patient (or if unable, by the patient's family).  Yes   Patient/family informed of their freedom to choose among providers that offer the needed level of care, that participate in Medicare, Medicaid or managed care program needed by the patient, have an available bed and are willing to accept the patient.  Yes   Patient/family informed of [facility]'s ownership interest in [facility] and [facility], as well as of the fact that they are under no obligation to receive care at these facilities.  PASRR submitted to [employer] on 08/21/18(PASRR Manual Review - MUST W[place]#[id number])     PASRR number received on       Existing PASRR number confirmed on       FL2 transmitted to all facilities in geographic area requested by pt/family on 08/20/18     FL2 transmitted to all facilities within larger geographic area on       Patient informed that his/her managed care company has contracts with or will negotiate with certain facilities, including the following:        Yes   Patient/family informed of bed offers received.  Patient [facility] bed at [facility]     Physician recommends and patient chooses bed at      Patient to be transferred to [facility] on  .  Patient to be transferred to facility by       Patient family notified on   of transfer.  Name of family member notified:        PHYSICIAN       Additional Comment:      _______________________________________________ Sable Feil, LCSW 08/21/2018, 4:31 PM

## 2018-08-22 DIAGNOSIS — L299 Pruritus, unspecified: Secondary | ICD-10-CM

## 2018-08-22 LAB — CULTURE, BLOOD (ROUTINE X 2)
Culture: NO GROWTH
Culture: NO GROWTH

## 2018-08-22 LAB — HEPATIC FUNCTION PANEL
ALT: 392 U/L — ABNORMAL HIGH (ref 0–44)
AST: 409 U/L — ABNORMAL HIGH (ref 15–41)
Albumin: 2.9 g/dL — ABNORMAL LOW (ref 3.5–5.0)
Alkaline Phosphatase: 316 U/L — ABNORMAL HIGH (ref 38–126)
Bilirubin, Direct: 0.5 mg/dL — ABNORMAL HIGH (ref 0.0–0.2)
Indirect Bilirubin: 0.8 mg/dL (ref 0.3–0.9)
Total Bilirubin: 1.3 mg/dL — ABNORMAL HIGH (ref 0.3–1.2)
Total Protein: 7.9 g/dL (ref 6.5–8.1)

## 2018-08-22 LAB — GLUCOSE, CAPILLARY
Glucose-Capillary: 108 mg/dL — ABNORMAL HIGH (ref 70–99)
Glucose-Capillary: 152 mg/dL — ABNORMAL HIGH (ref 70–99)
Glucose-Capillary: 161 mg/dL — ABNORMAL HIGH (ref 70–99)
Glucose-Capillary: 168 mg/dL — ABNORMAL HIGH (ref 70–99)

## 2018-08-22 MED ORDER — DIPHENHYDRAMINE HCL 25 MG PO CAPS
25.0000 mg | ORAL_CAPSULE | Freq: Four times a day (QID) | ORAL | Status: DC | PRN
  Administered 2018-08-22 – 2018-08-24 (×8): 25 mg via ORAL
  Filled 2018-08-22 (×8): qty 1

## 2018-08-22 MED ORDER — CAMPHOR-MENTHOL 0.5-0.5 % EX LOTN
TOPICAL_LOTION | CUTANEOUS | Status: DC | PRN
  Administered 2018-08-22: 16:00:00 via TOPICAL
  Filled 2018-08-22 (×2): qty 222

## 2018-08-22 NOTE — Progress Notes (Signed)
Patient is complaining of itching and states that current PRN is not helping. Hongalgi, MD paged and made aware. Awaiting orders.

## 2018-08-22 NOTE — Progress Notes (Signed)
PROGRESS NOTE   Gloria [name]  YKZ:[id number]    DOB: 1951/03/05    DOA: 08/17/2018  PCP: Biagio Borg, MD   I have briefly reviewed patients previous medical records in [facility].  Brief Narrative:  68 year old female, lives alone and independent, PMH of HTN, HLD, DM, asthma, depression, chronic systolic CHF, LVEF 93%, migraine headaches, OSA not on CPAP, cirrhosis, recently hospitalized 1/17-1/19 due to [facility] in DM1, presented with 1 week history of generalized weakness.  Transiently hypotensive in ED 84/52, normalized after IV fluid bolus, also noted in the ED with blood sugar 604 but not in DKA, creatinine 1.85, BUN 37 and mild transaminitis.  She was diagnosed with HONK in type II DM/IDDM, poorly controlled, briefly placed on IV insulin drip and then transitioned to Lantus and NovoLog mealtime and SSI.  Adjusting insulins.  Evaluating transaminitis.  DC to SNF pending bed availability and insurance approval and as per discussion with RN today, may not happen until Monday 2/24.   Assessment & Plan:   Principal Problem:   Hyperosmolar non-ketotic state in patient with type 2 diabetes mellitus ([place]) Active Problems:   Hyperlipidemia   Anxiety state   Essential hypertension   Uncontrolled type 2 diabetes mellitus with diabetic neuropathy, without long-term current use of insulin (HCC)   Cirrhosis of liver with ascites (HCC)   Chronic combined systolic (congestive) and diastolic (congestive) heart failure (HCC)   Abnormal LFTs   Hypotension   AKI (acute kidney injury) ([place])   Lactic acidosis   Type 2 diabetes mellitus with hyperosmolar nonketotic hyperglycemia ([place])   1. Hyperosmolar nonketotic state in patient with poorly controlled type II DM/IDDM: A1c 10 on 07/18/2018.  Likely precipitated by dietary noncompliance-patient drinks lots of regular sodas.  Briefly placed on IV insulin drip, as CBGs improved, transition to Lantus 25 units at bedtime, NovoLog SSI and mealtime.  DM  coordinator and dietitian input appreciated.  Patient counseled extensively regarding compliance with all aspects of her DM care and she verbalized understanding.  Increase NovoLog mealtime to 5 units.  Much improved glycemic control.  FBG however 83 on 2/20 and at risk for hypoglycemia.  Hence reduced Lantus to 22 units at bedtime, continue NovoLog sensitive SSI without bedtime scale and NovoLog mealtime 5 units 3 times daily.  CBGs reasonably controlled in the hospital, continue current regimen.  Continue current management. 2. Acute kidney injury: Secondary to dehydration from hyperglycemia.  Resolved after IV fluid hydration. 3. Hypotension: Present on admission.  Due to dehydration.  Resolved after IV fluids. 4. Lactic acidosis: Secondary to dehydration and hypotension.  Resolved. 5. Dehydration with hyponatremia: Secondary to hyperglycemia.  Resolved. 6. Transaminitis: No GI symptoms reported.  Could be related to shock liver from hypotension on admission.  Has had paracentesis x2 back in November thought to be cardiogenic ascites.  Abdominal ultrasound 2/17 without acute findings and shows normal liver parenchymal echotexture and portal vein flow.  Acute hepatitis panel negative.  LFTs had slowly improved but subsequently plateaued.  [facility] consultation and follow-up appreciated, concern for autoimmune hepatitis.  Repeat ANA, ASMA IgG still pending.  Discussed with GI and they recommend outpatient LFT panel follow-up weekly for 2 to 3 weeks, if does not improve or worsens then follow-up with Dr. Otho Darner and would likely need liver biopsy and to hold Entresto and Lipitor in the interim.  LFTs may be slightly better compared to yesterday but no marked change.  No change in plan as noted above.  Recheck LFTs on Monday 2/24 if she is still here. 7. Generalized weakness/deconditioning: PT and OT recommend SNF.  Patient reluctant but agreeable.  CT head requested by son and no acute  findings. 8. Chronic combined systolic and diastolic CHF: TTE on 03/01/5175: LVEF 45-50% compared to 05/2018 where LVEF 25%.  Seen by cardiology in office on 07/18/2018 and torsemide reduced.  Repeat echo planned on 2/29.  Currently compensated.  Entresto, Aldactone and torsemide currently on hold due to recent hypotension and acute kidney injury.  Continue carvedilol.  Consider discussing with cardiology prior to resuming some of her medications.  Remains clinically euvolemic off of diuretics at this time. 9. Hyperlipidemia: Statins held due to elevated LFTs. 10. Anxiety state: Stable.   11. Essential hypertension: Hypotensive on admission, resolved.  Now controlled on carvedilol 3.125 mg twice daily. 12. Pruritus: Unclear etiology,?  Related to hepatic picture.  PRN Vistaril was not helping.  Patient reports history of generalized pruritus for approximately a month.  Trial of PRN Benadryl and topical Sarna lotion. 13. Normocytic anemia: Unclear etiology but stable.  Follow CBC as outpatient.   DVT prophylaxis: Subcutaneous heparin Code Status: Full Family Communication: Discussed in detail with patient's son, updated care and answered questions. Disposition: Medically stable at this time to discharge to SNF pending bed/insurance approval.  [facility] has cleared for discharge.  Discussed with clinical social work.   Consultants:  [facility]  Procedures:  None  Antimicrobials:  None   Subjective: Reports generalized intermittent pruritus for close to a month without skin rash.  Vistaril was not helping.  Denies any other complaints.  ROS: As above, otherwise negative.  Objective:  Vitals:   08/21/18 1816 08/21/18 2001 08/22/18 0409 08/22/18 0917  BP: (!) 119/54 134/66 (!) 108/56 (!) 128/57  Pulse: 74 66 72 72  Resp: 18 19 19 18   Temp: 98.4 F (36.9 C) 98.4 F (36.9 C) 98.1 F (36.7 C) 98.2 F (36.8 C)  TempSrc: Oral Oral Oral Oral  SpO2: 97% 96% 97% 97%  Weight:  74.3 kg     Height:        Examination:   General exam: Pleasant middle-aged female, moderately built and nourished, lying comfortably propped up in bed without distress.  Oral mucosa moist.  Anicteric. Respiratory system: Clear to auscultation.  No increased work of breathing.   Cardiovascular system: S1 & S2 heard, RRR. No JVD, murmurs, rubs, gallops or clicks. No pedal edema.  Stable without change Gastrointestinal system: Abdomen is nondistended, soft and nontender. No organomegaly or masses felt. Normal bowel sounds heard.  Stable. Central nervous system: Alert and oriented. No focal neurological deficits.  Stable. Extremities: Symmetric 5 x 5 power. Skin: Generalized skin appears slightly dry but no rashes or acute findings. Psychiatry: Judgement and insight appear somewhat impaired. Mood & affect flat.     Data Reviewed: I have personally reviewed following labs and imaging studies  CBC: Recent Labs  Lab 08/17/18 1953 08/17/18 2118 08/18/18 0249 08/21/18 0523  WBC 6.7 6.4 6.5 7.7  NEUTROABS 4.7 4.6  --   --   HGB 10.2* 10.0* 9.6* 10.0*  HCT 31.4* 29.8* 29.3* 30.5*  MCV 89.5 89.0 88.5 89.2  PLT 222 218 225 160   Basic Metabolic Panel: Recent Labs  Lab 08/17/18 1953 08/17/18 2118 08/18/18 0249 08/19/18 0431  NA 128* 131* 136 139  K 3.9 4.0 4.0 3.8  CL 92* 99 104 107  CO2 21* 21* 22 21*  GLUCOSE 604* 447* 227*  129*  BUN 37* 34* 32* 18  CREATININE 1.85* 1.58* 1.50* 0.99  CALCIUM 9.6 9.3 9.2 9.6   Liver Function Tests: Recent Labs  Lab 08/18/18 0249 08/19/18 0431 08/20/18 0605 08/21/18 0523 08/22/18 0403  AST 307* 335* 373* 419* 409*  ALT 464* 395* 403* 402* 392*  ALKPHOS 345* 331* 334* 337* 316*  BILITOT 1.0 1.0 1.0 1.3* 1.3*  PROT 7.4 7.2 7.9 8.0 7.9  ALBUMIN 2.8* 2.8* 2.9* 2.8* 2.9*   Coagulation Profile: Recent Labs  Lab 08/18/18 0340  INR 1.02   CBG: Recent Labs  Lab 08/21/18 1119 08/21/18 1613 08/21/18 2003 08/22/18 0732 08/22/18 1131  GLUCAP  135* 199* 176* 108* 152*    Recent Results (from the past 240 hour(s))  Blood Culture (routine x 2)     Status: None (Preliminary result)   Collection Time: 08/17/18  8:07 PM  Result Value Ref Range Status   Specimen Description BLOOD RIGHT ARM  Final   Special Requests   Final    BOTTLES DRAWN AEROBIC AND ANAEROBIC Blood Culture results may not be optimal due to an inadequate volume of blood received in culture bottles Performed at [facility] [address] [place], [place] [address]    Culture NO GROWTH 4 DAYS  Final   Report Status PENDING  Incomplete  Blood Culture (routine x 2)     Status: None (Preliminary result)   Collection Time: 08/17/18  8:37 PM  Result Value Ref Range Status   Specimen Description BLOOD RIGHT FOREARM  Final   Special Requests   Final    BOTTLES DRAWN AEROBIC ONLY Blood Culture results may not be optimal due to an inadequate volume of blood received in culture bottles Performed at [facility] [address], [place], [place] [address]    Culture NO GROWTH 4 DAYS  Final   Report Status PENDING  Incomplete  Urine Culture     Status: Abnormal   Collection Time: 08/17/18  9:55 PM  Result Value Ref Range Status   Specimen Description URINE, CATHETERIZED  Final   Special Requests NONE  Final   Culture (A)  Final    <10,000 COLONIES/mL INSIGNIFICANT GROWTH Performed at [facility], [place] [address] [place], [place] [address]    Report Status 08/19/2018 FINAL  Final         Radiology Studies: No results found.      Scheduled Meds: . carvedilol  3.125 mg Oral BID WC  . gabapentin  100 mg Oral TID  . heparin  5,000 Units Subcutaneous Q8H  . insulin aspart  0-9 Units Subcutaneous TID WC  . insulin aspart  5 Units Subcutaneous TID WC  . insulin glargine  22 Units Subcutaneous QHS   Continuous Infusions:    LOS: 1 day     Vernell Leep, MD, FACP, [facility]. [facility]  To contact the attending provider between  7A-7P or the covering provider during after hours 7P-7A, please log into the web site [email or web address] and access using universal [place] password for that web site. If you do not have the password, please call the hospital operator.  08/22/2018, 1:25 PM

## 2018-08-23 LAB — ANTI-SMOOTH MUSCLE ANTIBODY, IGG: F-Actin IgG: 18 Units (ref 0–19)

## 2018-08-23 LAB — GLUCOSE, CAPILLARY
Glucose-Capillary: 94 mg/dL (ref 70–99)
Glucose-Capillary: 165 mg/dL — ABNORMAL HIGH (ref 70–99)
Glucose-Capillary: 94 mg/dL (ref 70–99)
Glucose-Capillary: 172 mg/dL — ABNORMAL HIGH (ref 70–99)

## 2018-08-23 NOTE — Progress Notes (Signed)
PROGRESS NOTE   KIMBERLY NIELAND  [id number]:[id number]    DOB: 07/31/1950    DOA: 08/17/2018  PCP: Biagio Borg, MD   I have briefly reviewed patients previous medical records in [facility]  Brief Narrative:  68 year old female, lives alone and independent, PMH of HTN, HLD, DM, asthma, depression, chronic systolic CHF, LVEF 27%, migraine headaches, OSA not on CPAP, cirrhosis, recently hospitalized 1/17-1/19 due to [facility] in DM1, presented with 1 week history of generalized weakness.  Transiently hypotensive in ED 84/52, normalized after IV fluid bolus, also noted in the ED with blood sugar 604 but not in DKA, creatinine 1.85, BUN 37 and mild transaminitis.  She was diagnosed with HONK in type II DM/IDDM, poorly controlled, briefly placed on IV insulin drip and then transitioned to Lantus and NovoLog mealtime and SSI.  Adjusting insulins.  Evaluating transaminitis.  DC to SNF pending bed availability and insurance approval and as per discussion with RN today, may not happen until Monday 2/24.   Assessment & Plan:   Principal Problem:   Hyperosmolar non-ketotic state in patient with type 2 diabetes mellitus ([place]) Active Problems:   Hyperlipidemia   Anxiety state   Essential hypertension   Uncontrolled type 2 diabetes mellitus with diabetic neuropathy, without long-term current use of insulin (HCC)   Cirrhosis of liver with ascites (HCC)   Chronic combined systolic (congestive) and diastolic (congestive) heart failure (HCC)   Abnormal LFTs   Hypotension   AKI (acute kidney injury) ([place])   Lactic acidosis   Type 2 diabetes mellitus with hyperosmolar nonketotic hyperglycemia ([place])   1. Hyperosmolar nonketotic state in patient with poorly controlled type II DM/IDDM: A1c 10 on 07/18/2018.  Likely precipitated by dietary noncompliance-patient drinks lots of regular sodas.  Briefly placed on IV insulin drip, as CBGs improved, transition to Lantus 25 units at bedtime, NovoLog SSI and mealtime.  DM  coordinator and dietitian input appreciated.  Patient counseled extensively regarding compliance with all aspects of her DM care and she verbalized understanding.  Increase NovoLog mealtime to 5 units.  Much improved glycemic control.  FBG however 83 on 2/20 and at risk for hypoglycemia.  Hence reduced Lantus to 22 units at bedtime, continue NovoLog sensitive SSI without bedtime scale and NovoLog mealtime 5 units 3 times daily.  CBGs well controlled on current insulin regimen, continue. 2. Acute kidney injury: Secondary to dehydration from hyperglycemia.  Resolved after IV fluid hydration. 3. Hypotension: Present on admission.  Due to dehydration.  Resolved after IV fluids. 4. Lactic acidosis: Secondary to dehydration and hypotension.  Resolved. 5. Dehydration with hyponatremia: Secondary to hyperglycemia.  Resolved. 6. Transaminitis: No GI symptoms reported.  Could be related to shock liver from hypotension on admission.  Has had paracentesis x2 back in November thought to be cardiogenic ascites.  Abdominal ultrasound 2/17 without acute findings and shows normal liver parenchymal echotexture and portal vein flow.  Acute hepatitis panel negative.  LFTs had slowly improved but subsequently plateaued.  [facility] consultation and follow-up appreciated, concern for autoimmune hepatitis.  Repeat ANA, ASMA IgG still pending.  Discussed with GI and they recommend outpatient LFT panel follow-up weekly for 2 to 3 weeks, if does not improve or worsens then follow-up with Dr. Otho Darner and would likely need liver biopsy and to hold Entresto and Lipitor in the interim.  LFTs may be slightly better compared to yesterday but no marked change.  No change in plan as noted above.  Recheck LFTs on Monday 2/24.  7. Generalized weakness/deconditioning: PT and OT recommend SNF.  Patient reluctant but agreeable.  CT head requested by son and no acute findings. 8. Chronic combined systolic and diastolic CHF: TTE on 9/37/9024: LVEF  45-50% compared to 05/2018 where LVEF 25%.  Seen by cardiology in office on 07/18/2018 and torsemide reduced.  Repeat echo planned on 2/29.  Currently compensated.  Entresto, Aldactone and torsemide currently on hold due to recent hypotension and acute kidney injury.  Continue carvedilol.  Patient continues to be euvolemic despite off of diuretics since admission 2/17, may need to continue to hold these at discharge and monitor closely for need to resume diuretics.   9. Hyperlipidemia: Statins held due to elevated LFTs. 10. Anxiety state: Stable.   11. Essential hypertension: Hypotensive on admission, resolved.  Now controlled on carvedilol 3.125 mg twice daily. 12. Pruritus: Unclear etiology,?  Related to hepatic picture.  PRN Vistaril was not helping.  Patient reports history of generalized pruritus for approximately a month.  Trial of PRN Benadryl and topical Sarna lotion.  Benadryl seems to be helping better. 13. Normocytic anemia: Unclear etiology but stable.  Follow CBC as outpatient.   DVT prophylaxis: Subcutaneous heparin Code Status: Full Family Communication: None at bedside today. Disposition: Medically stable at this time to discharge to SNF pending bed/insurance approval, hopefully 2/24.   Consultants:  [facility]  Procedures:  None  Antimicrobials:  None   Subjective: Generalized itching is better with Benadryl.  No new complaints.  Interviewed and examined with RN in room.  ROS: As above, otherwise negative.  Objective:  Vitals:   08/22/18 1622 08/22/18 2115 08/23/18 0505 08/23/18 0854  BP: (!) 137/56 (!) 135/55 131/64 127/73  Pulse: 71 70 69 77  Resp: 18 18 18 18   Temp: 98.1 F (36.7 C) 98.6 F (37 C) 98.4 F (36.9 C) 98.6 F (37 C)  TempSrc: Oral Oral Oral Oral  SpO2: 100% 96% 97% 98%  Weight:  74.5 kg    Height:        Examination: No change in clinical exam compared to yesterday.  General exam: Pleasant middle-aged female, moderately built and  nourished, lying comfortably propped up in bed without distress.  Oral mucosa moist.  Anicteric. Respiratory system: Clear to auscultation.  No increased work of breathing.   Cardiovascular system: S1 & S2 heard, RRR. No JVD, murmurs, rubs, gallops or clicks. No pedal edema.  Stable without change Gastrointestinal system: Abdomen is nondistended, soft and nontender. No organomegaly or masses felt. Normal bowel sounds heard.  Stable. Central nervous system: Alert and oriented. No focal neurological deficits.  Stable. Extremities: Symmetric 5 x 5 power. Skin: Generalized skin appears slightly dry but no rashes or acute findings. Psychiatry: Judgement and insight appear somewhat impaired. Mood & affect flat.     Data Reviewed: I have personally reviewed following labs and imaging studies  CBC: Recent Labs  Lab 08/17/18 1953 08/17/18 2118 08/18/18 0249 08/21/18 0523  WBC 6.7 6.4 6.5 7.7  NEUTROABS 4.7 4.6  --   --   HGB 10.2* 10.0* 9.6* 10.0*  HCT 31.4* 29.8* 29.3* 30.5*  MCV 89.5 89.0 88.5 89.2  PLT 222 218 225 097   Basic Metabolic Panel: Recent Labs  Lab 08/17/18 1953 08/17/18 2118 08/18/18 0249 08/19/18 0431  NA 128* 131* 136 139  K 3.9 4.0 4.0 3.8  CL 92* 99 104 107  CO2 21* 21* 22 21*  GLUCOSE 604* 447* 227* 129*  BUN 37* 34* 32* 18  CREATININE  1.85* 1.58* 1.50* 0.99  CALCIUM 9.6 9.3 9.2 9.6   Liver Function Tests: Recent Labs  Lab 08/18/18 0249 08/19/18 0431 08/20/18 0605 08/21/18 0523 08/22/18 0403  AST 307* 335* 373* 419* 409*  ALT 464* 395* 403* 402* 392*  ALKPHOS 345* 331* 334* 337* 316*  BILITOT 1.0 1.0 1.0 1.3* 1.3*  PROT 7.4 7.2 7.9 8.0 7.9  ALBUMIN 2.8* 2.8* 2.9* 2.8* 2.9*   Coagulation Profile: Recent Labs  Lab 08/18/18 0340  INR 1.02   CBG: Recent Labs  Lab 08/22/18 1131 08/22/18 1621 08/22/18 2115 08/23/18 0728 08/23/18 1137  GLUCAP 152* 161* 168* 94 165*    Recent Results (from the past 240 hour(s))  Blood Culture (routine x  2)     Status: None   Collection Time: 08/17/18  8:07 PM  Result Value Ref Range Status   Specimen Description BLOOD RIGHT ARM  Final   Special Requests   Final    BOTTLES DRAWN AEROBIC AND ANAEROBIC Blood Culture results may not be optimal due to an inadequate volume of blood received in culture bottles Performed at [facility] [address] [place], [place] [address]    Culture NO GROWTH 5 DAYS  Final   Report Status 08/22/2018 FINAL  Final  Blood Culture (routine x 2)     Status: None   Collection Time: 08/17/18  8:37 PM  Result Value Ref Range Status   Specimen Description BLOOD RIGHT FOREARM  Final   Special Requests   Final    BOTTLES DRAWN AEROBIC ONLY Blood Culture results may not be optimal due to an inadequate volume of blood received in culture bottles Performed at [facility], [place] [address], [place], [place] [address]    Culture NO GROWTH 5 DAYS  Final   Report Status 08/22/2018 FINAL  Final  Urine Culture     Status: Abnormal   Collection Time: 08/17/18  9:55 PM  Result Value Ref Range Status   Specimen Description URINE, CATHETERIZED  Final   Special Requests NONE  Final   Culture (A)  Final    <10,000 COLONIES/mL INSIGNIFICANT GROWTH Performed at [facility], [place] [address], [place], [place] [address]    Report Status 08/19/2018 FINAL  Final         Radiology Studies: No results found.      Scheduled Meds: . carvedilol  3.125 mg Oral BID WC  . gabapentin  100 mg Oral TID  . heparin  5,000 Units Subcutaneous Q8H  . insulin aspart  0-9 Units Subcutaneous TID WC  . insulin aspart  5 Units Subcutaneous TID WC  . insulin glargine  22 Units Subcutaneous QHS   Continuous Infusions:    LOS: 2 days     Vernell Leep, MD, FACP, [facility]. [facility]  To contact the attending provider between 7A-7P or the covering provider during after hours 7P-7A, please log into the web site [email or web address] and access using universal [facility] password for that web site. If you do not have the password, please call the hospital operator.  08/23/2018, 11:44 AM

## 2018-08-24 DIAGNOSIS — R945 Abnormal results of liver function studies: Secondary | ICD-10-CM | POA: Diagnosis not present

## 2018-08-24 DIAGNOSIS — I10 Essential (primary) hypertension: Secondary | ICD-10-CM | POA: Diagnosis not present

## 2018-08-24 DIAGNOSIS — K7200 Acute and subacute hepatic failure without coma: Secondary | ICD-10-CM | POA: Diagnosis not present

## 2018-08-24 DIAGNOSIS — I5041 Acute combined systolic (congestive) and diastolic (congestive) heart failure: Secondary | ICD-10-CM | POA: Diagnosis not present

## 2018-08-24 DIAGNOSIS — I5020 Unspecified systolic (congestive) heart failure: Secondary | ICD-10-CM | POA: Diagnosis not present

## 2018-08-24 DIAGNOSIS — R2689 Other abnormalities of gait and mobility: Secondary | ICD-10-CM | POA: Diagnosis not present

## 2018-08-24 DIAGNOSIS — E1100 Type 2 diabetes mellitus with hyperosmolarity without nonketotic hyperglycemic-hyperosmolar coma (NKHHC): Secondary | ICD-10-CM | POA: Diagnosis not present

## 2018-08-24 DIAGNOSIS — N178 Other acute kidney failure: Secondary | ICD-10-CM | POA: Diagnosis not present

## 2018-08-24 DIAGNOSIS — L298 Other pruritus: Secondary | ICD-10-CM | POA: Diagnosis not present

## 2018-08-24 DIAGNOSIS — N179 Acute kidney failure, unspecified: Secondary | ICD-10-CM | POA: Diagnosis not present

## 2018-08-24 DIAGNOSIS — Z794 Long term (current) use of insulin: Secondary | ICD-10-CM | POA: Diagnosis not present

## 2018-08-24 DIAGNOSIS — E785 Hyperlipidemia, unspecified: Secondary | ICD-10-CM | POA: Diagnosis not present

## 2018-08-24 DIAGNOSIS — M6281 Muscle weakness (generalized): Secondary | ICD-10-CM | POA: Diagnosis not present

## 2018-08-24 DIAGNOSIS — F411 Generalized anxiety disorder: Secondary | ICD-10-CM | POA: Diagnosis not present

## 2018-08-24 LAB — HEPATIC FUNCTION PANEL
ALT: 350 U/L — ABNORMAL HIGH (ref 0–44)
AST: 305 U/L — ABNORMAL HIGH (ref 15–41)
Albumin: 2.9 g/dL — ABNORMAL LOW (ref 3.5–5.0)
Alkaline Phosphatase: 315 U/L — ABNORMAL HIGH (ref 38–126)
Bilirubin, Direct: 0.3 mg/dL — ABNORMAL HIGH (ref 0.0–0.2)
Indirect Bilirubin: 0.6 mg/dL (ref 0.3–0.9)
Total Bilirubin: 0.9 mg/dL (ref 0.3–1.2)
Total Protein: 8.2 g/dL — ABNORMAL HIGH (ref 6.5–8.1)

## 2018-08-24 LAB — GLUCOSE, CAPILLARY
Glucose-Capillary: 111 mg/dL — ABNORMAL HIGH (ref 70–99)
Glucose-Capillary: 86 mg/dL (ref 70–99)
Glucose-Capillary: 141 mg/dL — ABNORMAL HIGH (ref 70–99)

## 2018-08-24 LAB — FANA STAINING PATTERNS: Homogeneous Pattern: 1:1280 {titer}

## 2018-08-24 LAB — ANTINUCLEAR ANTIBODIES, IFA: ANA Ab, IFA: POSITIVE — AB

## 2018-08-24 MED ORDER — SPIRONOLACTONE 25 MG PO TABS: 12.5000 mg | ORAL_TABLET | Freq: Every day | ORAL | Status: DC

## 2018-08-24 MED ORDER — TORSEMIDE 20 MG PO TABS: 20.0000 mg | ORAL_TABLET | Freq: Every day | ORAL | Status: DC | PRN

## 2018-08-24 MED ORDER — LOSARTAN POTASSIUM 25 MG PO TABS: 25.0000 mg | ORAL_TABLET | Freq: Two times a day (BID) | ORAL | Status: DC

## 2018-08-24 MED ORDER — DIPHENHYDRAMINE HCL 25 MG PO CAPS: 25.0000 mg | ORAL_CAPSULE | Freq: Four times a day (QID) | ORAL | Status: DC | PRN

## 2018-08-24 MED ORDER — INSULIN ASPART 100 UNIT/ML ~~LOC~~ SOLN: 5.0000 [IU] | Freq: Three times a day (TID) | SUBCUTANEOUS | Status: DC

## 2018-08-24 MED ORDER — ALPRAZOLAM 1 MG PO TABS: 1.0000 mg | ORAL_TABLET | Freq: Every evening | ORAL | 0 refills | Status: DC | PRN

## 2018-08-24 MED ORDER — INSULIN GLARGINE 100 UNIT/ML ~~LOC~~ SOLN: 22.0000 [IU] | Freq: Every day | SUBCUTANEOUS | Status: DC

## 2018-08-24 MED ORDER — INSULIN ASPART 100 UNIT/ML ~~LOC~~ SOLN: 0.0000 [IU] | Freq: Three times a day (TID) | SUBCUTANEOUS | Status: DC

## 2018-08-24 NOTE — Clinical Social Work Placement (Addendum)
   CLINICAL SOCIAL WORK PLACEMENT  NOTE *DISCHARGED TO [facility] AND REHAB VIA AMBULANCE  Date:  08/24/2018  Patient Details  Name: Gloria [name] MRN: [id number] Date of Birth: 05/13/1951  Clinical Social Work is seeking post-discharge placement for this patient at the [place] level of care (*CSW will initial, date and re-position this form in  chart as items are completed):  Yes   Patient/family provided with [facility] Work Department's list of facilities offering this level of care within the geographic area requested by the patient (or if unable, by the patient's family).  Yes   Patient/family informed of their freedom to choose among providers that offer the needed level of care, that participate in Medicare, Medicaid or managed care program needed by the patient, have an available bed and are willing to accept the patient.  Yes   Patient/family informed of [facility]'s ownership interest in [facility] and [facility], as well as of the fact that they are under no obligation to receive care at these facilities.  PASRR submitted to [employer] on 08/21/18(PASRR Manual Review - MUST RK#[id number])     PASRR number received on  08/24/18 - [id number] E, eff. 08/24/18 - 09/23/18   Existing PASRR number confirmed on       FL2 transmitted to all facilities in geographic area requested by pt/family on 08/20/18     FL2 transmitted to all facilities within larger geographic area on       Patient informed that his/her managed care company has contracts with or will negotiate with certain facilities, including the following:        Yes   Patient/family informed of bed offers received.  Patient chooses bed at [facility]     Physician recommends and patient chooses bed at      Patient to be transferred to [facility] on  08/24/18.  Patient to be transferred to facility by  son [name]    Patient family notified on  08/24/18 of transfer.  Name of  family member notified:   [name], [name] by phone [phone]      PHYSICIAN       Additional Comment:    _______________________________________________ Sable Feil, LCSW 08/24/2018, 3:31 PM

## 2018-08-24 NOTE — Progress Notes (Signed)
Gloria [name] to be D/C'd Rehab per MD order.  Discussed prescriptions and follow up appointments with the patient. Prescriptions given to patient, medication list explained in detail. Pt verbalized understanding.  Allergies as of 08/24/2018      Reactions   Compazine Other (See Comments)   Stroke-like symptoms   Glipizide Shortness Of Breath, Other (See Comments)   Dyspnea   Haloperidol Decanoate Other (See Comments)   Extrapyramidal syndrome   Penicillins Nausea And Vomiting   Did it involve swelling of the face/tongue/throat, SOB, or low BP? No, N and V Did it involve sudden or severe rash/hives, skin peeling, or any reaction on the inside of your mouth or nose? No Did you need to seek medical attention at a hospital or doctor's office? No When did it last happen? "a long time ago" If all above answers are "NO", may proceed with cephalosporin use.   Metformin And Related Nausea Only, Other (See Comments)   GI upset   Codeine Itching   Lisinopril Cough      Medication List    STOP taking these medications   atorvastatin 40 MG tablet Commonly known as:  LIPITOR   feeding supplement (ENSURE ENLIVE) Liqd   potassium chloride 10 MEQ tablet Commonly known as:  KLOR-CON 10   sacubitril-valsartan 49-51 MG Commonly known as:  ENTRESTO     TAKE these medications   ALPRAZolam 1 MG tablet Commonly known as:  XANAX Take 1 tablet (1 mg total) by mouth at bedtime as needed for anxiety.   blood glucose meter kit and supplies Kit Dispense based on patient and insurance preference. Use up to four times daily as directed. (FOR ICD-9 250.00, 250.01). For QAC - HS accuchecks.   camphor-menthol lotion Commonly known as:  SARNA Apply 1 application topically 2 (two) times daily.   carvedilol 3.125 MG tablet Commonly known as:  COREG Take 1 tablet (3.125 mg total) by mouth 2 (two) times daily with a meal.   diphenhydrAMINE 25 mg capsule Commonly known as:  BENADRYL Take 1 capsule (25  mg total) by mouth every 6 (six) hours as needed for itching.   gabapentin 100 MG capsule Commonly known as:  NEURONTIN Take 1 capsule (100 mg total) by mouth 3 (three) times daily.   glucose blood test strip Commonly known as:  ACCU-CHEK AVIVA Use as instructed four times per day   ACCU-CHEK AVIVA PLUS test strip Generic drug:  glucose blood USE AS DIRECTED FOUR TIMES DAILY   insulin aspart 100 UNIT/ML injection Commonly known as:  NOVOLOG Inject 0-9 Units into the skin 3 (three) times daily with meals. CBG < 70: implement hypoglycemia protocol CBG 70 - 120: 0 units CBG 121 - 150: 1 unit CBG 151 - 200: 2 units CBG 201 - 250: 3 units CBG 251 - 300: 5 units CBG 301 - 350: 7 units CBG 351 - 400: 9 units CBG > 400: call MD. What changed:    how much to take  how to take this  when to take this  additional instructions   insulin aspart 100 UNIT/ML injection Commonly known as:  novoLOG Inject 5 Units into the skin 3 (three) times daily with meals. If patient eats >50% of her meals. What changed:  You were already taking a medication with the same name, and this prescription was added. Make sure you understand how and when to take each.   insulin glargine 100 UNIT/ML injection Commonly known as:  LANTUS Inject 0.22 mLs (  22 Units total) into the skin at bedtime. What changed:    how much to take  additional instructions   Insulin Pen Needle 32G X 4 MM Misc Commonly known as:  BD PEN NEEDLE NANO U/F USE ONCE DAILY   Insulin Syringe-Needle U-100 25G X 1" 1 ML Misc For 4 times a day insulin SQ, 1 month supply. Diagnosis E11.65   losartan 25 MG tablet Commonly known as:  COZAAR Take 1 tablet (25 mg total) by mouth 2 (two) times daily.   spironolactone 25 MG tablet Commonly known as:  ALDACTONE Take 0.5 tablets (12.5 mg total) by mouth daily. What changed:  how much to take   torsemide 20 MG tablet Commonly known as:  DEMADEX Take 1 tablet (20 mg total) by mouth  daily as needed (Leg edema, dyspnea, weight gain of greater than 3 pounds per day or 5 pounds in a week.). What changed:    when to take this  reasons to take this       Vitals:   08/24/18 0840 08/24/18 1809  BP: 127/61 106/60  Pulse: 68 79  Resp: 18 18  Temp: 98.2 F (36.8 C) 97.9 F (36.6 C)  SpO2: 100% 98%    Skin clean, dry and intact without evidence of skin break down, no evidence of skin tears noted. IV catheter discontinued intact. Site without signs and symptoms of complications. Dressing and pressure applied. Pt denies pain at this time. No complaints noted.  An After Visit Summary was printed and given to the patient. Patient escorted via [place], and D/C to rehab via private auto.

## 2018-08-24 NOTE — Consult Note (Addendum)
   [facility] CM Inpatient Consult   08/24/2018  Gloria [name] 17-Dec-1950 [id number]   (correction: 30 day readmission) updated:  Patient screened for 30 [day] readmission and 3 hospitalizations to check if potential [facility] services are needed . Patient was hospitalized for this 68 year old female, lives alone and independent, PMH of HTN, HLD, DM, asthma, depression, chronic systolic CHF, LVEF 53%, migraine headaches, OSA not on CPAP, cirrhosis, recently hospitalized 1/17-1/19 due to [facility] in [place], presented with 1 week history of generalized weakness.  Chart review is currently recommending patient for skilled nursing for post hospital needs.  No currently community follow up needs noted.  If diposition changes and community follow up needed: Please place a [facility] Care Management consult or for questions contact:   Natividad Brood, RN BSN [facility] Liaison  [phone] business mobile phone Toll free office [phone]

## 2018-08-24 NOTE — Progress Notes (Signed)
Physical Therapy Treatment Patient Details Name: Gloria [name] MRN: [id number] DOB: 1951/01/27 Today's Date: 08/24/2018    History of Present Illness Pt is a 68 y/o female with a PMH significant for HTN, DM, asthma, depression, CHF with EF of 45%, migraine headache, OSA not on CPAP, cirrhosis, who presents with generalized weakness.    PT Comments    Pt progressing towards physical therapy goals. Continues to require increased encouragement to participate - focus of session was gait training however at end of session pt agreeable to standing ADL task at sink, until lunch tray came. Pt then refusing to do any more until she got to eat lunch. SNF remains appropriate as strength and tolerance for functional activity remains decreased.   Follow Up Recommendations  SNF;Supervision/Assistance - 24 hour     Equipment Recommendations  None recommended by PT    Recommendations for Other Services       Precautions / Restrictions Precautions Precautions: Fall Restrictions Weight Bearing Restrictions: No    Mobility  Bed Mobility Overal bed mobility: Needs Assistance Bed Mobility: Supine to Sit     Supine to sit: Supervision     General bed mobility comments: Pt was able to transition to EOB without assistance. Increased time required with HOB elevated.   Transfers Overall transfer level: Needs assistance Equipment used: Rolling walker (2 wheeled) Transfers: Sit to/from Stand Sit to Stand: Min guard         General transfer comment: VC's for hand placement on seated surface for safety during stand>sit. Pt continues to demonstrate uncontrolled descent with cues required for closer walker proximity for safety. Hands on guarding provided for power-up to full stand.  Ambulation/Gait Ambulation/Gait assistance: Min guard Gait Distance (Feet): 225 Feet Assistive device: Rolling walker (2 wheeled) Gait Pattern/deviations: Step-through pattern Gait velocity: Decreased Gait  velocity interpretation: 1.31 - 2.62 ft/sec, indicative of limited community ambulator General Gait Details: VC's for closer walker proximity and general safety. Slow and effortful by end of gait training due to fatigue.   Stairs             Wheelchair Mobility    Modified Rankin (Stroke Patients Only)       Balance Overall balance assessment: Needs assistance Sitting-balance support: Feet supported Sitting balance-Leahy Scale: Fair     Standing balance support: Bilateral upper extremity supported Standing balance-Leahy Scale: Poor Standing balance comment: reliant on the RW due to weakness                            Cognition Arousal/Alertness: Awake/alert Behavior During Therapy: WFL for tasks assessed/performed Overall Cognitive Status: Within Functional Limits for tasks assessed                                        Exercises      General Comments General comments (skin integrity, edema, etc.): Initially agreeable for standing ADL's at the sink, however once lunch came, pt refused to do any more other than washing her hands.       Pertinent Vitals/Pain Pain Assessment: No/denies pain    Home Living                      Prior Function            PT Goals (current goals can now be found in the care  plan section) Acute Rehab PT Goals Patient Stated Goal: go home PT Goal Formulation: With patient Time For Goal Achievement: 08/25/18 Potential to Achieve Goals: Good Progress towards PT goals: Progressing toward goals    Frequency    Min 2X/week      PT Plan Frequency needs to be updated    Co-evaluation              AM-PAC PT "6 Clicks" Mobility   Outcome Measure  Help needed turning from your back to your side while in a flat bed without using bedrails?: A Little Help needed moving from lying on your back to sitting on the side of a flat bed without using bedrails?: A Little Help needed moving to  and from a bed to a chair (including a wheelchair)?: A Little Help needed standing up from a chair using your arms (e.g., wheelchair or bedside chair)?: A Little Help needed to walk in hospital room?: A Little Help needed climbing 3-5 steps with a railing? : A Lot 6 Click Score: 17    End of Session Equipment Utilized During Treatment: Gait belt Activity Tolerance: Patient tolerated treatment well Patient left: in bed;with call bell/phone within reach Nurse Communication: Mobility status PT Visit Diagnosis: Unsteadiness on feet (R26.81);Other abnormalities of gait and mobility (R26.89);Muscle weakness (generalized) (M62.81)     Time: 1021-1173 PT Time Calculation (min) (ACUTE ONLY): 14 min  Charges:  $Gait Training: 8-22 mins                     [name], PT, DPT Acute Rehabilitation Services Pager: [phone] Office: [phone]    [name] 08/24/2018, 12:01 PM

## 2018-08-24 NOTE — Care Management Note (Signed)
Case Management Note Manya Silvas, RN MSN CCM Transitions of Care 10[facility] [place] [phone]  Patient Details  Name: EVERLEY EVORA MRN: [id number] Date of Birth: 26-Oct-1950  Subjective/Objective:     HHNK               Action/Plan: Spoke with patient at bedside. PTA home alone. States her son checks on her. Uses [facility] for prescriptions. States she would like CIR for therapy. Discussed SNF recommendations-patient verbalized understanding but disappointed. Advised that CSW would see her to discuss her choices. CM to continue to follow for transition of care needs.   Expected Discharge Date:  08/24/18               Expected Discharge Plan:  Skilled Nursing Facility  In-House Referral:  Clinical Social Work  Discharge planning Services  CM Consult  Post Acute Care Choice:  NA Choice offered to:  NA  DME Arranged:  N/A DME Agency:  NA  HH Arranged:  NA HH Agency:  NA  Status of Service:  Completed, signed off  If discussed at [employer] of Stay Meetings, dates discussed:    Additional Comments: Patient to transition to SNF today. No CM transition of care needs identified.   Bartholomew Crews, RN 08/24/2018, 4:14 PM

## 2018-08-24 NOTE — Discharge Instructions (Signed)

## 2018-08-24 NOTE — Discharge Summary (Signed)
Physician Discharge Summary  Gloria [name] OHY:[id number] DOB: January 26, 1951  PCP: Biagio Borg, MD  Admit date: 08/17/2018 Discharge date: 08/24/2018  Recommendations for Outpatient Follow-up:  1. MD at SNF in 2 to 3 days.  Recommend weekly labs (CBC & CMP) follow-up.  Please follow ANA that was sent from the hospital. 2. Ellouise Newer, PA/[facility] on 09/11/2018 at 10:45 AM for follow-up of abnormal liver function test. 3. [facility] specialty clinics/Advanced HF clinic: 09/02/2018 at 9 AM. 4. Dr. Cathlean Cower, PCP upon discharge from SNF.  Home Health: Patient being discharged to [facility] Equipment/Devices: TBD at [facility].  Discharge Condition: Improved and stable CODE STATUS: Full Diet recommendation: Heart healthy & diabetic diet.  Discharge Diagnoses:  Principal Problem:   Hyperosmolar non-ketotic state in patient with type 2 diabetes mellitus ([place]) Active Problems:   Hyperlipidemia   Anxiety state   Essential hypertension   Uncontrolled type 2 diabetes mellitus with diabetic neuropathy, without long-term current use of insulin (HCC)   Cirrhosis of liver with ascites (HCC)   Chronic combined systolic (congestive) and diastolic (congestive) heart failure (HCC)   Abnormal LFTs   Hypotension   AKI (acute kidney injury) ([place])   Lactic acidosis   Type 2 diabetes mellitus with hyperosmolar nonketotic hyperglycemia (HCC)   Brief Summary: 68 year old female, lives alone and independent, PMH of HTN, HLD, DM, asthma, depression, chronic systolic CHF, LVEF 94%, migraine headaches, OSA not on CPAP, cirrhosis, recently hospitalized 1/17-1/19 due to [place] in DM2, presented with 1 week history of generalized weakness.  Transiently hypotensive in ED 84/52, normalized after IV fluid bolus, also noted in the ED with blood sugar 604 but not in DKA, creatinine 1.85, BUN 37 and mild transaminitis.  She was diagnosed with HONK in type II DM/IDDM, poorly controlled, briefly  placed on IV insulin drip and then transitioned to Lantus and NovoLog mealtime and SSI.    Adjusted insulins with improved glycemic control.  Course complicated by abnormal LFTs, [facility] consulted and recommend outpatient follow-up and evaluation.   Assessment & Plan:  1. Hyperosmolar nonketotic state in patient with poorly controlled type II DM/IDDM: A1c 10 on 07/18/2018.  Likely precipitated by dietary noncompliance-patient drinks lots of regular sodas.  Briefly placed on IV insulin drip, as CBGs improved, transition to Lantus 25 units at bedtime, NovoLog SSI and mealtime.  DM coordinator and dietitian input appreciated.  Patient counseled extensively regarding compliance with all aspects of her DM care and she verbalized understanding.  Increase NovoLog mealtime to 5 units.  Much improved glycemic control.  FBG however 83 on 2/20 and at risk for hypoglycemia.  Hence reduced Lantus to 22 units at bedtime, continue NovoLog sensitive SSI without bedtime scale and NovoLog mealtime 5 units 3 times daily.  CBGs well controlled on current insulin regimen, continue. 2. Acute kidney injury: Secondary to dehydration from hyperglycemia.  Resolved after IV fluid hydration.  Follow CMP closely now that Aldactone, losartan and PRN torsemide were initiated at discharge. 3. Hypotension: Present on admission.  Due to dehydration.  Resolved after IV fluids. 4. Lactic acidosis: Secondary to dehydration and hypotension.  Resolved. 5. Dehydration with hyponatremia: Secondary to hyperglycemia.  Resolved. 6. Transaminitis: No GI symptoms reported.  Could be related to shock liver from hypotension on admission.  Has had paracentesis x2 back in November thought to be cardiogenic ascites.  Abdominal ultrasound 2/17 without acute findings and shows normal liver parenchymal echotexture and portal vein flow.  Acute hepatitis  panel negative.  LFTs had slowly improved but subsequently plateaued.  [facility] consultation and  follow-up appreciated, concern for autoimmune hepatitis.  Repeat anti-smooth muscle antibody IgG: WNL. Repeat ANA still pending.  Discussed with GI and they recommend outpatient LFT panel follow-up weekly for 2 to 3 weeks, if does not improve or worsens then follow-up with Dr. Otho Darner and would likely need liver biopsy and to hold Entresto and Lipitor in the interim.  LFTs slightly improved.  No change in plan as noted above.   7. Generalized weakness/deconditioning: PT and OT recommend SNF.  Patient reluctant but agreeable.  CT head requested by son and no acute findings. 8. Chronic combined systolic and diastolic CHF: TTE on 2/50/0370: LVEF 45-50% compared to 05/2018 where LVEF 25%.  Seen by cardiology in office on 07/18/2018 and torsemide reduced.  Repeat echo planned on 2/29.  Currently compensated.  Entresto, Aldactone and torsemide were held in the hospital due to recent hypotension and acute kidney injury.  Continue carvedilol.    I discussed with Shirley Friar, PA-C/Advanced Heart Failure team who has arranged close outpatient follow-up and recommends resuming Aldactone 12.5 mg daily, substituting Entresto with losartan 25 mg twice daily, torsemide 20 mg daily as needed.  He indicates that Delene Loll is less likely to cause LFT abnormalities and hopefully can be restarted pending improvement in LFTs.  Concern that patient may again get volume overloaded and hence resuming these medications. 9. Hyperlipidemia: Statins held due to elevated LFTs. 10. Anxiety state: Stable.   11. Essential hypertension: Hypotensive on admission, resolved.  Now controlled on carvedilol 3.125 mg twice daily. 12. Pruritus: Unclear etiology,?  Related to hepatic picture.  PRN Vistaril was not helping.  Patient reports history of generalized pruritus for approximately a month.  Trial of PRN Benadryl and topical Sarna lotion.  Benadryl seems to be helping better. 13. Normocytic anemia: Unclear etiology but stable.   Follow CBC as outpatient.    Consultants:  [facility]  Procedures:  None  Discharge Instructions  Discharge Instructions    (HEART FAILURE PATIENTS) Call MD:  Anytime you have any of the following symptoms: 1) 3 pound weight gain in 24 hours or 5 pounds in 1 week 2) shortness of breath, with or without a dry hacking cough 3) swelling in the hands, feet or stomach 4) if you have to sleep on extra pillows at night in order to breathe.   Complete by:  As directed    Amb Referral to Nutrition and Diabetic E   Complete by:  As directed    Call MD for:  difficulty breathing, headache or visual disturbances   Complete by:  As directed    Call MD for:  extreme fatigue   Complete by:  As directed    Call MD for:  persistant dizziness or light-headedness   Complete by:  As directed    Call MD for:  persistant nausea and vomiting   Complete by:  As directed    Call MD for:  severe uncontrolled pain   Complete by:  As directed    Call MD for:  temperature >100.4   Complete by:  As directed    Diet - low sodium heart healthy   Complete by:  As directed    Diet Carb Modified   Complete by:  As directed    Increase activity slowly   Complete by:  As directed        Medication List    STOP taking these  medications   atorvastatin 40 MG tablet Commonly known as:  LIPITOR   feeding supplement (ENSURE ENLIVE) Liqd   potassium chloride 10 MEQ tablet Commonly known as:  KLOR-CON 10   sacubitril-valsartan 49-51 MG Commonly known as:  ENTRESTO     TAKE these medications   ALPRAZolam 1 MG tablet Commonly known as:  XANAX Take 1 tablet (1 mg total) by mouth at bedtime as needed for anxiety.   blood glucose meter kit and supplies Kit Dispense based on patient and insurance preference. Use up to four times daily as directed. (FOR ICD-9 250.00, 250.01). For QAC - HS accuchecks.   camphor-menthol lotion Commonly known as:  SARNA Apply 1 application topically 2 (two) times daily.    carvedilol 3.125 MG tablet Commonly known as:  COREG Take 1 tablet (3.125 mg total) by mouth 2 (two) times daily with a meal.   diphenhydrAMINE 25 mg capsule Commonly known as:  BENADRYL Take 1 capsule (25 mg total) by mouth every 6 (six) hours as needed for itching.   gabapentin 100 MG capsule Commonly known as:  NEURONTIN Take 1 capsule (100 mg total) by mouth 3 (three) times daily.   glucose blood test strip Commonly known as:  ACCU-CHEK AVIVA Use as instructed four times per day   ACCU-CHEK AVIVA PLUS test strip Generic drug:  glucose blood USE AS DIRECTED FOUR TIMES DAILY   insulin aspart 100 UNIT/ML injection Commonly known as:  NOVOLOG Inject 0-9 Units into the skin 3 (three) times daily with meals. CBG < 70: implement hypoglycemia protocol CBG 70 - 120: 0 units CBG 121 - 150: 1 unit CBG 151 - 200: 2 units CBG 201 - 250: 3 units CBG 251 - 300: 5 units CBG 301 - 350: 7 units CBG 351 - 400: 9 units CBG > 400: call MD. What changed:    how much to take  how to take this  when to take this  additional instructions   insulin aspart 100 UNIT/ML injection Commonly known as:  novoLOG Inject 5 Units into the skin 3 (three) times daily with meals. If patient eats >50% of her meals. What changed:  You were already taking a medication with the same name, and this prescription was added. Make sure you understand how and when to take each.   insulin glargine 100 UNIT/ML injection Commonly known as:  LANTUS Inject 0.22 mLs (22 Units total) into the skin at bedtime. What changed:    how much to take  additional instructions   Insulin Pen Needle 32G X 4 MM Misc Commonly known as:  BD PEN NEEDLE NANO U/F USE ONCE DAILY   Insulin Syringe-Needle U-100 25G X 1" 1 ML Misc For 4 times a day insulin SQ, 1 month supply. Diagnosis E11.65   losartan 25 MG tablet Commonly known as:  COZAAR Take 1 tablet (25 mg total) by mouth 2 (two) times daily.   spironolactone 25 MG  tablet Commonly known as:  ALDACTONE Take 0.5 tablets (12.5 mg total) by mouth daily. What changed:  how much to take   torsemide 20 MG tablet Commonly known as:  DEMADEX Take 1 tablet (20 mg total) by mouth daily as needed (Leg edema, dyspnea, weight gain of greater than 3 pounds per day or 5 pounds in a week.). What changed:    when to take this  reasons to take this       Contact information for follow-up providers    Levin Erp, PA Follow  up on 09/11/2018.   Specialty:  Gastroenterology Why:  10:45 AM.  Follow-up of abnormal liver tests.  This is Dr. Caroleen Hamman physician assistant. Contact information: [address] [place] [address] [phone]        [facility] Follow up on 09/02/2018.   Specialty:  Cardiology Why:  at 0900 for post hospital follow up. Code for parking is [place]. Can also use valet at [address].  Contact information: [address] [id number] [place] [phone]       MD at SNF. Schedule an appointment as soon as possible for a visit.   Why:  To be seen in 2 to 3 days.  Please see discharge summary for lab follow-up.       Biagio Borg, MD. Schedule an appointment as soon as possible for a visit.   Specialties:  Internal Medicine, Radiology Why:  Upon discharge from SNF. Contact information: [place] [address] [phone]        Skeet Latch, MD .   Specialty:  Cardiology Contact information: [address] [place] [address] [phone]            Contact information for after-discharge care    Destination    [facility] Preferred SNF .   Service:  Skilled Nursing Contact information: [place] [address] [phone]                 Allergies  Allergen Reactions  . Compazine Other (See Comments)    Stroke-like symptoms  .  Glipizide Shortness Of Breath and Other (See Comments)    Dyspnea   . Haloperidol Decanoate Other (See Comments)    Extrapyramidal syndrome  . Penicillins Nausea And Vomiting    Did it involve swelling of the face/tongue/throat, SOB, or low BP? No, N and V Did it involve sudden or severe rash/hives, skin peeling, or any reaction on the inside of your mouth or nose? No Did you need to seek medical attention at a hospital or doctor's office? No When did it last happen? "a long time ago" If all above answers are "NO", may proceed with cephalosporin use.   . Metformin And Related Nausea Only and Other (See Comments)    GI upset  . Codeine Itching  . Lisinopril Cough      Procedures/Studies: Ct Head Wo Contrast  Result Date: 08/18/2018 CLINICAL DATA:  68 y/o  F; generalized muscle weakness. EXAM: CT HEAD WITHOUT CONTRAST TECHNIQUE: Contiguous axial images were obtained from the base of the skull through the vertex without intravenous contrast. COMPARISON:  None. FINDINGS: Brain: No evidence of acute infarction, hemorrhage, hydrocephalus, extra-axial collection or mass lesion/mass effect. Small chronic lacunar infarct within the left basal ganglia. Non-specific white matter hypodensities are compatible with mild chronic microvascular ischemic changes. Mild volume loss of brain. Vascular: No hyperdense vessel or unexpected calcification. Skull: Normal. Negative for fracture or focal lesion. Sinuses/Orbits: No acute finding. Other: None. IMPRESSION: 1. No acute intracranial abnormality. 2. Mild chronic microvascular ischemic changes and volume loss of the brain. Electronically Signed   By: Kristine Garbe M.D.   On: 08/18/2018 16:45   US Abdomen Complete  Result Date: 08/17/2018 CLINICAL DATA:  Right upper quadrant pain EXAM: ABDOMEN ULTRASOUND COMPLETE COMPARISON:  CT abdomen pelvis 05/01/2018 FINDINGS: Gallbladder: No gallstones or wall thickening visualized. No sonographic Murphy sign  noted by sonographer.  Common bile duct: Diameter: 3 mm Liver: No focal lesion identified. Within normal limits in parenchymal echogenicity, despite abnormal appearance on prior CT. Portal vein is patent on color Doppler imaging with normal direction of blood flow towards the liver. IVC: No abnormality visualized. Pancreas: Visualized portion unremarkable. Spleen: Size and appearance within normal limits. Right Kidney: Length: 11.3 cm. Echogenicity within normal limits. No mass or hydronephrosis visualized. Left Kidney: Length: 11.9 cm. Poorly visualized due to overlying bowel gas Abdominal aorta: No aneurysm visualized. Other findings: None. IMPRESSION: 1. No cholelithiasis or other evidence of acute cholecystitis. 2. No acute abnormality of the abdomen. Electronically Signed   By: Ulyses Jarred M.D.   On: 08/17/2018 22:33      Subjective: Ongoing generalized pruritus but somewhat better on Benadryl.  No skin rash.  No chest pain, dyspnea, dizziness, lightheadedness.  No other complaints reported.  As per RN, no acute issues noted.  Discharge Exam:  Vitals:   08/23/18 1658 08/23/18 2108 08/24/18 0520 08/24/18 0840  BP: 122/73 127/62 (!) 132/58 127/61  Pulse: 69 74 66 68  Resp: _0 Temp: 98.5 F (36.9 C) 98.9 F (37.2 C) 98.4 F (36.9 C) 98.2 F (36.8 C)  TempSrc: Oral Oral  Oral  SpO2: 99% 95% 98% 100%  Weight:  74.7 kg    Height:        General exam: Pleasant middle-aged female, moderately built and nourished, lying comfortably propped up in bed without distress.  Oral mucosa moist.  Anicteric. Respiratory system: Clear to auscultation.  No increased work of breathing.   Cardiovascular system: S1 & S2 heard, RRR. No JVD, murmurs, rubs, gallops or clicks. No pedal edema.  Gastrointestinal system: Abdomen is nondistended, soft and nontender. No organomegaly or masses felt. Normal bowel sounds heard.   Central nervous system: Alert and oriented. No focal neurological deficits.   Extremities: Symmetric 5 x 5 power. Skin: Generalized skin appears slightly dry but no rashes or acute findings. Psychiatry: Judgement and insight appear somewhat impaired. Mood & affect flat.     The results of significant diagnostics from this hospitalization (including imaging, microbiology, ancillary and laboratory) are listed below for reference.     Microbiology: Recent Results (from the past 240 hour(s))  Blood Culture (routine x 2)     Status: None   Collection Time: 08/17/18  8:07 PM  Result Value Ref Range Status   Specimen Description BLOOD RIGHT ARM  Final   Special Requests   Final    BOTTLES DRAWN AEROBIC AND ANAEROBIC Blood Culture results may not be optimal due to an inadequate volume of blood received in culture bottles Performed at [facility] [address] [place], [place] [address]    Culture NO GROWTH 5 DAYS  Final   Report Status 08/22/2018 FINAL  Final  Blood Culture (routine x 2)     Status: None   Collection Time: 08/17/18  8:37 PM  Result Value Ref Range Status   Specimen Description BLOOD RIGHT FOREARM  Final   Special Requests   Final    BOTTLES DRAWN AEROBIC ONLY Blood Culture results may not be optimal due to an inadequate volume of blood received in culture bottles Performed at [facility], [place] [address], [place], [place] [address]    Culture NO GROWTH 5 DAYS  Final   Report Status 08/22/2018 FINAL  Final  Urine Culture     Status: Abnormal   Collection Time: 08/17/18  9:55  PM  Result Value Ref Range Status   Specimen Description URINE, CATHETERIZED  Final   Special Requests NONE  Final   Culture (A)  Final    <10,000 COLONIES/mL INSIGNIFICANT GROWTH Performed at [facility], [address] [place], [place] [address]    Report Status 08/19/2018 FINAL  Final     Labs: CBC: Recent Labs  Lab 08/17/18 1953 08/17/18 2118 08/18/18 0249 08/21/18 0523  WBC 6.7 6.4 6.5 7.7  NEUTROABS 4.7 4.6  --   --   HGB 10.2*  10.0* 9.6* 10.0*  HCT 31.4* 29.8* 29.3* 30.5*  MCV 89.5 89.0 88.5 89.2  PLT 222 218 225 528   Basic Metabolic Panel: Recent Labs  Lab 08/17/18 1953 08/17/18 2118 08/18/18 0249 08/19/18 0431  NA 128* 131* 136 139  K 3.9 4.0 4.0 3.8  CL 92* 99 104 107  CO2 21* 21* 22 21*  GLUCOSE 604* 447* 227* 129*  BUN 37* 34* 32* 18  CREATININE 1.85* 1.58* 1.50* 0.99  CALCIUM 9.6 9.3 9.2 9.6   Liver Function Tests: Recent Labs  Lab 08/19/18 0431 08/20/18 0605 08/21/18 0523 08/22/18 0403 08/24/18 0338  AST 335* 373* 419* 409* 305*  ALT 395* 403* 402* 392* 350*  ALKPHOS 331* 334* 337* 316* 315*  BILITOT 1.0 1.0 1.3* 1.3* 0.9  PROT 7.2 7.9 8.0 7.9 8.2*  ALBUMIN 2.8* 2.9* 2.8* 2.9* 2.9*   BNP (last 3 results) Recent Labs    05/02/18 0614 07/17/18 1711 08/17/18 1953  BNP 1,255.5* 74.1 17.5   CBG: Recent Labs  Lab 08/23/18 1137 08/23/18 1616 08/23/18 2108 08/24/18 0711 08/24/18 1117  GLUCAP 165* 94 172* 111* 86   Urinalysis    Component Value Date/Time   COLORURINE STRAW (A) 08/17/2018 2120   APPEARANCEUR CLEAR 08/17/2018 2120   LABSPEC 1.010 08/17/2018 2120   PHURINE 6.0 08/17/2018 2120   GLUCOSEU >=500 (A) 08/17/2018 2120   GLUCOSEU NEGATIVE 02/25/2018 1500   HGBUR NEGATIVE 08/17/2018 2120   [place] NEGATIVE 08/17/2018 2120   BILIRUBINUR negatvie 11/04/2016 1312   [place] 08/17/2018 2120   PROTEINUR NEGATIVE 08/17/2018 2120   UROBILINOGEN 0.2 02/25/2018 1500   NITRITE NEGATIVE 08/17/2018 2120   LEUKOCYTESUR NEGATIVE 08/17/2018 2120      Time coordinating discharge: 50 minutes  SIGNED:  Vernell Leep, MD, FACP, [place]. [facility]  To contact the attending provider between 7A-7P or the covering provider during after hours 7P-7A, please log into the web site [email or web address] and access using universal [place] password for that web site. If you do not have the password, please call the hospital operator.

## 2018-08-24 NOTE — Care Management Important Message (Signed)
Important Message  Patient Details  Name: Gloria [name] MRN: [id number] Date of Birth: 04-27-1951   Medicare Important Message Given:  Yes    Orbie Pyo 08/24/2018, 4:17 PM

## 2018-08-24 NOTE — Progress Notes (Signed)
Occupational Therapy Treatment Patient Details Name: Gloria [name] MRN: [id number] DOB: Jul 05, 1950 Today's Date: 08/24/2018    History of present illness Pt is a 68 y/o female with a PMH significant for HTN, DM, asthma, depression, CHF with EF of 45%, migraine headache, OSA not on CPAP, cirrhosis, who presents with generalized weakness.   OT comments  Pt required encouragement for OOB activity. Pt participated in bathing tasks at EOB, UB dressing standing at RW, toileting, toilet and shower transfers. Pt required cues for hand placement, speed of descent during stand - sit transitions with RW to recliner. Pt educated on benefits/importance of OOB activity and risks prolonged time spent in in bed as well as activity level expected at SNF for ST rehab. OT will continue to follow acutely  Follow Up Recommendations  SNF    Equipment Recommendations  Other (comment)(TBD at next venue of care)    Recommendations for Other Services      Precautions / Restrictions Precautions Precautions: Fall Restrictions Weight Bearing Restrictions: No       Mobility Bed Mobility Overal bed mobility: Needs Assistance Bed Mobility: Supine to Sit     Supine to sit: Supervision     General bed mobility comments: Pt was able to transition to EOB without assistance. Increased time required with HOB elevated.   Transfers Overall transfer level: Needs assistance Equipment used: Rolling walker (2 wheeled) Transfers: Sit to/from Stand Sit to Stand: Min guard         General transfer comment: cues for hand placement, speed of descent during stand - sit transitions     Balance Overall balance assessment: Needs assistance Sitting-balance support: Feet supported Sitting balance-Leahy Scale: Fair     Standing balance support: Bilateral upper extremity supported;During functional activity Standing balance-Leahy Scale: Poor Standing balance comment: reliant on the RW due to weakness                            ADL either performed or assessed with clinical judgement   ADL Overall ADL's : Needs assistance/impaired     Grooming: Wash/dry hands;Wash/dry face;Standing;Min guard   Upper Body Bathing: Set up;Supervision/ safety;Sitting Upper Body Bathing Details (indicate cue type and reason): simulated Lower Body Bathing: Minimal assistance;Sit to/from stand Lower Body Bathing Details (indicate cue type and reason): simulated Upper Body Dressing : Min guard;Standing Upper Body Dressing Details (indicate cue type and reason): donning gown     Toilet Transfer: Ambulation;RW;Min guard   Toileting- Clothing Manipulation and Hygiene: Sit to/from stand;Min guard   Tub/ Shower Transfer: Rolling walker;Ambulation;3 in 1;Grab bars;Min guard   Functional mobility during ADLs: Rolling walker;Cueing for safety;Min guard       Vision Patient Visual Report: No change from baseline     Perception     Praxis      Cognition Arousal/Alertness: Awake/alert Behavior During Therapy: WFL for tasks assessed/performed Overall Cognitive Status: Within Functional Limits for tasks assessed                                          Exercises     Shoulder Instructions       General Comments Initially agreeable for standing ADL's at the sink, however once lunch came, pt refused to do any more other than washing her hands.     Pertinent Vitals/ Pain  Pain Assessment: No/denies pain  Home Living                                          Prior Functioning/Environment              Frequency  Min 2X/week        Progress Toward Goals  OT Goals(current goals can now be found in the care plan section)  Progress towards OT goals: Progressing toward goals  Acute Rehab OT Goals Patient Stated Goal: go home  Plan Discharge plan remains appropriate    Co-evaluation                 AM-PAC OT "6 Clicks" Daily Activity      Outcome Measure   Help from another person eating meals?: None Help from another person taking care of personal grooming?: A Little Help from another person toileting, which includes using toliet, bedpan, or urinal?: A Little Help from another person bathing (including washing, rinsing, drying)?: A Little Help from another person to put on and taking off regular upper body clothing?: A Little Help from another person to put on and taking off regular lower body clothing?: A Little 6 Click Score: 19    End of Session Equipment Utilized During Treatment: Gait belt;Other (comment)  OT Visit Diagnosis: Other abnormalities of gait and mobility (R26.89);Muscle weakness (generalized) (M62.81)   Activity Tolerance Patient tolerated treatment well   Patient Left in chair;with chair alarm set   Nurse Communication          Time: 8003-4917 OT Time Calculation (min): 18 min  Charges: OT General Charges $OT Visit: 1 Visit OT Treatments $Self Care/Home Management : 8-22 mins     Emmit Alexanders [facility] 08/24/2018, 2:04 PM

## 2018-08-25 ENCOUNTER — Encounter: Payer: Self-pay | Admitting: Internal Medicine

## 2018-08-25 ENCOUNTER — Telehealth: Payer: Self-pay | Admitting: *Deleted

## 2018-08-25 DIAGNOSIS — L298 Other pruritus: Secondary | ICD-10-CM | POA: Diagnosis not present

## 2018-08-25 DIAGNOSIS — I5041 Acute combined systolic (congestive) and diastolic (congestive) heart failure: Secondary | ICD-10-CM | POA: Diagnosis not present

## 2018-08-25 DIAGNOSIS — K7200 Acute and subacute hepatic failure without coma: Secondary | ICD-10-CM | POA: Diagnosis not present

## 2018-08-25 DIAGNOSIS — E1100 Type 2 diabetes mellitus with hyperosmolarity without nonketotic hyperglycemic-hyperosmolar coma (NKHHC): Secondary | ICD-10-CM | POA: Diagnosis not present

## 2018-08-25 DIAGNOSIS — N178 Other acute kidney failure: Secondary | ICD-10-CM | POA: Diagnosis not present

## 2018-08-25 NOTE — Telephone Encounter (Signed)
Pt's son has been informed that he can drop off the FL2 to be filled out by PCP.

## 2018-08-25 NOTE — Telephone Encounter (Signed)
Pt was on TCM report admitted 08/17/18 for Hypotension due from dehydration and Hyperosmolar non-ketotic state in patient with type 2 diabetes mellitus. Pt blood sugar 604 but not in DKA, creatinine 1.85, BUN 37 and mild transaminitis. Pt D/C 08/24/18 to SNF. Per summary upon discharge from SNF will need to see PCP in 1 week.Marland KitchenJohny Chess

## 2018-08-25 NOTE — Telephone Encounter (Signed)
Pt states his mom was admitted to [facility] yesterday due to recent hospital stay for elevated blood sugars and will be there for 14 days. He states he is unable to bring her in and would like to see if the form can be completed.

## 2018-08-25 NOTE — Telephone Encounter (Signed)
I guess he means the Fl2 - I guess should be ok

## 2018-09-02 ENCOUNTER — Telehealth: Payer: Self-pay

## 2018-09-02 ENCOUNTER — Telehealth: Payer: Self-pay | Admitting: Internal Medicine

## 2018-09-02 ENCOUNTER — Inpatient Hospital Stay (HOSPITAL_COMMUNITY): Payer: Medicare Other

## 2018-09-02 NOTE — Telephone Encounter (Signed)
Verbal orders given  

## 2018-09-02 NOTE — Telephone Encounter (Signed)
Copied from [place] [phone]. Topic: General - Other >> Sep 02, 2018  3:52 PM Yvette Rack wrote: Reason for CRM: Levada Dy with [facility] stated patient son informed her that the glucose monitor is not working properly. Levada Dy stated patient son returned the meter to the pharmacy but they would not take it back. Requests Rx for a new glucose meter. Cb# [phone]

## 2018-09-02 NOTE — Telephone Encounter (Signed)
Copied from [place] [phone]. Topic: Quick Communication - See Telephone Encounter >> Sep 02, 2018  4:49 PM Ivar Drape wrote: CRM for notification. See Telephone encounter for: 09/02/18. Patient stated she need a blood glucose meter kit and supplies KIT and have it sent to her preferred pharmacy [facility] on [address]. Patient says she needs this right away because hers broke and she needs to check her levels.

## 2018-09-02 NOTE — Telephone Encounter (Signed)
Copied from [place] [phone]. Topic: Quick Communication - Home Health Verbal Orders >> Sep 02, 2018  1:27 PM Yvette Rack wrote: Caller/Agency: Liji with [facility] Number: [phone] Requesting OT/PT/Skilled Nursing/Social Work: PT Frequency: start of care - Pt was scheduled for 09/02/18 but canceled due to an appt. Liji requests start of care to begin 09/03/18

## 2018-09-02 NOTE — Telephone Encounter (Signed)
See 2nd telephone encounter from 09/02/18

## 2018-09-03 ENCOUNTER — Other Ambulatory Visit: Payer: Self-pay | Admitting: Internal Medicine

## 2018-09-03 MED ORDER — BLOOD GLUCOSE MONITOR KIT: PACK | 1 refills | Status: DC

## 2018-09-03 NOTE — Telephone Encounter (Signed)
Liji with [facility] called in to make Dr. Jenny Reichmann aware that pt refused services. Pt feels that she doesn't need PT services.    Phone: [phone]

## 2018-09-03 NOTE — Telephone Encounter (Signed)
Copied from [place] [phone]. Topic: Quick Communication - Rx Refill/Question >> Sep 03, 2018  1:38 PM Sheppard Coil, [employer] L wrote: Medication: blood glucose meter kit and supplies KIT  Has the patient contacted their pharmacy? Yes - needs new script (Agent: If no, request that the patient contact the pharmacy for the refill.) (Agent: If yes, when and what did the pharmacy advise?)  Preferred Pharmacy (with phone number or street name): [id number] - [place], [place] [phone] (Phone) [phone] (Fax)  Agent: Please be advised that RX refills may take up to 3 business days. We ask that you follow-up with your pharmacy.

## 2018-09-04 ENCOUNTER — Ambulatory Visit (INDEPENDENT_AMBULATORY_CARE_PROVIDER_SITE_OTHER): Payer: Medicare Other | Admitting: Internal Medicine

## 2018-09-04 ENCOUNTER — Encounter: Payer: Self-pay | Admitting: Internal Medicine

## 2018-09-04 VITALS — BP 104/52 | HR 60 | Temp 97.9°F | Ht 67.0 in | Wt 161.0 lb

## 2018-09-04 DIAGNOSIS — I10 Essential (primary) hypertension: Secondary | ICD-10-CM | POA: Diagnosis not present

## 2018-09-04 DIAGNOSIS — E1140 Type 2 diabetes mellitus with diabetic neuropathy, unspecified: Secondary | ICD-10-CM | POA: Diagnosis not present

## 2018-09-04 DIAGNOSIS — IMO0002 Reserved for concepts with insufficient information to code with codable children: Secondary | ICD-10-CM

## 2018-09-04 DIAGNOSIS — R7989 Other specified abnormal findings of blood chemistry: Secondary | ICD-10-CM

## 2018-09-04 DIAGNOSIS — R945 Abnormal results of liver function studies: Secondary | ICD-10-CM

## 2018-09-04 DIAGNOSIS — E785 Hyperlipidemia, unspecified: Secondary | ICD-10-CM | POA: Diagnosis not present

## 2018-09-04 DIAGNOSIS — E1165 Type 2 diabetes mellitus with hyperglycemia: Secondary | ICD-10-CM

## 2018-09-04 LAB — POCT GLUCOSE (DEVICE FOR HOME USE): POC Glucose: 82 mg/dl (ref 70–99)

## 2018-09-04 MED ORDER — TORSEMIDE 20 MG PO TABS: 20.0000 mg | ORAL_TABLET | Freq: Every day | ORAL | 5 refills | Status: DC | PRN

## 2018-09-04 MED ORDER — LOSARTAN POTASSIUM 25 MG PO TABS: 25.0000 mg | ORAL_TABLET | Freq: Two times a day (BID) | ORAL | 11 refills | Status: DC

## 2018-09-04 MED ORDER — BLOOD GLUCOSE MONITOR KIT: PACK | 1 refills | Status: DC

## 2018-09-04 MED ORDER — CARVEDILOL 3.125 MG PO TABS: 3.1250 mg | ORAL_TABLET | Freq: Two times a day (BID) | ORAL | 11 refills | Status: DC

## 2018-09-04 MED ORDER — GLUCOSE BLOOD VI STRP: ORAL_STRIP | 12 refills | Status: DC

## 2018-09-04 MED ORDER — INSULIN GLARGINE 100 UNIT/ML ~~LOC~~ SOLN: 22.0000 [IU] | Freq: Every day | SUBCUTANEOUS | 5 refills | Status: DC

## 2018-09-04 MED ORDER — LANCETS MISC: 3 refills | Status: DC

## 2018-09-04 MED ORDER — ALPRAZOLAM 1 MG PO TABS: 1.0000 mg | ORAL_TABLET | Freq: Two times a day (BID) | ORAL | 1 refills | Status: DC | PRN

## 2018-09-04 MED ORDER — INSULIN PEN NEEDLE 32G X 4 MM MISC: 3 refills | Status: DC

## 2018-09-04 MED ORDER — INSULIN ASPART 100 UNIT/ML ~~LOC~~ SOLN: 0.0000 [IU] | Freq: Three times a day (TID) | SUBCUTANEOUS | 5 refills | Status: DC

## 2018-09-04 MED ORDER — SPIRONOLACTONE 25 MG PO TABS: 12.5000 mg | ORAL_TABLET | Freq: Every day | ORAL | 3 refills | Status: DC

## 2018-09-04 NOTE — Patient Instructions (Signed)
Please continue all other medications as before, and refills have been done if requested.  Please have the pharmacy call with any other refills you may need.  Please continue your efforts at being more active, low cholesterol diet, and weight control.  You are otherwise up to date with prevention measures today.  Please keep your appointments with your specialists as you may have planned  Your glucometer and supplies were sent to the pharmacy except for the sliding scale Novolog and glucometer  Please return in 3 months, or sooner if needed

## 2018-09-04 NOTE — Assessment & Plan Note (Signed)
stable overall by history and exam, recent data reviewed with pt, and pt to continue medical treatment as before,  to f/u any worsening symptoms or concerns  

## 2018-09-04 NOTE — Assessment & Plan Note (Signed)
With new cirrhosis, also to f/u Gi as planned

## 2018-09-04 NOTE — Assessment & Plan Note (Signed)
For insulin refill rx as supply about out, glucometer and supplies, cbg today reviewed, to f/u endo as planned, cont diet and wt control

## 2018-09-04 NOTE — Progress Notes (Signed)
Subjective:    Patient ID: Gloria [name], female    DOB: 1951/06/14, 68 y.o.   MRN: [id number]  HPI  Here to f/u recent hospn with [facility] presenting with glc > 600, marked low volume and recurrent falls and weakness.  Tx with IVF and insulin, then d/c to rehab, where CBG's in the 100's at rehab per pt.  Wt Readings from Last 3 Encounters:  09/04/18 161 lb (73 kg)  08/23/18 164 lb 10.9 oz (74.7 kg)  07/22/18 165 lb (74.8 kg)  Home from rehab since tues mar 3 with some small supply insulin, but needs refill and gluicometer and supplies.  Has not been able to check her sugars x 3 days, asks for cbg today. Pt denies chest pain, increased sob or doe, wheezing, orthopnea, PND, increased LE swelling, palpitations, dizziness or syncope.  Pt denies new neurological symptoms such as new headache, or facial or extremity weakness or numbness   Pt denies polydipsia, polyuria Past Medical History:  Diagnosis Date  . Allergic rhinitis, cause unspecified 03/30/2013  . Anxiety state, unspecified   . Arthritis    "knees" (05/01/2018)  . Asthma    hx (05/01/2018)  . CHF (congestive heart failure) ([place])   . Chronic bronchitis   . Depressive disorder, not elsewhere classified   . Difficulty waking    hard to wake up past sedation!  . Migraine, unspecified, without mention of intractable migraine without mention of status migrainosus   . OSA (obstructive sleep apnea) 12/25/2016  . Other and unspecified hyperlipidemia   . Panic attacks   . Postmenopausal symptoms   . Type 2 diabetes, diet controlled ([place])   . Unspecified essential hypertension   . Unspecified urinary incontinence   . UTI (lower urinary tract infection)    on 02/05/16/on meds   Past Surgical History:  Procedure Laterality Date  . ABDOMINAL HYSTERECTOMY  2002  . DIAGNOSTIC LAPAROSCOPY    . IR PARACENTESIS  05/08/2018  . OPEN REDUCTION SHOULDER DISLOCATION Right   . RIGHT/LEFT HEART CATH AND CORONARY ANGIOGRAPHY N/A 05/05/2018   Procedure:  RIGHT/LEFT HEART CATH AND CORONARY ANGIOGRAPHY;  Surgeon: Belva Crome, MD;  Location: [facility];  Service: Cardiovascular;  Laterality: N/A;  . TUBAL LIGATION    . TUMOR EXCISION Right    "ankle"    reports that she has never smoked. She has never used smokeless tobacco. She reports that she does not drink alcohol or use drugs. family history includes Arthritis in an other family member; Asthma in her son; Colon cancer in her maternal grandfather; Diabetes in her maternal grandmother, maternal uncle, and paternal uncle; Hypertension in her mother and another family member; Rheum arthritis in her mother. Allergies  Allergen Reactions  . Compazine Other (See Comments)    Stroke-like symptoms  . Glipizide Shortness Of Breath and Other (See Comments)    Dyspnea   . Haloperidol Decanoate Other (See Comments)    Extrapyramidal syndrome  . Penicillins Nausea And Vomiting    Did it involve swelling of the face/tongue/throat, SOB, or low BP? No, N and V Did it involve sudden or severe rash/hives, skin peeling, or any reaction on the inside of your mouth or nose? No Did you need to seek medical attention at a hospital or doctor's office? No When did it last happen? "a long time ago" If all above answers are "NO", may proceed with cephalosporin use.   . Metformin And Related Nausea Only and Other (See Comments)  GI upset  . Codeine Itching  . Lisinopril Cough   Current Outpatient Medications on File Prior to Visit  Medication Sig Dispense Refill  . camphor-menthol (SARNA) lotion Apply 1 application topically 2 (two) times daily. 222 mL 0  . Insulin Syringe-Needle U-100 25G X 1" 1 ML MISC For 4 times a day insulin SQ, 1 month supply. Diagnosis E11.65 30 each 0   No current facility-administered medications on file prior to visit.    Review of Systems  Constitutional: Negative for other unusual diaphoresis or sweats HENT: Negative for ear discharge or swelling Eyes: Negative  for other worsening visual disturbances Respiratory: Negative for stridor or other swelling  Gastrointestinal: Negative for worsening distension or other blood Genitourinary: Negative for retention or other urinary change Musculoskeletal: Negative for other MSK pain or swelling Skin: Negative for color change or other new lesions Neurological: Negative for worsening tremors and other numbness  Psychiatric/Behavioral: Negative for worsening agitation or other fatigue All other system neg per pt    Objective:   Physical Exam BP (!) 104/52   Pulse 60   Temp 97.9 F (36.6 C) (Oral)   Ht 5\' 7"  (1.702 m)   Wt 161 lb (73 kg)   SpO2 99%   BMI 25.22 kg/m  VS noted,  Constitutional: Pt appears in NAD HENT: Head: NCAT.  Right Ear: External ear normal.  Left Ear: External ear normal.  Eyes: . Pupils are equal, round, and reactive to light. Conjunctivae and EOM are normal Nose: without d/c or deformity Neck: Neck supple. Gross normal ROM Cardiovascular: Normal rate and regular rhythm.   Pulmonary/Chest: Effort normal and breath sounds without rales or wheezing.  Abd:  Soft, NT, ND, + BS, no organomegaly Neurological: Pt is alert. At baseline orientation, motor grossly intact Skin: Skin is warm. No rashes, other new lesions, no LE edema Psychiatric: Pt behavior is normal without agitation  No other exam findings  Lab Results  Component Value Date   WBC 7.7 08/21/2018   HGB 10.0 (L) 08/21/2018   HCT 30.5 (L) 08/21/2018   PLT 204 08/21/2018   GLUCOSE 129 (H) 08/19/2018   CHOL 156 07/19/2018   TRIG 187 (H) 07/19/2018   HDL 39 (L) 07/19/2018   LDLDIRECT 64.0 10/03/2015   LDLCALC 80 07/19/2018   ALT 350 (H) 08/24/2018   AST 305 (H) 08/24/2018   NA 139 08/19/2018   K 3.8 08/19/2018   CL 107 08/19/2018   CREATININE 0.99 08/19/2018   BUN 18 08/19/2018   CO2 21 (L) 08/19/2018   TSH 4.24 02/25/2018   INR 1.02 08/18/2018   HGBA1C 10.0 (H) 07/18/2018   MICROALBUR 6.4 (H) 02/25/2018     CBG  POCT - 82    Assessment & Plan:

## 2018-09-04 NOTE — Assessment & Plan Note (Signed)
Lab Results  Component Value Date   LDLCALC 80 07/19/2018  stable overall by history and exam, recent data reviewed with pt, and pt to continue medical treatment as before,  to f/u any worsening symptoms or concerns

## 2018-09-07 ENCOUNTER — Inpatient Hospital Stay: Payer: Medicare Other | Admitting: Internal Medicine

## 2018-09-07 ENCOUNTER — Other Ambulatory Visit: Payer: Self-pay | Admitting: Internal Medicine

## 2018-09-07 MED ORDER — BLOOD GLUCOSE MONITOR KIT: PACK | 1 refills | Status: DC

## 2018-09-07 NOTE — Telephone Encounter (Signed)
Copied from [place] [id number]. Topic: Quick Communication - Rx Refill/Question >> Sep 07, 2018 11:47 AM Reyne Dumas L wrote: Medication: blood glucose meter kit and supplies KIT  Has the patient contacted their pharmacy? Yes - states she must have a script, she thought it was being sent on Friday, but they don't have anything (Agent: If no, request that the patient contact the pharmacy for the refill.) (Agent: If yes, when and what did the pharmacy advise?)  Preferred Pharmacy (with phone number or street name): [facility] #[id number] - [place], [place] [phone] (Phone) [phone] (Fax)  Agent: Please be advised that RX refills may take up to 3 business days. We ask that you follow-up with your pharmacy.

## 2018-09-07 NOTE — Telephone Encounter (Signed)
Requested medication (s) are due for refill today: Yes  Requested medication (s) are on the active medication list: Yes  Last refill:  3 days ago (Pharmacy did not receive)  Future visit scheduled: Yes  Notes to clinic:  Please resend to pharmacy, unable to electronically send, pharmacy did not receive     Requested Prescriptions  Pending Prescriptions Disp Refills   blood glucose meter kit and supplies KIT 1 each 1    Sig: Use as directed up to four times daily as directed.E11.9 For QAC - HS accuchecks.     Endocrinology: Diabetes - Testing Supplies Passed - 09/07/2018 11:55 AM      Passed - Valid encounter within last 12 months    Recent Outpatient Visits          3 days ago Uncontrolled type 2 diabetes mellitus with diabetic neuropathy, without long-term current use of insulin ([place])   [facility] -Georges Mouse, MD   1 month ago Uncontrolled type 2 diabetes mellitus with diabetic neuropathy, without long-term current use of insulin [facility])   [facility] -Georges Mouse, MD   3 months ago Uncontrolled type 2 diabetes mellitus with diabetic neuropathy, without long-term current use of insulin [facility])   [place] John, James W, MD   6 months ago Abdominal swelling, generalized   [place] John, James W, MD   7 months ago Encounter for preventative adult health care exam with abnormal findings   [place], James W, MD      Future Appointments            In 2 months Jenny Reichmann, Hunt Oris, MD [place]

## 2018-09-11 ENCOUNTER — Ambulatory Visit: Payer: Medicare Other | Admitting: Physician Assistant

## 2018-09-16 ENCOUNTER — Telehealth: Payer: Self-pay | Admitting: Internal Medicine

## 2018-09-16 ENCOUNTER — Other Ambulatory Visit (HOSPITAL_COMMUNITY): Payer: Self-pay | Admitting: *Deleted

## 2018-09-16 MED ORDER — SPIRONOLACTONE 25 MG PO TABS: 12.5000 mg | ORAL_TABLET | Freq: Every day | ORAL | 3 refills | Status: DC

## 2018-09-16 MED ORDER — CARVEDILOL 3.125 MG PO TABS: 3.1250 mg | ORAL_TABLET | Freq: Two times a day (BID) | ORAL | 11 refills | Status: DC

## 2018-09-16 MED ORDER — TORSEMIDE 20 MG PO TABS: 20.0000 mg | ORAL_TABLET | Freq: Every day | ORAL | 5 refills | Status: DC | PRN

## 2018-09-16 NOTE — Telephone Encounter (Signed)
Called pt, LVM.   

## 2018-09-16 NOTE — Telephone Encounter (Signed)
entresto refills normally come per cardiology  I would not feel comfortable with increase of xanax as this is already a higher strength

## 2018-09-16 NOTE — Telephone Encounter (Signed)
Copied from [place] [id number]. Topic: Quick Communication - Rx Refill/Question >> Sep 16, 2018  1:42 PM [place], [place] D wrote: Medication: Pt is requesting refill on Entresto. She stated it was prescribed while she was in the hospital. Not on her current medication list. Please advise.  Pt also stated her ALPRAZolam Duanne Moron) 1 MG tablet is not working and would like to speak with Dr. Jenny Reichmann or his assistant about this. OV scheduled for 09/17/18.  Has the patient contacted their pharmacy? Yes.   (Agent: If no, request that the patient contact the pharmacy for the refill.) (Agent: If yes, when and what did the pharmacy advise?)  Preferred Pharmacy (with phone number or street name): [facility] #[id number] - [place], [place] [phone] (Phone) [phone] (Fax)   Agent: Please be advised that RX refills may take up to 3 business days. We ask that you follow-up with your pharmacy.

## 2018-09-17 ENCOUNTER — Ambulatory Visit (INDEPENDENT_AMBULATORY_CARE_PROVIDER_SITE_OTHER): Payer: Medicare Other | Admitting: Internal Medicine

## 2018-09-17 ENCOUNTER — Encounter: Payer: Self-pay | Admitting: Internal Medicine

## 2018-09-17 ENCOUNTER — Other Ambulatory Visit: Payer: Self-pay

## 2018-09-17 VITALS — BP 138/90 | HR 105 | Temp 98.1°F | Ht 67.0 in | Wt 172.0 lb

## 2018-09-17 DIAGNOSIS — E1100 Type 2 diabetes mellitus with hyperosmolarity without nonketotic hyperglycemic-hyperosmolar coma (NKHHC): Secondary | ICD-10-CM | POA: Diagnosis not present

## 2018-09-17 DIAGNOSIS — I10 Essential (primary) hypertension: Secondary | ICD-10-CM | POA: Diagnosis not present

## 2018-09-17 DIAGNOSIS — G4700 Insomnia, unspecified: Secondary | ICD-10-CM

## 2018-09-17 MED ORDER — LANCETS MISC: 12 refills | Status: DC

## 2018-09-17 MED ORDER — ONETOUCH ULTRA 2 W/DEVICE KIT: PACK | 0 refills | Status: DC

## 2018-09-17 MED ORDER — TEMAZEPAM 15 MG PO CAPS: ORAL_CAPSULE | ORAL | 5 refills | Status: DC

## 2018-09-17 MED ORDER — SPIRONOLACTONE 25 MG PO TABS: 12.5000 mg | ORAL_TABLET | Freq: Every day | ORAL | 3 refills | Status: DC

## 2018-09-17 MED ORDER — GLUCOSE BLOOD VI STRP: ORAL_STRIP | 12 refills | Status: DC

## 2018-09-17 MED ORDER — INSULIN ASPART 100 UNIT/ML ~~LOC~~ SOLN: 0.0000 [IU] | Freq: Three times a day (TID) | SUBCUTANEOUS | 5 refills | Status: DC

## 2018-09-17 NOTE — Assessment & Plan Note (Signed)
stable overall by history and exam, recent data reviewed with pt, and pt to continue medical treatment as before,  to f/u any worsening symptoms or concerns  

## 2018-09-17 NOTE — Assessment & Plan Note (Signed)
Ok for tx with temazepam qhs prn,  to f/u any worsening symptoms or concerns

## 2018-09-17 NOTE — Assessment & Plan Note (Signed)
Mild uncontrolled, to restart the novolog asd,  to f/u any worsening symptoms or concerns

## 2018-09-17 NOTE — Progress Notes (Signed)
Subjective:    Patient ID: Gloria [name], female    DOB: 1951/01/01, 68 y.o.   MRN: [id number]  HPI  Here to f/u; overall doing ok,  Pt denies chest pain, increasing sob or doe, wheezing, orthopnea, PND, increased LE swelling, palpitations, dizziness or syncope.  Pt denies new neurological symptoms such as new headache, or facial or extremity weakness or numbness.  Pt denies polydipsia, polyuria, or low sugar episode.  Pt states overall good compliance with meds, mostly trying to follow appropriate diet, with wt overall stable,  Needs new glucometer and supplies. CBG 260 this am. Has not been able to take the novolog bc her current pen simply does not work, needs replacement   Having nightly difficulty with getting to sleep for several months with increased stressors.   Pt denies fever, wt loss, night sweats, loss of appetite, or other constitutional symptoms Past Medical History:  Diagnosis Date  . Allergic rhinitis, cause unspecified 03/30/2013  . Anxiety state, unspecified   . Arthritis    "knees" (05/01/2018)  . Asthma    hx (05/01/2018)  . CHF (congestive heart failure) ([place])   . Chronic bronchitis   . Depressive disorder, not elsewhere classified   . Difficulty waking    hard to wake up past sedation!  . Migraine, unspecified, without mention of intractable migraine without mention of status migrainosus   . OSA (obstructive sleep apnea) 12/25/2016  . Other and unspecified hyperlipidemia   . Panic attacks   . Postmenopausal symptoms   . Type 2 diabetes, diet controlled ([place])   . Unspecified essential hypertension   . Unspecified urinary incontinence   . UTI (lower urinary tract infection)    on 02/05/16/on meds   Past Surgical History:  Procedure Laterality Date  . ABDOMINAL HYSTERECTOMY  2002  . DIAGNOSTIC LAPAROSCOPY    . IR PARACENTESIS  05/08/2018  . OPEN REDUCTION SHOULDER DISLOCATION Right   . RIGHT/LEFT HEART CATH AND CORONARY ANGIOGRAPHY N/A 05/05/2018   Procedure:  RIGHT/LEFT HEART CATH AND CORONARY ANGIOGRAPHY;  Surgeon: Belva Crome, MD;  Location: [facility];  Service: Cardiovascular;  Laterality: N/A;  . TUBAL LIGATION    . TUMOR EXCISION Right    "ankle"    reports that she has never smoked. She has never used smokeless tobacco. She reports that she does not drink alcohol or use drugs. family history includes Arthritis in an other family member; Asthma in her son; Colon cancer in her maternal grandfather; Diabetes in her maternal grandmother, maternal uncle, and paternal uncle; Hypertension in her mother and another family member; Rheum arthritis in her mother. Allergies  Allergen Reactions  . Compazine Other (See Comments)    Stroke-like symptoms  . Glipizide Shortness Of Breath and Other (See Comments)    Dyspnea   . Haloperidol Decanoate Other (See Comments)    Extrapyramidal syndrome  . Penicillins Nausea And Vomiting    Did it involve swelling of the face/tongue/throat, SOB, or low BP? No, N and V Did it involve sudden or severe rash/hives, skin peeling, or any reaction on the inside of your mouth or nose? No Did you need to seek medical attention at a hospital or doctor's office? No When did it last happen? "a long time ago" If all above answers are "NO", may proceed with cephalosporin use.   . Metformin And Related Nausea Only and Other (See Comments)    GI upset  . Codeine Itching  . Lisinopril Cough   Current  Outpatient Medications on File Prior to Visit  Medication Sig Dispense Refill  . blood glucose meter kit and supplies KIT Use as directed up to four times daily as directed.E11.9 For QAC - HS accuchecks. 1 each 1  . camphor-menthol (SARNA) lotion Apply 1 application topically 2 (two) times daily. 222 mL 0  . carvedilol (COREG) 3.125 MG tablet Take 1 tablet (3.125 mg total) by mouth 2 (two) times daily with a meal. 60 tablet 11  . insulin glargine (LANTUS) 100 UNIT/ML injection Inject 0.22 mLs (22 Units total) into  the skin at bedtime. 10 mL 5  . Insulin Pen Needle (BD PEN NEEDLE NANO U/F) 32G X 4 MM MISC USE ONCE DAILY E11.9 100 each 3  . Insulin Syringe-Needle U-100 25G X 1" 1 ML MISC For 4 times a day insulin SQ, 1 month supply. Diagnosis E11.65 30 each 0  . losartan (COZAAR) 25 MG tablet Take 1 tablet (25 mg total) by mouth 2 (two) times daily. 60 tablet 11  . torsemide (DEMADEX) 20 MG tablet Take 1 tablet (20 mg total) by mouth daily as needed (Leg edema, dyspnea, weight gain of greater than 3 pounds per day or 5 pounds in a week.). 30 tablet 5   No current facility-administered medications on file prior to visit.    Review of Systems  Constitutional: Negative for other unusual diaphoresis or sweats HENT: Negative for ear discharge or swelling Eyes: Negative for other worsening visual disturbances Respiratory: Negative for stridor or other swelling  Gastrointestinal: Negative for worsening distension or other blood Genitourinary: Negative for retention or other urinary change Musculoskeletal: Negative for other MSK pain or swelling Skin: Negative for color change or other new lesions Neurological: Negative for worsening tremors and other numbness  Psychiatric/Behavioral: Negative for worsening agitation or other fatigue All other system neg per pt    Objective:   Physical Exam BP 138/90   Pulse (!) 105   Temp 98.1 F (36.7 C) (Oral)   Ht '5\' 7"'  (1.702 m)   Wt 172 lb (78 kg)   SpO2 98%   BMI 26.94 kg/m  VS noted,  Constitutional: Pt appears in NAD HENT: Head: NCAT.  Right Ear: External ear normal.  Left Ear: External ear normal.  Eyes: . Pupils are equal, round, and reactive to light. Conjunctivae and EOM are normal Nose: without d/c or deformity Neck: Neck supple. Gross normal ROM Cardiovascular: Normal rate and regular rhythm.   Pulmonary/Chest: Effort normal and breath sounds without rales or wheezing.  Abd:  Soft, NT, ND, + BS, no organomegaly Neurological: Pt is alert. At  baseline orientation, motor grossly intact Skin: Skin is warm. No rashes, other new lesions, no LE edema Psychiatric: Pt behavior is normal without agitation  No other exam findings  Lab Results  Component Value Date   WBC 7.7 08/21/2018   HGB 10.0 (L) 08/21/2018   HCT 30.5 (L) 08/21/2018   PLT 204 08/21/2018   GLUCOSE 129 (H) 08/19/2018   CHOL 156 07/19/2018   TRIG 187 (H) 07/19/2018   HDL 39 (L) 07/19/2018   LDLDIRECT 64.0 10/03/2015   LDLCALC 80 07/19/2018   ALT 350 (H) 08/24/2018   AST 305 (H) 08/24/2018   NA 139 08/19/2018   K 3.8 08/19/2018   CL 107 08/19/2018   CREATININE 0.99 08/19/2018   BUN 18 08/19/2018   CO2 21 (L) 08/19/2018   TSH 4.24 02/25/2018   INR 1.02 08/18/2018   HGBA1C 10.0 (H) 07/18/2018   MICROALBUR  6.4 (H) 02/25/2018      Assessment & Plan:

## 2018-09-17 NOTE — Patient Instructions (Signed)
Please take all new medication as prescribed - the temazepam  OK to stop the xanax  Please continue all other medications as before, and refills have been done if requested - the novolog and aldactone  Your meter and supplies were sent to the pharmacy as well  Please have the pharmacy call with any other refills you may need.  Please continue your efforts at being more active, low cholesterol diet, and weight control.  Please keep your appointments with your specialists as you may have planned

## 2018-09-18 ENCOUNTER — Ambulatory Visit (HOSPITAL_COMMUNITY): Payer: No Typology Code available for payment source

## 2018-09-18 ENCOUNTER — Encounter (HOSPITAL_COMMUNITY): Payer: Medicare Other | Admitting: Cardiology

## 2018-09-18 ENCOUNTER — Telehealth: Payer: Self-pay | Admitting: Internal Medicine

## 2018-09-18 MED ORDER — BLOOD GLUCOSE METER KIT: PACK | 0 refills | Status: DC

## 2018-09-18 NOTE — Telephone Encounter (Signed)
Copied from [place] [id number]. Topic: Quick Communication - Rx Refill/Question >> Sep 18, 2018  1:49 PM Gustavus Messing wrote: Medication: temazepam (RESTORIL) 15 MG capsule  Lancets MISC [[id number]]  Acccheck machine and strips that go with it.  Has the patient contacted their pharmacy? Yes.   (Agent: If yes, when and what did the pharmacy advise?) The patient's insurance would not cover any of these prescriptions. She needs the accucheck instead of the one touch and she needs the generic of these medications that her insurance will cover. She needs all of these for her diabetes  Preferred Pharmacy (with phone number or street name): [facility] [place], [place] [phone] (Phone) [phone] (Fax)    Agent: Please be advised that RX refills may take up to 3 business days. We ask that you follow-up with your pharmacy.

## 2018-09-18 NOTE — Telephone Encounter (Addendum)
Pt called back in to follow up on previous message sent. Pt says that she need to have her medication, Accucheck machine and test strips for tonight to check her blood sugar and to sleep. Pt says that her insurance isn't covering current Rx's. Pt says that insurance is requesting to have a medication change or asking that the office contact them for PA.   Pt would like a call back today to be advised.    Please assist.

## 2018-09-18 NOTE — Addendum Note (Signed)
Addended by: Juliet Rude on: 09/18/2018 04:48 PM   Modules accepted: Orders

## 2018-09-18 NOTE — Telephone Encounter (Signed)
Glucose meter kit has been corrected and faxed.   Dr. Jenny Reichmann please send in alternative. I was on hold for 46mns and couldn't get to a pharmacist.

## 2018-09-19 MED ORDER — FREESTYLE SYSTEM KIT: PACK | 0 refills | Status: DC

## 2018-09-19 MED ORDER — LANCETS MISC: 3 refills | Status: AC

## 2018-09-19 MED ORDER — GLUCOSE BLOOD VI STRP: ORAL_STRIP | 12 refills | Status: DC

## 2018-09-19 NOTE — Telephone Encounter (Signed)
I did rx for freestyle glucometer but also ok for change to any brand as per insurance  Resent rx for lancet and strips

## 2018-09-19 NOTE — Addendum Note (Signed)
Addended by: Biagio Borg on: 09/19/2018 05:29 PM   Modules accepted: Orders

## 2018-09-28 ENCOUNTER — Telehealth: Payer: Self-pay | Admitting: Internal Medicine

## 2018-09-28 NOTE — Telephone Encounter (Signed)
Copied from [place] [id number]. Topic: Quick Communication - Rx Refill/Question >> Sep 28, 2018  4:23 PM Alanda Slim E wrote: Medication: temazepam (RESTORIL) 15 MG capsule  is not working and Pt is asking for a prescription for Ambien to sleep/ please advise

## 2018-09-28 NOTE — Telephone Encounter (Signed)
Patient is requesting a refill on Ambien.  Thanks

## 2018-09-29 MED ORDER — ZOLPIDEM TARTRATE 5 MG PO TABS: 5.0000 mg | ORAL_TABLET | Freq: Every evening | ORAL | 5 refills | Status: DC | PRN

## 2018-09-29 NOTE — Telephone Encounter (Signed)
Done erx 

## 2018-09-29 NOTE — Telephone Encounter (Signed)
Routing to dr john, please advise, thanks 

## 2018-09-29 NOTE — Telephone Encounter (Signed)
Patient advised new rx has been sent to [facility]

## 2018-10-08 ENCOUNTER — Telehealth: Payer: Self-pay | Admitting: Internal Medicine

## 2018-10-08 NOTE — Telephone Encounter (Signed)
Copied from [place] [id number]. Topic: Quick Communication - Rx Refill/Question >> Oct 08, 2018  4:25 PM Alanda Slim E wrote: Medication: zolpidem (AMBIEN) 5 MG tablet - Pt states that this medication is not working and she wants to know if Dr. Jenny Reichmann has an alternative that will work better/ please advise   Has the patient contacted their pharmacy?   Preferred Pharmacy (with phone number or street name): [id number] - [place], [place] [phone] (Phone) [phone] (Fax)    Agent: Please be advised that RX refills may take up to 3 business days. We ask that you follow-up with your pharmacy.

## 2018-10-12 MED ORDER — TRAZODONE HCL 50 MG PO TABS: 25.0000 mg | ORAL_TABLET | Freq: Every evening | ORAL | 1 refills | Status: DC | PRN

## 2018-10-12 NOTE — Addendum Note (Signed)
Addended by: Biagio Borg on: 10/12/2018 02:06 PM   Modules accepted: Orders

## 2018-10-12 NOTE — Telephone Encounter (Signed)
Pt has been informed.

## 2018-10-12 NOTE — Telephone Encounter (Signed)
Ok to try 2 of the pills tonight and let us know if still does not work well, thanks

## 2018-10-12 NOTE — Telephone Encounter (Signed)
[place] for change to trazodone prn - done  erx

## 2018-10-12 NOTE — Telephone Encounter (Signed)
I have spoken to the pt. She stated for the past 3-4 nights she has double up on the zolpidem pills and it still is not helping her. Please advise.

## 2018-10-13 ENCOUNTER — Telehealth: Payer: Self-pay | Admitting: Internal Medicine

## 2018-10-13 NOTE — Telephone Encounter (Signed)
Copied from [place] [id number]. Topic: Quick Communication - Rx Refill/Question >> Oct 13, 2018 11:51 AM Robina Ade, Helene Kelp D wrote: Medication: traZODone (DESYREL) 50 MG tablet  Has the patient contacted their pharmacy? Yes, but insurance wont cover the medication and would like something else. (Agent: If no, request that the patient contact the pharmacy for the refill.) (Agent: If yes, when and what did the pharmacy advise?)  Preferred Pharmacy (with phone number or street name): [id number] - [place], [place]: Please be advised that RX refills may take up to 3 business days. We ask that you follow-up with your pharmacy.

## 2018-10-13 NOTE — Telephone Encounter (Signed)
Pt has been informed to check with her insurance company to see what is covered.

## 2018-10-22 ENCOUNTER — Telehealth: Payer: Self-pay | Admitting: Internal Medicine

## 2018-10-22 MED ORDER — INSULIN LISPRO 100 UNIT/ML CARTRIDGE: SUBCUTANEOUS | 11 refills | Status: DC

## 2018-10-22 NOTE — Telephone Encounter (Signed)
rx done hardcopy to [facility] to fax due to the length of the rx

## 2018-10-22 NOTE — Telephone Encounter (Signed)
Script has been faxed

## 2018-10-22 NOTE — Telephone Encounter (Signed)
Novolog is not covered on patients insurance, and Humalog is. Please advise pharmacy on if switching this is okay. If not okay then a prior authorization will be neccessary

## 2018-10-22 NOTE — Telephone Encounter (Signed)
Pt has already been informed on 4/14 to check with insurance company for medications that are covered since neither the zolpidem or trazodone has helped give her any relief.

## 2018-10-22 NOTE — Telephone Encounter (Signed)
Copied from [place] [id number]. Topic: General - Other >> Oct 22, 2018 12:56 PM Pauline Good wrote: Reason for CRM: pt hasn't been sleeping well and need something for sleep. Please advise pt

## 2018-10-22 NOTE — Telephone Encounter (Signed)
Pt informed of response again below and she will call back with meds covered

## 2018-10-22 NOTE — Telephone Encounter (Signed)
As we are not allowed to know the cost of anything we do in healthcare, and I cannot know her particular insurance medication coverage on this day,    Please advise pt to ask at pharmacy what other similar insulin is covered  Options include Humalog (short acting) or Apidra.  I decline to make any changes bc at this time I would only be guessing

## 2018-11-05 ENCOUNTER — Other Ambulatory Visit: Payer: Self-pay

## 2018-11-05 ENCOUNTER — Ambulatory Visit (INDEPENDENT_AMBULATORY_CARE_PROVIDER_SITE_OTHER): Payer: Medicare Other | Admitting: Internal Medicine

## 2018-11-05 ENCOUNTER — Encounter: Payer: Self-pay | Admitting: Internal Medicine

## 2018-11-05 DIAGNOSIS — E1165 Type 2 diabetes mellitus with hyperglycemia: Secondary | ICD-10-CM

## 2018-11-05 DIAGNOSIS — G4700 Insomnia, unspecified: Secondary | ICD-10-CM | POA: Diagnosis not present

## 2018-11-05 DIAGNOSIS — F411 Generalized anxiety disorder: Secondary | ICD-10-CM | POA: Diagnosis not present

## 2018-11-05 DIAGNOSIS — IMO0002 Reserved for concepts with insufficient information to code with codable children: Secondary | ICD-10-CM

## 2018-11-05 DIAGNOSIS — E1140 Type 2 diabetes mellitus with diabetic neuropathy, unspecified: Secondary | ICD-10-CM | POA: Diagnosis not present

## 2018-11-05 MED ORDER — "INSULIN SYRINGE-NEEDLE U-100 25G X 1"" 1 ML MISC": 0 refills | Status: AC

## 2018-11-05 MED ORDER — INSULIN PEN NEEDLE 32G X 4 MM MISC: 3 refills | Status: DC

## 2018-11-05 MED ORDER — INSULIN LISPRO 100 UNIT/ML CARTRIDGE: SUBCUTANEOUS | 11 refills | Status: DC

## 2018-11-05 MED ORDER — DIAZEPAM 10 MG PO TABS: 10.0000 mg | ORAL_TABLET | Freq: Every evening | ORAL | 2 refills | Status: DC | PRN

## 2018-11-05 MED ORDER — FREESTYLE SYSTEM KIT: PACK | 0 refills | Status: AC

## 2018-11-05 MED ORDER — INSULIN GLARGINE 100 UNIT/ML ~~LOC~~ SOLN: 22.0000 [IU] | Freq: Every day | SUBCUTANEOUS | 5 refills | Status: DC

## 2018-11-05 MED ORDER — GLUCOSE BLOOD VI STRP: ORAL_STRIP | 12 refills | Status: AC

## 2018-11-05 NOTE — Assessment & Plan Note (Signed)
Persistent situational worsening with husbands recent deathl for valium 10 mg qhs prn, consider seroquel,  to f/u any worsening symptoms or concerns

## 2018-11-05 NOTE — Progress Notes (Signed)
Patient ID: Gloria [name], female   DOB: [birth year], 68 y.o.   MRN: [id number]  Virtual Visit via Video Note  I connected with Gloria [name] on 11/05/18 at  7:00 PM EDT by a video enabled telemedicine application and verified that I am speaking with the correct person using two identifiers.  Location: Patient: at home, no others present Provider: at office   I discussed the limitations of evaluation and management by telemedicine and the availability of in person appointments. The patient expressed understanding and agreed to proceed.  History of Present Illness: Here to f/u; overall doing ok,  Pt denies chest pain, increasing sob or doe, wheezing, orthopnea, PND, increased LE swelling, palpitations, dizziness or syncope.  Pt denies new neurological symptoms such as new headache, or facial or extremity weakness or numbness.  Pt denies polydipsia, polyuria, or low sugar episode.  Pt states overall good compliance with meds, including trials of resotril, trazodone, and ambien but none working well for sleep and is getting desparate, as her husband recently died from sarcoidosis and she has been markely upset.  Has some counseling available with hospice  CBGs have been about 138 in the am.  Past Medical History:  Diagnosis Date  . Allergic rhinitis, cause unspecified 03/30/2013  . Anxiety state, unspecified   . Arthritis    "knees" (05/01/2018)  . Asthma    hx (05/01/2018)  . CHF (congestive heart failure) ([place])   . Chronic bronchitis   . Depressive disorder, not elsewhere classified   . Difficulty waking    hard to wake up past sedation!  . Migraine, unspecified, without mention of intractable migraine without mention of status migrainosus   . OSA (obstructive sleep apnea) 12/25/2016  . Other and unspecified hyperlipidemia   . Panic attacks   . Postmenopausal symptoms   . Type 2 diabetes, diet controlled ([place])   . Unspecified essential hypertension   . Unspecified urinary incontinence   .  UTI (lower urinary tract infection)    on 02/05/16/on meds   Past Surgical History:  Procedure Laterality Date  . ABDOMINAL HYSTERECTOMY  2002  . DIAGNOSTIC LAPAROSCOPY    . IR PARACENTESIS  05/08/2018  . OPEN REDUCTION SHOULDER DISLOCATION Right   . RIGHT/LEFT HEART CATH AND CORONARY ANGIOGRAPHY N/A 05/05/2018   Procedure: RIGHT/LEFT HEART CATH AND CORONARY ANGIOGRAPHY;  Surgeon: Belva Crome, MD;  Location: [facility];  Service: Cardiovascular;  Laterality: N/A;  . TUBAL LIGATION    . TUMOR EXCISION Right    "ankle"    reports that she has never smoked. She has never used smokeless tobacco. She reports that she does not drink alcohol or use drugs. family history includes Arthritis in an other family member; Asthma in her son; Colon cancer in her maternal grandfather; Diabetes in her maternal grandmother, maternal uncle, and paternal uncle; Hypertension in her mother and another family member; Rheum arthritis in her mother. Allergies  Allergen Reactions  . Compazine Other (See Comments)    Stroke-like symptoms  . Glipizide Shortness Of Breath and Other (See Comments)    Dyspnea   . Haloperidol Decanoate Other (See Comments)    Extrapyramidal syndrome  . Penicillins Nausea And Vomiting    Did it involve swelling of the face/tongue/throat, SOB, or low BP? No, N and V Did it involve sudden or severe rash/hives, skin peeling, or any reaction on the inside of your mouth or nose? No Did you need to seek medical attention at a hospital or  doctor's office? No When did it last happen? "a long time ago" If all above answers are "NO", may proceed with cephalosporin use.   . Metformin And Related Nausea Only and Other (See Comments)    GI upset  . Codeine Itching  . Lisinopril Cough   Current Outpatient Medications on File Prior to Visit  Medication Sig Dispense Refill  . camphor-menthol (SARNA) lotion Apply 1 application topically 2 (two) times daily. 222 mL 0  . carvedilol  (COREG) 3.125 MG tablet Take 1 tablet (3.125 mg total) by mouth 2 (two) times daily with a meal. 60 tablet 11  . glucose blood test strip Use as instructed four times per day E11.9 400 each 12  . glucose monitoring kit (FREESTYLE) monitoring kit Use as directed E11.9 1 each 0  . insulin aspart (NOVOLOG) 100 UNIT/ML injection Inject 0-9 Units into the skin 3 (three) times daily with meals. CBG < 70: implement hypoglycemia protocol CBG 70 - 120: 0 units CBG 121 - 150: 1 unit CBG 151 - 200: 2 units CBG 201 - 250: 3 units CBG 251 - 300: 5 units CBG 301 - 350: 7 units CBG 351 - 400: 9 units CBG > 400: call MD. 10 mL 5  . insulin glargine (LANTUS) 100 UNIT/ML injection Inject 0.22 mLs (22 Units total) into the skin at bedtime. 10 mL 5  . insulin lispro (HUMALOG) 100 UNIT/ML cartridge CBG < 70: implement hypoglycemia protocol CBG 70 - 120: 0 units CBG 121 - 150: 1 unit CBG 151 - 200: 2 units CBG 201 - 250: 3 units CBG 251 - 300: 5 units CBG 301 - 350: 7 units CBG 351 - 400: 9 units CBG > 400: call MD. 15 mL 11  . Insulin Pen Needle (BD PEN NEEDLE NANO U/F) 32G X 4 MM MISC USE ONCE DAILY E11.9 100 each 3  . Insulin Syringe-Needle U-100 25G X 1" 1 ML MISC For 4 times a day insulin SQ, 1 month supply. Diagnosis E11.65 30 each 0  . Lancets MISC Use as directed four times per day E11.9 400 each 3  . losartan (COZAAR) 25 MG tablet Take 1 tablet (25 mg total) by mouth 2 (two) times daily. 60 tablet 11  . spironolactone (ALDACTONE) 25 MG tablet Take 0.5 tablets (12.5 mg total) by mouth daily. 45 tablet 3  . temazepam (RESTORIL) 15 MG capsule 1-2 tab by mouth at bedtime as needed for sleep 60 capsule 5  . torsemide (DEMADEX) 20 MG tablet Take 1 tablet (20 mg total) by mouth daily as needed (Leg edema, dyspnea, weight gain of greater than 3 pounds per day or 5 pounds in a week.). 30 tablet 5  . traZODone (DESYREL) 50 MG tablet Take 0.5-1 tablets (25-50 mg total) by mouth at bedtime as needed for sleep.  90 tablet 1  . zolpidem (AMBIEN) 5 MG tablet Take 1 tablet (5 mg total) by mouth at bedtime as needed for sleep. 30 tablet 5   No current facility-administered medications on file prior to visit.     Observations/Objective: Alert, NAD, appropriate mood and affect, resps normal, cn 2-12 intact, moves all 4s, no visible rash or swelling Lab Results  Component Value Date   WBC 7.7 08/21/2018   HGB 10.0 (L) 08/21/2018   HCT 30.5 (L) 08/21/2018   PLT 204 08/21/2018   GLUCOSE 129 (H) 08/19/2018   CHOL 156 07/19/2018   TRIG 187 (H) 07/19/2018   HDL 39 (L) 07/19/2018   LDLDIRECT  64.0 10/03/2015   LDLCALC 80 07/19/2018   ALT 350 (H) 08/24/2018   AST 305 (H) 08/24/2018   NA 139 08/19/2018   K 3.8 08/19/2018   CL 107 08/19/2018   CREATININE 0.99 08/19/2018   BUN 18 08/19/2018   CO2 21 (L) 08/19/2018   TSH 4.24 02/25/2018   INR 1.02 08/18/2018   HGBA1C 10.0 (H) 07/18/2018   MICROALBUR 6.4 (H) 02/25/2018   Assessment and Plan: See notes  Follow Up Instructions: See notes   I discussed the assessment and treatment plan with the patient. The patient was provided an opportunity to ask questions and all were answered. The patient agreed with the plan and demonstrated an understanding of the instructions.   The patient was advised to call back or seek an in-person evaluation if the symptoms worsen or if the condition fails to improve as anticipated.  Cathlean Cower, MD

## 2018-11-05 NOTE — Assessment & Plan Note (Signed)
With grief reaction, f/u counseling as planned

## 2018-11-05 NOTE — Assessment & Plan Note (Signed)
stable overall by history and exam, recent data reviewed with pt, and pt to continue medical treatment as before,  to f/u any worsening symptoms or concerns, declines f/u a1c for now

## 2018-11-05 NOTE — Patient Instructions (Signed)
Please take all new medication as prescribed - the valium   Please continue all other medications as before, and refills have been done if requested.  Please have the pharmacy call with any other refills you may need.  Please continue your efforts at being more active, low cholesterol diabetic diet, and weight control.  Please keep your appointments with your specialists as you may have planned

## 2018-11-16 ENCOUNTER — Ambulatory Visit (INDEPENDENT_AMBULATORY_CARE_PROVIDER_SITE_OTHER): Payer: Medicare Other | Admitting: Internal Medicine

## 2018-11-16 ENCOUNTER — Encounter: Payer: Self-pay | Admitting: Internal Medicine

## 2018-11-16 DIAGNOSIS — G4700 Insomnia, unspecified: Secondary | ICD-10-CM | POA: Diagnosis not present

## 2018-11-16 DIAGNOSIS — F411 Generalized anxiety disorder: Secondary | ICD-10-CM

## 2018-11-16 DIAGNOSIS — E1165 Type 2 diabetes mellitus with hyperglycemia: Secondary | ICD-10-CM

## 2018-11-16 DIAGNOSIS — F1310 Sedative, hypnotic or anxiolytic abuse, uncomplicated: Secondary | ICD-10-CM

## 2018-11-16 DIAGNOSIS — E1140 Type 2 diabetes mellitus with diabetic neuropathy, unspecified: Secondary | ICD-10-CM | POA: Diagnosis not present

## 2018-11-16 DIAGNOSIS — IMO0002 Reserved for concepts with insufficient information to code with codable children: Secondary | ICD-10-CM

## 2018-11-16 MED ORDER — ZOLPIDEM TARTRATE 10 MG PO TABS: 10.0000 mg | ORAL_TABLET | Freq: Every evening | ORAL | 5 refills | Status: DC | PRN

## 2018-11-16 MED ORDER — MIRTAZAPINE 15 MG PO TABS: 15.0000 mg | ORAL_TABLET | Freq: Every day | ORAL | 3 refills | Status: DC

## 2018-11-16 MED ORDER — INSULIN PEN NEEDLE 32G X 4 MM MISC: 3 refills | Status: AC

## 2018-11-16 NOTE — Assessment & Plan Note (Signed)
With ongoing anxiety and sleep issue, ok for remeron 15 qhs, but also stressed that we cannot support overuse of other controlled substances, and unable to assist with early refills.  To take all medication only as prescribed

## 2018-11-16 NOTE — Assessment & Plan Note (Signed)
Consider seroquel or psychiatric referral if not improved

## 2018-11-16 NOTE — Patient Instructions (Addendum)
Please take all new medication as prescribed - the remeron 15 mg at bedtime  We can only authorize the safe use of the Valium at 10 mg at bedtime, and Ambien 10 mg at bedtime as well  Please continue all other medications as before, and refills have been done if requested.  Please have the pharmacy call with any other refills you may need.  Please continue your efforts at being more active, low cholesterol diet, and weight control.  Please keep your appointments with your specialists as you may have planned

## 2018-11-16 NOTE — Progress Notes (Signed)
Patient ID: Gloria [name], female   DOB: 09-17-1950, 68 y.o.   MRN: [id number]  Virtual Visit via Video Note  I connected with Gloria [name] on 11/16/18 at 11:20 AM EDT by a video enabled telemedicine application and verified that I am speaking with the correct person using two identifiers.  Location: Patient: at home Provider: at office   I discussed the limitations of evaluation and management by telemedicine and the availability of in person appointments. The patient expressed understanding and agreed to proceed.  History of Present Illness: 68yo F who presents with persistent difficulty with insomnia with just getting to sleep, admits to marked ongoing anxiety as well.  Admits to taking valium 20 mg nightly, now run out, asking for early refill and higher dosing than the 10 mg prescribed; has also taken ambien 15 mg last PM but even that did not seem to work overly well.  Denies memory issue or next day hangover.  Pt denies chest pain, increased sob or doe, wheezing, orthopnea, PND, increased LE swelling, palpitations, dizziness or syncope.   Pt denies polydipsia, polyuria Past Medical History:  Diagnosis Date  . Allergic rhinitis, cause unspecified 03/30/2013  . Anxiety state, unspecified   . Arthritis    "knees" (05/01/2018)  . Asthma    hx (05/01/2018)  . CHF (congestive heart failure) ([place])   . Chronic bronchitis   . Depressive disorder, not elsewhere classified   . Difficulty waking    hard to wake up past sedation!  . Migraine, unspecified, without mention of intractable migraine without mention of status migrainosus   . OSA (obstructive sleep apnea) 12/25/2016  . Other and unspecified hyperlipidemia   . Panic attacks   . Postmenopausal symptoms   . Type 2 diabetes, diet controlled ([place])   . Unspecified essential hypertension   . Unspecified urinary incontinence   . UTI (lower urinary tract infection)    on 02/05/16/on meds   Past Surgical History:  Procedure Laterality Date   . ABDOMINAL HYSTERECTOMY  2002  . DIAGNOSTIC LAPAROSCOPY    . IR PARACENTESIS  05/08/2018  . OPEN REDUCTION SHOULDER DISLOCATION Right   . RIGHT/LEFT HEART CATH AND CORONARY ANGIOGRAPHY N/A 05/05/2018   Procedure: RIGHT/LEFT HEART CATH AND CORONARY ANGIOGRAPHY;  Surgeon: Belva Crome, MD;  Location: [facility];  Service: Cardiovascular;  Laterality: N/A;  . TUBAL LIGATION    . TUMOR EXCISION Right    "ankle"    reports that she has never smoked. She has never used smokeless tobacco. She reports that she does not drink alcohol or use drugs. family history includes Arthritis in an other family member; Asthma in her son; Colon cancer in her maternal grandfather; Diabetes in her maternal grandmother, maternal uncle, and paternal uncle; Hypertension in her mother and another family member; Rheum arthritis in her mother. Allergies  Allergen Reactions  . Compazine Other (See Comments)    Stroke-like symptoms  . Glipizide Shortness Of Breath and Other (See Comments)    Dyspnea   . Haloperidol Decanoate Other (See Comments)    Extrapyramidal syndrome  . Penicillins Nausea And Vomiting    Did it involve swelling of the face/tongue/throat, SOB, or low BP? No, N and V Did it involve sudden or severe rash/hives, skin peeling, or any reaction on the inside of your mouth or nose? No Did you need to seek medical attention at a hospital or doctor's office? No When did it last happen? "a long time ago" If all above  answers are "NO", may proceed with cephalosporin use.   . Metformin And Related Nausea Only and Other (See Comments)    GI upset  . Codeine Itching  . Lisinopril Cough   Current Outpatient Medications on File Prior to Visit  Medication Sig Dispense Refill  . camphor-menthol (SARNA) lotion Apply 1 application topically 2 (two) times daily. 222 mL 0  . carvedilol (COREG) 3.125 MG tablet Take 1 tablet (3.125 mg total) by mouth 2 (two) times daily with a meal. 60 tablet 11  .  diazepam (VALIUM) 10 MG tablet Take 1 tablet (10 mg total) by mouth at bedtime as needed for anxiety. 30 tablet 2  . glucose blood test strip Use as instructed four times per day E11.9 400 each 12  . glucose monitoring kit (FREESTYLE) monitoring kit Use as directed E11.9 1 each 0  . insulin glargine (LANTUS) 100 UNIT/ML injection Inject 0.22 mLs (22 Units total) into the skin at bedtime. 10 mL 5  . insulin lispro (HUMALOG) 100 UNIT/ML cartridge CBG < 70: implement hypoglycemia protocol CBG 70 - 120: 0 units CBG 121 - 150: 1 unit CBG 151 - 200: 2 units CBG 201 - 250: 3 units CBG 251 - 300: 5 units CBG 301 - 350: 7 units CBG 351 - 400: 9 units CBG > 400: call MD. 15 mL 11  . Insulin Syringe-Needle U-100 25G X 1" 1 ML MISC For 4 times a day insulin SQ, 1 month supply. Diagnosis E11.65 30 each 0  . Lancets MISC Use as directed four times per day E11.9 400 each 3  . losartan (COZAAR) 25 MG tablet Take 1 tablet (25 mg total) by mouth 2 (two) times daily. 60 tablet 11  . spironolactone (ALDACTONE) 25 MG tablet Take 0.5 tablets (12.5 mg total) by mouth daily. 45 tablet 3  . torsemide (DEMADEX) 20 MG tablet Take 1 tablet (20 mg total) by mouth daily as needed (Leg edema, dyspnea, weight gain of greater than 3 pounds per day or 5 pounds in a week.). 30 tablet 5   No current facility-administered medications on file prior to visit.      Observations/Objective: Alert, NAD, appropriate mood and affect, resps normal, cn 2-12 intact, moves all 4s, no visible rash or swelling Lab Results  Component Value Date   WBC 7.7 08/21/2018   HGB 10.0 (L) 08/21/2018   HCT 30.5 (L) 08/21/2018   PLT 204 08/21/2018   GLUCOSE 129 (H) 08/19/2018   CHOL 156 07/19/2018   TRIG 187 (H) 07/19/2018   HDL 39 (L) 07/19/2018   LDLDIRECT 64.0 10/03/2015   LDLCALC 80 07/19/2018   ALT 350 (H) 08/24/2018   AST 305 (H) 08/24/2018   NA 139 08/19/2018   K 3.8 08/19/2018   CL 107 08/19/2018   CREATININE 0.99 08/19/2018    BUN 18 08/19/2018   CO2 21 (L) 08/19/2018   TSH 4.24 02/25/2018   INR 1.02 08/18/2018   HGBA1C 10.0 (H) 07/18/2018   MICROALBUR 6.4 (H) 02/25/2018   Assessment and Plan: See notes  Follow Up Instructions: See notes   I discussed the assessment and treatment plan with the patient. The patient was provided an opportunity to ask questions and all were answered. The patient agreed with the plan and demonstrated an understanding of the instructions.   The patient was advised to call back or seek an in-person evaluation if the symptoms worsen or if the condition fails to improve as anticipated.   Cathlean Cower, MD

## 2018-11-16 NOTE — Assessment & Plan Note (Signed)
stable overall by history and exam, recent data reviewed with pt, and pt to continue medical treatment as before,  to f/u any worsening symptoms or concerns  

## 2018-11-16 NOTE — Assessment & Plan Note (Signed)
D/w pt who has difficulty understanding and keeps asking for higher dosing; pt considered high risk, will need to monitor closely and no early refills

## 2018-11-25 ENCOUNTER — Ambulatory Visit: Payer: Medicare Other | Admitting: *Deleted

## 2018-11-25 ENCOUNTER — Telehealth: Payer: Self-pay | Admitting: Internal Medicine

## 2018-11-25 MED ORDER — INSULIN LISPRO 100 UNIT/ML CARTRIDGE: SUBCUTANEOUS | 11 refills | Status: DC

## 2018-11-25 NOTE — Telephone Encounter (Signed)
Patient calling back for humalog pens and has questions about her sleep medication.

## 2018-11-25 NOTE — Telephone Encounter (Signed)
Done hardcopy to shirron 

## 2018-11-25 NOTE — Telephone Encounter (Signed)
Patient needs a RX for the humalog quick pens to dispense the humalog cartridge. Please advise and send in she is at pharmacy now

## 2018-11-25 NOTE — Telephone Encounter (Signed)
this needs to be the quick pens not the cartridge.

## 2018-11-26 ENCOUNTER — Telehealth (HOSPITAL_COMMUNITY): Payer: Self-pay

## 2018-11-26 MED ORDER — INSULIN LISPRO (1 UNIT DIAL) 100 UNIT/ML (KWIKPEN): PEN_INJECTOR | SUBCUTANEOUS | 11 refills | Status: DC

## 2018-11-26 NOTE — Telephone Encounter (Signed)
Please clarify the "question about the sleep med" since there is no question I can see.

## 2018-11-26 NOTE — Telephone Encounter (Signed)
Please review previous message. I would like to know what to inform the pt of insulin pen when I follow up in regard to sleep med.

## 2018-11-26 NOTE — Telephone Encounter (Signed)
Dr. Jenny Reichmann please advise on the insulin pens. View prior message routed yesterday. I will contact pt in regard to sleep med.

## 2018-11-26 NOTE — Telephone Encounter (Signed)
Pt has scheduled a video visit to discuss concerns with sleeping aid medication.

## 2018-11-26 NOTE — Telephone Encounter (Signed)

## 2018-11-26 NOTE — Addendum Note (Signed)
Addended by: Biagio Borg on: 11/26/2018 01:08 PM   Modules accepted: Orders

## 2018-11-26 NOTE — Telephone Encounter (Signed)
Ok this is changed from cartridge to [employer] - done hardcopy to shirron

## 2018-11-26 NOTE — Telephone Encounter (Signed)
John, with [employer], calling to also request clarification on insulin pen.

## 2018-11-27 ENCOUNTER — Encounter (HOSPITAL_COMMUNITY): Payer: Medicare Other | Admitting: Cardiology

## 2018-11-27 ENCOUNTER — Ambulatory Visit (INDEPENDENT_AMBULATORY_CARE_PROVIDER_SITE_OTHER): Payer: Medicare Other | Admitting: Internal Medicine

## 2018-11-27 ENCOUNTER — Ambulatory Visit (HOSPITAL_COMMUNITY): Payer: Medicare Other

## 2018-11-27 DIAGNOSIS — G4700 Insomnia, unspecified: Secondary | ICD-10-CM

## 2018-11-27 DIAGNOSIS — F411 Generalized anxiety disorder: Secondary | ICD-10-CM

## 2018-11-27 DIAGNOSIS — E1140 Type 2 diabetes mellitus with diabetic neuropathy, unspecified: Secondary | ICD-10-CM

## 2018-11-27 DIAGNOSIS — E1165 Type 2 diabetes mellitus with hyperglycemia: Secondary | ICD-10-CM

## 2018-11-27 DIAGNOSIS — F13182 Sedative, hypnotic or anxiolytic abuse with sedative, hypnotic or anxiolytic-induced sleep disorder: Secondary | ICD-10-CM

## 2018-11-27 DIAGNOSIS — Z765 Malingerer [conscious simulation]: Secondary | ICD-10-CM | POA: Diagnosis not present

## 2018-11-27 DIAGNOSIS — F1910 Other psychoactive substance abuse, uncomplicated: Secondary | ICD-10-CM | POA: Diagnosis not present

## 2018-11-27 DIAGNOSIS — IMO0002 Reserved for concepts with insufficient information to code with codable children: Secondary | ICD-10-CM

## 2018-11-27 MED ORDER — QUETIAPINE FUMARATE 200 MG PO TABS: 200.0000 mg | ORAL_TABLET | Freq: Every day | ORAL | 2 refills | Status: DC
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
## 2018-11-28 ENCOUNTER — Encounter: Payer: Self-pay | Admitting: Internal Medicine

## 2018-11-28 DIAGNOSIS — F13182 Sedative, hypnotic or anxiolytic abuse with sedative, hypnotic or anxiolytic-induced sleep disorder: Secondary | ICD-10-CM | POA: Insufficient documentation

## 2018-11-28 DIAGNOSIS — Z765 Malingerer [conscious simulation]: Secondary | ICD-10-CM | POA: Insufficient documentation

## 2018-11-28 DIAGNOSIS — F1910 Other psychoactive substance abuse, uncomplicated: Secondary | ICD-10-CM | POA: Insufficient documentation

## 2018-11-28 NOTE — Assessment & Plan Note (Signed)
Declines psychiatric care at this time

## 2018-11-28 NOTE — Assessment & Plan Note (Signed)
As above, for seroquel and remeron only qhs prn

## 2018-11-28 NOTE — Assessment & Plan Note (Addendum)
D/w pt, ok for seroquel 200 qhs, but given her statements and behavior, she should be not prescribed further controlled substances for this purpose  Note:  Total time for pt hx, exam, review of record with pt in the room, determination of diagnoses and plan for further eval and tx is > 40 min, with over 50% spent in coordination and counseling of patient including the differential dx, tx, further evaluation and other management of insomnia, anxiety, abuse of multiple meds including controlled substances, and DM

## 2018-11-28 NOTE — Assessment & Plan Note (Signed)
stable overall by history and exam, recent data reviewed with pt, and pt to continue medical treatment as before,  to f/u any worsening symptoms or concerns  

## 2018-11-28 NOTE — Assessment & Plan Note (Signed)
D/w pt, I cannot support overuse of controlled substances though she asks multiple times almost begging for increased controlled substance and early refills

## 2018-11-28 NOTE — Assessment & Plan Note (Signed)
To continue only remeron 15 mg - 1 at night only as the antihistamine effect of this drug is actually increased at the lower doses

## 2018-11-28 NOTE — Patient Instructions (Signed)
Please take all new medication as prescribed - the seroquel  Please continue other treatment including remeron  I cannot support further controlled substance use including valium and ambien  Please have the pharmacy call with any other refills you may need.  Please continue your efforts at being more active, low cholesterol diet, and weight control.  Please keep your appointments with your specialists as you may have planned

## 2018-11-30 ENCOUNTER — Other Ambulatory Visit: Payer: Self-pay

## 2018-11-30 ENCOUNTER — Encounter: Payer: Self-pay | Admitting: Internal Medicine

## 2018-11-30 ENCOUNTER — Ambulatory Visit (INDEPENDENT_AMBULATORY_CARE_PROVIDER_SITE_OTHER): Payer: Medicare Other | Admitting: Internal Medicine

## 2018-11-30 DIAGNOSIS — S8012XA Contusion of left lower leg, initial encounter: Secondary | ICD-10-CM | POA: Insufficient documentation

## 2018-11-30 DIAGNOSIS — F13182 Sedative, hypnotic or anxiolytic abuse with sedative, hypnotic or anxiolytic-induced sleep disorder: Secondary | ICD-10-CM

## 2018-11-30 DIAGNOSIS — M545 Low back pain, unspecified: Secondary | ICD-10-CM

## 2018-11-30 MED ORDER — HYDROCODONE-ACETAMINOPHEN 5-325 MG PO TABS: 1.0000 | ORAL_TABLET | Freq: Four times a day (QID) | ORAL | 0 refills | Status: DC | PRN
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
## 2018-11-30 NOTE — Assessment & Plan Note (Signed)
Improved with seroquel,  to f/u any worsening symptoms or concerns

## 2018-11-30 NOTE — Patient Instructions (Signed)
Please take all new medication as prescribed - the hydrocodone for pain  Please continue all other medications as before, and refills have been done if requested.  Please have the pharmacy call with any other refills you may need.  Please continue your efforts at being more active, low cholesterol diet, and weight control  Please keep your appointments with your specialists as you may have planned  You will be contacted regarding the referral for: Chiropracter
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
## 2018-12-04 ENCOUNTER — Ambulatory Visit: Payer: Medicare Other | Admitting: Internal Medicine

## 2018-12-08 ENCOUNTER — Telehealth: Payer: Self-pay

## 2018-12-08 NOTE — Telephone Encounter (Signed)
It was faxed and sent to scan. original no longer here in office.   Copied from [place] [phone]. Topic: Quick Communication - See Telephone Encounter >> Dec 08, 2018 12:26 PM Margot Ables wrote: CRM for notification. See Telephone encounter for: 12/08/18. Following up on RX faxed in for glucose monitor and supplies for the pt. This was faxed on 12/04/2018 to [phone]. Please complete, sign, and return or call with concerns

## 2018-12-10 ENCOUNTER — Ambulatory Visit: Payer: Medicare Other | Admitting: Registered"

## 2018-12-11 ENCOUNTER — Telehealth: Payer: Self-pay | Admitting: Internal Medicine

## 2018-12-11 NOTE — Telephone Encounter (Signed)
Copied from [place] [id number]. Topic: Quick Communication - See Telephone Encounter >> Dec 08, 2018 12:26 PM Margot Ables wrote: CRM for notification. See Telephone encounter for: 12/08/18. Following up on RX faxed in for glucose monitor and supplies for the pt. This was faxed on 12/04/2018 to [phone]. Please complete, sign, and return or call with concerns >> Dec 11, 2018  1:44 PM Jodie Echevaria wrote: Suezanne Jacquet with [employer] called to check on status of form that was faxed to request an Rx for Glucometer and other diabetic supplies.

## 2018-12-11 NOTE — Telephone Encounter (Signed)
I spoke with pt in regard, she stated that they called her but she did not request any supplies and did not ask them to call PCP. Pt declines any supplies from this company.

## 2019-01-06 ENCOUNTER — Telehealth: Payer: Self-pay

## 2019-01-06 NOTE — Telephone Encounter (Signed)
Unfortunately, I have nothing further to offer.  Ok to cont the seroquel

## 2019-01-06 NOTE — Telephone Encounter (Signed)
Called pt, LVM.   

## 2019-01-06 NOTE — Telephone Encounter (Signed)
Copied from [place] [id number]. Topic: General - Other >> Jan 05, 2019  4:31 PM Mcneil, Ja-Kwan wrote: Reason for CRM: Pt stated she has been having problems with being able to rest/sleep. Pt stated she needs a Rx to be sent to her pharmacy. Informed pt that she would need to schedule an appt to be evaluated. Pt declined to have a message sent to the office to have her contacted for an appt. Pt requested that Dr. Jenny Reichmann return her call.

## 2019-01-07 ENCOUNTER — Ambulatory Visit: Payer: Medicaid Other | Admitting: Registered"

## 2019-02-17 ENCOUNTER — Telehealth: Payer: Self-pay | Admitting: Internal Medicine

## 2019-02-17 MED ORDER — INSULIN GLARGINE 100 UNIT/ML ~~LOC~~ SOLN: 22.0000 [IU] | Freq: Every day | SUBCUTANEOUS | 5 refills | Status: DC

## 2019-02-17 NOTE — Telephone Encounter (Signed)
Medication Refill - Medication: insulin glargine (LANTUS) 100 UNIT/ML injection  insulin lispro (HUMALOG KWIKPEN) 100 UNIT/ML KwikPen    Has the patient contacted their pharmacy? Yes.   (Agent: If no, request that the patient contact the pharmacy for the refill.) (Agent: If yes, when and what did the pharmacy advise?)  Preferred Pharmacy (with phone number or street name):  [id number] - [place], [address] [phone] (Phone) [phone] (Fax)     Agent: Please be advised that RX refills may take up to 3 business days. We ask that you follow-up with your pharmacy.

## 2019-02-19 ENCOUNTER — Other Ambulatory Visit: Payer: Self-pay

## 2019-02-19 ENCOUNTER — Ambulatory Visit (INDEPENDENT_AMBULATORY_CARE_PROVIDER_SITE_OTHER): Payer: Medicare Other | Admitting: Internal Medicine

## 2019-02-19 ENCOUNTER — Encounter: Payer: Self-pay | Admitting: Internal Medicine

## 2019-02-19 VITALS — BP 142/98 | HR 83 | Temp 98.6°F | Wt 208.8 lb

## 2019-02-19 DIAGNOSIS — M25511 Pain in right shoulder: Secondary | ICD-10-CM | POA: Diagnosis not present

## 2019-02-19 DIAGNOSIS — E1140 Type 2 diabetes mellitus with diabetic neuropathy, unspecified: Secondary | ICD-10-CM

## 2019-02-19 DIAGNOSIS — IMO0002 Reserved for concepts with insufficient information to code with codable children: Secondary | ICD-10-CM

## 2019-02-19 DIAGNOSIS — E1165 Type 2 diabetes mellitus with hyperglycemia: Secondary | ICD-10-CM | POA: Diagnosis not present

## 2019-02-19 DIAGNOSIS — G4700 Insomnia, unspecified: Secondary | ICD-10-CM

## 2019-02-19 DIAGNOSIS — E1100 Type 2 diabetes mellitus with hyperosmolarity without nonketotic hyperglycemic-hyperosmolar coma (NKHHC): Secondary | ICD-10-CM

## 2019-02-19 MED ORDER — ALPRAZOLAM 2 MG PO TABS: 2.0000 mg | ORAL_TABLET | Freq: Every evening | ORAL | 2 refills | Status: DC | PRN

## 2019-02-19 MED ORDER — METHYLPREDNISOLONE ACETATE 80 MG/ML IJ SUSP
80.0000 mg | Freq: Once | INTRAMUSCULAR | Status: AC
  Administered 2019-02-19: 80 mg via INTRAMUSCULAR

## 2019-02-19 MED ORDER — INSULIN GLARGINE 100 UNIT/ML ~~LOC~~ SOLN: 30.0000 [IU] | Freq: Every day | SUBCUTANEOUS | 5 refills | Status: DC

## 2019-02-19 MED ORDER — LANTUS SOLOSTAR 100 UNIT/ML ~~LOC~~ SOPN: 30.0000 [IU] | PEN_INJECTOR | Freq: Every day | SUBCUTANEOUS | 11 refills | Status: DC

## 2019-02-19 MED ORDER — HYDROCODONE-ACETAMINOPHEN 5-325 MG PO TABS: 1.0000 | ORAL_TABLET | Freq: Four times a day (QID) | ORAL | 0 refills | Status: DC | PRN

## 2019-02-19 MED ORDER — INSULIN LISPRO (1 UNIT DIAL) 100 UNIT/ML (KWIKPEN): PEN_INJECTOR | SUBCUTANEOUS | 11 refills | Status: AC

## 2019-02-19 NOTE — Patient Instructions (Signed)
You had the steroid shot today  Please take all new medication as prescribed - the pain medication  Please continue all other medications as before, and refills have been done if requested - the xanax, and lantus at 30 units, and the written prescription for the Humalog  Please have the pharmacy call with any other refills you may need.  Please continue your efforts at being more active, low cholesterol diet, and weight control.  Please keep your appointments with your specialists as you may have planned
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
## 2019-02-20 ENCOUNTER — Encounter: Payer: Self-pay | Admitting: Internal Medicine

## 2019-02-20 DIAGNOSIS — M25511 Pain in right shoulder: Secondary | ICD-10-CM | POA: Insufficient documentation

## 2019-02-20 NOTE — Assessment & Plan Note (Signed)
[place] for xanax qhs bedtime,  to f/u any worsening symptoms or concerns

## 2019-02-20 NOTE — Assessment & Plan Note (Signed)
C/w moderate to severe bursitis, for depomedrol IM 80 , hydrocodone prn pain control limited rx, and declines ortho referral for now

## 2019-02-20 NOTE — Assessment & Plan Note (Signed)
Fair control, o/w stable, refills insulin done, cont same tx, for f/u endo as planned

## 2019-02-20 NOTE — Assessment & Plan Note (Signed)
Insulin refills done, stable, for f/u endo as planned

## 2019-03-10 ENCOUNTER — Telehealth: Payer: Self-pay | Admitting: Internal Medicine

## 2019-03-10 NOTE — Telephone Encounter (Signed)
Pt went and picked up her medication for alprazolam Duanne Moron) 2 MG tablet and stated it's not helping her top  sleep. Please call to advise what she can do

## 2019-03-10 NOTE — Telephone Encounter (Signed)
Pt has tried [place], [employer], trazodone, seroquel and xanax  I have nothing else to offer, very sorry

## 2019-03-10 NOTE — Telephone Encounter (Signed)
Pt has been informed and expressed understanding.  

## 2019-03-26 ENCOUNTER — Other Ambulatory Visit: Payer: Self-pay | Admitting: *Deleted

## 2019-03-26 MED ORDER — QUETIAPINE FUMARATE 200 MG PO TABS: 200.0000 mg | ORAL_TABLET | Freq: Every day | ORAL | 2 refills | Status: DC

## 2019-04-20 ENCOUNTER — Other Ambulatory Visit: Payer: Self-pay | Admitting: Internal Medicine

## 2019-04-20 NOTE — Telephone Encounter (Signed)
Xanax too soon - should still have one refill

## 2019-04-20 NOTE — Telephone Encounter (Signed)
Requested medication (s) are due for refill today: yes  Requested medication (s) are on the active medication list: yes  Last refill:  02/19/2019  Future visit scheduled: no  Notes to clinic:  Refill cannot be delegated   Requested Prescriptions  Pending Prescriptions Disp Refills   alprazolam (XANAX) 2 MG tablet 30 tablet 2    Sig: Take 1 tablet (2 mg total) by mouth at bedtime as needed for sleep.     Not Delegated - Psychiatry:  Anxiolytics/Hypnotics Failed - 04/20/2019  1:51 PM      Failed - This refill cannot be delegated      Passed - Urine Drug Screen completed in last 360 days.      Passed - Valid encounter within last 6 months    Recent Outpatient Visits          2 months ago Acute pain of right shoulder   [place] [facility], James W, MD   4 months ago Acute bilateral low back pain without sciatica   [place] [facility], James W, MD   4 months ago Insomnia, unspecified type   [place] [facility], James W, MD   5 months ago Insomnia, unspecified type   [place] [facility], James W, MD   5 months ago Insomnia, unspecified type   [facility] -Georges Mouse, MD

## 2019-04-20 NOTE — Telephone Encounter (Signed)
Routing to CMA 

## 2019-04-20 NOTE — Telephone Encounter (Signed)
Medication Refill - Medication: ALPRAZolam Duanne Moron) tablet 1 mg      Preferred Pharmacy (with phone number or street name): [id number] - [place], [address]  [place] [address]  Phone: [phone] Fax: [phone]     Agent: Please be advised that RX refills may take up to 3 business days. We ask that you follow-up with your pharmacy.    Patient states its not 1mg  its supposed to be 5 or 10 mg and she would like a call back   Call back [phone]

## 2019-06-03 ENCOUNTER — Other Ambulatory Visit (HOSPITAL_COMMUNITY): Payer: Self-pay | Admitting: Student

## 2019-08-25 ENCOUNTER — Ambulatory Visit: Payer: Medicare Other | Admitting: Internal Medicine

## 2019-08-26 ENCOUNTER — Ambulatory Visit: Payer: Medicare Other | Admitting: Internal Medicine

## 2019-10-07 LAB — HM DIABETES EYE EXAM

## 2019-10-19 ENCOUNTER — Encounter: Payer: Self-pay | Admitting: Internal Medicine

## 2019-10-19 ENCOUNTER — Other Ambulatory Visit: Payer: Self-pay

## 2019-10-19 ENCOUNTER — Ambulatory Visit (INDEPENDENT_AMBULATORY_CARE_PROVIDER_SITE_OTHER): Payer: Medicare Other | Admitting: Internal Medicine

## 2019-10-19 VITALS — BP 160/110 | HR 103 | Temp 98.7°F | Ht 67.0 in | Wt 206.0 lb

## 2019-10-19 DIAGNOSIS — E538 Deficiency of other specified B group vitamins: Secondary | ICD-10-CM

## 2019-10-19 DIAGNOSIS — E611 Iron deficiency: Secondary | ICD-10-CM

## 2019-10-19 DIAGNOSIS — E559 Vitamin D deficiency, unspecified: Secondary | ICD-10-CM

## 2019-10-19 DIAGNOSIS — I10 Essential (primary) hypertension: Secondary | ICD-10-CM | POA: Diagnosis not present

## 2019-10-19 DIAGNOSIS — G43909 Migraine, unspecified, not intractable, without status migrainosus: Secondary | ICD-10-CM

## 2019-10-19 DIAGNOSIS — Z0000 Encounter for general adult medical examination without abnormal findings: Secondary | ICD-10-CM | POA: Diagnosis not present

## 2019-10-19 DIAGNOSIS — E1165 Type 2 diabetes mellitus with hyperglycemia: Secondary | ICD-10-CM | POA: Diagnosis not present

## 2019-10-19 DIAGNOSIS — E1140 Type 2 diabetes mellitus with diabetic neuropathy, unspecified: Secondary | ICD-10-CM | POA: Diagnosis not present

## 2019-10-19 DIAGNOSIS — G4700 Insomnia, unspecified: Secondary | ICD-10-CM | POA: Diagnosis not present

## 2019-10-19 DIAGNOSIS — IMO0002 Reserved for concepts with insufficient information to code with codable children: Secondary | ICD-10-CM

## 2019-10-19 LAB — BASIC METABOLIC PANEL
BUN: 6 mg/dL (ref 6–23)
CO2: 31 mEq/L (ref 19–32)
Calcium: 9.7 mg/dL (ref 8.4–10.5)
Chloride: 96 mEq/L (ref 96–112)
Creatinine, Ser: 0.69 mg/dL (ref 0.40–1.20)
GFR: 101.97 mL/min (ref >60.00)
Glucose, Bld: 251 mg/dL — ABNORMAL HIGH (ref 70–99)
Potassium: 3.6 mEq/L (ref 3.5–5.1)
Sodium: 133 mEq/L — ABNORMAL LOW (ref 135–145)

## 2019-10-19 LAB — CBC WITH DIFFERENTIAL/PLATELET
Basophils Absolute: 0.1 10*3/uL (ref 0.0–0.1)
Basophils Relative: 0.7 % (ref 0.0–3.0)
Eosinophils Absolute: 0.4 10*3/uL (ref 0.0–0.7)
Eosinophils Relative: 3 % (ref 0.0–5.0)
HCT: 37.7 % (ref 36.0–46.0)
Hemoglobin: 12.5 g/dL (ref 12.0–15.0)
Lymphocytes Relative: 11.5 % — ABNORMAL LOW (ref 12.0–46.0)
Lymphs Abs: 1.6 10*3/uL (ref 0.7–4.0)
MCHC: 33 g/dL (ref 30.0–36.0)
MCV: 83.3 fl (ref 78.0–100.0)
Monocytes Absolute: 0.5 10*3/uL (ref 0.1–1.0)
Monocytes Relative: 3.6 % (ref 3.0–12.0)
Neutro Abs: 11.5 10*3/uL — ABNORMAL HIGH (ref 1.4–7.7)
Neutrophils Relative %: 81.2 % — ABNORMAL HIGH (ref 43.0–77.0)
Platelets: 351 10*3/uL (ref 150.0–400.0)
RBC: 4.53 Mil/uL (ref 3.87–5.11)
RDW: 14.5 % (ref 11.5–15.5)
WBC: 14.1 10*3/uL — ABNORMAL HIGH (ref 4.0–10.5)

## 2019-10-19 LAB — LIPID PANEL
Cholesterol: 137 mg/dL (ref 0–200)
HDL: 36.6 mg/dL — ABNORMAL LOW (ref >39.00)
NonHDL: 100.47
Total CHOL/HDL Ratio: 4
Triglycerides: 208 mg/dL — ABNORMAL HIGH (ref 0.0–149.0)
VLDL: 41.6 mg/dL — ABNORMAL HIGH (ref 0.0–40.0)

## 2019-10-19 LAB — HEPATIC FUNCTION PANEL
ALT: 15 U/L (ref 0–35)
AST: 18 U/L (ref 0–37)
Albumin: 4.1 g/dL (ref 3.5–5.2)
Alkaline Phosphatase: 159 U/L — ABNORMAL HIGH (ref 39–117)
Bilirubin, Direct: 0.1 mg/dL (ref 0.0–0.3)
Total Bilirubin: 0.4 mg/dL (ref 0.2–1.2)
Total Protein: 8.7 g/dL — ABNORMAL HIGH (ref 6.0–8.3)

## 2019-10-19 LAB — MICROALBUMIN / CREATININE URINE RATIO
Creatinine,U: 114.5 mg/dL
Microalb Creat Ratio: 13 mg/g (ref 0.0–30.0)
Microalb, Ur: 14.9 mg/dL — ABNORMAL HIGH (ref 0.0–1.9)

## 2019-10-19 LAB — IBC PANEL
Iron: 39 ug/dL — ABNORMAL LOW (ref 42–145)
Saturation Ratios: 13.6 % — ABNORMAL LOW (ref 20.0–50.0)
Transferrin: 205 mg/dL — ABNORMAL LOW (ref 212.0–360.0)

## 2019-10-19 LAB — TSH: TSH: 5.13 u[IU]/mL — ABNORMAL HIGH (ref 0.35–4.50)

## 2019-10-19 LAB — LDL CHOLESTEROL, DIRECT: Direct LDL: 73 mg/dL

## 2019-10-19 LAB — VITAMIN D 25 HYDROXY (VIT D DEFICIENCY, FRACTURES): VITD: 18.57 ng/mL — ABNORMAL LOW (ref 30.00–100.00)

## 2019-10-19 LAB — VITAMIN B12: Vitamin B-12: 1378 pg/mL — ABNORMAL HIGH (ref 211–911)

## 2019-10-19 LAB — HEMOGLOBIN A1C: Hgb A1c MFr Bld: 7.9 % — ABNORMAL HIGH (ref 4.6–6.5)

## 2019-10-19 MED ORDER — LOSARTAN POTASSIUM 100 MG PO TABS: 100.0000 mg | ORAL_TABLET | Freq: Every day | ORAL | 3 refills | Status: AC

## 2019-10-19 MED ORDER — HYDROCODONE-ACETAMINOPHEN 5-325 MG PO TABS: 1.0000 | ORAL_TABLET | Freq: Four times a day (QID) | ORAL | 0 refills | Status: AC | PRN

## 2019-10-19 MED ORDER — ALPRAZOLAM 2 MG PO TABS: 2.0000 mg | ORAL_TABLET | Freq: Every evening | ORAL | 5 refills | Status: AC | PRN

## 2019-10-19 NOTE — Assessment & Plan Note (Signed)
stable overall by history and exam, recent data reviewed with pt, and pt to continue medical treatment as before,  to f/u any worsening symptoms or concerns  

## 2019-10-19 NOTE — Patient Instructions (Addendum)
Please take all new medication as prescribed - the losartan 100 mg per day for blood pressure  Please continue all other medications as before, and refills have been done if requested - the alprazolam, and hydrocodone  Please have the pharmacy call with any other refills you may need.  Please continue your efforts at being more active, low cholesterol diet, and weight control.  You are otherwise up to date with prevention measures today.  Please keep your appointments with your specialists as you may have planned  Please go to the LAB at the blood drawing area for the tests to be done  You will be contacted by phone if any changes need to be made immediately.  Otherwise, you will receive a letter about your results with an explanation, but please check with MyChart first.  Please remember to sign up for MyChart if you have not done so, as this will be important to you in the future with finding out test results, communicating by private email, and scheduling acute appointments online when needed.  Please make an Appointment to return in 6 months, or sooner if needed

## 2019-10-19 NOTE — Assessment & Plan Note (Signed)
For pain control refill °

## 2019-10-19 NOTE — Assessment & Plan Note (Signed)
For xanax refill

## 2019-10-19 NOTE — Progress Notes (Signed)
Subjective:    Patient ID: Gloria [name], female    DOB: 10-29-1950, 69 y.o.   MRN: [id number]  HPI  Here for wellness and f/u;  Overall doing ok;  Pt denies Chest pain, worsening SOB, DOE, wheezing, orthopnea, PND, worsening LE edema, palpitations, dizziness or syncope.  Pt denies neurological change such as new headache, facial or extremity weakness.  Pt denies polydipsia, polyuria, or low sugar symptoms. Pt states overall good compliance with treatment and medications, good tolerability, and has been trying to follow appropriate diet.  Pt denies worsening depressive symptoms, suicidal ideation or panic. No fever, night sweats, wt loss, loss of appetite, or other constitutional symptoms.  Pt states good ability with ADL's, has low fall risk, home safety reviewed and adequate, no other significant changes in hearing or vision,   Also with multiple migraine HA recent worse in the past 2 wks, asks for hydrocodone prn refill.  Also asks for xanax 10 mg convinced she had this in the past. Not taking most of her current med list, but is willing to take the losartan Past Medical History:  Diagnosis Date  . Allergic rhinitis, cause unspecified 03/30/2013  . Anxiety state, unspecified   . Arthritis    "knees" (05/01/2018)  . Asthma    hx (05/01/2018)  . CHF (congestive heart failure) ([place])   . Chronic bronchitis   . Depressive disorder, not elsewhere classified   . Difficulty waking    hard to wake up past sedation!  . Migraine, unspecified, without mention of intractable migraine without mention of status migrainosus   . OSA (obstructive sleep apnea) 12/25/2016  . Other and unspecified hyperlipidemia   . Panic attacks   . Postmenopausal symptoms   . Type 2 diabetes, diet controlled ([place])   . Unspecified essential hypertension   . Unspecified urinary incontinence   . UTI (lower urinary tract infection)    on 02/05/16/on meds   Past Surgical History:  Procedure Laterality Date  . ABDOMINAL  HYSTERECTOMY  2002  . DIAGNOSTIC LAPAROSCOPY    . IR PARACENTESIS  05/08/2018  . OPEN REDUCTION SHOULDER DISLOCATION Right   . RIGHT/LEFT HEART CATH AND CORONARY ANGIOGRAPHY N/A 05/05/2018   Procedure: RIGHT/LEFT HEART CATH AND CORONARY ANGIOGRAPHY;  Surgeon: Belva Crome, MD;  Location: [facility];  Service: Cardiovascular;  Laterality: N/A;  . TUBAL LIGATION    . TUMOR EXCISION Right    "ankle"    reports that she has never smoked. She has never used smokeless tobacco. She reports that she does not drink alcohol or use drugs. family history includes Arthritis in an other family member; Asthma in her son; Colon cancer in her maternal grandfather; Diabetes in her maternal grandmother, maternal uncle, and paternal uncle; Hypertension in her mother and another family member; Rheum arthritis in her mother. Allergies  Allergen Reactions  . Compazine Other (See Comments)    Stroke-like symptoms  . Glipizide Shortness Of Breath and Other (See Comments)    Dyspnea   . Haloperidol Decanoate Other (See Comments)    Extrapyramidal syndrome  . Penicillins Nausea And Vomiting    Did it involve swelling of the face/tongue/throat, SOB, or low BP? No, N and V Did it involve sudden or severe rash/hives, skin peeling, or any reaction on the inside of your mouth or nose? No Did you need to seek medical attention at a hospital or doctor's office? No When did it last happen? "a long time ago" If all above answers  are "NO", may proceed with cephalosporin use.   . Metformin And Related Nausea Only and Other (See Comments)    GI upset  . Codeine Itching  . Lisinopril Cough   Current Outpatient Medications on File Prior to Visit  Medication Sig Dispense Refill  . camphor-menthol (SARNA) lotion Apply 1 application topically 2 (two) times daily. 222 mL 0  . glucose blood test strip Use as instructed four times per day E11.9 400 each 12  . glucose monitoring kit (FREESTYLE) monitoring kit Use as  directed E11.9 1 each 0  . Insulin Glargine (LANTUS SOLOSTAR) 100 UNIT/ML Solostar Pen Inject 30 Units into the skin daily. 5 pen 11  . insulin glargine (LANTUS) 100 UNIT/ML injection Inject 0.3 mLs (30 Units total) into the skin at bedtime. 10 mL 5  . insulin lispro (HUMALOG KWIKPEN) 100 UNIT/ML KwikPen CBG < 70: implement hypoglycemia protocol CBG 70 - 120: 0 units CBG 121 - 150: 1 unit CBG 151 - 200: 2 units CBG 201 - 250: 3 units CBG 251 - 300: 5 units CBG 301 - 350: 7 units CBG 351 - 400: 9 units CBG > 400: call MD. 15 mL 11  . Insulin Pen Needle (BD PEN NEEDLE NANO U/F) 32G X 4 MM MISC USE ONCE DAILY E11.9 100 each 3  . Insulin Syringe-Needle U-100 25G X 1" 1 ML MISC For 4 times a day insulin SQ, 1 month supply. Diagnosis E11.65 30 each 0  . Lancets MISC Use as directed four times per day E11.9 400 each 3   No current facility-administered medications on file prior to visit.   Review of Systems All otherwise neg per pt     Objective:   Physical Exam BP (!) 160/110 (BP Location: Left Arm, Patient Position: Sitting, Cuff Size: Large)   Pulse (!) 103   Temp 98.7 F (37.1 C) (Oral)   Ht _0  (1.702 m)   Wt 206 lb (93.4 kg)   SpO2 97%   BMI 32.26 kg/m  VS noted,  Constitutional: Pt appears in NAD HENT: Head: NCAT.  Right Ear: External ear normal.  Left Ear: External ear normal.  Eyes: . Pupils are equal, round, and reactive to light. Conjunctivae and EOM are normal Nose: without d/c or deformity Neck: Neck supple. Gross normal ROM Cardiovascular: Normal rate and regular rhythm.   Pulmonary/Chest: Effort normal and breath sounds without rales or wheezing.  Abd:  Soft, NT, ND, + BS, no organomegaly Neurological: Pt is alert. At baseline orientation, motor grossly intact Skin: Skin is warm. No rashes, other new lesions, no LE edema Psychiatric: Pt behavior is normal without agitation  All otherwise neg per pt Lab Results  Component Value Date   WBC 7.7 08/21/2018    HGB 10.0 (L) 08/21/2018   HCT 30.5 (L) 08/21/2018   PLT 204 08/21/2018   GLUCOSE 129 (H) 08/19/2018   CHOL 156 07/19/2018   TRIG 187 (H) 07/19/2018   HDL 39 (L) 07/19/2018   LDLDIRECT 64.0 10/03/2015   LDLCALC 80 07/19/2018   ALT 350 (H) 08/24/2018   AST 305 (H) 08/24/2018   NA 139 08/19/2018   K 3.8 08/19/2018   CL 107 08/19/2018   CREATININE 0.99 08/19/2018   BUN 18 08/19/2018   CO2 21 (L) 08/19/2018   TSH 4.24 02/25/2018   INR 1.02 08/18/2018   HGBA1C 10.0 (H) 07/18/2018   MICROALBUR 6.4 (H) 02/25/2018      Assessment & Plan:

## 2019-10-19 NOTE — Assessment & Plan Note (Signed)

## 2019-10-19 NOTE — Assessment & Plan Note (Signed)
To start losartan 100 qd

## 2019-10-20 ENCOUNTER — Encounter: Payer: Self-pay | Admitting: Internal Medicine

## 2019-10-20 ENCOUNTER — Other Ambulatory Visit: Payer: Self-pay | Admitting: Internal Medicine

## 2019-10-20 LAB — URINALYSIS, ROUTINE W REFLEX MICROSCOPIC
Bilirubin Urine: NEGATIVE
Ketones, ur: NEGATIVE
Leukocytes,Ua: NEGATIVE
Nitrite: POSITIVE — AB
RBC / HPF: NONE SEEN (ref 0–?)
Specific Gravity, Urine: 1.015 (ref 1.000–1.030)
Total Protein, Urine: 30 — AB
Urine Glucose: >=1000 — AB
Urobilinogen, UA: 0.2 (ref 0.0–1.0)
pH: 6.5 (ref 5.0–8.0)

## 2019-10-20 MED ORDER — LANTUS SOLOSTAR 100 UNIT/ML ~~LOC~~ SOPN: 35.0000 [IU] | PEN_INJECTOR | Freq: Every day | SUBCUTANEOUS | 11 refills | Status: AC

## 2019-10-20 MED ORDER — VITAMIN D (ERGOCALCIFEROL) 1.25 MG (50000 UNIT) PO CAPS: 50000.0000 [IU] | ORAL_CAPSULE | ORAL | 0 refills | Status: AC

## 2019-11-24 ENCOUNTER — Telehealth: Payer: Self-pay

## 2019-11-24 NOTE — Telephone Encounter (Signed)
New message    The patient passed away on  12/11/19 @ 12:22 am   Was told that Dr. Jenny Reichmann would sign the death certificate  [facility] is aware Dr. Jenny Reichmann is out of the office until tomorrow.

## 2019-11-26 NOTE — Telephone Encounter (Signed)
[place] but after today I will be on vacation for 1 wk

## 2019-11-26 NOTE — Telephone Encounter (Signed)
F/u    Gloria [name] from [facility] home calling back on a sign  death certificate

## 2019-11-30 DEATH — deceased

## 2019-12-06 NOTE — Telephone Encounter (Signed)
[place] dropped off DC. Placed in MDs box to be signed. Thanks.

## 2019-12-09 NOTE — Telephone Encounter (Signed)
Death certificate signed by MD. [facility] notified for pick up and medical records notified to mark chart deceased.Thanks.

## 2020-10-20 IMAGING — CR DG CHEST 2V
2 series · 2 of 2 positions shown · non-contrast
Comparison: 05/01/2018

CLINICAL DATA: Chest pain

EXAM:
CHEST - 2 VIEW

[chest lat]
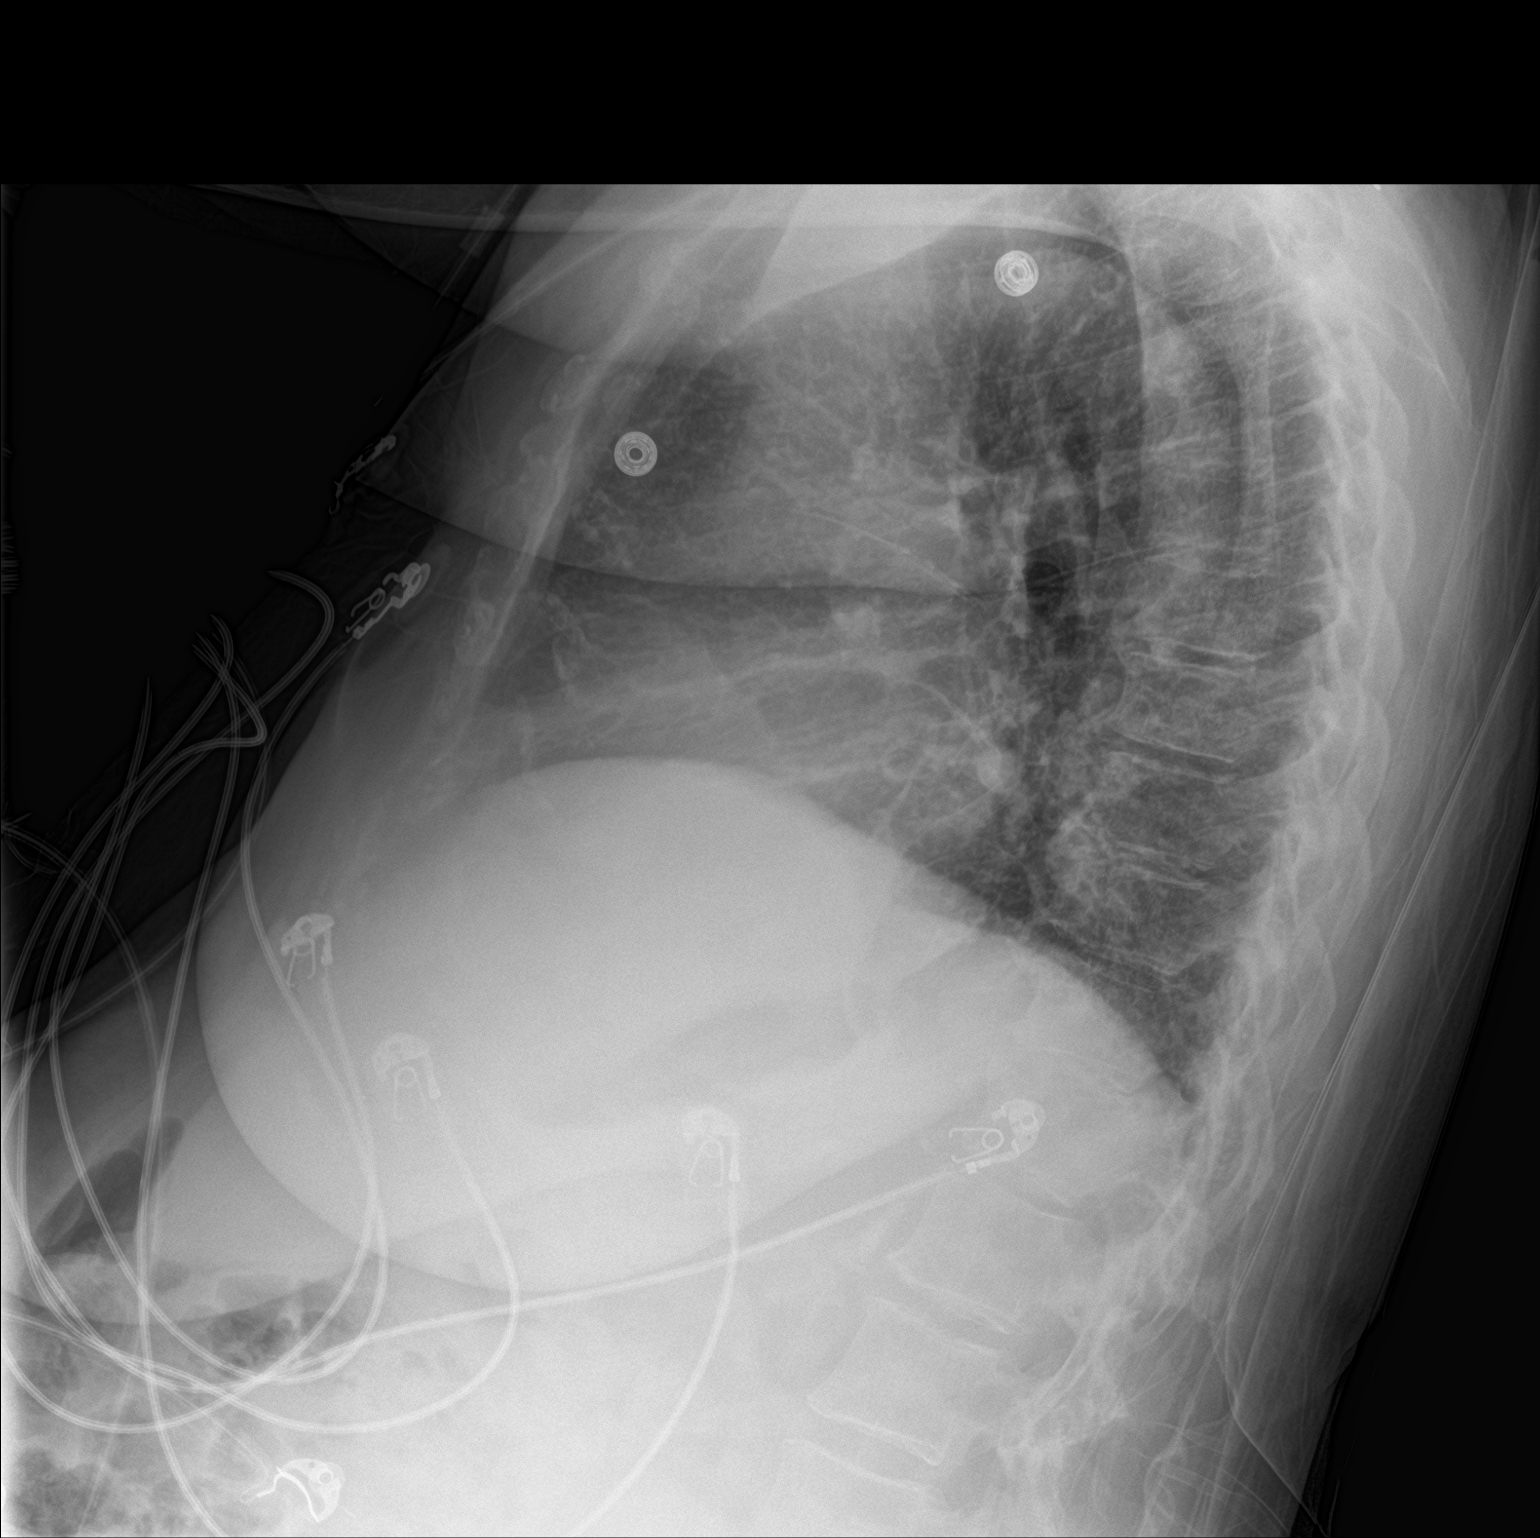

[chest ap]
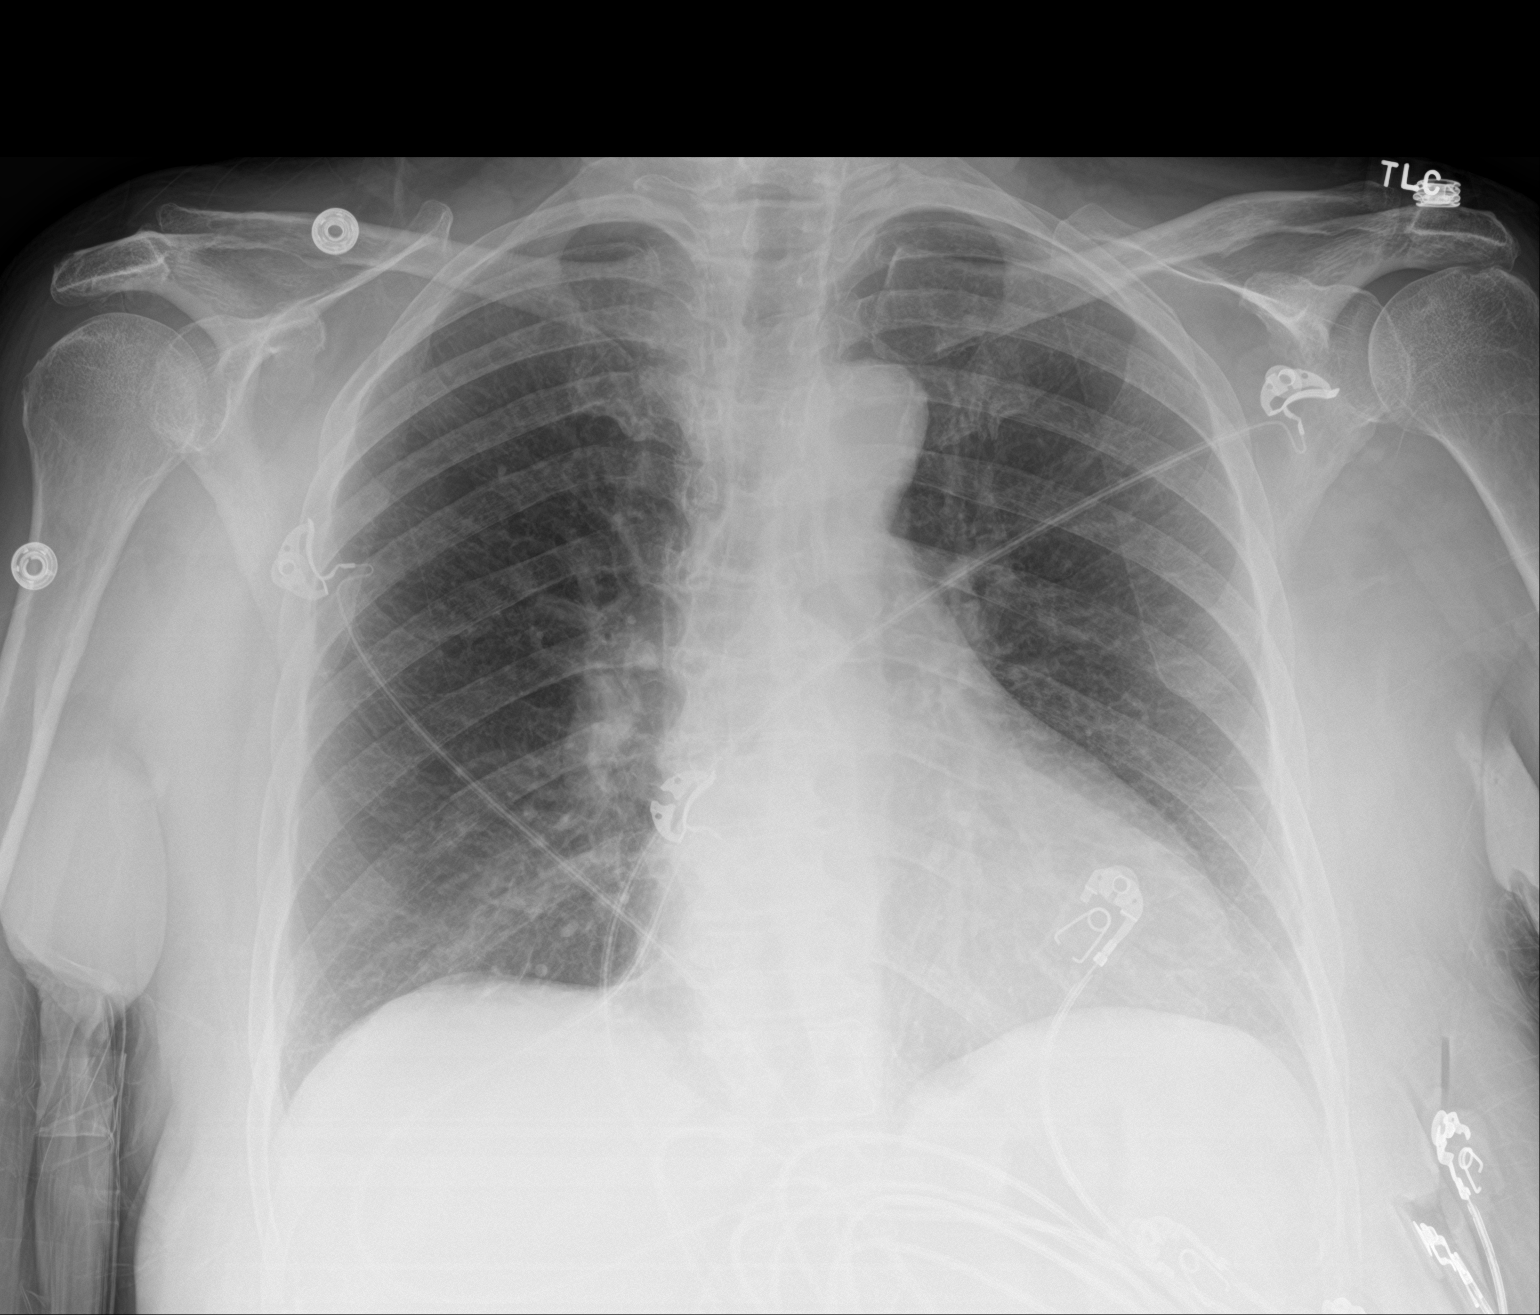

[2 of 2 positions shown; findings below may reference images not displayed]

FINDINGS: The heart size and mediastinal contours are within normal limits.
Both lungs are clear. The visualized skeletal structures are
unremarkable.
IMPRESSION: No active cardiopulmonary disease.
